# Patient Record
Sex: Male | Born: 1950 | Race: White | Hispanic: No | Marital: Married | Smoking: Current every day smoker
Health system: Southern US, Community
[De-identification: ages and names within clinical notes are randomized; demographics above are authoritative.]

## PROBLEM LIST (undated history)

## (undated) DIAGNOSIS — Z8679 Personal history of other diseases of the circulatory system: Secondary | ICD-10-CM

## (undated) DIAGNOSIS — Z95820 Peripheral vascular angioplasty status with implants and grafts: Secondary | ICD-10-CM

## (undated) DIAGNOSIS — N400 Enlarged prostate without lower urinary tract symptoms: Secondary | ICD-10-CM

## (undated) DIAGNOSIS — T4145XA Adverse effect of unspecified anesthetic, initial encounter: Secondary | ICD-10-CM

## (undated) DIAGNOSIS — C7931 Secondary malignant neoplasm of brain: Secondary | ICD-10-CM

## (undated) DIAGNOSIS — Z923 Personal history of irradiation: Secondary | ICD-10-CM

## (undated) DIAGNOSIS — I639 Cerebral infarction, unspecified: Secondary | ICD-10-CM

## (undated) DIAGNOSIS — D510 Vitamin B12 deficiency anemia due to intrinsic factor deficiency: Secondary | ICD-10-CM

## (undated) DIAGNOSIS — C3490 Malignant neoplasm of unspecified part of unspecified bronchus or lung: Secondary | ICD-10-CM

## (undated) DIAGNOSIS — N529 Male erectile dysfunction, unspecified: Secondary | ICD-10-CM

## (undated) DIAGNOSIS — E785 Hyperlipidemia, unspecified: Secondary | ICD-10-CM

## (undated) DIAGNOSIS — I739 Peripheral vascular disease, unspecified: Secondary | ICD-10-CM

## (undated) DIAGNOSIS — R0602 Shortness of breath: Secondary | ICD-10-CM

## (undated) DIAGNOSIS — J189 Pneumonia, unspecified organism: Secondary | ICD-10-CM

## (undated) DIAGNOSIS — J449 Chronic obstructive pulmonary disease, unspecified: Secondary | ICD-10-CM

## (undated) DIAGNOSIS — I10 Essential (primary) hypertension: Secondary | ICD-10-CM

## (undated) DIAGNOSIS — E349 Endocrine disorder, unspecified: Secondary | ICD-10-CM

## (undated) DIAGNOSIS — T8859XA Other complications of anesthesia, initial encounter: Secondary | ICD-10-CM

## (undated) HISTORY — DX: Personal history of other diseases of the circulatory system: Z86.79

## (undated) HISTORY — DX: Male erectile dysfunction, unspecified: N52.9

## (undated) HISTORY — DX: Chronic obstructive pulmonary disease, unspecified: J44.9

## (undated) HISTORY — DX: Hyperlipidemia, unspecified: E78.5

## (undated) HISTORY — PX: CARDIAC CATHETERIZATION: SHX172

## (undated) HISTORY — DX: Endocrine disorder, unspecified: E34.9

## (undated) HISTORY — PX: PERIPHERAL ARTERIAL STENT GRAFT: SHX2220

## (undated) HISTORY — DX: Benign prostatic hyperplasia without lower urinary tract symptoms: N40.0

## (undated) HISTORY — DX: Essential (primary) hypertension: I10

## (undated) HISTORY — PX: COLONOSCOPY: SHX174

---

## 1978-09-17 HISTORY — PX: CERVICAL FUSION: SHX112

## 1994-01-16 DIAGNOSIS — I639 Cerebral infarction, unspecified: Secondary | ICD-10-CM

## 1994-01-16 HISTORY — DX: Cerebral infarction, unspecified: I63.9

## 2003-06-05 ENCOUNTER — Ambulatory Visit (HOSPITAL_COMMUNITY): Admission: RE | Admit: 2003-06-05 | Discharge: 2003-06-05 | Payer: Self-pay | Admitting: Neurology

## 2004-11-22 ENCOUNTER — Encounter: Admission: RE | Admit: 2004-11-22 | Discharge: 2004-11-22 | Payer: Self-pay | Admitting: Family Medicine

## 2004-12-01 ENCOUNTER — Encounter: Payer: Self-pay | Admitting: Family Medicine

## 2005-01-03 ENCOUNTER — Ambulatory Visit: Admission: RE | Admit: 2005-01-03 | Discharge: 2005-01-03 | Payer: Self-pay | Admitting: Interventional Radiology

## 2005-01-16 HISTORY — PX: ANEURYSM COILING: SHX5349

## 2005-02-16 ENCOUNTER — Ambulatory Visit: Payer: Self-pay | Admitting: Pulmonary Disease

## 2005-02-16 ENCOUNTER — Ambulatory Visit (HOSPITAL_COMMUNITY): Admission: RE | Admit: 2005-02-16 | Discharge: 2005-02-17 | Payer: Self-pay | Admitting: Interventional Radiology

## 2005-03-02 ENCOUNTER — Encounter: Payer: Self-pay | Admitting: Interventional Radiology

## 2005-08-11 ENCOUNTER — Ambulatory Visit (HOSPITAL_COMMUNITY): Admission: RE | Admit: 2005-08-11 | Discharge: 2005-08-11 | Payer: Self-pay | Admitting: Interventional Radiology

## 2006-05-17 ENCOUNTER — Emergency Department (HOSPITAL_COMMUNITY): Admission: EM | Admit: 2006-05-17 | Discharge: 2006-05-17 | Payer: Self-pay | Admitting: Emergency Medicine

## 2006-06-08 ENCOUNTER — Inpatient Hospital Stay (HOSPITAL_COMMUNITY): Admission: EM | Admit: 2006-06-08 | Discharge: 2006-06-10 | Payer: Self-pay | Admitting: Emergency Medicine

## 2006-07-26 ENCOUNTER — Emergency Department (HOSPITAL_COMMUNITY): Admission: EM | Admit: 2006-07-26 | Discharge: 2006-07-26 | Payer: Self-pay | Admitting: Emergency Medicine

## 2006-08-13 ENCOUNTER — Emergency Department (HOSPITAL_COMMUNITY): Admission: EM | Admit: 2006-08-13 | Discharge: 2006-08-13 | Payer: Self-pay | Admitting: Emergency Medicine

## 2006-10-08 ENCOUNTER — Emergency Department (HOSPITAL_COMMUNITY): Admission: EM | Admit: 2006-10-08 | Discharge: 2006-10-08 | Payer: Self-pay | Admitting: Emergency Medicine

## 2006-10-08 ENCOUNTER — Encounter (INDEPENDENT_AMBULATORY_CARE_PROVIDER_SITE_OTHER): Payer: Self-pay | Admitting: Emergency Medicine

## 2007-10-27 ENCOUNTER — Emergency Department (HOSPITAL_COMMUNITY): Admission: EM | Admit: 2007-10-27 | Discharge: 2007-10-27 | Payer: Self-pay | Admitting: Emergency Medicine

## 2009-08-20 ENCOUNTER — Encounter: Payer: Self-pay | Admitting: Internal Medicine

## 2009-09-09 ENCOUNTER — Ambulatory Visit: Payer: Self-pay | Admitting: Oncology

## 2009-09-14 ENCOUNTER — Ambulatory Visit (HOSPITAL_COMMUNITY): Admission: RE | Admit: 2009-09-14 | Discharge: 2009-09-14 | Payer: Self-pay | Admitting: Oncology

## 2009-09-14 LAB — CBC WITH DIFFERENTIAL/PLATELET
BASO%: 2 % (ref 0.0–2.0)
Basophils Absolute: 0.2 10*3/uL — ABNORMAL HIGH (ref 0.0–0.1)
EOS%: 6.5 % (ref 0.0–7.0)
Eosinophils Absolute: 0.5 10*3/uL (ref 0.0–0.5)
HCT: 44.7 % (ref 38.4–49.9)
HGB: 14.8 g/dL (ref 13.0–17.1)
LYMPH%: 34.2 % (ref 14.0–49.0)
MCH: 30.7 pg (ref 27.2–33.4)
MCHC: 33.1 g/dL (ref 32.0–36.0)
MCV: 92.5 fL (ref 79.3–98.0)
MONO#: 0.6 10*3/uL (ref 0.1–0.9)
MONO%: 8.4 % (ref 0.0–14.0)
NEUT#: 3.7 10*3/uL (ref 1.5–6.5)
NEUT%: 48.9 % (ref 39.0–75.0)
Platelets: 492 10*3/uL — ABNORMAL HIGH (ref 140–400)
RBC: 4.83 10*6/uL (ref 4.20–5.82)
RDW: 14.9 % — ABNORMAL HIGH (ref 11.0–14.6)
WBC: 7.5 10*3/uL (ref 4.0–10.3)
lymph#: 2.6 10*3/uL (ref 0.9–3.3)

## 2009-09-14 LAB — COMPREHENSIVE METABOLIC PANEL
ALT: <8 U/L (ref 0–53)
AST: 12 U/L (ref 0–37)
Calcium: 8.3 mg/dL — ABNORMAL LOW (ref 8.4–10.5)
Chloride: 100 mEq/L (ref 96–112)
Creatinine, Ser: 1.44 mg/dL (ref 0.40–1.50)
Potassium: 5 mEq/L (ref 3.5–5.3)

## 2009-09-14 LAB — CHCC SMEAR

## 2009-09-17 LAB — JAK-2 V617F

## 2009-11-12 ENCOUNTER — Ambulatory Visit: Payer: Self-pay | Admitting: Oncology

## 2009-11-16 LAB — CBC WITH DIFFERENTIAL/PLATELET
BASO%: 2.2 % — ABNORMAL HIGH (ref 0.0–2.0)
EOS%: 7.9 % — ABNORMAL HIGH (ref 0.0–7.0)
HCT: 44 % (ref 38.4–49.9)
LYMPH%: 29.7 % (ref 14.0–49.0)
MCH: 30.9 pg (ref 27.2–33.4)
MCHC: 33.8 g/dL (ref 32.0–36.0)
MONO%: 10.7 % (ref 0.0–14.0)
NEUT%: 49.5 % (ref 39.0–75.0)
lymph#: 2.3 10*3/uL (ref 0.9–3.3)

## 2009-12-24 ENCOUNTER — Ambulatory Visit: Payer: Self-pay | Admitting: Internal Medicine

## 2009-12-24 DIAGNOSIS — J449 Chronic obstructive pulmonary disease, unspecified: Secondary | ICD-10-CM

## 2009-12-24 DIAGNOSIS — N400 Enlarged prostate without lower urinary tract symptoms: Secondary | ICD-10-CM

## 2009-12-24 DIAGNOSIS — J4489 Other specified chronic obstructive pulmonary disease: Secondary | ICD-10-CM

## 2009-12-24 DIAGNOSIS — E785 Hyperlipidemia, unspecified: Secondary | ICD-10-CM

## 2009-12-24 DIAGNOSIS — I10 Essential (primary) hypertension: Secondary | ICD-10-CM

## 2009-12-24 DIAGNOSIS — Z8679 Personal history of other diseases of the circulatory system: Secondary | ICD-10-CM

## 2009-12-24 HISTORY — DX: Personal history of other diseases of the circulatory system: Z86.79

## 2009-12-24 HISTORY — DX: Hyperlipidemia, unspecified: E78.5

## 2009-12-24 HISTORY — DX: Chronic obstructive pulmonary disease, unspecified: J44.9

## 2009-12-24 HISTORY — DX: Essential (primary) hypertension: I10

## 2009-12-24 HISTORY — DX: Benign prostatic hyperplasia without lower urinary tract symptoms: N40.0

## 2009-12-24 HISTORY — DX: Other specified chronic obstructive pulmonary disease: J44.89

## 2009-12-28 ENCOUNTER — Encounter: Payer: Self-pay | Admitting: Internal Medicine

## 2010-02-05 ENCOUNTER — Encounter: Payer: Self-pay | Admitting: Interventional Radiology

## 2010-02-06 ENCOUNTER — Encounter: Payer: Self-pay | Admitting: Interventional Radiology

## 2010-02-15 NOTE — Assessment & Plan Note (Signed)
Summary: new to est//ccm   Vital Signs:  Patient profile:   60 year old male Height:      66 inches Weight:      152 pounds BMI:     24.62 Temp:     98.1 degrees F oral BP sitting:   90 / 60  (right arm)  Vitals Entered By: Duard Brady LPN (December 24, 2009 2:37 PM) CC: new to establish - doing ok  Is Patient Diabetic? No   CC:  new to establish - doing ok .  History of Present Illness: 60 year old patient who is seen today as this with our practice.  He is treated hypertension, dyslipidemia, and COPD.  He has a peripheral neuropathy, as well as disability from a stroke in 1996.  He has chronic right-sided weakness and paresthesias.  Here for Medicare AWV:  1.   Risk factors based on Past M, S, F history:  cardiovascular risk factors include hypertension, and dyslipidemia 2.   Physical Activities: patient is disabled, but has reloadable physical limitations.  He has chronic right-sided mild hemiparesis 3.   Depression/mood: no history of depression or mood disorder 4.   Hearing: no deficits 5.   ADL's: independent in all aspects of daily living 6.   Fall Risk: low 7.   Home Safety: no problems identified 8.   Height, weight, &visual acuity:height and weight are stable.  No change in visual acuity 9.   Counseling: heart healthy diet.  Encouraged 10.   Labs ordered based on risk factors: laboratory studies will be checked fasting.  Next office visit 11.           Referral Coordination- follow-up hematology for his reactive  thrombocytosis 12.           Care Plan-more frequent and aggressive exercise regimen encouraged 13.            Cognitive Assessment- alert and oriented normal affect.  No memory disturbance handles all executive functioning without difficulty   Preventive Screening-Counseling & Management  Alcohol-Tobacco     Smoking Status: current  Allergies (verified): No Known Drug Allergies  Past History:  Past Medical History: COPD Hyperlipidemia  (hypertriglyceridemia) Hypertension Benign prostatic hypertrophy Cerebrovascular accident, hx of 1996 with mild residual right-sided symptoms history of cerebral aneurysm, status post coiling 2007 peripheral neuropathy testosterone deficiency/ED   pernicious anemia history of orthostatic hypotension history of peripheral vascular disease essential thrombocythemia  Past Surgical History: cervical fusion stenting and coiling of a cerebral aneurysm colonoscopy 2010   Family History: Reviewed history and no changes required. father died of stomach cancer one brother died of a 10.  Metastatic cancer, unclear primary  Social History: Reviewed history and no changes required. Married Current Smoker 11 th grade education disabled since 50 married two sons, one daughter no alcohol useSmoking Status:  current  Review of Systems  The patient denies anorexia, fever, weight loss, weight gain, vision loss, decreased hearing, hoarseness, chest pain, syncope, dyspnea on exertion, peripheral edema, prolonged cough, headaches, hemoptysis, abdominal pain, melena, hematochezia, severe indigestion/heartburn, hematuria, incontinence, genital sores, muscle weakness, suspicious skin lesions, transient blindness, difficulty walking, depression, unusual weight change, abnormal bleeding, enlarged lymph nodes, angioedema, breast masses, and testicular masses.    Physical Exam  General:  Well-developed,well-nourished,in no acute distress; alert,appropriate and cooperative throughout examination; blood pressure 100/70 Head:  Normocephalic and atraumatic without obvious abnormalities. No apparent alopecia or balding. Eyes:  No corneal or conjunctival inflammation noted. EOMI. Perrla. Funduscopic exam benign, without hemorrhages,  exudates or papilledema. Vision grossly normal. Ears:  External ear exam shows no significant lesions or deformities.  Otoscopic examination reveals clear canals, tympanic  membranes are intact bilaterally without bulging, retraction, inflammation or discharge. Hearing is grossly normal bilaterally. Nose:  External nasal examination shows no deformity or inflammation. Nasal mucosa are pink and moist without lesions or exudates. Mouth:  Oral mucosa and oropharynx without lesions or exudates.  Teeth in good repair. Neck:  No deformities, masses, or tenderness noted. Chest Wall:  No deformities, masses, tenderness or gynecomastia noted. Breasts:  No masses or gynecomastia noted Lungs:  Normal respiratory effort, chest expands symmetrically. Lungs are clear to auscultation, no crackles or wheezes. Heart:  Normal rate and regular rhythm. S1 and S2 normal without gallop, murmur, click, rub or other extra sounds. Abdomen:  Bowel sounds positive,abdomen soft and non-tender without masses, organomegaly or hernias noted. Genitalia:  Testes bilaterally descended without nodularity, tenderness or masses. No scrotal masses or lesions. No penis lesions or urethral discharge. Msk:  No deformity or scoliosis noted of thoracic or lumbar spine.   Pulses:  R and L carotid,radial,femoral,dorsalis pedis and posterior tibial pulses are full and equal bilaterally Extremities:  No clubbing, cyanosis, edema, or deformity noted with normal full range of motion of all joints.   Neurologic:  mild right-sided and weakness, and hypesthesia  Skin:  Intact without suspicious lesions or rashes Cervical Nodes:  No lymphadenopathy noted Axillary Nodes:  No palpable lymphadenopathy Inguinal Nodes:  No significant adenopathy Psych:  Cognition and judgment appear intact. Alert and cooperative with normal attention span and concentration. No apparent delusions, illusions, hallucinations   Impression & Recommendations:  Problem # 1:  Preventive Health Care (ICD-V70.0)  Complete Medication List: 1)  Aspirin 325 Mg Tabs (Aspirin) .... Qd 2)  Naproxen 500 Mg Tabs (Naproxen) .... Bid 3)  Tamsulosin  Hcl 0.4 Mg Caps (Tamsulosin hcl) .... Qd 4)  Nortriptyline Hcl 75 Mg Caps (Nortriptyline hcl) .... Qd 5)  Gabapentin 600 Mg Tabs (Gabapentin) .... Qd 6)  Lisinopril-hydrochlorothiazide 20-12.5 Mg Tabs (Lisinopril-hydrochlorothiazide) .... One daily  Patient Instructions: 1)  Please schedule a follow-up appointment in 6 months. 2)  Limit your Sodium (Salt). 3)  It is important that you exercise regularly at least 20 minutes 5 times a week. If you develop chest pain, have severe difficulty breathing, or feel very tired , stop exercising immediately and seek medical attention. Prescriptions: LISINOPRIL-HYDROCHLOROTHIAZIDE 20-12.5 MG TABS (LISINOPRIL-HYDROCHLOROTHIAZIDE) one daily  #90 x 6   Entered and Authorized by:   Gordy Savers  MD   Signed by:   Gordy Savers  MD on 12/24/2009   Method used:   Print then Give to Patient   RxID:   [id number] GABAPENTIN 600 MG TABS (GABAPENTIN) qd  #90 x 6   Entered and Authorized by:   Gordy Savers  MD   Signed by:   Gordy Savers  MD on 12/24/2009   Method used:   Print then Give to Patient   RxID:   [id number] NORTRIPTYLINE HCL 75 MG CAPS (NORTRIPTYLINE HCL) qd  #90 x 6   Entered and Authorized by:   Gordy Savers  MD   Signed by:   Gordy Savers  MD on 12/24/2009   Method used:   Print then Give to Patient   RxID:   [id number] TAMSULOSIN HCL 0.4 MG CAPS (TAMSULOSIN HCL) qd  #90 x 6   Entered and Authorized by:   Gordy Savers  MD   Signed by:   Gordy Savers  MD on 12/24/2009   Method used:   Print then Give to Patient   RxID:   [id number] NAPROXEN 500 MG TABS (NAPROXEN) bid  #180 x 6   Entered and Authorized by:   Gordy Savers  MD   Signed by:   Gordy Savers  MD on 12/24/2009   Method used:   Print then Give to Patient   RxID:   [id number]    Orders Added: 1)  New Patient 40-64 years [99386] 2)  New Patient Level III [84696]

## 2010-02-17 NOTE — Letter (Signed)
Summary: Records from [facility] 2008 - 2011  Records from [facility] 2008 - 2011   Imported By: Maryln Gottron 12/30/2009 09:52:12  _____________________________________________________________________  External Attachment:    Type:   Image     Comment:   External Document

## 2010-02-17 NOTE — Letter (Signed)
Summary: PATIENT HX FORMS  PATIENT HX FORMS   Imported By: Georgian Co 12/28/2009 14:07:43  _____________________________________________________________________  External Attachment:    Type:   Image     Comment:   External Document

## 2010-04-19 ENCOUNTER — Other Ambulatory Visit: Payer: Self-pay | Admitting: Oncology

## 2010-04-19 ENCOUNTER — Encounter (HOSPITAL_BASED_OUTPATIENT_CLINIC_OR_DEPARTMENT_OTHER): Payer: Medicare Other | Admitting: Oncology

## 2010-04-19 DIAGNOSIS — E785 Hyperlipidemia, unspecified: Secondary | ICD-10-CM

## 2010-04-19 DIAGNOSIS — D473 Essential (hemorrhagic) thrombocythemia: Secondary | ICD-10-CM

## 2010-04-19 DIAGNOSIS — Z801 Family history of malignant neoplasm of trachea, bronchus and lung: Secondary | ICD-10-CM

## 2010-04-19 DIAGNOSIS — I10 Essential (primary) hypertension: Secondary | ICD-10-CM

## 2010-04-19 LAB — CBC WITH DIFFERENTIAL/PLATELET
BASO%: 1.6 % (ref 0.0–2.0)
EOS%: 5.7 % (ref 0.0–7.0)
MCH: 31.2 pg (ref 27.2–33.4)
MCHC: 34.3 g/dL (ref 32.0–36.0)
RBC: 4.78 10*6/uL (ref 4.20–5.82)
RDW: 14.4 % (ref 11.0–14.6)
lymph#: 1.2 10*3/uL (ref 0.9–3.3)

## 2010-04-19 LAB — COMPREHENSIVE METABOLIC PANEL
ALT: 10 U/L (ref 0–53)
AST: 11 U/L (ref 0–37)
Albumin: 4.3 g/dL (ref 3.5–5.2)
Calcium: 9.1 mg/dL (ref 8.4–10.5)
Chloride: 94 mEq/L — ABNORMAL LOW (ref 96–112)
Potassium: 4.2 mEq/L (ref 3.5–5.3)

## 2010-05-11 ENCOUNTER — Ambulatory Visit (INDEPENDENT_AMBULATORY_CARE_PROVIDER_SITE_OTHER): Payer: Medicare Other | Admitting: Internal Medicine

## 2010-05-11 ENCOUNTER — Encounter: Payer: Self-pay | Admitting: Internal Medicine

## 2010-05-11 VITALS — BP 120/70 | Ht 66.0 in | Wt 149.0 lb

## 2010-05-11 DIAGNOSIS — R300 Dysuria: Secondary | ICD-10-CM

## 2010-05-11 DIAGNOSIS — I10 Essential (primary) hypertension: Secondary | ICD-10-CM

## 2010-05-11 DIAGNOSIS — N390 Urinary tract infection, site not specified: Secondary | ICD-10-CM

## 2010-05-11 DIAGNOSIS — J449 Chronic obstructive pulmonary disease, unspecified: Secondary | ICD-10-CM

## 2010-05-11 LAB — POCT URINALYSIS DIPSTICK
Bilirubin, UA: NEGATIVE
Glucose, UA: NEGATIVE
Ketones, UA: NEGATIVE
Spec Grav, UA: 1.015
Urobilinogen, UA: 0.2

## 2010-05-11 MED ORDER — CIPROFLOXACIN HCL 500 MG PO TABS: 500.0000 mg | ORAL_TABLET | Freq: Two times a day (BID) | ORAL | Status: AC

## 2010-05-11 NOTE — Patient Instructions (Signed)
Get plenty of rest, Drink lots of  clear liquids, and use Tylenol or ibuprofen for fever and discomfort.    Antibiotic therapy was prescribed for the child due to specific medical indications.  Call or return to clinic prn if these symptoms worsen or fail to improve as anticipated.  

## 2010-05-11 NOTE — Progress Notes (Signed)
  Subjective:    Patient ID: [name], male    DOB: 1950/08/04, 60 y.o.   MRN: [id number]  HPI  60 year old patient who is seen today with a four-day history of dysuria. He was hospitalized 4 years ago for a UTI and sepsis syndrome. He has treated hypertension which has been stable. He also has a history of mild dyslipidemia and COPD. He has had some URI symptoms with some head and chest congestion. No fever chills flank pain   Review of Systems  Constitutional: Positive for fatigue. Negative for fever, chills and appetite change.  HENT: Positive for congestion and rhinorrhea. Negative for hearing loss, ear pain, sore throat, trouble swallowing, neck stiffness, dental problem, voice change and tinnitus.   Eyes: Negative for pain, discharge and visual disturbance.  Respiratory: Negative for cough, chest tightness, wheezing and stridor.   Cardiovascular: Negative for chest pain, palpitations and leg swelling.  Gastrointestinal: Negative for nausea, vomiting, abdominal pain, diarrhea, constipation, blood in stool and abdominal distention.  Genitourinary: Positive for dysuria and frequency. Negative for urgency, hematuria, flank pain, discharge, difficulty urinating and genital sores.  Musculoskeletal: Negative for myalgias, back pain, joint swelling, arthralgias and gait problem.  Skin: Negative for rash.  Neurological: Negative for dizziness, syncope, speech difficulty, weakness, numbness and headaches.  Hematological: Negative for adenopathy. Does not bruise/bleed easily.  Psychiatric/Behavioral: Negative for behavioral problems and dysphoric mood. The patient is not nervous/anxious.        Objective:   Physical Exam  Constitutional: He is oriented to person, place, and time. He appears well-developed and well-nourished. No distress.  HENT:  Head: Normocephalic.  Right Ear: External ear normal.  Left Ear: External ear normal.  Eyes: Conjunctivae and EOM are normal.  Neck: Normal  range of motion.  Cardiovascular: Normal rate and normal heart sounds.   Pulmonary/Chest: Breath sounds normal.  Abdominal: Bowel sounds are normal.       No suprapubic or CVA tenderness  Musculoskeletal: Normal range of motion. He exhibits no edema and no tenderness.  Neurological: He is alert and oriented to person, place, and time.  Psychiatric: He has a normal mood and affect. His behavior is normal.          Assessment & Plan:   UTI. History of urosepsis. Will place on Cipro 500 mg twice a day for 7 days Hypertension stable COPD URI

## 2010-05-31 NOTE — Discharge Summary (Signed)
[name], [name]               ACCOUNT NO.:  [id number]   MEDICAL RECORD NO.:  [id number]          PATIENT TYPE:  INP   LOCATION:  3743                         FACILITY:  [facility]   PHYSICIAN:  Kela Millin, M.D.DATE OF BIRTH:  Jul 28, 1950   DATE OF ADMISSION:  06/07/2006  DATE OF DISCHARGE:  06/10/2006                         DISCHARGE SUMMARY - REFERRING   DISCHARGE DIAGNOSES:  1. Urinary tract infection with sepsis syndrome.  2. Chronic obstructive pulmonary disease.  3. History of hypertension.  4. History of peripheral neuropathy.  5. Hypokalemia, resolved.   HISTORY OF PRESENT ILLNESS:  The patient is a 60 year old white male  with above listed medical problems, who presented with complaints of  weakness, hematuria, and dysuria. The patient reported that he had been  seen by his primary care physician and started on Ciprofloxacin on the  day of admission for prostatitis but he noted some hematuria after he  went home and also, he was feeling very weak and so he came to the  emergency room. His blood pressure initially in the ER was 86/53 with a  pulse of 92 and his white cell count was elevated at 26.5. Urinalysis  was consistent with a urinary tract infection and he was admitted to the  [facility] for further evaluation and management.   PHYSICAL EXAMINATION:  VITAL SIGNS:  Upon admission as per Dr. Rito Ehrlich  revealed a temperature of 97 with a pulse of 92, blood pressure 86/53  initially and after IV fluids, improved to 102/50. Respiratory rate of  18. O2 saturation of 100% on room air.  HEENT:  Slight dry mucous membranes.  EXTREMITIES:  He was noted to have findings consistent with chronic  neuropathy with decreased sensation and +1 peripheral pulses. No  clubbing, cyanosis, or edema.  The rest of his physical examination was reported to be within normal  limits.   LABORATORY DATA:  White cell count 6.5 with hemoglobin of 12.5 and  hematocrit  38.1. MCV of 90, platelet count 423,000. Urinalysis as above  with large hemoglobin as well. His sodium 131, potassium of 4.8,  chloride 102, bicarb 23, BUN 15, creatinine 1.5, glucose 86.   His chest x-ray showed no acute infiltrates.   HOSPITAL COURSE BY PROBLEM:  1. URINARY TRACT INFECTION WITH SEPSIS SYNDROME:  Upon admission, the      patient was noted to be hypotensive and was bolused with IV fluids      with a good response. He was also placed on empiric antibiotics.      Urine cultures were obtained. He required further IV fluid boluses      and maintenance fluid during his hospital stay. The urine cultures      did not grow any bacteria. The patient remained afebrile during his      hospital stay. His initial white blood cell count was noted to be      26,000 and with above interventions, his leukocytosis resolved. His      last white cell count, prior to discharge today, is 7.7. His      symptoms have resolved. No  further hematuria. He has remained      hemodynamically stable. His last blood pressure was noted to be      119/73. His is tolerating p.o. well. He will be discharged on oral      antibiotics and he is to followup with his primary care physician      in the next week.  2. History of hypertension. The patient was noted to be hypotensive      upon admission and as already discussion above, responded well to      IV fluids and antibiotics as above. The patient had been on      Lisinopril HCT as well as Atenolol prior to admission. He reported      that even prior to this illness, he had had periods of hypotension      on his antihypertensives. His Prinzide as well as Atenolol were      held during his hospital stay secondary to the hypotension. With      improvement of his blood pressures, he has been instructed to      resume his Lisinopril 1/2 tablet until he follows up with his      primary care physician. He has also been instructed to hold off his      Atenolol  until he follows up with his primary care physician, the      hydrochlorothiazide was discontinued.  3. HYPOKALEMIA:  The patient's potassium was replaced while in the      hospital.  4. CHRONIC OBSTRUCTIVE PULMONARY DISEASE:  Stable. He was maintained      on his Combivent during his hospital stay.  5. HISTORY OF PERIPHERAL NEUROPATHY:  The patient was maintained on      his Neurontin and Nortriptyline during his hospital stay.   DISCHARGE MEDICATIONS:  1. Ciprofloxacin 1 p.o. b.i.d. for 10 days.  2. The patient to continue Neurontin, Nortriptyline, aspirin,      Combivent, Naprosyn p.r.n.  3. The patient to HOLD off Atenolol and HCTZ as instructed above. To      followup with his primary care physician and the Lisinopril changed      to 1/2 tablet daily (10 mg as above.)   FOLLOWUP:  Dr. Nicholos Johns in the next week.   CONDITION ON DISCHARGE:  Improved and stable.      Kela Millin, M.D.  Electronically Signed     ACV/MEDQ  D:  06/10/2006  T:  06/10/2006  Job:  [id number]   cc:   Molly Maduro A. Nicholos Johns, M.D.

## 2010-05-31 NOTE — H&P (Signed)
[name], [name] NO.:  [id number]   MEDICAL RECORD NO.:  [id number]          PATIENT TYPE:  INP   LOCATION:  3743                         FACILITY:  [facility]   PHYSICIAN:  Hollice Espy, M.D.DATE OF BIRTH:  03/26/1950   DATE OF ADMISSION:  06/07/2006  DATE OF DISCHARGE:                              HISTORY & PHYSICAL   PRIMARY CARE PHYSICIAN:  Robert A. Nicholos Johns, M.D.   CHIEF COMPLAINT:  Weakness and hematuria, dysuria.   HISTORY OF PRESENT ILLNESS:  The patient is a 60 year old white male  with past medical history of hypertension, COPD and BPH/prostatitis who  presents was started today on outpatient Cipro. Since then he has noted  hematuria as well as feeling very weak.  He came into the emergency room  and initially his blood pressure was found to be 86/53 with a heart rate  of 92.  His white count was concerning with a white count of 26.5 with  shift.  The patient on urinalysis had a large leukocyte esterase with  too-numerous-to-count wbc's.  He was started on IV fluids and felt to  have a significant UTI versus prostatitis.  Currently he is doing well.  He denies any headaches, vision changes, dysphasia, chest pain,  palpitations, shortness of breath, wheezing, coughing.  No abdominal  pain.  He noted some hematuria earlier as well as some mild dysuria.  No  constipation or diarrhea.  He has chronic right-sided upper and lower  extremity numbness from an old history of stroke as well as a left lower  extremity numbness from the knee down secondary to a nerve damage.  His  review of systems is otherwise negative.   PAST MEDICAL HISTORY:  1. History of CVA.  2. 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  3. Hypertension.  4. COPD.  5. Tobacco use.  6. Prostatitis and BPH.   MEDICATIONS:  1. He is on Naprosyn 500 mg p.o. b.i.d. p.r.n.  2. Viagra p.r.n.  3. Atenolol 50 mg p.o. daily.  4. Combivent inhaler p.r.n.  5. Neurontin 600 mg p.o.  b.i.d.  6. Nortriptyline 50 mg p.o. at bedtime.  7. He just started Cipro 500 mg p.o. b.i.d. starting today.  8. Aspirin 325 mg p.o. daily.  9. Lisinopril 20/25 one p.o. daily.   ALLERGIES:  No known drug allergies.   SOCIAL HISTORY:  He smoked about a half to quarter pack of cigarettes a  day.  Denies any alcohol or drug use.   FAMILY HISTORY:  Noncontributory.   PHYSICAL EXAMINATION:  VITAL SIGNS:  On admission, temperature 97, heart  rate 92, blood pressure initially 86/53.  After receiving IV fluids, his  blood pressure has improved to 102/50, respirations 18, oxygen  saturation 100% on room air.  GENERAL:  He is alert and oriented x3, in no apparent distress.  HEENT:  Normocephalic, atraumatic.  Mucous membranes are slightly dry.  He has no carotid bruits.  HEART:  Regular rate and rhythm.  S1 and S2.  LUNGS:  Decreased breath sounds throughout.  ABDOMEN:  Soft, nontender, nondistended.  Positive bowel  sounds.  EXTREMITIES:  No clubbing, cyanosis or edema.  He does have evidence of  chronic neuropathy with decreased sensation, 1+ peripheral pulses.   LABORATORY DATA:  White count 6.5, hemoglobin 12.7, hematocrit 38.1, MCV  90, platelet count 423, 85% neutrophils. Urinalysis shows large  hemoglobin, total protein 30, leukocyte esterase and too-numerous-to-  count white cells, 21-50 red cells, few bacteria. Sodium 131, potassium  4.8, chloride 102, bicarb 23, BUN 15, creatinine 1.5, glucose 86.  LFTs  shows some slight elevation and transaminases.   ASSESSMENT/PLAN:  1. Urinary tract infection versus prostatitis with signs of early      systemic response.  Will favor putting him on IV Cipro 400 mg      q.12h.  2. Hypertension secondary to #1.  Put him on IV fluids.  3. Peripheral neuropathy.  Continue Neurontin and nortriptyline.  4. Hypertension with hypotensive episodes.  Will hold on his blood      pressure medications.  5. Chronic obstructive pulmonary disease, stable.   The patient has      declined a nicotine patch.      Hollice Espy, M.D.  Electronically Signed     SKK/MEDQ  D:  06/08/2006  T:  06/08/2006  Job:  [id number]
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
## 2010-06-24 ENCOUNTER — Ambulatory Visit: Payer: Self-pay | Admitting: Internal Medicine

## 2010-07-29 ENCOUNTER — Encounter: Payer: Self-pay | Admitting: Internal Medicine

## 2010-07-29 ENCOUNTER — Ambulatory Visit (INDEPENDENT_AMBULATORY_CARE_PROVIDER_SITE_OTHER): Payer: Medicare Other | Admitting: Internal Medicine

## 2010-07-29 DIAGNOSIS — J449 Chronic obstructive pulmonary disease, unspecified: Secondary | ICD-10-CM

## 2010-07-29 DIAGNOSIS — I10 Essential (primary) hypertension: Secondary | ICD-10-CM

## 2010-07-29 DIAGNOSIS — Z8679 Personal history of other diseases of the circulatory system: Secondary | ICD-10-CM

## 2010-07-29 MED ORDER — LISINOPRIL 20 MG PO TABS: 20.0000 mg | ORAL_TABLET | Freq: Every day | ORAL | Status: DC

## 2010-07-29 NOTE — Progress Notes (Signed)
  Subjective:    Patient ID: [name], male    DOB: [birth year], 60 y.o.   MRN: [id number]  HPI  60 year old patient who is seen today for followup. He is followed by [facility] for left leg claudication. He was seen by Dr. Gery Pray yesterday. He is asking for [facility] form completion due to remote stroke in 1996. He has some very mild residual right-sided dysesthesia only but he has been disabled since that time. He has treated hypertension mild dyslipidemia and COPD. He has ongoing tobacco use.    Review of Systems  Constitutional: Negative for fever, chills, appetite change and fatigue.  HENT: Negative for hearing loss, ear pain, congestion, sore throat, trouble swallowing, neck stiffness, dental problem, voice change and tinnitus.   Eyes: Negative for pain, discharge and visual disturbance.  Respiratory: Negative for cough, chest tightness, wheezing and stridor.   Cardiovascular: Negative for chest pain, palpitations and leg swelling.  Gastrointestinal: Negative for nausea, vomiting, abdominal pain, diarrhea, constipation, blood in stool and abdominal distention.  Genitourinary: Negative for urgency, hematuria, flank pain, discharge, difficulty urinating and genital sores.  Musculoskeletal: Negative for myalgias, back pain, joint swelling, arthralgias and gait problem.  Skin: Negative for rash.  Neurological: Positive for numbness. Negative for dizziness, syncope, speech difficulty, weakness and headaches.  Hematological: Negative for adenopathy. Does not bruise/bleed easily.  Psychiatric/Behavioral: Negative for behavioral problems and dysphoric mood. The patient is not nervous/anxious.        Objective:   Physical Exam  Constitutional: He is oriented to person, place, and time. He appears well-developed.  HENT:  Head: Normocephalic.  Right Ear: External ear normal.  Left Ear: External ear normal.       His acuity 20/20 in both eyes  Eyes: Conjunctivae and  EOM are normal.  Neck: Normal range of motion.  Cardiovascular: Normal rate and normal heart sounds.        Repeat blood pressure 110/70  Pulmonary/Chest: Breath sounds normal.  Abdominal: Bowel sounds are normal.  Musculoskeletal: Normal range of motion. He exhibits no edema and no tenderness.  Neurological: He is alert and oriented to person, place, and time.       No drift with outstretched arms Finger to nose testing performed normally bilaterally  No change in facial sensation Subjective decrease sensation right arm  Psychiatric: He has a normal mood and affect. His behavior is normal.          Assessment & Plan:   Hypertension. Well controlled he has had similar slight abdomen ultrasound diuretic therapy we'll attempt to control on lisinopril alone and when reevaluated in 3 months we'll check followup electrolytes History of left leg claudication. Evaluation in progress through [facility] cardiology COPD Ongoing tobacco use History of cerebral vascular disease with mild persistent right-sided dysesthesia  DMV forms completed Blood pressure medication changed to lisinopril 20 Recheck 3 months Total cessation of smoking encouraged

## 2010-07-29 NOTE — Patient Instructions (Addendum)
Limit your sodium (Salt) intake  Return in 3 months for follow-up    It is important that you exercise regularly, at least 20 minutes 3 to 4 times per week.  If you develop chest pain or shortness of breath seek  medical attention.  Smoking tobacco is very bad for your health. You should stop smoking immediately.

## 2010-10-06 ENCOUNTER — Other Ambulatory Visit: Payer: Self-pay | Admitting: Cardiovascular Disease

## 2010-10-06 ENCOUNTER — Ambulatory Visit
Admission: RE | Admit: 2010-10-06 | Discharge: 2010-10-06 | Disposition: A | Discharge status: Home / Self Care | Payer: Medicare Other | Source: Ambulatory Visit | Attending: Cardiovascular Disease | Admitting: Cardiovascular Disease

## 2010-10-06 DIAGNOSIS — Z01811 Encounter for preprocedural respiratory examination: Secondary | ICD-10-CM

## 2010-10-13 ENCOUNTER — Ambulatory Visit (HOSPITAL_COMMUNITY)
Admission: RE | Admit: 2010-10-13 | Discharge: 2010-10-14 | Disposition: A | Discharge status: Home / Self Care | Payer: Medicare Other | Source: Ambulatory Visit | Attending: Cardiovascular Disease | Admitting: Cardiovascular Disease

## 2010-10-13 DIAGNOSIS — I70219 Atherosclerosis of native arteries of extremities with intermittent claudication, unspecified extremity: Secondary | ICD-10-CM | POA: Insufficient documentation

## 2010-10-13 DIAGNOSIS — I6990 Unspecified sequelae of unspecified cerebrovascular disease: Secondary | ICD-10-CM | POA: Insufficient documentation

## 2010-10-13 DIAGNOSIS — F17200 Nicotine dependence, unspecified, uncomplicated: Secondary | ICD-10-CM | POA: Insufficient documentation

## 2010-10-13 DIAGNOSIS — J4489 Other specified chronic obstructive pulmonary disease: Secondary | ICD-10-CM | POA: Insufficient documentation

## 2010-10-13 DIAGNOSIS — I10 Essential (primary) hypertension: Secondary | ICD-10-CM | POA: Insufficient documentation

## 2010-10-13 DIAGNOSIS — J449 Chronic obstructive pulmonary disease, unspecified: Secondary | ICD-10-CM | POA: Insufficient documentation

## 2010-10-14 LAB — CBC
Hemoglobin: 14 g/dL (ref 13.0–17.0)
MCH: 30.5 pg (ref 26.0–34.0)
MCV: 90.2 fL (ref 78.0–100.0)
RBC: 4.59 MIL/uL (ref 4.22–5.81)

## 2010-10-14 LAB — BASIC METABOLIC PANEL
BUN: 8 mg/dL (ref 6–23)
CO2: 27 mEq/L (ref 19–32)
Calcium: 8.9 mg/dL (ref 8.4–10.5)
Creatinine, Ser: 0.76 mg/dL (ref 0.50–1.35)
Glucose, Bld: 139 mg/dL — ABNORMAL HIGH (ref 70–99)

## 2010-10-16 NOTE — Procedures (Signed)
NAME:  Frank, [name] NO.:  [id number]  MEDICAL RECORD NO.:  [id number]  LOCATION:  [facility]                         FACILITY:  [facility]  PHYSICIAN:  Nanetta Batty, M.D.   DATE OF BIRTH:  10-Sep-1950  DATE OF PROCEDURE:  10/13/2010 DATE OF DISCHARGE:                   PERIPHERAL VASCULAR INVASIVE PROCEDURE   Abdominal aortogram with bifemoral runoff, TurboHawk atherectomy, PTA of left SFA.  [name] is a 60 year old thin-appearing married Caucasian male, father of three, grandfather of four grandchildren who has been on disability secondary to stroke which he suffered in 34.  He is referred to me by Dr. Geradine Girt, [facility] for PV evaluation. His risk factors include 60 pack-years of tobacco abuse, now smoking one- half pack per day.  He has treated hypertension as well.  He has COPD and has had a cerebral aneurysm clipping by Dr. Julieanne Cotton.  He has normal LV systolic function and apical thinning on Myoview.  Does complain of left greater than right lower extremity claudication. Dopplers in our office revealed progression of disease on the right with SFA disease.  He presents now for angiography, potential percutaneous intervention for lifestyle-limiting claudication.  PROCEDURE DESCRIPTION:  The patient was brought to the second floor [facility] angiographic suite in the post absorptive state.  He was premedicated with p.o. Valium, IV Versed, and fentanyl.  His right groin was prepped and shaved in the usual sterile fashion.  1% Xylocaine was used for local anesthesia.  A 5-French sheath was inserted to the right femoral artery using standard Seldinger technique.  A 5-French pigtail catheter was used for abdominal aortography with bifemoral runoff using bolus chase digital subtraction table technique.  Visipaque dye was used throughout the entirety of the case.  Retrograde aortic pressures were monitored during the case.  ANGIOGRAPHIC  RESULTS: 1. Abdominal aorta.     a.     Renal arteries - normal.     b.     Infrarenal abdominal aorta - normal. 2. Left lower extremity.     a.     80% segmental proximal mid left SFA stenosis.     b.     One-vessel runoff to the peroneal. 3. Right lower extremity.     a.     Tandem 70% to 80% stenosis in the proximal and mid right      SFA.     b.     Two-vessel runoff via peroneal (anterior tibial).  PROCEDURE DESCRIPTION:  The patient received 6000 units of heparin intravenously with an ACT documented at 110.  Total contrast delivered to the patient was 258 mL.  A 6-mm Spider distal protection device was deployed through a 0.35 quick cross, which was delivered over a versa core wire.  Multiple cuts were obtained in the proximal mid left SFA using LSX femoral hot device with retrieval of copious amounts of white fibrous plaque.  PTA was performed with a 5 x 4 Sterling balloon at 2 atmospheres at both locations resulting in reduction of 80% proximal, mid left SFA stenosis less than 20% residual without dissection.  The filter was removed and the completion angiography was performed revealing an excellent angiographic result.  The sheath was then withdrawn  across the bifurcation and secured.  The patient left the lab in stable condition.  IMPRESSION:  Successful TurboHawk excisional atherectomy, PTA of left SFA in the proximal midportion for lifestyle-limiting claudication.  The sheath will be removed once the ACT falls below 170.  The patient will be hydrated overnight, treated with aspirin, Plavix, discharged home in the morning.  He will get followup Dopplers and ABIs and he will see me back in the office.  Dr. Tinnie Gens Petrinitz was notified of these results.     Nanetta Batty, M.D.     Cordelia Pen  D:  10/13/2010  T:  10/13/2010  Job:  [id number]  cc:   Second Floor [facility] Angiographic Suite [facility] Gordy Savers, MD Eugenio Hoes.  Petrinitz, D.P.M.  Electronically Signed by Nanetta Batty M.D. on 10/16/2010 11:52:00 AM

## 2010-10-17 LAB — POCT I-STAT, CHEM 8
Creatinine, Ser: 0.7
HCT: 45
Hemoglobin: 15.3
Potassium: 3.7
Sodium: 139

## 2010-10-17 LAB — URINALYSIS, ROUTINE W REFLEX MICROSCOPIC
Hgb urine dipstick: NEGATIVE
Nitrite: NEGATIVE
Specific Gravity, Urine: 1.033 — ABNORMAL HIGH
Urobilinogen, UA: 1

## 2010-10-17 LAB — DIFFERENTIAL
Basophils Relative: 1
Lymphs Abs: 2
Monocytes Relative: 8
Neutro Abs: 6.8
Neutrophils Relative %: 69

## 2010-10-17 LAB — CBC
HCT: 43
Hemoglobin: 14.5
MCHC: 33.7
RDW: 14.1

## 2010-10-17 LAB — COMPREHENSIVE METABOLIC PANEL
Alkaline Phosphatase: 83
BUN: 13
Calcium: 8.5
GFR calc non Af Amer: >60
Glucose, Bld: 114 — ABNORMAL HIGH
Total Protein: 6.6

## 2010-10-17 LAB — POCT CARDIAC MARKERS
CKMB, poc: <1 — ABNORMAL LOW
Myoglobin, poc: 48.4
Troponin i, poc: <0.05

## 2010-10-19 ENCOUNTER — Other Ambulatory Visit: Payer: Self-pay | Admitting: Oncology

## 2010-10-19 ENCOUNTER — Encounter (HOSPITAL_BASED_OUTPATIENT_CLINIC_OR_DEPARTMENT_OTHER): Payer: Medicare Other | Admitting: Oncology

## 2010-10-19 DIAGNOSIS — Z801 Family history of malignant neoplasm of trachea, bronchus and lung: Secondary | ICD-10-CM

## 2010-10-19 DIAGNOSIS — E785 Hyperlipidemia, unspecified: Secondary | ICD-10-CM

## 2010-10-19 DIAGNOSIS — J449 Chronic obstructive pulmonary disease, unspecified: Secondary | ICD-10-CM

## 2010-10-19 DIAGNOSIS — I10 Essential (primary) hypertension: Secondary | ICD-10-CM

## 2010-10-19 DIAGNOSIS — D473 Essential (hemorrhagic) thrombocythemia: Secondary | ICD-10-CM

## 2010-10-19 DIAGNOSIS — I739 Peripheral vascular disease, unspecified: Secondary | ICD-10-CM

## 2010-10-19 LAB — COMPREHENSIVE METABOLIC PANEL
ALT: 8 U/L (ref 0–53)
AST: 10 U/L (ref 0–37)
BUN: 9 mg/dL (ref 6–23)
Calcium: 9.1 mg/dL (ref 8.4–10.5)
Creatinine, Ser: 0.76 mg/dL (ref 0.50–1.35)
Total Bilirubin: 0.3 mg/dL (ref 0.3–1.2)

## 2010-10-19 LAB — CBC WITH DIFFERENTIAL/PLATELET
BASO%: 1.3 % (ref 0.0–2.0)
Basophils Absolute: 0.1 10*3/uL (ref 0.0–0.1)
EOS%: 6.5 % (ref 0.0–7.0)
HCT: 39.9 % (ref 38.4–49.9)
HGB: 13.7 g/dL (ref 13.0–17.1)
LYMPH%: 29.3 % (ref 14.0–49.0)
MCH: 31.6 pg (ref 27.2–33.4)
MCHC: 34.3 g/dL (ref 32.0–36.0)
MCV: 92.2 fL (ref 79.3–98.0)
NEUT%: 53 % (ref 39.0–75.0)
Platelets: 494 10*3/uL — ABNORMAL HIGH (ref 140–400)

## 2010-10-27 LAB — I-STAT 8, (EC8 V) (CONVERTED LAB)
BUN: 7
Glucose, Bld: 90
Hemoglobin: 17
Potassium: 3.9
Sodium: 138
pH, Ven: 7.345 — ABNORMAL HIGH

## 2010-10-27 LAB — CBC
HCT: 47.1
Hemoglobin: 15.7
MCV: 91.9
Platelets: 484 — ABNORMAL HIGH
RDW: 14.4 — ABNORMAL HIGH
WBC: 10.5

## 2010-10-27 LAB — POCT I-STAT CREATININE: Operator id: 151321

## 2010-10-27 LAB — DIFFERENTIAL
Eosinophils Absolute: 0.3
Eosinophils Relative: 3
Lymphocytes Relative: 24
Lymphs Abs: 2.5
Monocytes Absolute: 0.6

## 2010-10-31 ENCOUNTER — Encounter: Payer: Self-pay | Admitting: Internal Medicine

## 2010-10-31 ENCOUNTER — Ambulatory Visit (INDEPENDENT_AMBULATORY_CARE_PROVIDER_SITE_OTHER): Payer: Medicare Other | Admitting: Internal Medicine

## 2010-10-31 VITALS — BP 120/82 | Temp 97.8°F | Wt 150.0 lb

## 2010-10-31 DIAGNOSIS — F17200 Nicotine dependence, unspecified, uncomplicated: Secondary | ICD-10-CM

## 2010-10-31 DIAGNOSIS — Z23 Encounter for immunization: Secondary | ICD-10-CM

## 2010-10-31 DIAGNOSIS — Z0000 Encounter for general adult medical examination without abnormal findings: Secondary | ICD-10-CM

## 2010-10-31 DIAGNOSIS — I739 Peripheral vascular disease, unspecified: Secondary | ICD-10-CM

## 2010-10-31 DIAGNOSIS — Z720 Tobacco use: Secondary | ICD-10-CM

## 2010-10-31 DIAGNOSIS — I10 Essential (primary) hypertension: Secondary | ICD-10-CM

## 2010-10-31 NOTE — Patient Instructions (Signed)
Smoking tobacco is very bad for your health. You should stop smoking immediately.    It is important that you exercise regularly, at least 20 minutes 3 to 4 times per week.  If you develop chest pain or shortness of breath seek  medical attention.  Limit your sodium (Salt) intake  Return in 6 months for follow-up

## 2010-10-31 NOTE — Progress Notes (Signed)
  Subjective:    Patient ID: [name], male    DOB: 11/21/1950, 60 y.o.   MRN: [id number]  HPI 60 year old patient who is seen today for followup. Since his last visit here he has had a left femoral artery stent placed by [facility] cardiology due to left leg claudication. He has been pleased with his progress. He has COPD and ongoing tobacco use. He states his cigarette consumption is down to 6 per day he has a history of cerebral vascular disease. He has treated hypertension and COPD. His pulmonary status has been stable denies any cardiopulmonary complaints and his left leg claudication has resolved. Hydrochlorothiazide was discontinued at his last visit. His blood pressure remains well-controlled;  he has a history of mild hypertriglyceridemia   Review of Systems  Constitutional: Negative for fever, chills, appetite change and fatigue.  HENT: Negative for hearing loss, ear pain, congestion, sore throat, trouble swallowing, neck stiffness, dental problem, voice change and tinnitus.   Eyes: Negative for pain, discharge and visual disturbance.  Respiratory: Negative for cough, chest tightness, wheezing and stridor.   Cardiovascular: Negative for chest pain, palpitations and leg swelling.  Gastrointestinal: Negative for nausea, vomiting, abdominal pain, diarrhea, constipation, blood in stool and abdominal distention.  Genitourinary: Negative for urgency, hematuria, flank pain, discharge, difficulty urinating and genital sores.  Musculoskeletal: Negative for myalgias, back pain, joint swelling, arthralgias and gait problem.  Skin: Negative for rash.  Neurological: Negative for dizziness, syncope, speech difficulty, weakness, numbness and headaches.  Hematological: Negative for adenopathy. Does not bruise/bleed easily.  Psychiatric/Behavioral: Negative for behavioral problems and dysphoric mood. The patient is not nervous/anxious.        Objective:   Physical Exam  Constitutional: He is  oriented to person, place, and time. He appears well-developed.       Blood pressure 124/76  HENT:  Head: Normocephalic.  Right Ear: External ear normal.  Left Ear: External ear normal.  Eyes: Conjunctivae and EOM are normal.  Neck: Normal range of motion.  Cardiovascular: Normal rate and normal heart sounds.   Pulmonary/Chest: Effort normal and breath sounds normal.       Chest revealed diminished breath sounds but clear. O2 saturation 95%  Abdominal: Bowel sounds are normal.  Musculoskeletal: Normal range of motion. He exhibits no edema and no tenderness.  Neurological: He is alert and oriented to person, place, and time.  Psychiatric: He has a normal mood and affect. His behavior is normal.          Assessment & Plan:   Hypertension well controlled Peripheral vascular disease COPD and ongoing tobacco use  We'll reassess in 6 months. Statin therapy will be discussed at that time he will remain on aspirin and Plavix therapy  total cessation of total cessation of smoking encouraged

## 2010-11-10 ENCOUNTER — Ambulatory Visit: Payer: Self-pay | Admitting: Internal Medicine

## 2011-01-16 ENCOUNTER — Other Ambulatory Visit: Payer: Self-pay

## 2011-01-16 MED ORDER — TAMSULOSIN HCL 0.4 MG PO CAPS: 0.4000 mg | ORAL_CAPSULE | Freq: Every day | ORAL | Status: DC

## 2011-01-16 MED ORDER — NAPROXEN 500 MG PO TABS: 500.0000 mg | ORAL_TABLET | Freq: Two times a day (BID) | ORAL | Status: DC

## 2011-01-16 MED ORDER — GABAPENTIN 600 MG PO TABS: 600.0000 mg | ORAL_TABLET | Freq: Every day | ORAL | Status: DC

## 2011-01-16 MED ORDER — LISINOPRIL 20 MG PO TABS: 20.0000 mg | ORAL_TABLET | Freq: Every day | ORAL | Status: DC

## 2011-01-16 MED ORDER — NORTRIPTYLINE HCL 75 MG PO CAPS: 75.0000 mg | ORAL_CAPSULE | Freq: Every day | ORAL | Status: DC

## 2011-01-23 ENCOUNTER — Telehealth: Payer: Self-pay | Admitting: Internal Medicine

## 2011-01-23 MED ORDER — TAMSULOSIN HCL 0.4 MG PO CAPS: 0.4000 mg | ORAL_CAPSULE | Freq: Every day | ORAL | Status: DC

## 2011-01-23 MED ORDER — GABAPENTIN 600 MG PO TABS: 600.0000 mg | ORAL_TABLET | Freq: Every day | ORAL | Status: DC

## 2011-01-23 MED ORDER — NORTRIPTYLINE HCL 75 MG PO CAPS: 75.0000 mg | ORAL_CAPSULE | Freq: Every day | ORAL | Status: DC

## 2011-01-23 MED ORDER — LISINOPRIL 20 MG PO TABS: 20.0000 mg | ORAL_TABLET | Freq: Every day | ORAL | Status: DC

## 2011-01-23 MED ORDER — NAPROXEN 500 MG PO TABS: 500.0000 mg | ORAL_TABLET | Freq: Two times a day (BID) | ORAL | Status: DC

## 2011-01-23 NOTE — Telephone Encounter (Signed)
meds all re-escribe to [facility] with instructions that we originally escribe 01/16/11

## 2011-01-23 NOTE — Telephone Encounter (Signed)
rx refills were sent in 01/16/11 to [facility] they are saying that the never received the refill request. Pt requesting to have rx re-sent to [facility]. However script has to be in before end of December and he would have received 3 months for free. Pt is requesting when they are re-submitted that it is documented they were sent in in December  [facility] Phone # [phone]

## 2011-05-01 ENCOUNTER — Encounter: Payer: Self-pay | Admitting: Internal Medicine

## 2011-05-01 ENCOUNTER — Ambulatory Visit (INDEPENDENT_AMBULATORY_CARE_PROVIDER_SITE_OTHER): Payer: Medicare Other | Admitting: Internal Medicine

## 2011-05-01 VITALS — BP 126/80 | Temp 97.7°F | Wt 154.0 lb

## 2011-05-01 DIAGNOSIS — J449 Chronic obstructive pulmonary disease, unspecified: Secondary | ICD-10-CM

## 2011-05-01 DIAGNOSIS — J4489 Other specified chronic obstructive pulmonary disease: Secondary | ICD-10-CM

## 2011-05-01 DIAGNOSIS — I10 Essential (primary) hypertension: Secondary | ICD-10-CM

## 2011-05-01 DIAGNOSIS — I739 Peripheral vascular disease, unspecified: Secondary | ICD-10-CM

## 2011-05-01 DIAGNOSIS — F17200 Nicotine dependence, unspecified, uncomplicated: Secondary | ICD-10-CM

## 2011-05-01 DIAGNOSIS — Z720 Tobacco use: Secondary | ICD-10-CM

## 2011-05-01 DIAGNOSIS — E785 Hyperlipidemia, unspecified: Secondary | ICD-10-CM

## 2011-05-01 MED ORDER — ATORVASTATIN CALCIUM 20 MG PO TABS: 20.0000 mg | ORAL_TABLET | Freq: Every day | ORAL | Status: DC

## 2011-05-01 NOTE — Patient Instructions (Signed)
Limit your sodium (Salt) intake  Smoking tobacco is very bad for your health. You should stop smoking immediately.  Return in 6 months for follow-up   

## 2011-05-01 NOTE — Progress Notes (Signed)
  Subjective:    Patient ID: [name], male    DOB: 04-Feb-1950, 61 y.o.   MRN: [id number]  HPI  61 year old patient who is seen today for followup. He has a history of treated hypertension and COPD he does complain of worsening dyspnea on exertion. He continues to smoke about one half pack of cigarettes daily he has peripheral vascular disease and is scheduled for followup with his vascular surgeon soon. Denies any exertional chest pain he has a history of mild dyslipidemia but apparently has never been treated with statin therapy. There is no history of statin intolerance. He has a history of peripheral neuropathy BPH and also history of cerebrovascular disease. Medical regimen presently includes Plavix   Review of Systems  Constitutional: Negative for fever, chills, appetite change and fatigue.  HENT: Negative for hearing loss, ear pain, congestion, sore throat, trouble swallowing, neck stiffness, dental problem, voice change and tinnitus.   Eyes: Negative for pain, discharge and visual disturbance.  Respiratory: Positive for shortness of breath. Negative for cough, chest tightness, wheezing and stridor.   Cardiovascular: Negative for chest pain, palpitations and leg swelling.  Gastrointestinal: Negative for nausea, vomiting, abdominal pain, diarrhea, constipation, blood in stool and abdominal distention.  Genitourinary: Negative for urgency, hematuria, flank pain, discharge, difficulty urinating and genital sores.  Musculoskeletal: Positive for arthralgias. Negative for myalgias, back pain, joint swelling and gait problem.  Skin: Negative for rash.  Neurological: Positive for numbness. Negative for dizziness, syncope, speech difficulty, weakness and headaches.  Hematological: Negative for adenopathy. Does not bruise/bleed easily.  Psychiatric/Behavioral: Negative for behavioral problems and dysphoric mood. The patient is not nervous/anxious.        Objective:   Physical Exam    Constitutional: He is oriented to person, place, and time. He appears well-developed.  HENT:  Head: Normocephalic.  Right Ear: External ear normal.  Left Ear: External ear normal.  Eyes: Conjunctivae and EOM are normal.  Neck: Normal range of motion.  Cardiovascular: Normal rate and normal heart sounds.   Pulmonary/Chest: Effort normal. No respiratory distress. He has no wheezes. He has no rales.       Scattered coarse rhonchi  Abdominal: Bowel sounds are normal.  Musculoskeletal: Normal range of motion. He exhibits no edema and no tenderness.  Neurological: He is alert and oriented to person, place, and time.  Psychiatric: He has a normal mood and affect. His behavior is normal.          Assessment & Plan:  COPD. Normal oxygen saturation 96% worsening dyspnea on exertion. Total cessation of smoking encouraged Hypertension well controlled History dyslipidemia. We'll start atorvastatin 20 mg daily 80 of his history of peripheral vascular disease and cerebrovascular disease Cerebral vascular disease Peripheral vascular disease. Follow vascular surgery  CPX 6 months

## 2011-06-19 ENCOUNTER — Encounter (HOSPITAL_COMMUNITY): Payer: Self-pay | Admitting: Pharmacy Technician

## 2011-06-28 ENCOUNTER — Other Ambulatory Visit: Payer: Self-pay | Admitting: Cardiovascular Disease

## 2011-06-30 ENCOUNTER — Encounter (HOSPITAL_COMMUNITY)
Admission: RE | Disposition: A | Discharge status: Home / Self Care | Payer: Self-pay | Source: Ambulatory Visit | Attending: Cardiovascular Disease

## 2011-06-30 ENCOUNTER — Encounter (HOSPITAL_COMMUNITY): Payer: Self-pay | Admitting: Cardiology

## 2011-06-30 ENCOUNTER — Ambulatory Visit (HOSPITAL_COMMUNITY)
Admission: RE | Admit: 2011-06-30 | Discharge: 2011-07-01 | Disposition: A | Discharge status: Home / Self Care | Payer: Medicare Other | Source: Ambulatory Visit | Attending: Cardiovascular Disease | Admitting: Cardiovascular Disease

## 2011-06-30 DIAGNOSIS — Z720 Tobacco use: Secondary | ICD-10-CM | POA: Diagnosis present

## 2011-06-30 DIAGNOSIS — I70219 Atherosclerosis of native arteries of extremities with intermittent claudication, unspecified extremity: Secondary | ICD-10-CM | POA: Insufficient documentation

## 2011-06-30 DIAGNOSIS — Z95820 Peripheral vascular angioplasty status with implants and grafts: Secondary | ICD-10-CM

## 2011-06-30 DIAGNOSIS — J449 Chronic obstructive pulmonary disease, unspecified: Secondary | ICD-10-CM | POA: Diagnosis present

## 2011-06-30 DIAGNOSIS — E785 Hyperlipidemia, unspecified: Secondary | ICD-10-CM | POA: Diagnosis present

## 2011-06-30 DIAGNOSIS — J4489 Other specified chronic obstructive pulmonary disease: Secondary | ICD-10-CM | POA: Diagnosis present

## 2011-06-30 DIAGNOSIS — I10 Essential (primary) hypertension: Secondary | ICD-10-CM | POA: Diagnosis present

## 2011-06-30 DIAGNOSIS — I739 Peripheral vascular disease, unspecified: Secondary | ICD-10-CM

## 2011-06-30 DIAGNOSIS — F17200 Nicotine dependence, unspecified, uncomplicated: Secondary | ICD-10-CM | POA: Insufficient documentation

## 2011-06-30 HISTORY — PX: LOWER EXTREMITY ANGIOGRAM: SHX5508

## 2011-06-30 HISTORY — DX: Peripheral vascular angioplasty status with implants and grafts: Z95.820

## 2011-06-30 HISTORY — DX: Peripheral vascular disease, unspecified: I73.9

## 2011-06-30 HISTORY — PX: PERCUTANEOUS STENT INTERVENTION: SHX5500

## 2011-06-30 LAB — POCT ACTIVATED CLOTTING TIME
Activated Clotting Time: 234 seconds
Activated Clotting Time: 229 seconds

## 2011-06-30 SURGERY — ANGIOGRAM, LOWER EXTREMITY
Anesthesia: LOCAL

## 2011-06-30 MED ORDER — CLOPIDOGREL BISULFATE 75 MG PO TABS
75.0000 mg | ORAL_TABLET | Freq: Every day | ORAL | Status: DC
  Administered 2011-07-01: 75 mg via ORAL
  Filled 2011-06-30: qty 1

## 2011-06-30 MED ORDER — DIAZEPAM 5 MG PO TABS
5.0000 mg | ORAL_TABLET | ORAL | Status: AC
  Administered 2011-06-30: 5 mg via ORAL
  Filled 2011-06-30: qty 1

## 2011-06-30 MED ORDER — LISINOPRIL 20 MG PO TABS
20.0000 mg | ORAL_TABLET | Freq: Every day | ORAL | Status: DC
  Administered 2011-07-01: 20 mg via ORAL
  Filled 2011-06-30: qty 1

## 2011-06-30 MED ORDER — SODIUM CHLORIDE 0.9 % IV SOLN
INTRAVENOUS | Status: DC
  Administered 2011-06-30: 1000 mL via INTRAVENOUS

## 2011-06-30 MED ORDER — ONDANSETRON HCL 4 MG/2ML IJ SOLN: 4.0000 mg | Freq: Four times a day (QID) | INTRAMUSCULAR | Status: DC | PRN

## 2011-06-30 MED ORDER — MORPHINE SULFATE 2 MG/ML IJ SOLN
1.0000 mg | INTRAMUSCULAR | Status: DC | PRN
  Administered 2011-06-30: 1 mg via INTRAVENOUS
  Filled 2011-06-30: qty 1

## 2011-06-30 MED ORDER — HEPARIN (PORCINE) IN NACL 2-0.9 UNIT/ML-% IJ SOLN
INTRAMUSCULAR | Status: AC
  Filled 2011-06-30: qty 1000

## 2011-06-30 MED ORDER — SODIUM CHLORIDE 0.9 % IJ SOLN: 3.0000 mL | INTRAMUSCULAR | Status: DC | PRN

## 2011-06-30 MED ORDER — GABAPENTIN 600 MG PO TABS
600.0000 mg | ORAL_TABLET | Freq: Every day | ORAL | Status: DC
  Administered 2011-07-01: 600 mg via ORAL
  Filled 2011-06-30: qty 1

## 2011-06-30 MED ORDER — ATORVASTATIN CALCIUM 20 MG PO TABS
20.0000 mg | ORAL_TABLET | Freq: Every day | ORAL | Status: DC
  Administered 2011-06-30: 20 mg via ORAL
  Filled 2011-06-30 (×2): qty 1

## 2011-06-30 MED ORDER — SODIUM CHLORIDE 0.9 % IV SOLN: INTRAVENOUS | Status: AC

## 2011-06-30 MED ORDER — NORTRIPTYLINE HCL 25 MG PO CAPS
75.0000 mg | ORAL_CAPSULE | Freq: Every day | ORAL | Status: DC
  Administered 2011-06-30: 75 mg via ORAL
  Filled 2011-06-30 (×3): qty 3

## 2011-06-30 MED ORDER — FENTANYL CITRATE 0.05 MG/ML IJ SOLN
INTRAMUSCULAR | Status: AC
  Filled 2011-06-30: qty 2

## 2011-06-30 MED ORDER — ASPIRIN 81 MG PO TABS: 81.0000 mg | ORAL_TABLET | Freq: Two times a day (BID) | ORAL | Status: DC

## 2011-06-30 MED ORDER — TAMSULOSIN HCL 0.4 MG PO CAPS
0.4000 mg | ORAL_CAPSULE | Freq: Every day | ORAL | Status: DC
  Administered 2011-07-01: 0.4 mg via ORAL
  Filled 2011-06-30: qty 1

## 2011-06-30 MED ORDER — LIDOCAINE HCL (PF) 1 % IJ SOLN
INTRAMUSCULAR | Status: AC
  Filled 2011-06-30: qty 30

## 2011-06-30 MED ORDER — CLOPIDOGREL BISULFATE 75 MG PO TABS: 75.0000 mg | ORAL_TABLET | Freq: Every day | ORAL | Status: DC

## 2011-06-30 MED ORDER — MIDAZOLAM HCL 2 MG/2ML IJ SOLN
INTRAMUSCULAR | Status: AC
  Filled 2011-06-30: qty 2

## 2011-06-30 MED ORDER — ASPIRIN 81 MG PO CHEW: 81.0000 mg | CHEWABLE_TABLET | Freq: Two times a day (BID) | ORAL | Status: DC

## 2011-06-30 MED ORDER — ASPIRIN EC 325 MG PO TBEC
325.0000 mg | DELAYED_RELEASE_TABLET | Freq: Every day | ORAL | Status: DC
  Administered 2011-07-01: 325 mg via ORAL
  Filled 2011-06-30: qty 1

## 2011-06-30 MED ORDER — HEPARIN SODIUM (PORCINE) 1000 UNIT/ML IJ SOLN
INTRAMUSCULAR | Status: AC
  Filled 2011-06-30: qty 1

## 2011-06-30 MED ORDER — NORTRIPTYLINE HCL 25 MG PO CAPS
75.0000 mg | ORAL_CAPSULE | Freq: Every day | ORAL | Status: DC
Start: 2011-06-30 — End: 2011-06-30

## 2011-06-30 MED ORDER — ACETAMINOPHEN 325 MG PO TABS: 650.0000 mg | ORAL_TABLET | ORAL | Status: DC | PRN

## 2011-06-30 NOTE — Progress Notes (Signed)
Site area: right groin  Site Prior to Removal:  Level 0  Pressure Applied For 20 MINUTES    Minutes Beginning at 1300  Manual:   yes  Patient Status During Pull:  stable  Post Pull Groin Site:  Level 0  Post Pull Instructions Given:  yes  Post Pull Pulses Present:  yes  Dressing Applied:  yes  Comments:   

## 2011-06-30 NOTE — Progress Notes (Signed)
Patient [name]. 84, 61 year old white male is resting.  Chaplain provided pastoral presence and prayer.  I will follow again today.

## 2011-06-30 NOTE — Op Note (Signed)
[name] is a 61 y.o. male    [id number] LOCATION:  FACILITY: [facility]  PHYSICIAN: Nanetta Batty, M.D. 06/21/1950   DATE OF PROCEDURE:  06/30/2011  DATE OF DISCHARGE:  [facility]  PV Intervention       Patient history: Mr. [name] is a 61 year old married Caucasian male father of 2 referred to me by Dr. Wanda Plump from [facility] for claudication. His cardiac risk factors include tobacco abuse with COPD as well as hypertension,  hyperlipidemia. He did have a negative Myoview and echo. I performed turbo hawk directional artherectomy on his left SFA 10/13/2010 excellent angiographic result. No stent was required at that time. He did have symmetric high-grade disease in his right SFA. He enjoyed clinical improvement for a short period time in the last several months has developed recurrent symptoms with worsening Dopplers and ABI suggesting restenosis. He presents now for angiography and re\re intervention for lifestyle limiting claudication.  Procedure description:The patient was brought to the second floor  [facility] cath lab in the postabsorptive state. He was  premedicated with by mouth Valium, IV Versed and fentanyl. His right groin was prepped and shaved in usual sterile fashion. Xylocaine 1% was used  for local anesthesia. A 5 French sheath was inserted into the right common femoral  artery using standard Seldinger technique.a 5 French pigtail catheter was used for abdominal aortography with bifemoral runoff using bolus chase digital subtraction step table technique. Visipaque dye was used for the entirety of the case. Retrograde aortic pressure was monitored during the case.   HEMODYNAMICS:    AO SYSTOLIC/AO DIASTOLIC: 126/56    ANGIOGRAPHIC RESULTS:   1: Abdominal aorta-, aorta and iliac bifurcation appears free of significant atherosclerotic changes  2: Left lower extremity-total left SFA beginning just after the origin  reconstituting in Hunter's canal by profunda femoris collaterals. There was one vessel run off via the profunda artery.  3: Right lower extremity-there were tandem 80 and 90% fairly focal stenoses in the proximal and mid right SFA with one vessel runoff via the peroneal.   IMPRESSION:[name] is a total left SFA her presenting early restenosis just 10 months after his directional atherectomy procedure. We'll proceed with PTA and stenting using Nitinol self-expanding stents.  Procedure description: contralateral access was obtained with a crossover catheter, Glidewire & Rosen wire along with a 7 [place] destination sheath. The patient received 6000 units of heparin intravenously with an ACT at the end of the case of 229. Total contrast administered to the patient was 167 cc. Using a 035 glide wire along with a 35 across endhole catheter the total occlusion was crossed and the Glidewire was exchanged for a Versicore wire. Overlapping balloon inflations were performed with 4 mm x 100 mm long over-the-wire balloons. Stenting was then performed with 6 mm x 100 mm long aspect Nitinol self-expanding stents covering the entirety of the total occlusion. Post dilatation was performed with a 5 x 100 mm long balloon at nominal pressures resulting in reduction of the total occlusion to 0% residual. Completion angiography was performed demonstrating a widely patent SFA with part 1 vessel runoff via the peroneal.   Final impression: Successful PTA and stenting of a total SFA representing early restenosis from a previous triple [facility] atherectomy procedure performed 10 months ago. The patient does continue to smoke. The sheath was pulled back across the bifurcation is a securely in place. The sheath will be removed once the ACT has fallen below 170  and  pressure will be held on the groin to achieve hemostasis. Patient will remain recumbent for 6 hours and will be hydrated overnight. We'll continue aspirin & Plavix and he'll  be discharged home in the morning for followup Dopplers and ultimately a return office visit in 2-3 weeks. He will need his right leg done sometime in the next several weeks.  Runell Gess MD, [facility] 06/30/2011 10:43 AM

## 2011-06-30 NOTE — H&P (Signed)
  H & P will be scanned in.  Pt was reexamined and existing H & P reviewed. No changes found.  Runell Gess, MD [facility] 06/30/2011 9:23 AM

## 2011-07-01 LAB — CBC
MCV: 89.5 fL (ref 78.0–100.0)
Platelets: 396 10*3/uL (ref 150–400)
RBC: 4.47 MIL/uL (ref 4.22–5.81)
RDW: 14.1 % (ref 11.5–15.5)
WBC: 10.8 10*3/uL — ABNORMAL HIGH (ref 4.0–10.5)

## 2011-07-01 LAB — BASIC METABOLIC PANEL
Calcium: 9 mg/dL (ref 8.4–10.5)
Creatinine, Ser: 0.76 mg/dL (ref 0.50–1.35)
GFR calc Af Amer: >90 mL/min (ref >90)
GFR calc non Af Amer: >90 mL/min (ref >90)
Sodium: 136 mEq/L (ref 135–145)

## 2011-07-01 NOTE — Discharge Instructions (Signed)
Call [facility] if any bleeding, swelling or drainage at cath site.  May shower, no tub baths for 48 hours for groin sticks.   Heart Healthy diet.  No lifting over 5 pounds for 3 days, walk only short distances for nest 3 days, gradually increasing walking distance.

## 2011-07-01 NOTE — Progress Notes (Signed)
Subjective: No complaints, anxious about rt. Groin.  But it is stable.  Objective: Vital signs in last 24 hours: Temp:  [97.5 F (36.4 C)-98.7 F (37.1 C)] 98.7 F (37.1 C) (06/15 0747) Pulse Rate:  [75-107] 107  (06/15 0747) Resp:  [16-26] 23  (06/15 0747) BP: (101-147)/(54-114) 109/61 mmHg (06/15 0747) SpO2:  [92 %-98 %] 94 % (06/15 0747) Weight:  [64.8 kg (142 lb 13.7 oz)] 64.8 kg (142 lb 13.7 oz) (06/15 0500) Weight change:    Intake/Output from previous day: -660 06/14 0701 - 06/15 0700 In: 240 [P.O.:240] Out: 900 [Urine:900] Intake/Output this shift:    PE: General:alert and oriented X 3, MAE, follows commands, Denies SOB Heart:S1S2 RRR, no obvious murmur Lungs: clear no wheezes Abd:+ BS, soft, non tender Ext:no edema, rt. Groin cath site without hematoma.    Lab Results:  [facility] 07/01/11 0414  WBC 10.8*  HGB 13.4  HCT 40.0  PLT 396   BMET  Basename 07/01/11 0414  NA 136  K 4.4  CL 101  CO2 25  GLUCOSE 98  BUN 10  CREATININE 0.76  CALCIUM 9.0   No results found for this basename: TROPONINI:2,CK,MB:2 in the last 72 hours  No results found for this basename: CHOL, HDL, LDLCALC, LDLDIRECT, TRIG, CHOLHDL   No results found for this basename: HGBA1C     No results found for this basename: TSH    Hepatic Function Panel No results found for this basename: PROT,ALBUMIN,AST,ALT,ALKPHOS,BILITOT,BILIDIR,IBILI in the last 72 hours No results found for this basename: CHOL in the last 72 hours No results found for this basename: PROTIME in the last 72 hours    EKG: Orders placed during the hospital encounter of 10/13/10  . EKG    Studies/Results: No results found.  Medications: I have reviewed the patient's current medications.    Marland Kitchen aspirin EC  325 mg Oral Daily  . atorvastatin  20 mg Oral q1800  . clopidogrel  75 mg Oral Q breakfast  . fentaNYL      . gabapentin  600 mg Oral Daily  . heparin      . heparin      . lidocaine      .  lisinopril  20 mg Oral Daily  . midazolam      . nortriptyline  75 mg Oral QHS  . Tamsulosin HCl  0.4 mg Oral Daily  . DISCONTD: aspirin  81 mg Oral BID  . DISCONTD: aspirin  81 mg Oral BID  . DISCONTD: clopidogrel  75 mg Oral Q breakfast  . DISCONTD: nortriptyline  75 mg Oral Daily  Assessment/Plan: Patient Active Problem List  Diagnosis  . HYPERLIPIDEMIA  . HYPERTENSION  . COPD  . BENIGN PROSTATIC HYPERTROPHY  . CEREBROVASCULAR ACCIDENT, HX OF  . Tobacco abuse  . Peripheral vascular disease, known Rt SFA stenosis, previous turbo Hawk athrectomy to [employer]. SFA 10/13/10, now with restenosis 06/30/11  . Claudication in peripheral vascular disease, lifestyle limiting, with abnormal arterial dopplers of lower ext.  . S/P angioplasty with stent to Lt. SFA 06/30/11   PLAN: post PTA/Stent to Lt. SFA, plan for outpt dopplers and then f/u with Dr. Allyson Sabal.  Stable discharge home.  Discussed importance of stopping tobacco.  He stated he was cutting back.  LOS: 1 day   [name],[name] 07/01/2011, 8:17 AM   I have seen and examined the patient along with [facility] R, NP.  I have reviewed the chart, notes and new data.  I agree with  NP's note.  Key new complaints: none Key examination changes: right groin access site looks excellent. !+ distal left pulses  PLAN: Outpatient follow up as described. Discussed critical role of smoking cessation at length  Thurmon Fair, MD, [facility] [phone] 07/01/2011, 8:59 AM

## 2011-07-03 LAB — POCT ACTIVATED CLOTTING TIME: Activated Clotting Time: 138 seconds

## 2011-07-03 NOTE — Discharge Summary (Signed)
Physician Discharge Summary  Patient ID: [name] MRN: [id number] DOB/AGE: [birth year] 61 y.o.  Admit date: 06/30/2011 Discharge date: 07/01/2011  Discharge Diagnoses:  Principal Problem:  *Claudication in peripheral vascular disease, lifestyle limiting, with abnormal arterial dopplers of lower ext. Active Problems:  Tobacco abuse  Peripheral vascular disease, known Rt SFA stenosis, previous turbo Hawk athrectomy to [employer]. SFA 10/13/10, now with restenosis 06/30/11  S/P angioplasty with stent to Lt. SFA 06/30/11  HYPERLIPIDEMIA  HYPERTENSION  COPD   Discharged Condition: good  Procedures:Peripheral angiogram and Successful PTA and stenting of a total SFA representing early restenosis from a previous triple Hawk atherectomy procedure performed 10 months ago  Hospital Course: Mr. [name] is a 61 year old married Caucasian male father of 2 referred to Dr. Allyson Sabal by Dr. Wanda Plump from [facility] for claudication. His cardiac risk factors include tobacco abuse with COPD as well as hypertension, hyperlipidemia. He did have a negative Myoview and echo. I performed turbo hawk directional artherectomy on his left SFA 10/13/2010 excellent angiographic result. No stent was required at that time. He did have symmetric high-grade disease in his right SFA. He enjoyed clinical improvement for a short period time in the last several months has developed recurrent symptoms with worsening Dopplers and ABI suggesting restenosis. He presented for angiography and re\re intervention for lifestyle limiting claudication.   Patient underwent PTA and stenting of the total SFA without complications.  By the next morning he was ambulating without complications he had no specific complaints somewhat anxious about his right groin but it was without hematoma.  We did discuss total cessation of tobacco with the patient he realizes that he is at higher risk because of the tobacco.  His plan is to stop  smoking.  Patient was discharged home followup with Dr. Allyson Sabal, After lower extremity arterial Dopplers are done.    Consults: None  Significant Diagnostic Studies: Labs at discharge sodium 136 potassium 4.4 chloride 101 CO2 25 BUN 10 creatinine 0.76 glucose 98  WBC 10.8 hemoglobin 13.4 hematocrit 40 platelets 396.   Discharge Exam: Blood pressure 109/61, pulse 107, temperature 98.7 F (37.1 C), temperature source Oral, resp. rate 23, height 5\' 8"  (1.727 m), weight 64.8 kg (142 lb 13.7 oz), SpO2 94.00%.   General:alert and oriented X 3, MAE, follows commands, Denies SOB  Heart:S1S2 RRR, no obvious murmur  Lungs: clear no wheezes  Abd:+ BS, soft, non tender  Ext:no edema, rt. Groin cath site without hematoma.     Disposition: 01-Home or Self Care  Discharge Orders    Future Appointments: Provider: Department: Dept Phone: Center:   10/31/2011 8:45 AM Gordy Savers, MD [facility] [phone] [facility]     Medication List  As of 07/03/2011  2:52 PM   TAKE these medications         acetaminophen 500 MG tablet   Commonly known as: TYLENOL   Take 1,000 mg by mouth every 6 (six) hours as needed. For pain      aspirin 81 MG tablet   Take 81 mg by mouth 2 (two) times daily.      atorvastatin 20 MG tablet   Commonly known as: LIPITOR   Take 1 tablet (20 mg total) by mouth daily.      clopidogrel 75 MG tablet   Commonly known as: PLAVIX   Take 75 mg by mouth daily.      gabapentin 600 MG tablet   Commonly known as: NEURONTIN   Take 1 tablet (600 mg total)  by mouth daily.      lisinopril 20 MG tablet   Commonly known as: PRINIVIL,ZESTRIL   Take 1 tablet (20 mg total) by mouth daily.      nortriptyline 75 MG capsule   Commonly known as: PAMELOR   Take 1 capsule (75 mg total) by mouth daily.      Tamsulosin HCl 0.4 MG Caps   Commonly known as: FLOMAX   Take 1 capsule (0.4 mg total) by mouth daily.           Follow-up Information    Follow up with  Runell Gess, MD on 07/21/2011. (Dopplers of Lt. leg for follow up at 4:00pm)    Contact information:   [address] [place] [address] [phone]       Follow up with Runell Gess, MD on 07/31/2011. (at 3:45pm for follow up with Dr. Allyson Sabal)    Contact information:   [address] [place] [address] [phone]        Discharge instructions: Call [facility] if any bleeding, swelling or drainage at cath site.  May shower, no tub baths for 48 hours for groin sticks.   Heart Healthy diet.  No lifting over 5 pounds for 3 days, walk only short distances for nest 3 days, gradually increasing walking distance.   SignedLeone Brand 07/03/2011, 2:52 PM

## 2011-09-28 ENCOUNTER — Other Ambulatory Visit: Payer: Self-pay | Admitting: Cardiovascular Disease

## 2011-09-29 ENCOUNTER — Encounter (HOSPITAL_COMMUNITY): Payer: Self-pay | Admitting: Pharmacy Technician

## 2011-10-12 ENCOUNTER — Telehealth: Payer: Self-pay | Admitting: Oncology

## 2011-10-12 NOTE — Telephone Encounter (Signed)
lmonvm for pt re appt for 10/18. Mailed schedule.

## 2011-10-13 ENCOUNTER — Encounter (HOSPITAL_COMMUNITY): Payer: Self-pay | Admitting: General Practice

## 2011-10-13 ENCOUNTER — Ambulatory Visit (HOSPITAL_COMMUNITY)
Admission: RE | Admit: 2011-10-13 | Discharge: 2011-10-14 | Disposition: A | Discharge status: Home / Self Care | Payer: Medicare Other | Source: Ambulatory Visit | Attending: Cardiovascular Disease | Admitting: Cardiovascular Disease

## 2011-10-13 ENCOUNTER — Encounter (HOSPITAL_COMMUNITY)
Admission: RE | Disposition: A | Discharge status: Home / Self Care | Payer: Self-pay | Source: Ambulatory Visit | Attending: Cardiovascular Disease

## 2011-10-13 DIAGNOSIS — Y831 Surgical operation with implant of artificial internal device as the cause of abnormal reaction of the patient, or of later complication, without mention of misadventure at the time of the procedure: Secondary | ICD-10-CM | POA: Insufficient documentation

## 2011-10-13 DIAGNOSIS — J4489 Other specified chronic obstructive pulmonary disease: Secondary | ICD-10-CM | POA: Insufficient documentation

## 2011-10-13 DIAGNOSIS — I10 Essential (primary) hypertension: Secondary | ICD-10-CM | POA: Insufficient documentation

## 2011-10-13 DIAGNOSIS — Z720 Tobacco use: Secondary | ICD-10-CM | POA: Diagnosis present

## 2011-10-13 DIAGNOSIS — Z95820 Peripheral vascular angioplasty status with implants and grafts: Secondary | ICD-10-CM

## 2011-10-13 DIAGNOSIS — I70219 Atherosclerosis of native arteries of extremities with intermittent claudication, unspecified extremity: Secondary | ICD-10-CM | POA: Insufficient documentation

## 2011-10-13 DIAGNOSIS — Z8679 Personal history of other diseases of the circulatory system: Secondary | ICD-10-CM

## 2011-10-13 DIAGNOSIS — F17200 Nicotine dependence, unspecified, uncomplicated: Secondary | ICD-10-CM | POA: Insufficient documentation

## 2011-10-13 DIAGNOSIS — N400 Enlarged prostate without lower urinary tract symptoms: Secondary | ICD-10-CM | POA: Diagnosis present

## 2011-10-13 DIAGNOSIS — I739 Peripheral vascular disease, unspecified: Secondary | ICD-10-CM | POA: Diagnosis present

## 2011-10-13 DIAGNOSIS — E785 Hyperlipidemia, unspecified: Secondary | ICD-10-CM | POA: Diagnosis present

## 2011-10-13 DIAGNOSIS — J449 Chronic obstructive pulmonary disease, unspecified: Secondary | ICD-10-CM | POA: Diagnosis present

## 2011-10-13 DIAGNOSIS — T82898A Other specified complication of vascular prosthetic devices, implants and grafts, initial encounter: Secondary | ICD-10-CM | POA: Insufficient documentation

## 2011-10-13 HISTORY — DX: Shortness of breath: R06.02

## 2011-10-13 HISTORY — DX: Vitamin B12 deficiency anemia due to intrinsic factor deficiency: D51.0

## 2011-10-13 HISTORY — PX: ATHERECTOMY: SHX5502

## 2011-10-13 HISTORY — DX: Cerebral infarction, unspecified: I63.9

## 2011-10-13 SURGERY — ATHERECTOMY
Anesthesia: LOCAL

## 2011-10-13 MED ORDER — SODIUM CHLORIDE 0.9 % IJ SOLN: 3.0000 mL | INTRAMUSCULAR | Status: DC | PRN

## 2011-10-13 MED ORDER — ATORVASTATIN CALCIUM 20 MG PO TABS: 20.0000 mg | ORAL_TABLET | Freq: Every day | ORAL | Status: DC

## 2011-10-13 MED ORDER — ASPIRIN EC 81 MG PO TBEC
81.0000 mg | DELAYED_RELEASE_TABLET | Freq: Two times a day (BID) | ORAL | Status: DC
  Administered 2011-10-13 – 2011-10-14 (×3): 81 mg via ORAL
  Filled 2011-10-13 (×4): qty 1

## 2011-10-13 MED ORDER — NORTRIPTYLINE HCL 25 MG PO CAPS
75.0000 mg | ORAL_CAPSULE | Freq: Every day | ORAL | Status: DC
  Administered 2011-10-13 – 2011-10-14 (×2): 75 mg via ORAL
  Filled 2011-10-13 (×2): qty 3

## 2011-10-13 MED ORDER — MORPHINE SULFATE 2 MG/ML IJ SOLN: 1.0000 mg | INTRAMUSCULAR | Status: DC | PRN

## 2011-10-13 MED ORDER — DIAZEPAM 5 MG PO TABS
5.0000 mg | ORAL_TABLET | ORAL | Status: AC
  Administered 2011-10-13: 5 mg via ORAL
  Filled 2011-10-13: qty 1

## 2011-10-13 MED ORDER — FENTANYL CITRATE 0.05 MG/ML IJ SOLN
INTRAMUSCULAR | Status: AC
  Filled 2011-10-13: qty 2

## 2011-10-13 MED ORDER — HEPARIN SODIUM (PORCINE) 1000 UNIT/ML IJ SOLN
INTRAMUSCULAR | Status: AC
  Filled 2011-10-13: qty 1

## 2011-10-13 MED ORDER — LISINOPRIL 20 MG PO TABS
20.0000 mg | ORAL_TABLET | Freq: Every day | ORAL | Status: DC
  Administered 2011-10-14: 20 mg via ORAL
  Filled 2011-10-13: qty 1

## 2011-10-13 MED ORDER — LIDOCAINE HCL (PF) 1 % IJ SOLN
INTRAMUSCULAR | Status: AC
  Filled 2011-10-13: qty 30

## 2011-10-13 MED ORDER — SODIUM CHLORIDE 0.9 % IV SOLN: INTRAVENOUS | Status: AC

## 2011-10-13 MED ORDER — TAMSULOSIN HCL 0.4 MG PO CAPS
0.4000 mg | ORAL_CAPSULE | Freq: Every day | ORAL | Status: DC
  Administered 2011-10-13 – 2011-10-14 (×2): 0.4 mg via ORAL
  Filled 2011-10-13 (×2): qty 1

## 2011-10-13 MED ORDER — ACETAMINOPHEN 500 MG PO TABS
1000.0000 mg | ORAL_TABLET | Freq: Four times a day (QID) | ORAL | Status: DC | PRN
  Filled 2011-10-13: qty 2

## 2011-10-13 MED ORDER — ASPIRIN 81 MG PO TABS: 81.0000 mg | ORAL_TABLET | Freq: Two times a day (BID) | ORAL | Status: DC

## 2011-10-13 MED ORDER — ASPIRIN 81 MG PO CHEW: 81.0000 mg | CHEWABLE_TABLET | Freq: Every day | ORAL | Status: DC

## 2011-10-13 MED ORDER — ATORVASTATIN CALCIUM 20 MG PO TABS
20.0000 mg | ORAL_TABLET | Freq: Every day | ORAL | Status: DC
  Administered 2011-10-13: 20 mg via ORAL
  Filled 2011-10-13 (×2): qty 1

## 2011-10-13 MED ORDER — CLOPIDOGREL BISULFATE 75 MG PO TABS: 75.0000 mg | ORAL_TABLET | Freq: Every day | ORAL | Status: DC

## 2011-10-13 MED ORDER — SODIUM CHLORIDE 0.9 % IV SOLN
INTRAVENOUS | Status: DC
  Administered 2011-10-13: 08:00:00 via INTRAVENOUS

## 2011-10-13 MED ORDER — CLOPIDOGREL BISULFATE 75 MG PO TABS
75.0000 mg | ORAL_TABLET | Freq: Every day | ORAL | Status: DC
  Administered 2011-10-14: 08:00:00 75 mg via ORAL
  Filled 2011-10-13: qty 1

## 2011-10-13 MED ORDER — ACETAMINOPHEN 325 MG PO TABS: 650.0000 mg | ORAL_TABLET | ORAL | Status: DC | PRN

## 2011-10-13 MED ORDER — MIDAZOLAM HCL 2 MG/2ML IJ SOLN
INTRAMUSCULAR | Status: AC
  Filled 2011-10-13: qty 2

## 2011-10-13 MED ORDER — LISINOPRIL 20 MG PO TABS: 20.0000 mg | ORAL_TABLET | Freq: Every day | ORAL | Status: DC

## 2011-10-13 MED ORDER — GABAPENTIN 600 MG PO TABS
600.0000 mg | ORAL_TABLET | Freq: Every day | ORAL | Status: DC
  Administered 2011-10-13 – 2011-10-14 (×2): 600 mg via ORAL
  Filled 2011-10-13 (×2): qty 1

## 2011-10-13 MED ORDER — ONDANSETRON HCL 4 MG/2ML IJ SOLN: 4.0000 mg | Freq: Four times a day (QID) | INTRAMUSCULAR | Status: DC | PRN

## 2011-10-13 NOTE — Op Note (Signed)
[name] is a 61 y.o. male    [id number] LOCATION:  FACILITY: [facility]  PHYSICIAN: Nanetta Batty, M.D. 1950-06-23   DATE OF PROCEDURE:  10/13/2011  DATE OF DISCHARGE:  [facility]  PV Intervention    History obtained from chart review. [name] is a 61 year old male patient of Dr. Henrene Dodge and Dr. Eleonore Chiquito. He has a history of peripheral vascular occlusive disease status post percutaneous revascularization of a left SFA CTO with stenting. This was factors include hypertension and hyperlipidemia as well as smoking. He had residual disease in his right lower extremity and presents now for elective Turbo [facility] directional atherectomy plus or minus stenting of [name] for lifestyle limiting claudication   PROCEDURE DESCRIPTION:    The patient was brought to the second floor  [facility] cath lab in the postabsorptive state. He was  premedicated with Valium 5 mg by mouth, IV Versed and fentanyl.Marland Kitchen His left groinwas prepped and shaved in usual sterile fashion. Xylocaine 1% was used for local anesthesia. A 7 French sheath  (destination) was inserted into the left common femoral artery using standard Seldinger technique. The patient received 9500 units  of heparin  intravenously.  The ACT was measured at 264. Total contrast measured the patient was 231. A short 5 French pigtail catheter was used to perform abdominal aortography with bifemoral runoff using bolus chase digital subtraction step table technique. Visipaque dye allergies for the entirety of the case. Retrograde aortic pressure was monitored during the case.    HEMODYNAMICS:    AO SYSTOLIC/AO DIASTOLIC: 108/63    ANGIOGRAPHIC RESULTS:   1: Left lower extremity-the left common and external iliac arteries are widely patent. The left SFA stent was widely patent with diffuse 60% diffuse in-stent restenosis" within the distal portion of the stent in the abductor canal. There was  one vessel runoff.  2: Right lower extremity-the right common and external iliac arteries are widely patent. There were 70% fairly focal stenoses in the proximal third as well as in the mid third of the vessel with 2 vessel runoff via the peroneal which was the dominant vessel in the anterior tibial which was patent but diffusely diseased.   IMPRESSION:patent left SFA stent with mild diffuse intimal hyperplasia with 50-60% stenosis the distal portion of the stent with one-vessel runoff. The patient has fairly focal lesions in his proximal mid right SFA unamenable to Turbo [facility] directional arthrectomy. We'll proceed with this using the LXM device.  Procedure description: Contralateral access was obtained with a 5 [place] crossover catheter, 035 angled glide wire, a 35 Rosen wire. The 7 French destination sheath was advanced over the bifurcation. The 035 angled Glidewire was used along with a 5 [place] long interval catheter to traverse the SFA into the distal SFA. The Glidewire was removed and a 6 mm spider distal protection device was advanced into the endhole catheter placed in the above-the-knee popliteal artery. Following this Turbo hock directional atherectomy was performed of the 2 proximal SFA lesions and the mid SFA lesion using 4-6 per lesion. A large amount of whitish atheroma was removed  In 2 separate readings of the device. A low-pressure balloon inflation was performed of the proximal lesion with a 5 mm x 4 cm long balloon at 1 atmosphere for 90 seconds. The final angiographic result result with reduction of tandem 70-80% stenoses proximal as well as mid to less than 10% residual with excellent flow. Completion angiography was performed and  showed no distal embolization with the same below the knee anatomy.  Final impression: Successful turbo Hawke directional atherectomy of proximal and mid right SFA stenoses removing a large amount of atheroma with excellent final angiographic result. Patient  does have mild in-stent restenosis within the left SFA stent placed only 3-4 months ago. He does continue to smoke and respect to modification will be reinforced.  The 7 French destination sheath was then withdrawn across the bifurcation and exchanged over an echo 35 wire for a short 7 [place] sheath. A MYNX hemostasis device was then deployed and the sheath removed. Pressure was held on the groin. The patient left the lab in stable condition. He'll be hydrated overnight, treated with aspirin Plavix, and discharged home in the morning. He'll get followup Dopplers in our office and will see me back after that.  Runell Gess MD, [facility] 10/13/2011 11:21 AM

## 2011-10-13 NOTE — H&P (Signed)
[name] is an 61 y.o. male.   Chief Complaint:  Claudication HPI:   The patient is a 61 yo male with a history of HLD, HTN, COPD, CVA-1996, tobacco abuse, PVD, cerebral anurysm coiling in 2006 and BPH.  He had left SFA PTA in Sept 2012which occluded and he had to undergo reintervention in June 2013 with stent placement.  In July 2012 he had a low risk myoview.   The patient presents for lower extremity PV angiogram with possible intervention due to right leg claudication.  He denies N, V, CP, SOB more than usual, dizziness, abd pain, dysuria, hematuria, hematochezia, melena, LEE.   Past Medical History  Diagnosis Date  . BENIGN PROSTATIC HYPERTROPHY 12/24/2009  . CEREBROVASCULAR ACCIDENT, HX OF 12/24/2009  . COPD 12/24/2009  . HYPERLIPIDEMIA 12/24/2009  . HYPERTENSION 12/24/2009  . Testosterone deficiency   . ED (erectile dysfunction)   . Anemia     pernicious  . Claudication in peripheral vascular disease, lifestyle limiting, with abnormal arterial dopplers of lower ext. 06/30/2011  . S/P angioplasty with stent to Lt. SFA 06/30/11 06/30/2011    Past Surgical History  Procedure Date  . Cervical fusion   . Aneurysm coiling     No family history on file. Social History:  reports that he has been smoking Cigarettes.  He has been smoking about .5 packs per day. He has never used smokeless tobacco. He reports that he does not drink alcohol or use illicit drugs.  Allergies: No Known Allergies  No prescriptions prior to admission    No results found for this or any previous visit (from the past 48 hour(s)). No results found.  Review of Systems  Constitutional: Negative for fever and diaphoresis.  HENT: Negative for congestion and sore throat.   Eyes: Negative for blurred vision.  Respiratory: Positive for shortness of breath and wheezing. Negative for cough.   Cardiovascular: Positive for claudication. Negative for chest pain, palpitations, orthopnea, leg swelling and PND.    Gastrointestinal: Negative for nausea, vomiting, abdominal pain, blood in stool and melena.  Genitourinary: Negative for dysuria.  Neurological: Positive for weakness (Right sided.  Hx of CVA. 1996). Negative for dizziness.    There were no vitals taken for this visit. Physical Exam  Constitutional: He is oriented to person, place, and time. He appears well-developed and well-nourished. No distress.  HENT:  Head: Normocephalic and atraumatic.  Eyes: EOM are normal. Pupils are equal, round, and reactive to light. No scleral icterus.  Neck: Normal range of motion. Neck supple.  Cardiovascular: Normal rate, regular rhythm, S1 normal and S2 normal.   No murmur heard. Pulses:      Radial pulses are 2+ on the right side, and 2+ on the left side.       Dorsalis pedis pulses are 0 on the right side, and 0 on the left side.       Posterior tibial pulses are 0 on the right side, and 0 on the left side.       Lower extremities are warm. No carotid Bruits.  Respiratory: Effort normal. He has wheezes.  GI: Soft. Bowel sounds are normal. There is no tenderness.  Musculoskeletal: He exhibits no edema.       NO LEE   Neurological: He is alert and oriented to person, place, and time. He exhibits normal muscle tone.  Skin: Skin is warm and dry.       There is an approximately 1.2cm oval shaped papule a the  base of the right, posterior side of his neck.  It appears scabbed over.  Edges appear "rolled"     Psychiatric: He has a normal mood and affect.     Assessment/Plan Patient Active Hospital Problem List: HYPERLIPIDEMIA (12/24/2009) HYPERTENSION (12/24/2009) COPD (12/24/2009) BENIGN PROSTATIC HYPERTROPHY (12/24/2009) CEREBROVASCULAR ACCIDENT, HX OF (12/24/2009) Tobacco abuse (10/31/2010) Peripheral vascular disease, known Rt SFA stenosis, previous turbo Hawk athrectomy to [employer]. SFA 10/13/10, now with restenosis 06/30/11 (10/31/2010) S/P angioplasty with stent to Lt. SFA 06/30/11 (06/30/2011)  Plan:    Patient Active Hospital Problem List: HYPERLIPIDEMIA (12/24/2009) HYPERTENSION (12/24/2009) COPD (12/24/2009) BENIGN PROSTATIC HYPERTROPHY (12/24/2009) CEREBROVASCULAR ACCIDENT, HX OF (12/24/2009) Tobacco abuse (10/31/2010) Peripheral vascular disease, known Rt SFA stenosis, previous turbo Hawk athrectomy to [employer]. SFA 10/13/10, now with restenosis 06/30/11 (10/31/2010) S/P angioplasty with stent to Lt. SFA 06/30/11 (06/30/2011)  Plan:  PV angiogram and possible endovascular intervention for lifestyle limiting claudication.  The lesion at the base of his neck apparently appeared about one month ago.  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  I strongly advised him to see a dermatologist.   Leron Croak, Noella Kipnis 10/13/2011, 8:45 AM

## 2011-10-13 NOTE — H&P (Signed)
  H & P will be scanned in.  Pt was reexamined and existing H & P reviewed. No changes found.  Runell Gess, MD [facility] 10/13/2011 11:17 AM

## 2011-10-14 DIAGNOSIS — I739 Peripheral vascular disease, unspecified: Secondary | ICD-10-CM | POA: Diagnosis present

## 2011-10-14 LAB — CBC
Platelets: 308 10*3/uL (ref 150–400)
RBC: 4.43 MIL/uL (ref 4.22–5.81)
RDW: 14.4 % (ref 11.5–15.5)
WBC: 7.8 10*3/uL (ref 4.0–10.5)

## 2011-10-14 LAB — BASIC METABOLIC PANEL
CO2: 24 mEq/L (ref 19–32)
Calcium: 9.2 mg/dL (ref 8.4–10.5)
Creatinine, Ser: 0.78 mg/dL (ref 0.50–1.35)
GFR calc Af Amer: >90 mL/min (ref >90)
GFR calc non Af Amer: >90 mL/min (ref >90)
Sodium: 133 mEq/L — ABNORMAL LOW (ref 135–145)

## 2011-10-14 NOTE — Progress Notes (Signed)
Subjective:  No CP/SOB  Objective:  Temp:  [97.5 F (36.4 C)-97.8 F (36.6 C)] 97.8 F (36.6 C) (09/28 0500) Pulse Rate:  [82-103] 100  (09/27 2300) Resp:  [16-29] 22  (09/28 0500) BP: (86-154)/(55-89) 86/56 mmHg (09/28 0500) SpO2:  [90 %-93 %] 91 % (09/27 2300) Weight:  [68.4 kg (150 lb 12.7 oz)] 68.4 kg (150 lb 12.7 oz) (09/27 1222) Weight change:   Intake/Output from previous day: 09/27 0701 - 09/28 0700 In: 678.8 [P.O.:360; I.V.:318.8] Out: -   Intake/Output from this shift:    Physical Exam: General appearance: alert, cooperative, appears stated age and no distress Neck: no adenopathy, no carotid bruit, no JVD, supple, symmetrical, trachea midline and thyroid not enlarged, symmetric, no tenderness/mass/nodules Lungs: clear to auscultation bilaterally Heart: regular rate and rhythm, S1, S2 normal, no murmur, click, rub or gallop Extremities: extremities normal, atraumatic, no cyanosis or edema and Left groin OK Pulses: 2+ and symmetric  Lab Results: No results found for this or any previous visit (from the past 48 hour(s)).  Imaging: Imaging results have been reviewed  Assessment/Plan:   1. Active Problems: 2.  HYPERLIPIDEMIA 3.  HYPERTENSION 4.  COPD 5.  BENIGN PROSTATIC HYPERTROPHY 6.  CEREBROVASCULAR ACCIDENT, HX OF 7.  Tobacco abuse 8.  Peripheral vascular disease, known Rt SFA stenosis, previous turbo Hawk athrectomy to [employer]. SFA 10/13/10, now with restenosis 06/30/11 9.  S/P angioplasty with stent to Lt. SFA 06/30/11 10.   Time Spent Directly with Patient:  20 minutes  Length of Stay:  LOS: 1 day   Looks great S/P RSFA turbohawk atherectomy. 2+ RPP. Exam benign. Labs pemding. OK for D/C home on ASA and Plavix. LEA then ROV with me 2-3 weeks.  Runell Gess 10/14/2011, 7:27 AM

## 2011-10-14 NOTE — Discharge Summary (Signed)
Patient ID: [name],  MRN: [id number], DOB/AGE: 05-16-1950 61 y.o.  Admit date: 10/13/2011 Discharge date: 10/14/2011  Primary Care Provider: Dr [name] Primary Cardiologist: [name]  Discharge Diagnoses Active Problems:  Claudication in peripheral vascular disease, lifestyle limiting, with abnormal arterial dopplers of lower ext.  PVD, s/p Rt SFA PTA/ stenting 10/13/11  Tobacco abuse  S/P angioplasty with stent to Lt. SFA 06/30/11 (for ISR from 9/12)  HYPERLIPIDEMIA  HYPERTENSION  COPD  BENIGN PROSTATIC HYPERTROPHY  CEREBROVASCULAR ACCIDENT, HX OF    Procedures: Rt SFA PTA stenting 10/13/11   Hospital Course [name] is a 61 year old male patient of Frank. Henrene [name] and Frank. Eleonore [name]. He has a history of peripheral vascular occlusive disease, status post percutaneous revascularization of a left SFA  with stenting June 2013. Risk  factors include hypertension and hyperlipidemia as well as smoking.He did have a Myoview in 2012 that was low risk. He had residual disease in his right lower extremity and presents now for elective Turbo [facility] directional atherectomy plus or minus stenting of [name] for lifestyle limiting claudication. This was done 10/13/11 by [name]. OP dopplers and follow up will be arranged.   Discharge Vitals:  Blood pressure 113/67, pulse 98, temperature 97.4 F (36.3 C), temperature source Oral, resp. rate 22, height 5\' 9"  (1.753 m), weight 68.4 kg (150 lb 12.7 oz), SpO2 91.00%.    Labs: Results for orders placed during the hospital encounter of 10/13/11 (from the past 48 hour(s))  BASIC METABOLIC PANEL     Status: Abnormal   Collection Time   10/14/11  7:25 AM      Component Value Range Comment   Sodium 133 (*) 135 - 145 mEq/L    Potassium 4.1  3.5 - 5.1 mEq/L    Chloride 99  96 - 112 mEq/L    CO2 24  19 - 32 mEq/L    Glucose, Bld 112 (*) 70 - 99 mg/dL    BUN 8  6 - 23 mg/dL    Creatinine, Ser 4.09  0.50 - 1.35 mg/dL    Calcium 9.2  8.4 - 81.1 mg/dL    GFR calc non Af Amer >90  >90 mL/min    GFR calc Af Amer >90  >90 mL/min   CBC     Status: Normal   Collection Time   10/14/11  7:25 AM      Component Value Range Comment   WBC 7.8  4.0 - 10.5 K/uL    RBC 4.43  4.22 - 5.81 MIL/uL    Hemoglobin 13.4  13.0 - 17.0 g/dL    HCT 91.4  78.2 - 95.6 %    MCV 90.3  78.0 - 100.0 fL    MCH 30.2  26.0 - 34.0 pg    MCHC 33.5  30.0 - 36.0 g/dL    RDW 21.3  08.6 - 57.8 %    Platelets 308  150 - 400 K/uL     Disposition:      Follow-up Information    Follow up with Frank Gess, MD. (office will call you)    Contact information:   [address] [place] [address] [phone]          Discharge Medications:    Medication List     As of 10/14/2011 10:27 AM    TAKE these medications         acetaminophen 500 MG tablet   Commonly known as: TYLENOL  Take 1,000 mg by mouth every 6 (six) hours as needed. For pain      aspirin 81 MG tablet   Take 81 mg by mouth 2 (two) times daily.      atorvastatin 20 MG tablet   Commonly known as: LIPITOR   Take 1 tablet (20 mg total) by mouth daily.      clopidogrel 75 MG tablet   Commonly known as: PLAVIX   Take 75 mg by mouth daily.      gabapentin 600 MG tablet   Commonly known as: NEURONTIN   Take 1 tablet (600 mg total) by mouth daily.      lisinopril 20 MG tablet   Commonly known as: PRINIVIL,ZESTRIL   Take 1 tablet (20 mg total) by mouth daily.      nortriptyline 75 MG capsule   Commonly known as: PAMELOR   Take 1 capsule (75 mg total) by mouth daily.      Tamsulosin HCl 0.4 MG Caps   Commonly known as: FLOMAX   Take 1 capsule (0.4 mg total) by mouth daily.          Duration of Discharge Encounter: Greater than 30 minutes including physician time.  Frank Provost PA-C 10/14/2011 10:27 AM

## 2011-10-17 LAB — POCT ACTIVATED CLOTTING TIME: Activated Clotting Time: 229 seconds

## 2011-10-31 ENCOUNTER — Encounter: Payer: Medicare Other | Admitting: Internal Medicine

## 2011-11-01 ENCOUNTER — Ambulatory Visit (INDEPENDENT_AMBULATORY_CARE_PROVIDER_SITE_OTHER): Payer: Medicare Other

## 2011-11-01 DIAGNOSIS — Z23 Encounter for immunization: Secondary | ICD-10-CM

## 2011-11-03 ENCOUNTER — Ambulatory Visit: Payer: Medicare Other | Admitting: Oncology

## 2011-11-03 ENCOUNTER — Other Ambulatory Visit: Payer: Medicare Other | Admitting: Lab

## 2011-11-21 ENCOUNTER — Other Ambulatory Visit: Payer: Self-pay | Admitting: Oncology

## 2011-11-21 ENCOUNTER — Ambulatory Visit (HOSPITAL_BASED_OUTPATIENT_CLINIC_OR_DEPARTMENT_OTHER): Payer: Medicare Other | Admitting: Oncology

## 2011-11-21 ENCOUNTER — Telehealth: Payer: Self-pay | Admitting: Oncology

## 2011-11-21 ENCOUNTER — Other Ambulatory Visit (HOSPITAL_BASED_OUTPATIENT_CLINIC_OR_DEPARTMENT_OTHER): Payer: Medicare Other

## 2011-11-21 VITALS — BP 119/63 | HR 85 | Temp 97.3°F | Resp 20 | Ht 69.0 in | Wt 150.0 lb

## 2011-11-21 DIAGNOSIS — D473 Essential (hemorrhagic) thrombocythemia: Secondary | ICD-10-CM

## 2011-11-21 LAB — CBC WITH DIFFERENTIAL/PLATELET
BASO%: 1.3 % (ref 0.0–2.0)
Basophils Absolute: 0.1 10e3/uL (ref 0.0–0.1)
EOS%: 5.3 % (ref 0.0–7.0)
Eosinophils Absolute: 0.4 10e3/uL (ref 0.0–0.5)
HCT: 40.4 % (ref 38.4–49.9)
HGB: 13.5 g/dL (ref 13.0–17.1)
LYMPH%: 29 % (ref 14.0–49.0)
MCH: 30.5 pg (ref 27.2–33.4)
MCHC: 33.3 g/dL (ref 32.0–36.0)
MCV: 91.4 fL (ref 79.3–98.0)
MONO#: 0.5 10e3/uL (ref 0.1–0.9)
MONO%: 7.4 % (ref 0.0–14.0)
NEUT#: 4.1 10e3/uL (ref 1.5–6.5)
NEUT%: 57 % (ref 39.0–75.0)
Platelets: 458 10e3/uL — ABNORMAL HIGH (ref 140–400)
RBC: 4.42 10e6/uL (ref 4.20–5.82)
RDW: 14 % (ref 11.0–14.6)
WBC: 7.1 10e3/uL (ref 4.0–10.3)
lymph#: 2.1 10e3/uL (ref 0.9–3.3)

## 2011-11-21 LAB — COMPREHENSIVE METABOLIC PANEL (CC13)
ALT: 8 U/L (ref 0–55)
AST: 8 U/L (ref 5–34)
Albumin: 3.8 g/dL (ref 3.5–5.0)
Alkaline Phosphatase: 129 U/L (ref 40–150)
BUN: 9 mg/dL (ref 7.0–26.0)
CO2: 27 meq/L (ref 22–29)
Calcium: 9.6 mg/dL (ref 8.4–10.4)
Chloride: 101 meq/L (ref 98–107)
Creatinine: 0.8 mg/dL (ref 0.7–1.3)
Glucose: 108 mg/dL — ABNORMAL HIGH (ref 70–99)
Potassium: 4.5 meq/L (ref 3.5–5.1)
Sodium: 133 meq/L — ABNORMAL LOW (ref 136–145)
Total Bilirubin: 0.29 mg/dL (ref 0.20–1.20)
Total Protein: 7.7 g/dL (ref 6.4–8.3)

## 2011-11-21 NOTE — Progress Notes (Signed)
Hematology and Oncology Follow Up Visit  GUMECINDO HOPKIN [id number] [birth year] 61 y.o. 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  Current therapy: :  He is on aspirin 325 mg daily.  Interim History: Mr. [name] presents today for a followup visit.  He is a pleasant 61 year old gentleman with a significant history of peripheral vascular disease, COPD, longstanding hypertension, and has had a fluctuating thrombocytosis, platelet counts between 400 and 490.  He had really no clear-cut evidence to suggest myeloproliferative disorder.  His hemoglobin has been normal, white cell count has been normal.  A JAK2 mutation was also normal.  Since the last time I saw him, he his claudication issues has improved.  He did have a chest x-ray leading up to that on October 06, 2010, which I reviewed with him today and showed chronic changes related to COPD, not any evidence to suggest malignancy. Otherwise he had not had any major issues at this time.  He had not had any thrombotic events.  Had not had any chest pain or difficulty breathing.  He has continued to perform activities of daily living without any hindrance or decline.  Medications: I have reviewed the patient's current medications. Current outpatient prescriptions:acetaminophen (TYLENOL) 500 MG tablet, Take 1,000 mg by mouth every 6 (six) hours as needed. For pain, Disp: , Rfl: ;  aspirin 81 MG tablet, Take 81 mg by mouth 2 (two) times daily. , Disp: , Rfl: ;  atorvastatin (LIPITOR) 20 MG tablet, Take 1 tablet (20 mg total) by mouth daily., Disp: 90 tablet, Rfl: 3;  clopidogrel (PLAVIX) 75 MG tablet, Take 75 mg by mouth daily. , Disp: , Rfl:  gabapentin (NEURONTIN) 600 MG tablet, Take 1 tablet (600 mg total) by mouth daily., Disp: 90 tablet, Rfl: 3;  lisinopril (PRINIVIL,ZESTRIL) 20 MG tablet, Take 1 tablet (20 mg total) by  mouth daily., Disp: 90 tablet, Rfl: 3;  nortriptyline (PAMELOR) 75 MG capsule, Take 1 capsule (75 mg total) by mouth daily., Disp: 90 capsule, Rfl: 3;  Tamsulosin HCl (FLOMAX) 0.4 MG CAPS, Take 1 capsule (0.4 mg total) by mouth daily., Disp: 90 capsule, Rfl: 3  Allergies: No Known Allergies  Past Medical History, Surgical history, Social history, and Family History were reviewed and updated.  Review of Systems: Constitutional:  Negative for fever, chills, night sweats, anorexia, weight loss, pain. Cardiovascular: no chest pain or dyspnea on exertion Respiratory: negative Neurological: negative Dermatological: negative ENT: negative Skin: Negative. Gastrointestinal: negative Genito-Urinary: negative Hematological and Lymphatic: negative Breast: negative Musculoskeletal: negative Remaining ROS negative. Physical Exam: Blood pressure 119/63, pulse 85, temperature 97.3 F (36.3 C), temperature source Oral, resp. rate 20, height 5\' 9"  (1.753 m), weight 150 lb (68.04 kg). ECOG:  General appearance: alert Head: Normocephalic, without obvious abnormality, atraumatic Neck: no adenopathy, no carotid bruit, no JVD, supple, symmetrical, trachea midline and thyroid not enlarged, symmetric, no tenderness/mass/nodules Lymph nodes: Cervical, supraclavicular, and axillary nodes normal. Heart:regular rate and rhythm, S1, S2 normal, no murmur, click, rub or gallop Lung:chest clear, no wheezing, rales, normal symmetric air entry Abdomin: soft, non-tender, without masses or organomegaly EXT:no erythema, induration, or nodules   Lab Results: Lab Results  Component Value Date   WBC 7.1 11/21/2011   HGB 13.5 11/21/2011   HCT 40.4 11/21/2011   MCV 91.4 11/21/2011   PLT 458* 11/21/2011     Chemistry      Component Value Date/Time  NA 133* 10/14/2011 0725   K 4.1 10/14/2011 0725   CL 99 10/14/2011 0725   CO2 24 10/14/2011 0725   BUN 8 10/14/2011 0725   CREATININE 0.78 10/14/2011 0725      Component  Value Date/Time   CALCIUM 9.2 10/14/2011 0725   ALKPHOS 79 10/19/2010 0812   ALKPHOS 79 10/19/2010 0812   AST 10 10/19/2010 0812   AST 10 10/19/2010 0812   ALT 8 10/19/2010 0812   ALT 8 10/19/2010 0812   BILITOT 0.3 10/19/2010 0812   BILITOT 0.3 10/19/2010 0812       Impression and Plan:  This is a 61 year old gentleman with the following issues:  1. Thrombocytosis, most likely reactive in nature.  Again I do not see any clear-cut evidence of a myeloproliferative disorder, but certainly essential thrombocythemia is to be considered on the differential at this point.  From a management standpoint, really no further intervention is warranted other than active surveillance.  I will like to recheck his blood count in about 1 year.  Should he need to be seen sooner, I will be happy to see Mr. Osten at any time.   2. Thrombosis prophylaxis.  He continues to be on aspirin.  3. Followup will be in 11/2012, sooner if needed.    Kandyce Dieguez, MD 11/5/20139:36 AM

## 2011-11-21 NOTE — Telephone Encounter (Signed)
lvm for pt regarding Nov 2014 lab and appt....mailed Nov 2014 appt schedule to pt.

## 2011-12-04 ENCOUNTER — Ambulatory Visit (INDEPENDENT_AMBULATORY_CARE_PROVIDER_SITE_OTHER): Payer: Medicare Other | Admitting: Internal Medicine

## 2011-12-04 ENCOUNTER — Encounter: Payer: Self-pay | Admitting: Internal Medicine

## 2011-12-04 VITALS — BP 120/70 | HR 72 | Temp 97.6°F | Resp 16 | Ht 66.25 in | Wt 150.0 lb

## 2011-12-04 DIAGNOSIS — E785 Hyperlipidemia, unspecified: Secondary | ICD-10-CM

## 2011-12-04 DIAGNOSIS — Z8679 Personal history of other diseases of the circulatory system: Secondary | ICD-10-CM

## 2011-12-04 DIAGNOSIS — I10 Essential (primary) hypertension: Secondary | ICD-10-CM

## 2011-12-04 DIAGNOSIS — I739 Peripheral vascular disease, unspecified: Secondary | ICD-10-CM

## 2011-12-04 NOTE — Progress Notes (Signed)
Subjective:    Patient ID: [name], male    DOB: 03/18/1950, 60 y.o.   MRN: [id number]  HPI  61 year old patient who is seen today for followup of hypertension dyslipidemia CAD. He has a history of cerebral vascular disease and is status post left brain stroke in 1996. He is seen today for completion of DMV forms. He has been driving without difficulty or accidents since his stroke in 1996. He has had no issues with driving and again no accidents. He has very mild right arm weakness as a residual He has dyslipidemia hypertension COPD and continues to smoke. No active cardiopulmonary complaints and no focal neurological symptoms  DMV forms were completed  Past Medical History  Diagnosis Date  . BENIGN PROSTATIC HYPERTROPHY 12/24/2009  . CEREBROVASCULAR ACCIDENT, HX OF 12/24/2009  . HYPERLIPIDEMIA 12/24/2009  . HYPERTENSION 12/24/2009  . Testosterone deficiency   . ED (erectile dysfunction)   . Claudication in peripheral vascular disease, lifestyle limiting, with abnormal arterial dopplers of lower ext. 06/30/2011  . S/P angioplasty with stent to Lt. SFA 06/30/11 06/30/2011  . COPD 12/24/2009  . Shortness of breath     "from the COPD" (10/13/2011)  . Stroke 1996    "right side not as strong as left since; numbness R leg/foot" (10/13/2011)  . Pernicious anemia     History   Social History  . Marital Status: Married    Spouse Name: N/A    Number of Children: N/A  . Years of Education: N/A   Occupational History  . Not on file.   Social History Main Topics  . Smoking status: Current Every Day Smoker -- 0.5 packs/day for 45 years    Types: Cigarettes  . Smokeless tobacco: Never Used  . Alcohol Use: No  . Drug Use: No  . Sexually Active: Not Currently   Other Topics Concern  . Not on file   Social History Narrative  . No narrative on file    Past Surgical History  Procedure Date  . Cervical fusion 1980's    "total of 3-4 OR's on my neck; pinched nerve" (10/13/2011)  .  Aneurysm coiling 2007  . Peripheral arterial stent graft 06/30/2010; 10/13/10    left; left    No family history on file.  No Known Allergies  Current Outpatient Prescriptions on File Prior to Visit  Medication Sig Dispense Refill  . acetaminophen (TYLENOL) 500 MG tablet Take 1,000 mg by mouth every 6 (six) hours as needed. For pain      . aspirin 81 MG tablet Take 81 mg by mouth 2 (two) times daily.       Marland Kitchen atorvastatin (LIPITOR) 20 MG tablet Take 1 tablet (20 mg total) by mouth daily.  90 tablet  3  . clopidogrel (PLAVIX) 75 MG tablet Take 75 mg by mouth daily.       Marland Kitchen gabapentin (NEURONTIN) 600 MG tablet Take 1 tablet (600 mg total) by mouth daily.  90 tablet  3  . lisinopril (PRINIVIL,ZESTRIL) 20 MG tablet Take 1 tablet (20 mg total) by mouth daily.  90 tablet  3  . nortriptyline (PAMELOR) 75 MG capsule Take 1 capsule (75 mg total) by mouth daily.  90 capsule  3  . Tamsulosin HCl (FLOMAX) 0.4 MG CAPS Take 1 capsule (0.4 mg total) by mouth daily.  90 capsule  3    BP 120/70  Pulse 72  Temp 97.6 F (36.4 C) (Oral)  Resp 16  Ht 5' 6.25" (1.683 m)  Wt 150 lb (68.04 kg)  BMI 24.03 kg/m2       Review of Systems  Constitutional: Negative for fever, chills, appetite change and fatigue.  HENT: Negative for hearing loss, ear pain, congestion, sore throat, trouble swallowing, neck stiffness, dental problem, voice change and tinnitus.   Eyes: Negative for pain, discharge and visual disturbance.  Respiratory: Negative for cough, chest tightness, wheezing and stridor.   Cardiovascular: Negative for chest pain, palpitations and leg swelling.  Gastrointestinal: Negative for nausea, vomiting, abdominal pain, diarrhea, constipation, blood in stool and abdominal distention.  Genitourinary: Negative for urgency, hematuria, flank pain, discharge, difficulty urinating and genital sores.  Musculoskeletal: Negative for myalgias, back pain, joint swelling, arthralgias and gait problem.  Skin:  Negative for rash.  Neurological: Negative for dizziness, syncope, speech difficulty, weakness, numbness and headaches.  Hematological: Negative for adenopathy. Does not bruise/bleed easily.  Psychiatric/Behavioral: Negative for behavioral problems and dysphoric mood. The patient is not nervous/anxious.        Objective:   Physical Exam  Constitutional: He is oriented to person, place, and time. He appears well-developed.       Blood pressure 120/70  HENT:  Head: Normocephalic.  Right Ear: External ear normal.  Left Ear: External ear normal.  Eyes: Conjunctivae normal and EOM are normal.  Neck: Normal range of motion.  Cardiovascular: Normal rate and normal heart sounds.   Pulmonary/Chest: Breath sounds normal.  Abdominal: Bowel sounds are normal.  Musculoskeletal: Normal range of motion. He exhibits no edema and no tenderness.       Mild right arm weakness (4/5 strength)  Neurological: He is alert and oriented to person, place, and time.  Psychiatric: He has a normal mood and affect. His behavior is normal.          Assessment & Plan:   Hypertension well controlled Ongoing tobacco use. Total smoking cessation encouraged Remote effects of CVA. DMV forms completed  Recheck 4-6 months

## 2012-01-12 ENCOUNTER — Encounter: Payer: Medicare Other | Admitting: Internal Medicine

## 2012-03-20 ENCOUNTER — Telehealth: Payer: Self-pay | Admitting: Internal Medicine

## 2012-03-20 ENCOUNTER — Ambulatory Visit (INDEPENDENT_AMBULATORY_CARE_PROVIDER_SITE_OTHER): Payer: Medicare Other | Admitting: Family Medicine

## 2012-03-20 ENCOUNTER — Encounter: Payer: Self-pay | Admitting: Family Medicine

## 2012-03-20 ENCOUNTER — Ambulatory Visit (INDEPENDENT_AMBULATORY_CARE_PROVIDER_SITE_OTHER)
Admission: RE | Admit: 2012-03-20 | Discharge: 2012-03-20 | Disposition: A | Discharge status: Home / Self Care | Payer: Medicare Other | Source: Ambulatory Visit | Attending: Family Medicine | Admitting: Family Medicine

## 2012-03-20 VITALS — BP 124/62 | Temp 97.6°F | Wt 148.0 lb

## 2012-03-20 DIAGNOSIS — R05 Cough: Secondary | ICD-10-CM

## 2012-03-20 DIAGNOSIS — R042 Hemoptysis: Secondary | ICD-10-CM

## 2012-03-20 NOTE — Telephone Encounter (Signed)
Patient Information:  Caller Name: [name]  Phone: [phone]  Patient: Frank, [name]  Gender: Male  DOB: 1950/11/29  Age: 62 Years  PCP: Eleonore Chiquito [facility])  Office Follow Up:  Does the office need to follow up with this patient?: Yes  Instructions For The Office: Wife is going to call husband at home and try to get him to come to office.  This pt needs to be seen in office now, so if no appt available, then check with MD to see if he can be worked in.  No need to be triaged again.  RN Note:  Wife going to call husband and let him know office advises he needs to be seen now in office.  She will call back to get appt scheduled.  Symptoms  Reason For Call & Symptoms: Wife is calling today 03/20/12 regarding husband is currently taking Plavix and 81 mg Asa daily.  Has COPD and wife said he has been coughing up blood.  This started about 8:30 PM last night.  Wife only witness one episode and was about 1 tsp full if not more and was bright red.  Husband told wife this happened a few more times during the night.  Husband does not know wife is calling.  She said she has not been able to make him come to office for appt.  Reviewed Health History In EMR: Yes  Reviewed Medications In EMR: Yes  Reviewed Allergies In EMR: Yes  Reviewed Surgeries / Procedures: Yes  Date of Onset of Symptoms: 03/19/2012  Guideline(s) Used:  Cough  Disposition Per Guideline:   Go to Office Now  Reason For Disposition Reached:   Coughed up > 1 tablespoon (15 ml) blood (Exception: blood-tinged sputum)  Advice Given:  N/A  Patient Refused Recommendation:  Patient Will Make Own Appointment  Wife will call office back after talking with husband to get an appt.

## 2012-03-20 NOTE — Progress Notes (Addendum)
  Subjective:    Patient ID: [name], male    DOB: October 23, 1950, 62 y.o.   MRN: [id number]  HPI  Last night coughed up some bright blood Today minimal blood tinged sputum. Chronic cough and hx COPD.   ?Some recent increased dyspnea-over baseline.   No pleuritic or chest pain Good appetite and no reported weight loss (though chart shows down 6 pds since last April). On ASA and Plavix-hx CAD, PVD, and CVA.  No fever.  No recent epistaxis.   No other bleeding complications.  Past Medical History  Diagnosis Date  . BENIGN PROSTATIC HYPERTROPHY 12/24/2009  . CEREBROVASCULAR ACCIDENT, HX OF 12/24/2009  . HYPERLIPIDEMIA 12/24/2009  . HYPERTENSION 12/24/2009  . Testosterone deficiency   . ED (erectile dysfunction)   . Claudication in peripheral vascular disease, lifestyle limiting, with abnormal arterial dopplers of lower ext. 06/30/2011  . S/P angioplasty with stent to Lt. SFA 06/30/11 06/30/2011  . COPD 12/24/2009  . Shortness of breath     "from the COPD" (10/13/2011)  . Stroke 1996    "right side not as strong as left since; numbness R leg/foot" (10/13/2011)  . Pernicious anemia    Past Surgical History  Procedure Laterality Date  . Cervical fusion  1980's    "total of 3-4 OR's on my neck; pinched nerve" (10/13/2011)  . Aneurysm coiling  2007  . Peripheral arterial stent graft  06/30/2010; 10/13/10    left; left    reports that he has been smoking Cigarettes.  He has a 22.5 pack-year smoking history. He has never used smokeless tobacco. He reports that he does not drink alcohol or use illicit drugs. family history is not on file. No Known Allergies    Review of Systems  Constitutional: Negative for fever, chills and unexpected weight change.  HENT: Negative for nosebleeds.   Respiratory: Positive for cough and shortness of breath. Negative for wheezing.   Cardiovascular: Negative for chest pain, palpitations and leg swelling.  Gastrointestinal: Negative for abdominal pain.   Neurological: Negative for dizziness and syncope.  Hematological: Negative for adenopathy.       Objective:   Physical Exam  Constitutional: He appears well-developed and well-nourished.  HENT:  Right Ear: External ear normal.  Left Ear: External ear normal.  Mouth/Throat: Oropharynx is clear and moist.  Neck: Neck supple. No thyromegaly present.  Cardiovascular: Normal rate and regular rhythm.   Pulmonary/Chest:  Diminished breath sounds throughout. Slightly diminished breath sounds left base compared to right. A few faint wheezes  Musculoskeletal: He exhibits no edema.  Lymphadenopathy:    He has no cervical adenopathy.          Assessment & Plan:  Hemoptysis. Patient has known COPD, chronic cough, and dyspnea which are unchanged. No hypoxemia. He's had mild weight loss from 154 pounds several months ago to 148 pounds today. Start with chest x-ray. Consider CT chest even if CXR normal (rule out malignancy).  No clinical suspicion of PE. No fever, productive cough, etc to suggest acute infection.  Chest x-ray reveals left hilar fullness worrisome for possible mass. Discussed with his primary physician and they will see him back soon and order CT chest to further evaluate

## 2012-03-20 NOTE — Telephone Encounter (Signed)
Please advise re possible work in for today.

## 2012-03-20 NOTE — Telephone Encounter (Signed)
Pt scheduled to see Dr. Caryl Never today due to Dr. Amador Cunas has no openings.

## 2012-03-21 ENCOUNTER — Ambulatory Visit (INDEPENDENT_AMBULATORY_CARE_PROVIDER_SITE_OTHER): Payer: Medicare Other | Admitting: Internal Medicine

## 2012-03-21 ENCOUNTER — Encounter: Payer: Self-pay | Admitting: Internal Medicine

## 2012-03-21 VITALS — BP 140/80 | HR 95 | Temp 97.6°F | Wt 148.0 lb

## 2012-03-21 DIAGNOSIS — J4489 Other specified chronic obstructive pulmonary disease: Secondary | ICD-10-CM

## 2012-03-21 DIAGNOSIS — F17200 Nicotine dependence, unspecified, uncomplicated: Secondary | ICD-10-CM

## 2012-03-21 DIAGNOSIS — R918 Other nonspecific abnormal finding of lung field: Secondary | ICD-10-CM

## 2012-03-21 DIAGNOSIS — J449 Chronic obstructive pulmonary disease, unspecified: Secondary | ICD-10-CM

## 2012-03-21 DIAGNOSIS — C3490 Malignant neoplasm of unspecified part of unspecified bronchus or lung: Secondary | ICD-10-CM | POA: Insufficient documentation

## 2012-03-21 DIAGNOSIS — Z720 Tobacco use: Secondary | ICD-10-CM

## 2012-03-21 DIAGNOSIS — R222 Localized swelling, mass and lump, trunk: Secondary | ICD-10-CM

## 2012-03-21 NOTE — Patient Instructions (Signed)
Chest CT as discussed  Smoking tobacco is very bad for your health. You should stop smoking immediately.  Continue to hold aspirin and Plavix

## 2012-03-21 NOTE — Progress Notes (Signed)
Subjective:    Patient ID: [name], male    DOB: [birth year], 62 y.o.   MRN: [id number]  HPI  62 year old patient who has a history of COPD and tobacco abuse. He has peripheral vascular disease and treated hypertension. Medical regimen includes aspirin and Plavix. He was seen yesterday after small volume but fresh new onset hemoptysis. Aspirin Plavix was discontinued and symptoms have not reoccurred. A chest x-ray was obtained yesterday which revealed a left hilar mass suspicious for bronchogenic carcinoma. Chest x-ray in September of 2012 unremarkable except for findings of COPD. Patient is in today to discuss the findings and evaluation His weight has been stable and he denies any constitutional complaints  Wt Readings from Last 3 Encounters:  03/21/12 148 lb (67.132 kg)  03/20/12 148 lb (67.132 kg)  12/04/11 150 lb (68.04 kg)    Past Medical History  Diagnosis Date  . BENIGN PROSTATIC HYPERTROPHY 12/24/2009  . CEREBROVASCULAR ACCIDENT, HX OF 12/24/2009  . HYPERLIPIDEMIA 12/24/2009  . HYPERTENSION 12/24/2009  . Testosterone deficiency   . ED (erectile dysfunction)   . Claudication in peripheral vascular disease, lifestyle limiting, with abnormal arterial dopplers of lower ext. 06/30/2011  . S/P angioplasty with stent to Lt. SFA 06/30/11 06/30/2011  . COPD 12/24/2009  . Shortness of breath     "from the COPD" (10/13/2011)  . Stroke 1996    "right side not as strong as left since; numbness R leg/foot" (10/13/2011)  . Pernicious anemia     History   Social History  . Marital Status: Married    Spouse Name: N/A    Number of Children: N/A  . Years of Education: N/A   Occupational History  . Not on file.   Social History Main Topics  . Smoking status: Current Every Day Smoker -- 0.50 packs/day for 45 years    Types: Cigarettes  . Smokeless tobacco: Never Used  . Alcohol Use: No  . Drug Use: No  . Sexually Active: Not Currently   Other Topics Concern  . Not on file    Social History Narrative  . No narrative on file    Past Surgical History  Procedure Laterality Date  . Cervical fusion  1980's    "total of 3-4 OR's on my neck; pinched nerve" (10/13/2011)  . Aneurysm coiling  2007  . Peripheral arterial stent graft  06/30/2010; 10/13/10    left; left    History reviewed. No pertinent family history.  No Known Allergies  Current Outpatient Prescriptions on File Prior to Visit  Medication Sig Dispense Refill  . acetaminophen (TYLENOL) 500 MG tablet Take 1,000 mg by mouth every 6 (six) hours as needed. For pain      . atorvastatin (LIPITOR) 20 MG tablet Take 1 tablet (20 mg total) by mouth daily.  90 tablet  3  . gabapentin (NEURONTIN) 600 MG tablet Take 1 tablet (600 mg total) by mouth daily.  90 tablet  3  . nortriptyline (PAMELOR) 75 MG capsule Take 1 capsule (75 mg total) by mouth daily.  90 capsule  3  . Tamsulosin HCl (FLOMAX) 0.4 MG CAPS Take 1 capsule (0.4 mg total) by mouth daily.  90 capsule  3  . aspirin 81 MG tablet Take 81 mg by mouth 2 (two) times daily.       . clopidogrel (PLAVIX) 75 MG tablet Take 75 mg by mouth daily.       Marland Kitchen lisinopril (PRINIVIL,ZESTRIL) 20 MG tablet Take 1 tablet (20 mg total)  by mouth daily.  90 tablet  3   No current facility-administered medications on file prior to visit.    BP 140/80  Pulse 95  Temp(Src) 97.6 F (36.4 C) (Oral)  Wt 148 lb (67.132 kg)  BMI 23.7 kg/m2  SpO2 95%    Review of Systems  Constitutional: Negative for fever, chills, appetite change and fatigue.  HENT: Negative for hearing loss, ear pain, congestion, sore throat, trouble swallowing, neck stiffness, dental problem, voice change and tinnitus.   Eyes: Negative for pain, discharge and visual disturbance.  Respiratory: Positive for cough. Negative for chest tightness, wheezing and stridor.        Recent onset hemoptysis  Cardiovascular: Negative for chest pain, palpitations and leg swelling.  Gastrointestinal: Negative for  nausea, vomiting, abdominal pain, diarrhea, constipation, blood in stool and abdominal distention.  Genitourinary: Negative for urgency, hematuria, flank pain, discharge, difficulty urinating and genital sores.  Musculoskeletal: Negative for myalgias, back pain, joint swelling, arthralgias and gait problem.  Skin: Negative for rash.  Neurological: Negative for dizziness, syncope, speech difficulty, weakness, numbness and headaches.  Hematological: Negative for adenopathy. Does not bruise/bleed easily.  Psychiatric/Behavioral: Negative for behavioral problems and dysphoric mood. The patient is not nervous/anxious.        Objective:   Physical Exam  Constitutional: He appears well-developed and well-nourished. No distress.  Cardiovascular: Normal rate and regular rhythm.   Pulmonary/Chest: Effort normal.  Mildly diminished breath sounds  Lymphadenopathy:    He has no cervical adenopathy.          Assessment & Plan:    L Hilar mass-  Will proceed  with chest CT with IV contrast;  Likely will need pulm referral and bronch COPD  W/u discussed with patient and wife

## 2012-03-22 ENCOUNTER — Inpatient Hospital Stay: Admission: RE | Admit: 2012-03-22 | Payer: Medicare Other | Source: Ambulatory Visit

## 2012-03-22 ENCOUNTER — Ambulatory Visit: Payer: Medicare Other | Admitting: Internal Medicine

## 2012-03-25 ENCOUNTER — Ambulatory Visit (INDEPENDENT_AMBULATORY_CARE_PROVIDER_SITE_OTHER)
Admission: RE | Admit: 2012-03-25 | Discharge: 2012-03-25 | Disposition: A | Discharge status: Home / Self Care | Payer: Medicare Other | Source: Ambulatory Visit | Attending: Internal Medicine | Admitting: Internal Medicine

## 2012-03-25 DIAGNOSIS — R918 Other nonspecific abnormal finding of lung field: Secondary | ICD-10-CM

## 2012-03-25 DIAGNOSIS — R222 Localized swelling, mass and lump, trunk: Secondary | ICD-10-CM

## 2012-03-25 MED ORDER — IOHEXOL 300 MG/ML  SOLN
80.0000 mL | Freq: Once | INTRAMUSCULAR | Status: AC | PRN
  Administered 2012-03-25: 80 mL via INTRAVENOUS

## 2012-03-26 ENCOUNTER — Other Ambulatory Visit: Payer: Self-pay | Admitting: Internal Medicine

## 2012-03-26 ENCOUNTER — Telehealth: Payer: Self-pay

## 2012-03-26 DIAGNOSIS — R918 Other nonspecific abnormal finding of lung field: Secondary | ICD-10-CM

## 2012-03-26 NOTE — Telephone Encounter (Signed)
Called, spoke with [name].  She is unsure if pt will go to satellite offices and requesting I call him to schedule this appt.  Would like appt to be this week.  Called, spoke with pt. We have no consult openings at [facility] this week.  I offered [place] office for this Thursday with Dr. Sherene Sires; pt can follow up with Dr. Sherene Sires at [place] office for any future visits.  Pt ok with this.  We have scheduled him to see Dr. Sherene Sires on Thursday, March 13 at 2:45 pm.  He will arrive at 2:30.  I have provided him with [place]'s office address and phone number.  He verbalized understanding of this and voiced no further questions or concerns at this time.

## 2012-03-28 ENCOUNTER — Ambulatory Visit (INDEPENDENT_AMBULATORY_CARE_PROVIDER_SITE_OTHER): Payer: Medicare Other | Admitting: Internal Medicine

## 2012-03-28 ENCOUNTER — Encounter: Payer: Self-pay | Admitting: Internal Medicine

## 2012-03-28 VITALS — BP 114/62 | HR 90 | Temp 98.0°F | Ht 67.0 in | Wt 149.8 lb

## 2012-03-28 DIAGNOSIS — Z720 Tobacco use: Secondary | ICD-10-CM

## 2012-03-28 DIAGNOSIS — J449 Chronic obstructive pulmonary disease, unspecified: Secondary | ICD-10-CM

## 2012-03-28 DIAGNOSIS — F17200 Nicotine dependence, unspecified, uncomplicated: Secondary | ICD-10-CM

## 2012-03-28 DIAGNOSIS — R222 Localized swelling, mass and lump, trunk: Secondary | ICD-10-CM

## 2012-03-28 DIAGNOSIS — R918 Other nonspecific abnormal finding of lung field: Secondary | ICD-10-CM

## 2012-03-28 NOTE — Progress Notes (Signed)
  Subjective:    Patient ID: [name], male    DOB: 1950/09/16, 62 y.o.   MRN: [id number]  HPI  79 yowm active smoker disability since 1994 for cerebrovasc dz referred 03/28/12 by Dr Wynelle Cleveland for hemoptysis and lung mass.   03/28/2012  1s pulmonary eval/ Wert cc doe x 1 year = groceries in from car, hauling a bucket of water walking flat ok able to sweep and vacuum assoc with chronic cough  variably productive esp in am of white mucus turned red one week prior to OV  While on asa/plavix then turned white again, typically this cough is worse in winter. Total amt of blood maybe a half a cup, resolved off asa and plavix  No obvious daytime variabilty or assoc  cp or chest tightness, subjective wheeze overt epistaxis or hb symptoms. No unusual exp hx or h/o childhood pna/ asthma or premature birth to his knowledge.   Sleeping ok without nocturnal  or early am exacerbation  of respiratory  c/o's or need for noct saba. Also denies any obvious fluctuation of symptoms with weather or environmental changes or other aggravating or alleviating factors except as outlined above   Review of Systems  Constitutional: Negative for fever, chills, activity change, appetite change and unexpected weight change.  HENT: Positive for congestion. Negative for sore throat, rhinorrhea, sneezing, trouble swallowing, dental problem, voice change and postnasal drip.   Eyes: Negative for visual disturbance.  Respiratory: Positive for cough and shortness of breath. Negative for choking.   Cardiovascular: Negative for chest pain and leg swelling.  Gastrointestinal: Negative for nausea, vomiting and abdominal pain.  Genitourinary: Negative for difficulty urinating.  Musculoskeletal: Negative for arthralgias.  Skin: Negative for rash.  Psychiatric/Behavioral: Negative for behavioral problems and confusion.       Objective:   Physical Exam   Wt Readings from Last 3 Encounters:  03/28/12 149 lb 12.8 oz (67.949  kg)  03/21/12 148 lb (67.132 kg)  03/20/12 148 lb (67.132 kg)    HEENT mild turbinate edema.  Oropharynx no thrush or excess pnd or cobblestoning.  No JVD or cervical adenopathy. Mild accessory muscle hypertrophy. Trachea midline, nl thryroid. Chest was hyperinflated by percussion with diminished breath sounds and moderate increased exp time without wheeze. Hoover sign positive at mid inspiration. Regular rate and rhythm without murmur gallop or rub or increase P2 or edema.  Abd: no hsm, nl excursion. Ext warm without cyanosis or clubbing.    Ct chest  03/25/12>  1. 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 Left upper  lobe nodule may represent the primary lesion.  2. Narrowing of the left main pulmonary artery and left upper lobe  bronchi with mild postobstructive pneumonitis in the left upper  lobe.       Assessment & Plan:

## 2012-03-28 NOTE — Patient Instructions (Addendum)
Hold plavix for now  [facility] resume baby aspirin but stop immediately if bleeding starts and also stop aspirin after Sunday's dose  March 19h outpatient registration [facility] 7 15  am with nothing to eat after midnight Tuesday

## 2012-03-29 NOTE — Assessment & Plan Note (Signed)
Clinical dx only but likely present.  Note some of the cough and sob could be from ACE inhibitors but ok for now to leave on them, low threshold to change to ARB if symptoms worsen

## 2012-03-29 NOTE — Assessment & Plan Note (Signed)
Strongly rec he quit asap

## 2012-03-29 NOTE — Assessment & Plan Note (Signed)
Left hilar  mass noted on chest x-ray 03/20/2012 Ct chest  03/25/12>  1. 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 Left upper  lobe nodule may represent the primary lesion.  2. Narrowing of the left main pulmonary artery and left upper lobe  bronchi with mild postobstructive pneumonitis in the left upper  Lobe.  Assoc with hemoptysis on asa and plavix so likely has endobronchial component - unfortunately probably Stage III b by ct but first step is tissue dx.  Discussed in detail all the  indications, usual  risks and alternatives  relative to the benefits with patient who agrees to proceed with bronchoscopy with biopsy 3/19  In meantime ok to restart asa but stop p 3/16 and stop even this is hemoptysis recurs

## 2012-04-02 ENCOUNTER — Encounter (HOSPITAL_COMMUNITY): Payer: Self-pay

## 2012-04-03 ENCOUNTER — Ambulatory Visit (HOSPITAL_COMMUNITY)
Admission: RE | Admit: 2012-04-03 | Discharge: 2012-04-03 | Disposition: A | Discharge status: Home / Self Care | Payer: Medicare Other | Source: Ambulatory Visit | Attending: Internal Medicine | Admitting: Internal Medicine

## 2012-04-03 ENCOUNTER — Encounter (HOSPITAL_COMMUNITY)
Admission: RE | Disposition: A | Discharge status: Home / Self Care | Payer: Self-pay | Source: Ambulatory Visit | Attending: Internal Medicine

## 2012-04-03 DIAGNOSIS — C3490 Malignant neoplasm of unspecified part of unspecified bronchus or lung: Secondary | ICD-10-CM

## 2012-04-03 DIAGNOSIS — R918 Other nonspecific abnormal finding of lung field: Secondary | ICD-10-CM

## 2012-04-03 DIAGNOSIS — R599 Enlarged lymph nodes, unspecified: Secondary | ICD-10-CM | POA: Insufficient documentation

## 2012-04-03 DIAGNOSIS — I679 Cerebrovascular disease, unspecified: Secondary | ICD-10-CM | POA: Insufficient documentation

## 2012-04-03 DIAGNOSIS — F17200 Nicotine dependence, unspecified, uncomplicated: Secondary | ICD-10-CM | POA: Insufficient documentation

## 2012-04-03 DIAGNOSIS — C3410 Malignant neoplasm of upper lobe, unspecified bronchus or lung: Secondary | ICD-10-CM | POA: Insufficient documentation

## 2012-04-03 DIAGNOSIS — R222 Localized swelling, mass and lump, trunk: Secondary | ICD-10-CM

## 2012-04-03 HISTORY — DX: Malignant neoplasm of unspecified part of unspecified bronchus or lung: C34.90

## 2012-04-03 HISTORY — PX: VIDEO BRONCHOSCOPY: SHX5072

## 2012-04-03 SURGERY — VIDEO BRONCHOSCOPY WITHOUT FLUORO
Anesthesia: Moderate Sedation | Laterality: Bilateral

## 2012-04-03 MED ORDER — SODIUM CHLORIDE 0.9 % IV SOLN
INTRAVENOUS | Status: DC
  Administered 2012-04-03: 08:00:00 via INTRAVENOUS

## 2012-04-03 MED ORDER — MEPERIDINE HCL 25 MG/ML IJ SOLN
INTRAMUSCULAR | Status: DC | PRN
  Administered 2012-04-03: 50 mg via INTRAVENOUS

## 2012-04-03 MED ORDER — LIDOCAINE HCL 2 % EX GEL
CUTANEOUS | Status: DC | PRN
  Administered 2012-04-03: 1

## 2012-04-03 MED ORDER — MIDAZOLAM HCL 10 MG/2ML IJ SOLN: 1.0000 mg | Freq: Once | INTRAMUSCULAR | Status: DC

## 2012-04-03 MED ORDER — MEPERIDINE HCL 100 MG/ML IJ SOLN: 100.0000 mg | Freq: Once | INTRAMUSCULAR | Status: DC

## 2012-04-03 MED ORDER — MIDAZOLAM HCL 10 MG/2ML IJ SOLN
INTRAMUSCULAR | Status: AC
  Filled 2012-04-03: qty 4

## 2012-04-03 MED ORDER — LIDOCAINE HCL 2 % EX GEL
Freq: Once | CUTANEOUS | Status: DC
  Filled 2012-04-03: qty 5

## 2012-04-03 MED ORDER — MEPERIDINE HCL 100 MG/ML IJ SOLN
INTRAMUSCULAR | Status: AC
  Filled 2012-04-03: qty 2

## 2012-04-03 MED ORDER — PHENYLEPHRINE HCL 0.25 % NA SOLN
NASAL | Status: DC | PRN
  Administered 2012-04-03: 2 via NASAL

## 2012-04-03 MED ORDER — LIDOCAINE HCL (PF) 1 % IJ SOLN
INTRAMUSCULAR | Status: DC | PRN
  Administered 2012-04-03: 6 mL

## 2012-04-03 MED ORDER — EPINEPHRINE HCL 0.1 MG/ML IJ SOLN
INTRAMUSCULAR | Status: DC | PRN
  Administered 2012-04-03: 1 mg via INTRAVENOUS

## 2012-04-03 MED ORDER — MIDAZOLAM HCL 10 MG/2ML IJ SOLN
INTRAMUSCULAR | Status: DC | PRN
  Administered 2012-04-03 (×2): 2.5 mg via INTRAVENOUS

## 2012-04-03 NOTE — Progress Notes (Signed)
Video Bronchoscopy Done  Procedure tolerated well  Bronchial washings done Bronchial biopsy done

## 2012-04-03 NOTE — H&P (Signed)
HPI   71 yowm active smoker disability since 1994 for cerebrovasc dz referred 03/28/12 by Dr Wynelle Cleveland for hemoptysis and lung mass.     03/28/2012  1s pulmonary eval/ Wert cc doe x 1 year = groceries in from car, hauling a bucket of water walking flat ok able to sweep and vacuum assoc with chronic cough  variably productive esp in am of white mucus turned red one week prior to OV  While on asa/plavix then turned white again, typically this cough is worse in winter. Total amt of blood maybe a half a cup, resolved off asa and plavix  04/03/2012 FOB preop no change in hx .   No obvious daytime variabilty or assoc  cp or chest tightness, subjective wheeze overt epistaxis or hb symptoms. No unusual exp hx or h/o childhood pna/ asthma or premature birth to his knowledge.    Sleeping ok without nocturnal  or early am exacerbation  of respiratory  c/o's or need for noct saba. Also denies any obvious fluctuation of symptoms with weather or environmental changes or other aggravating or alleviating factors except as outlined above     Review of Systems  Constitutional: Negative for fever, chills, activity change, appetite change and unexpected weight change.  HENT: Positive for congestion. Negative for sore throat, rhinorrhea, sneezing, trouble swallowing, dental problem, voice change and postnasal drip.   Eyes: Negative for visual disturbance.  Respiratory: Positive for cough and shortness of breath. Negative for choking.   Cardiovascular: Negative for chest pain and leg swelling.  Gastrointestinal: Negative for nausea, vomiting and abdominal pain.  Genitourinary: Negative for difficulty urinating.  Musculoskeletal: Negative for arthralgias.  Skin: Negative for rash.  Psychiatric/Behavioral: Negative for behavioral problems and confusion.          Objective:     Physical Exam     04/03/2012 no change on exam Wt Readings from Last 3 Encounters:   03/28/12  149 lb 12.8 oz (67.949 kg)    03/21/12  148 lb (67.132 kg)   03/20/12  148 lb (67.132 kg)       HEENT mild turbinate edema.  Oropharynx no thrush or excess pnd or cobblestoning.  No JVD or cervical adenopathy. Mild accessory muscle hypertrophy. Trachea midline, nl thryroid. Chest was hyperinflated by percussion with diminished breath sounds and moderate increased exp time without wheeze. Hoover sign positive at mid inspiration. Regular rate and rhythm without murmur gallop or rub or increase P2 or edema.  Abd: no hsm, nl excursion. Ext warm without cyanosis or clubbing.     Ct chest  03/25/12>   1. 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 Left upper   lobe nodule may represent the primary lesion.   2. Narrowing of the left main pulmonary artery and left upper lobe   bronchi with mild postobstructive pneumonitis in the left upper   lobe.           Assessment & Plan:                 Revision History       Date/Time User Action    > 03/29/2012  8:20 AM Nyoka Cowden, MD Signed      03/28/2012  2:35 PM Nyoka Cowden, MD Sign at close encounter      03/28/2012  2:28 PM Christen Butter, CMA Sign at close encounter  Hilar mass - Nyoka Cowden, MD at 03/29/2012  8:18 AM    Status: Written Related Problem: Hilar mass           Left hilar  mass noted on chest x-ray 03/20/2012 Ct chest  03/25/12>   1. 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 Left upper   lobe nodule may represent the primary lesion.   2. Narrowing of the left main pulmonary artery and left upper lobe   bronchi with mild postobstructive pneumonitis in the left upper   Lobe.   Assoc with hemoptysis on asa and plavix so likely has endobronchial component - unfortunately probably Stage III b by ct but first step is tissue dx.  Discussed in detail all the  indications, usual  risks and  alternatives  relative to the benefits with patient who agrees to proceed with bronchoscopy with biopsy 3/19   In meantime ok to restart asa but stop p 3/16 and stop even this is hemoptysis recurs           COPD - Nyoka Cowden, MD at 03/29/2012  8:19 AM    Status: Written Related Problem: COPD           Clinical dx only but likely present.  Note some of the cough and sob could be from ACE inhibitors but ok for now to leave on them, low threshold to change to ARB if symptoms worsen         Tobacco abuse - Nyoka Cowden, MD at 03/29/2012  8:20 AM    Status: Written Related Problem: Tobacco abuse           Strongly rec he quit asap

## 2012-04-03 NOTE — Op Note (Signed)
Bronchoscopy Procedure Note  Date of Operation: 04/03/2012   Pre-op Diagnosis: Lung mass  Post-op Diagnosis: Same  Surgeon: Sandrea Hughs  Anesthesia: Monitored Local Anesthesia with Sedation  Operation: Video Flexible fiberoptic bronchoscopy, diagnostic   Findings: Endobronchial tumor at LUL orifice 75% obst at segmental level   Specimen: Washings/endobronchial bx  Estimated Blood Loss: 25 cc  Complications: minimal bleeding,  Controlled with epi x one   Indications and History: See updated H and P same date. The risks, benefits, complications, treatment options and expected outcomes were discussed with the patient.  The possibilities of reaction to medication, pulmonary aspiration, perforation of a viscus, bleeding, failure to diagnose a condition and creating a complication requiring transfusion or operation were discussed with the patient who freely signed the consent.    Description of Procedure: The patient was re-examined in the bronchoscopy suite and the site of surgery properly noted/marked.  The patient was identified  and the procedure verified as Flexible Video Fiberoptic Bronchoscopy.  A Time Out was held and the above information confirmed.   After the induction of topical nasopharyngeal anesthesia, the patient was positioned  and the bronchoscope was passed through the R naris. The vocal cords were visualized and  1% buffered lidocaine 5 ml was topically placed onto the cords. The cords were nl. The scope was then passed into the trachea.  1% buffered lidocaine given topically. Airways inspected bilaterally to the subsegmental level with the following findings:  Airways nl x LUL AP segment 75% obstructed with heaped up friable tissue c/w tumor  Procedure 1) Lavage LUL 2) Endobronchial bx LUL x 2 , adequate tissue     The Patient was taken to the Endoscopy Recovery area in satisfactory condition.  Attestation: I performed the procedure.  Sandrea Hughs,  MD Pulmonary and Critical Care Medicine [facility] Cell [phone] After 5:30 PM or weekends, call [phone]

## 2012-04-04 ENCOUNTER — Encounter: Payer: Self-pay | Admitting: Internal Medicine

## 2012-04-04 ENCOUNTER — Encounter: Payer: Medicare Other | Admitting: Internal Medicine

## 2012-04-04 ENCOUNTER — Telehealth: Payer: Self-pay | Admitting: Internal Medicine

## 2012-04-04 ENCOUNTER — Encounter (HOSPITAL_COMMUNITY): Payer: Self-pay | Admitting: Internal Medicine

## 2012-04-04 DIAGNOSIS — R918 Other nonspecific abnormal finding of lung field: Secondary | ICD-10-CM

## 2012-04-04 NOTE — Telephone Encounter (Signed)
Discussed dx sq cell ca with pt, needs pet next

## 2012-04-05 ENCOUNTER — Other Ambulatory Visit: Payer: Self-pay | Admitting: Internal Medicine

## 2012-04-10 ENCOUNTER — Encounter (HOSPITAL_COMMUNITY)
Admission: RE | Admit: 2012-04-10 | Discharge: 2012-04-10 | Disposition: A | Discharge status: Home / Self Care | Payer: Medicare Other | Source: Ambulatory Visit | Attending: Internal Medicine | Admitting: Internal Medicine

## 2012-04-10 ENCOUNTER — Encounter (HOSPITAL_COMMUNITY): Payer: Self-pay

## 2012-04-10 DIAGNOSIS — C3490 Malignant neoplasm of unspecified part of unspecified bronchus or lung: Secondary | ICD-10-CM | POA: Insufficient documentation

## 2012-04-10 DIAGNOSIS — R599 Enlarged lymph nodes, unspecified: Secondary | ICD-10-CM | POA: Insufficient documentation

## 2012-04-10 DIAGNOSIS — R222 Localized swelling, mass and lump, trunk: Secondary | ICD-10-CM | POA: Insufficient documentation

## 2012-04-10 DIAGNOSIS — C772 Secondary and unspecified malignant neoplasm of intra-abdominal lymph nodes: Secondary | ICD-10-CM | POA: Insufficient documentation

## 2012-04-10 DIAGNOSIS — R911 Solitary pulmonary nodule: Secondary | ICD-10-CM | POA: Insufficient documentation

## 2012-04-10 LAB — GLUCOSE, CAPILLARY: Glucose-Capillary: 87 mg/dL (ref 70–99)

## 2012-04-10 MED ORDER — FLUDEOXYGLUCOSE F - 18 (FDG) INJECTION
19.3000 | Freq: Once | INTRAVENOUS | Status: AC | PRN
  Administered 2012-04-10: 19.3 via INTRAVENOUS

## 2012-04-11 ENCOUNTER — Encounter: Payer: Self-pay | Admitting: Internal Medicine

## 2012-04-12 ENCOUNTER — Other Ambulatory Visit: Payer: Self-pay | Admitting: Internal Medicine

## 2012-04-12 ENCOUNTER — Telehealth: Payer: Self-pay | Admitting: Internal Medicine

## 2012-04-12 NOTE — Telephone Encounter (Signed)
Called and spoke with pts wife and she stated that MW just spoke with pt about some results and she would like to speak with MW directly.  She wanted some clarification.  MW please advise.   pts wife can be reached at the number listed above.  Thanks  No Known Allergies

## 2012-04-12 NOTE — Telephone Encounter (Signed)
Referred to [facility]

## 2012-04-15 ENCOUNTER — Telehealth: Payer: Self-pay | Admitting: Internal Medicine

## 2012-04-15 ENCOUNTER — Telehealth: Payer: Self-pay | Admitting: *Deleted

## 2012-04-15 NOTE — Telephone Encounter (Signed)
Spoke with pt regarding appt time and place.  04/16/12 at 3:00 labs and 3:15 Dr. [name].  He verbalized understanding of appt

## 2012-04-15 NOTE — Telephone Encounter (Signed)
PT SCHEDULED PER DANA.  °

## 2012-04-16 ENCOUNTER — Encounter: Payer: Self-pay | Admitting: Internal Medicine

## 2012-04-16 ENCOUNTER — Ambulatory Visit (HOSPITAL_BASED_OUTPATIENT_CLINIC_OR_DEPARTMENT_OTHER): Payer: Medicare Other | Admitting: Internal Medicine

## 2012-04-16 ENCOUNTER — Ambulatory Visit: Payer: Medicare Other

## 2012-04-16 ENCOUNTER — Other Ambulatory Visit (HOSPITAL_BASED_OUTPATIENT_CLINIC_OR_DEPARTMENT_OTHER): Payer: Medicare Other | Admitting: Lab

## 2012-04-16 ENCOUNTER — Telehealth: Payer: Self-pay | Admitting: Internal Medicine

## 2012-04-16 DIAGNOSIS — I10 Essential (primary) hypertension: Secondary | ICD-10-CM

## 2012-04-16 DIAGNOSIS — F17200 Nicotine dependence, unspecified, uncomplicated: Secondary | ICD-10-CM

## 2012-04-16 DIAGNOSIS — C778 Secondary and unspecified malignant neoplasm of lymph nodes of multiple regions: Secondary | ICD-10-CM

## 2012-04-16 DIAGNOSIS — C3410 Malignant neoplasm of upper lobe, unspecified bronchus or lung: Secondary | ICD-10-CM

## 2012-04-16 LAB — COMPREHENSIVE METABOLIC PANEL (CC13)
ALT: <6 U/L (ref 0–55)
AST: 9 U/L (ref 5–34)
Albumin: 3.2 g/dL — ABNORMAL LOW (ref 3.5–5.0)
Calcium: 9 mg/dL (ref 8.4–10.4)
Chloride: 100 mEq/L (ref 98–107)
Potassium: 4 mEq/L (ref 3.5–5.1)
Sodium: 133 mEq/L — ABNORMAL LOW (ref 136–145)

## 2012-04-16 LAB — CBC WITH DIFFERENTIAL/PLATELET
BASO%: 0.2 % (ref 0.0–2.0)
EOS%: 4.3 % (ref 0.0–7.0)
MCH: 29.1 pg (ref 27.2–33.4)
MCHC: 33 g/dL (ref 32.0–36.0)
RDW: 14.8 % — ABNORMAL HIGH (ref 11.0–14.6)
lymph#: 2.2 10*3/uL (ref 0.9–3.3)

## 2012-04-16 MED ORDER — PROCHLORPERAZINE MALEATE 10 MG PO TABS: 10.0000 mg | ORAL_TABLET | Freq: Four times a day (QID) | ORAL | Status: DC | PRN

## 2012-04-16 NOTE — Progress Notes (Signed)
[facility] [phone]   [phone]) [phone]  CONSULT NOTE  REFERRING PHYSICIAN: Dr. Sandrea Hughs  REASON FOR CONSULTATION:  62 years old white male recently diagnosed with lung cancer.  HPI [name] is a 62 y.o. male with past medical history significant for hypertension, dyslipidemia, COPD, history of stroke in 1996, erectile dysfunction, testosterone deficiency, benign prostatic hypertrophy, peripheral vascular disease, history of brain aneurysm, and history of pernicious anemia. The patient also has long history of smoking. In early March of 2014 the patient presented to his primary care physician complaining of cough, shortness of breath as well as hemoptysis. Chest x-ray on 03/21/2012 showed abnormal appearance of the left hilum highly suspicious for hilar mass or lymphadenopathy. This was followed by CT scan of the chest with contrast on 03/26/2012  and it showed prevascular and AP window adenopathy which extends to the left hilum. AP window nodal mass measures 4.6 x 7.8 cm and narrows the left main pulmonary artery. A lymph node anterior to the right mainstem bronchus measures 6 mm. There is an irregular nodule in the left upper lobe, with ground-glass and solid components, measuring 2.2 x 1.2 cm. The patient was referred to Dr. Sherene Sires and on 04/03/2012 he underwent bronchoscopy with endobronchial biopsies of the left upper lobe as well as lavage of the left upper lobe. The final pathology (Accession #: [id number]) was consistent with squamous cell carcinoma. A PET scan on 04/12/2012 showed hypermetabolic left suprahilar mass consistent with bronchogenic carcinoma. There was also small hypermetabolic left upper lobe pulmonary nodule that could represent a primary bronchogenic carcinoma in addition to hypermetabolic prevascular lymph node on the left as well as hypermetabolic left axillary lymph node consistent with metastasis. There was evidence for metastasis  within the small periportal lymph node.  Dr. Sherene Sires kindly referred the patient to me today for evaluation and recommendation regarding treatment of his condition. When seen today the patient continues to complain of cough productive of yellowish sputum as well as shortness breath with exertion but no significant chest pain or any current hemoptysis. He denied having any significant weight loss or night sweats. He denied having any visual changes or headache.   @SFHPI @  Past Medical History  Diagnosis Date  . BENIGN PROSTATIC HYPERTROPHY 12/24/2009  . CEREBROVASCULAR ACCIDENT, HX OF 12/24/2009  . HYPERLIPIDEMIA 12/24/2009  . HYPERTENSION 12/24/2009  . Testosterone deficiency   . ED (erectile dysfunction)   . Claudication in peripheral vascular disease, lifestyle limiting, with abnormal arterial dopplers of lower ext. 06/30/2011  . S/P angioplasty with stent to Lt. SFA 06/30/11 06/30/2011  . COPD 12/24/2009  . Shortness of breath     "from the COPD" (10/13/2011)  . Stroke 1996    "right side not as strong as left since; numbness R leg/foot" (10/13/2011)  . Pernicious anemia     Past Surgical History  Procedure Laterality Date  . Cervical fusion  1980's    "total of 3-4 OR's on my neck; pinched nerve" (10/13/2011)  . Aneurysm coiling  2007  . Peripheral arterial stent graft  06/30/2010; 10/13/10    left; left  . Video bronchoscopy Bilateral 04/03/2012    Procedure: VIDEO BRONCHOSCOPY WITHOUT FLUORO;  Surgeon: Nyoka Cowden, MD;  Location: [facility];  Service: Cardiopulmonary;  Laterality: Bilateral;    Family History  Problem Relation Age of Onset  . Emphysema Father     smoked  . Emphysema Paternal Uncle     smoked  . Heart  disease Mother   . Lung cancer Father     smoked  . Lung cancer Paternal Grandfather     smoked    Social History History  Substance Use Topics  . Smoking status: Current Every Day Smoker -- 1.00 packs/day for 45 years    Types: Cigarettes  . Smokeless  tobacco: Never Used  . Alcohol Use: No    No Known Allergies  Current Outpatient Prescriptions  Medication Sig Dispense Refill  . aspirin 81 MG tablet Take 81 mg by mouth daily.      Marland Kitchen atorvastatin (LIPITOR) 20 MG tablet Take 1 tablet (20 mg total) by mouth daily.  90 tablet  3  . gabapentin (NEURONTIN) 600 MG tablet TAKE 1 TABLET DAILY  90 tablet  3  . lisinopril (PRINIVIL,ZESTRIL) 20 MG tablet TAKE 1 TABLET DAILY  90 tablet  3  . nortriptyline (PAMELOR) 75 MG capsule TAKE 1 CAPSULE DAILY  90 capsule  3  . tamsulosin (FLOMAX) 0.4 MG CAPS TAKE 1 CAPSULE DAILY  90 capsule  3  . acetaminophen (TYLENOL) 500 MG tablet Take 1,000 mg by mouth every 6 (six) hours as needed. For pain      . clopidogrel (PLAVIX) 75 MG tablet Take 75 mg by mouth daily.      . prochlorperazine (COMPAZINE) 10 MG tablet Take 1 tablet (10 mg total) by mouth every 6 (six) hours as needed.  60 tablet  0   No current facility-administered medications for this visit.    Review of Systems  A comprehensive review of systems was negative except for: Constitutional: positive for fatigue Respiratory: positive for cough, dyspnea on exertion, sputum and wheezing  Physical Exam  ZOX:WRUEA, healthy, no distress, well nourished and well developed SKIN: skin color, texture, turgor are normal HEAD: Normocephalic, No masses, lesions, tenderness or abnormalities EYES: normal, PERRLA EARS: External ears normal OROPHARYNX:no exudate and no erythema  NECK: supple, no adenopathy LYMPH:  no palpable lymphadenopathy, no hepatosplenomegaly LUNGS: expiratory wheezes bilaterally HEART: regular rate & rhythm, no murmurs and no gallops ABDOMEN:abdomen soft, non-tender, normal bowel sounds and no masses or organomegaly BACK: Back symmetric, no curvature. EXTREMITIES:no joint deformities, effusion, or inflammation, no edema  NEURO: alert & oriented x 3 with fluent speech, no focal motor/sensory deficits  PERFORMANCE STATUS: ECOG  1  LABORATORY DATA: Lab Results  Component Value Date   WBC 11.3* 04/16/2012   HGB 12.4* 04/16/2012   HCT 37.6* 04/16/2012   MCV 88.2 04/16/2012   PLT 435* 04/16/2012      Chemistry      Component Value Date/Time   NA 133* 04/16/2012 1501   NA 133* 10/14/2011 0725   K 4.0 04/16/2012 1501   K 4.1 10/14/2011 0725   CL 100 04/16/2012 1501   CL 99 10/14/2011 0725   CO2 23 04/16/2012 1501   CO2 24 10/14/2011 0725   BUN 5.7* 04/16/2012 1501   BUN 8 10/14/2011 0725   CREATININE 0.8 04/16/2012 1501   CREATININE 0.78 10/14/2011 0725      Component Value Date/Time   CALCIUM 9.0 04/16/2012 1501   CALCIUM 9.2 10/14/2011 0725   ALKPHOS 141 04/16/2012 1501   ALKPHOS 79 10/19/2010 0812   AST 9 04/16/2012 1501   AST 10 10/19/2010 0812   ALT <6 Repeated and Verified 04/16/2012 1501   ALT 8 10/19/2010 0812   BILITOT 0.41 04/16/2012 1501   BILITOT 0.3 10/19/2010 0812       RADIOGRAPHIC STUDIES: Dg Chest 2 View  03/20/2012  *RADIOLOGY REPORT*  Clinical Data: 62 year old male with hemoptysis.  Shortness of breath.  Smoker.  CHEST - 2 VIEW  Comparison: 10/06/2010 and earlier.  Findings: The left hilum is now abnormally hyperdense and shows increased size on the lateral view.  The normal aorticopulmonary contour is lost, and the lateral aspect of the hilum now appears convex.  No definite nearby discrete lung masses identified.  Other cardiac mediastinal contours appear stable. Visualized tracheal air column is within normal limits.  No pneumothorax, pulmonary edema or pleural effusion. No acute osseous abnormality identified.  IMPRESSION: Abnormal appearance of the left hilum highly suspicious for hilar mass or lymphadenopathy in this setting (such as due to bronchogenic carcinoma).  Recommend follow-up chest CT (IV contrast) to evaluate further.  These results will be called to the ordering clinician or representative by the Radiologist Assistant, and communication documented in the PACS Dashboard.   Original Report Authenticated By:  Erskine Speed, M.D.    Ct Chest W Contrast  03/25/2012  *RADIOLOGY REPORT*  Clinical Data: Left hilar mass and chest radiograph.  Smoker.  CT CHEST WITH CONTRAST  Technique:  Multidetector CT imaging of the chest was performed following the standard protocol during bolus administration of intravenous contrast.  Contrast: 80mL OMNIPAQUE IOHEXOL 300 MG/ML  SOLN  Comparison: Chest radiograph 03/20/2012.  Findings: There is prevascular and AP window adenopathy which extends to the left hilum.  AP window nodal mass measures 4.6 x 7.8 cm and narrows the left main pulmonary artery.  A lymph node anterior to the right mainstem bronchus measures 6 mm.  No axillary adenopathy.  Atherosclerotic calcification of the arterial vasculature, including coronary arteries.  Heart size normal.  No pericardial effusion.  There is an irregular nodule in the left upper lobe, with ground- glass and solid components, measuring 2.2 x 1.2 cm.  Underlying centrilobular emphysema with mild ground-glass and septal thickening in the left upper lobe.  Additional scattered tiny nodular densities in the lungs are nonspecific.  No pleural fluid. There is narrowing of the left upper lobe bronchi.  Airway is otherwise unremarkable.  Incidental imaging of the upper abdomen shows no acute findings. Volume averaging with adjacent vessels simulates the appearance of a stone in the gallbladder (image 63).  No worrisome lytic or sclerotic lesions.  IMPRESSION:  1.  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 Left upper lobe nodule may represent the primary lesion. 2.  Narrowing of the left main pulmonary artery and left upper lobe bronchi with mild postobstructive pneumonitis in the left upper lobe.   Original Report Authenticated By: Leanna Battles, M.D.    Nm Pet Image Initial (pi) Skull Base To Thigh  04/10/2012  *RADIOLOGY REPORT*  Clinical Data: Initial treatment strategy for lung  cancer.  NUCLEAR MEDICINE PET SKULL BASE TO THIGH  Fasting Blood Glucose:  87  Technique:  19.3 mCi F-18 FDG was injected intravenously.   CT data was obtained and used for attenuation correction and anatomic localization only.  (This was not acquired as a diagnostic CT examination.) Additional exam technical data entered  on technologist worksheet.  Comparison: CT 03/25/2012  Findings:  Neck: No hypermetabolic nodes in the neck.  Chest: Mass-like soft tissue thickening in the left suprahilar region which is intensely hypermetabolic with SUV max =  25.7. This lesion is poorly defined on this noncontrast exam but measures 7.7 x 4.5 cm on comparison contrast CT scan.  There is a small  hypermetabolic left upper lobe pulmonary nodule measuring 8 mm (image 61) with SUV max = 2.5.  Hypermetabolic adenopathy extends into the prevascular space on the left with intense hypermetabolic node on image 57 measuring 7 mm on image 59.  There is hypermetabolic left axillary lymph node measuring 9 mm (image 61) with SUV max = 4.3.  Abdomen / Pelvis:There is hypermetabolic periportal lymph node measuring 9 mm (image 139) with SUV max = 5.0.  No additional hypermetabolic abdominal pelvic lymph nodes.  Skeleton:No focal hypermetabolic activity to suggest skeletal metastasis.  IMPRESSION:  1.  Hypermetabolic left suprahilar mass consistent with bronchogenic carcinoma. Consider small cell carcinoma. 2. Small hypermetabolic left upper lobe pulmonary nodule could represent primary. 3.  Hypermetabolic prevascular lymph node on the left as well as hypermetabolic left axillary lymph node consistent with metastasis. 4.  Evidence for  metastasis within the small periportal lymph node.   Original Report Authenticated By: Genevive Bi, M.D.     ASSESSMENT: this is a very pleasant 62 years old white male recently diagnosed with metastatic non-small cell lung cancer, squamous cell carcinoma with large left suprahilar mass in addition to  mediastinal as well as axillary and abdominal lymphadenopathy.   PLAN: I had a lengthy discussion with the patient and his wife today about his disease stage, prognosis and treatment options. I recommended for the patient treatment with systemic chemotherapy with carboplatin for AUC of 5 on day 1 and Abraxane 100 mg/M2 on days 1, 8 and 15 every 3 weeks. I also gave the patient the option of palliative care and hospice referral but he is interested in proceeding with the chemotherapy. I discussed with the patient adverse effect of the chemotherapy including but not limited to alopecia, suppression, nausea vomiting, peripheral neuropathy, liver or in dysfunction. I will complete the staging workup by ordering CT scan of the head with contrast to rule out any brain metastasis. The patient may not be able to get MRI of the brain because of his previous brain aneurysm intervention. He would have a chemotherapy education class before starting the first cycle of his chemotherapy next week.  I would call his pharmacy was prescription for Compazine 10 mg by mouth every 6 hours as needed for nausea. The patient would come back for followup visit in 2 weeks for evaluation and management any adverse effect of his chemotherapy. He was advised to call immediately if he has any concerning symptoms in the interval.   All questions were answered. The patient knows to call the clinic with any problems, questions or concerns. We can certainly see the patient much sooner if necessary.  Thank you so much for allowing me to participate in the care of [name]. I will continue to follow up the patient with you and assist in his care.  I spent 30 minutes counseling the patient face to face. The total time spent in the appointment was 60 minutes.  Ladarrell Cornwall K. 04/16/2012, 4:19 PM

## 2012-04-16 NOTE — Patient Instructions (Signed)
You are recently diagnosed with metastatic non-small cell lung cancer, squamous cell carcinoma. We discussed treatment options including palliative care and hospice referral versus systemic chemotherapy with carboplatin and Abraxane. Your interested in systemic chemotherapy and first cycle expected next week.

## 2012-04-17 ENCOUNTER — Encounter (HOSPITAL_COMMUNITY): Payer: Self-pay

## 2012-04-17 ENCOUNTER — Other Ambulatory Visit: Payer: Self-pay | Admitting: Internal Medicine

## 2012-04-17 ENCOUNTER — Ambulatory Visit (HOSPITAL_COMMUNITY)
Admission: RE | Admit: 2012-04-17 | Discharge: 2012-04-17 | Disposition: A | Discharge status: Home / Self Care | Payer: Medicare Other | Source: Ambulatory Visit | Attending: Internal Medicine | Admitting: Internal Medicine

## 2012-04-17 ENCOUNTER — Telehealth: Payer: Self-pay | Admitting: *Deleted

## 2012-04-17 DIAGNOSIS — G319 Degenerative disease of nervous system, unspecified: Secondary | ICD-10-CM | POA: Insufficient documentation

## 2012-04-17 DIAGNOSIS — I672 Cerebral atherosclerosis: Secondary | ICD-10-CM | POA: Insufficient documentation

## 2012-04-17 DIAGNOSIS — C3490 Malignant neoplasm of unspecified part of unspecified bronchus or lung: Secondary | ICD-10-CM | POA: Insufficient documentation

## 2012-04-17 DIAGNOSIS — Z8673 Personal history of transient ischemic attack (TIA), and cerebral infarction without residual deficits: Secondary | ICD-10-CM | POA: Insufficient documentation

## 2012-04-17 DIAGNOSIS — F17200 Nicotine dependence, unspecified, uncomplicated: Secondary | ICD-10-CM | POA: Insufficient documentation

## 2012-04-17 MED ORDER — IOHEXOL 300 MG/ML  SOLN
100.0000 mL | Freq: Once | INTRAMUSCULAR | Status: AC | PRN
  Administered 2012-04-17: 100 mL via INTRAVENOUS

## 2012-04-17 NOTE — Telephone Encounter (Signed)
i will forward as an [place].Carron Curie, CMA

## 2012-04-17 NOTE — Telephone Encounter (Signed)
Per staff message and POF I have scheduled appts.  JMW  

## 2012-04-18 ENCOUNTER — Encounter: Payer: Self-pay | Admitting: *Deleted

## 2012-04-18 ENCOUNTER — Other Ambulatory Visit: Payer: Medicare Other

## 2012-04-25 ENCOUNTER — Ambulatory Visit (HOSPITAL_BASED_OUTPATIENT_CLINIC_OR_DEPARTMENT_OTHER): Payer: Medicare Other

## 2012-04-25 ENCOUNTER — Other Ambulatory Visit (HOSPITAL_BASED_OUTPATIENT_CLINIC_OR_DEPARTMENT_OTHER): Payer: Medicare Other

## 2012-04-25 DIAGNOSIS — C778 Secondary and unspecified malignant neoplasm of lymph nodes of multiple regions: Secondary | ICD-10-CM

## 2012-04-25 DIAGNOSIS — C3410 Malignant neoplasm of upper lobe, unspecified bronchus or lung: Secondary | ICD-10-CM

## 2012-04-25 DIAGNOSIS — Z5111 Encounter for antineoplastic chemotherapy: Secondary | ICD-10-CM

## 2012-04-25 LAB — COMPREHENSIVE METABOLIC PANEL (CC13)
ALT: 8 U/L (ref 0–55)
AST: 10 U/L (ref 5–34)
Calcium: 9 mg/dL (ref 8.4–10.4)
Chloride: 99 mEq/L (ref 98–107)
Creatinine: 0.8 mg/dL (ref 0.7–1.3)

## 2012-04-25 LAB — CBC WITH DIFFERENTIAL/PLATELET
BASO%: 1.7 % (ref 0.0–2.0)
EOS%: 5.7 % (ref 0.0–7.0)
HCT: 37.1 % — ABNORMAL LOW (ref 38.4–49.9)
MCH: 29.2 pg (ref 27.2–33.4)
MCHC: 33.4 g/dL (ref 32.0–36.0)
NEUT%: 52.2 % (ref 39.0–75.0)
RDW: 14.5 % (ref 11.0–14.6)
lymph#: 1.9 10*3/uL (ref 0.9–3.3)

## 2012-04-25 MED ORDER — SODIUM CHLORIDE 0.9 % IV SOLN
Freq: Once | INTRAVENOUS | Status: AC
  Administered 2012-04-25: 13:00:00 via INTRAVENOUS

## 2012-04-25 MED ORDER — PACLITAXEL PROTEIN-BOUND CHEMO INJECTION 100 MG
100.0000 mg/m2 | Freq: Once | INTRAVENOUS | Status: AC
  Administered 2012-04-25: 175 mg via INTRAVENOUS
  Filled 2012-04-25: qty 35

## 2012-04-25 MED ORDER — SODIUM CHLORIDE 0.9 % IJ SOLN
10.0000 mL | INTRAMUSCULAR | Status: DC | PRN
  Filled 2012-04-25: qty 10

## 2012-04-25 MED ORDER — SODIUM CHLORIDE 0.9 % IV SOLN
580.0000 mg | Freq: Once | INTRAVENOUS | Status: AC
  Administered 2012-04-25: 580 mg via INTRAVENOUS
  Filled 2012-04-25: qty 58

## 2012-04-25 MED ORDER — DEXAMETHASONE SODIUM PHOSPHATE 4 MG/ML IJ SOLN
20.0000 mg | Freq: Once | INTRAMUSCULAR | Status: AC
  Administered 2012-04-25: 20 mg via INTRAVENOUS

## 2012-04-25 MED ORDER — ONDANSETRON 16 MG/50ML IVPB (CHCC)
16.0000 mg | Freq: Once | INTRAVENOUS | Status: AC
  Administered 2012-04-25: 16 mg via INTRAVENOUS

## 2012-04-25 NOTE — Patient Instructions (Signed)
[facility] Discharge Instructions for Patients Receiving Chemotherapy  Today you received the following chemotherapy agents: Abraxane and Carboplatin.  To help prevent nausea and vomiting after your treatment, we encourage you to take your nausea medication, Compazine (Prochlorperazine). Take one every six hours as needed for nausea. If you develop nausea and vomiting that is not controlled by your nausea medication, call the clinic. If it is after clinic hours your family physician or the after hours number for the clinic or go to the Emergency Department.   BELOW ARE SYMPTOMS THAT SHOULD BE REPORTED IMMEDIATELY:  *FEVER GREATER THAN 100.5 F  *CHILLS WITH OR WITHOUT FEVER  NAUSEA AND VOMITING THAT IS NOT CONTROLLED WITH YOUR NAUSEA MEDICATION  *UNUSUAL SHORTNESS OF BREATH  *UNUSUAL BRUISING OR BLEEDING  TENDERNESS IN MOUTH AND THROAT WITH OR WITHOUT PRESENCE OF ULCERS  *URINARY PROBLEMS  *BOWEL PROBLEMS  UNUSUAL RASH Items with * indicate a potential emergency and should be followed up as soon as possible.  One of the nurses will contact you 24 hours after your treatment. Please let the nurse know about any problems that you may have experienced. Feel free to call the clinic you have any questions or concerns. The clinic phone number is [phone].   I have been informed and understand all the instructions given to me. I know to contact the clinic, my physician, or go to the Emergency Department if any problems should occur. I do not have any questions at this time, but understand that I may call the clinic during office hours   should I have any questions or need assistance in obtaining follow up care.

## 2012-04-26 ENCOUNTER — Telehealth: Payer: Self-pay | Admitting: *Deleted

## 2012-04-26 NOTE — Telephone Encounter (Signed)
Follow up chemo call, patient currently denies any nausea/vomiting/diarrhea. States he had a wave of nausea last night, took a pill, and hasn't had any trouble since. Instructed patient to call with any future problems or questions.

## 2012-04-26 NOTE — Telephone Encounter (Signed)
Message copied by Kathlynn Grate on Fri Apr 26, 2012  3:28 PM ------      Message from: Keturah Barre      Created: Thu Apr 25, 2012  4:49 PM      Regarding: Chemotherapy follow up      Contact: [phone]       Pt received first time Abraxane and Carboplatin on 04/25/12. MKM ------

## 2012-05-02 ENCOUNTER — Telehealth: Payer: Self-pay | Admitting: Internal Medicine

## 2012-05-02 ENCOUNTER — Encounter: Payer: Self-pay | Admitting: Physician Assistant

## 2012-05-02 ENCOUNTER — Ambulatory Visit (HOSPITAL_BASED_OUTPATIENT_CLINIC_OR_DEPARTMENT_OTHER): Payer: Medicare Other

## 2012-05-02 ENCOUNTER — Telehealth: Payer: Self-pay | Admitting: *Deleted

## 2012-05-02 ENCOUNTER — Other Ambulatory Visit (HOSPITAL_BASED_OUTPATIENT_CLINIC_OR_DEPARTMENT_OTHER): Payer: Medicare Other | Admitting: Lab

## 2012-05-02 ENCOUNTER — Ambulatory Visit (HOSPITAL_BASED_OUTPATIENT_CLINIC_OR_DEPARTMENT_OTHER): Payer: Medicare Other | Admitting: Physician Assistant

## 2012-05-02 DIAGNOSIS — Z5111 Encounter for antineoplastic chemotherapy: Secondary | ICD-10-CM

## 2012-05-02 DIAGNOSIS — C3410 Malignant neoplasm of upper lobe, unspecified bronchus or lung: Secondary | ICD-10-CM

## 2012-05-02 DIAGNOSIS — C778 Secondary and unspecified malignant neoplasm of lymph nodes of multiple regions: Secondary | ICD-10-CM

## 2012-05-02 LAB — CBC WITH DIFFERENTIAL/PLATELET
EOS%: 9.6 % — ABNORMAL HIGH (ref 0.0–7.0)
Eosinophils Absolute: 0.3 10*3/uL (ref 0.0–0.5)
LYMPH%: 42.7 % (ref 14.0–49.0)
MCH: 28.7 pg (ref 27.2–33.4)
MCV: 86.6 fL (ref 79.3–98.0)
MONO%: 3.7 % (ref 0.0–14.0)
NEUT#: 1.4 10*3/uL — ABNORMAL LOW (ref 1.5–6.5)
Platelets: 258 10*3/uL (ref 140–400)
RBC: 4.18 10*6/uL — ABNORMAL LOW (ref 4.20–5.82)
nRBC: 0 % (ref 0–0)

## 2012-05-02 LAB — COMPREHENSIVE METABOLIC PANEL (CC13)
ALT: 12 U/L (ref 0–55)
CO2: 22 mEq/L (ref 22–29)
Creatinine: 0.8 mg/dL (ref 0.7–1.3)
Glucose: 94 mg/dl (ref 70–99)
Total Bilirubin: 0.38 mg/dL (ref 0.20–1.20)

## 2012-05-02 MED ORDER — DEXAMETHASONE SODIUM PHOSPHATE 10 MG/ML IJ SOLN
10.0000 mg | Freq: Once | INTRAMUSCULAR | Status: AC
  Administered 2012-05-02: 10 mg via INTRAVENOUS

## 2012-05-02 MED ORDER — ONDANSETRON 8 MG/50ML IVPB (CHCC)
8.0000 mg | Freq: Once | INTRAVENOUS | Status: AC
  Administered 2012-05-02: 8 mg via INTRAVENOUS

## 2012-05-02 MED ORDER — PACLITAXEL PROTEIN-BOUND CHEMO INJECTION 100 MG
100.0000 mg/m2 | Freq: Once | INTRAVENOUS | Status: AC
  Administered 2012-05-02: 175 mg via INTRAVENOUS
  Filled 2012-05-02: qty 35

## 2012-05-02 MED ORDER — SODIUM CHLORIDE 0.9 % IV SOLN
Freq: Once | INTRAVENOUS | Status: AC
  Administered 2012-05-02: 15:00:00 via INTRAVENOUS

## 2012-05-02 NOTE — Patient Instructions (Addendum)
Continue weekly labs and chemotherapy as scheduled Followup with Dr. [name] in 2 weeks prior to the start of the next scheduled cycle of chemotherapy

## 2012-05-02 NOTE — Telephone Encounter (Signed)
Per staff message and POF I have scheduled appts.  JMW  

## 2012-05-02 NOTE — Progress Notes (Signed)
Ok to treat with ANC 1.4 per Volanda Napoleon

## 2012-05-02 NOTE — Patient Instructions (Signed)
[facility] Discharge Instructions for Patients Receiving Chemotherapy  Today you received the following chemotherapy agents Abraxane To help prevent nausea and vomiting after your treatment, we encourage you to take your nausea medication as prescribed. If you develop nausea and vomiting that is not controlled by your nausea medication, call the clinic. If it is after clinic hours your family physician or the after hours number for the clinic or go to the Emergency Department.   BELOW ARE SYMPTOMS THAT SHOULD BE REPORTED IMMEDIATELY:  *FEVER GREATER THAN 100.5 F  *CHILLS WITH OR WITHOUT FEVER  NAUSEA AND VOMITING THAT IS NOT CONTROLLED WITH YOUR NAUSEA MEDICATION  *UNUSUAL SHORTNESS OF BREATH  *UNUSUAL BRUISING OR BLEEDING  TENDERNESS IN MOUTH AND THROAT WITH OR WITHOUT PRESENCE OF ULCERS  *URINARY PROBLEMS  *BOWEL PROBLEMS  UNUSUAL RASH Items with * indicate a potential emergency and should be followed up as soon as possible.  One of the nurses will contact you 24 hours after your treatment. Please let the nurse know about any problems that you may have experienced. Feel free to call the clinic you have any questions or concerns. The clinic phone number is [phone].   I have been informed and understand all the instructions given to me. I know to contact the clinic, my physician, or go to the Emergency Department if any problems should occur. I do not have any questions at this time, but understand that I may call the clinic during office hours   should I have any questions or need assistance in obtaining follow up care.    __________________________________________  _____________  __________ Signature of Patient or Authorized Representative            Date                   Time    __________________________________________ Nurse's Signature    

## 2012-05-06 ENCOUNTER — Other Ambulatory Visit: Payer: Self-pay | Admitting: Oncology

## 2012-05-06 ENCOUNTER — Telehealth: Payer: Self-pay | Admitting: *Deleted

## 2012-05-06 NOTE — Telephone Encounter (Signed)
Patient called reporting he coughed at 4:00 am and again a little later.  Estimates about 1/4 c volume of light red sputum with blood.  No further blood noted this morning.  Reports he's decided to stop his aspirin and plavix for a few days.  Reports it's been a long time since this has happened.   Also reports diarrhea x 2 days that is resolving.  Asked if imodium is okay to use.  Informed him he may use imodium.  He is eating and drinking well.  Will notify providers.

## 2012-05-07 NOTE — Progress Notes (Signed)
No images are attached to the encounter. No scans are attached to the encounter. No scans are attached to the encounter. [facility] OFFICE PROGRESS NOTE  [name], MD [address] [place] [address]  DIAGNOSIS: Metastatic non-small cell lung cancer, squamous cell carcinoma  PRIOR THERAPY: None  CURRENT THERAPY: Systemic chemotherapy with carboplatin for an AUC of 5 given on day 1 and Abraxane at 100 mg per meter squared given on days 1, 8 and 15 every 3 weeks. Status post day 1 of cycle 1  INTERVAL HISTORY: JERREN FLINCHBAUGH 62 y.o. male returns for a scheduled regular symptom management visit for followup of his metastatic non-small cell lung cancer, squamous cell carcinoma. Patient has had day 1 of cycle 1 of his systemic chemotherapy with carboplatin and Abraxane. He presents today for a symptom management visit and states that overall he tolerated his chemotherapy relatively well with the exception of some nausea, vomiting and diarrhea that started on Saturday after being treated on Thursday. These symptoms have resolved. He voiced no other specific complaints today. Denied fever, chills, shortness breath or chest pain. Denied cough or hemoptysis.   MEDICAL HISTORY: Past Medical History  Diagnosis Date  . BENIGN PROSTATIC HYPERTROPHY 12/24/2009  . CEREBROVASCULAR ACCIDENT, HX OF 12/24/2009  . HYPERLIPIDEMIA 12/24/2009  . HYPERTENSION 12/24/2009  . Testosterone deficiency   . ED (erectile dysfunction)   . Claudication in peripheral vascular disease, lifestyle limiting, with abnormal arterial dopplers of lower ext. 06/30/2011  . S/P angioplasty with stent to Lt. SFA 06/30/11 06/30/2011  . COPD 12/24/2009  . Shortness of breath     "from the COPD" (10/13/2011)  . Stroke 1996    "right side not as strong as left since; numbness R leg/foot" (10/13/2011)  . Pernicious anemia     ALLERGIES:  has No Known Allergies.  MEDICATIONS:  Current Outpatient  Prescriptions  Medication Sig Dispense Refill  . acetaminophen (TYLENOL) 500 MG tablet Take 1,000 mg by mouth every 6 (six) hours as needed. For pain      . aspirin 81 MG tablet Take 81 mg by mouth daily.      Marland Kitchen atorvastatin (LIPITOR) 20 MG tablet Take 1 tablet (20 mg total) by mouth daily.  90 tablet  3  . clopidogrel (PLAVIX) 75 MG tablet Take 75 mg by mouth daily.      Marland Kitchen gabapentin (NEURONTIN) 600 MG tablet TAKE 1 TABLET DAILY  90 tablet  3  . lisinopril (PRINIVIL,ZESTRIL) 20 MG tablet TAKE 1 TABLET DAILY  90 tablet  3  . nortriptyline (PAMELOR) 75 MG capsule TAKE 1 CAPSULE DAILY  90 capsule  3  . prochlorperazine (COMPAZINE) 10 MG tablet Take 1 tablet (10 mg total) by mouth every 6 (six) hours as needed.  60 tablet  0  . tamsulosin (FLOMAX) 0.4 MG CAPS TAKE 1 CAPSULE DAILY  90 capsule  3   No current facility-administered medications for this visit.    SURGICAL HISTORY:  Past Surgical History  Procedure Laterality Date  . Cervical fusion  1980's    "total of 3-4 OR's on my neck; pinched nerve" (10/13/2011)  . Aneurysm coiling  2007  . Peripheral arterial stent graft  06/30/2010; 10/13/10    left; left  . Video bronchoscopy Bilateral 04/03/2012    Procedure: VIDEO BRONCHOSCOPY WITHOUT FLUORO;  Surgeon: Nyoka Cowden, MD;  Location: [facility];  Service: Cardiopulmonary;  Laterality: Bilateral;    REVIEW OF SYSTEMS:  A comprehensive review of  systems was negative except for: Gastrointestinal: positive for diarrhea, nausea and vomiting   PHYSICAL EXAMINATION: General appearance: alert, cooperative, appears stated age and no distress Head: Normocephalic, without obvious abnormality, atraumatic Neck: no adenopathy, no carotid bruit, no JVD, supple, symmetrical, trachea midline and thyroid not enlarged, symmetric, no tenderness/mass/nodules Lymph nodes: Cervical, supraclavicular, and axillary nodes normal. Resp: clear to auscultation bilaterally Cardio: regular rate and rhythm, S1, S2  normal, no murmur, click, rub or gallop GI: soft, non-tender; bowel sounds normal; no masses,  no organomegaly Extremities: extremities normal, atraumatic, no cyanosis or edema Neurologic: Alert and oriented X 3, normal strength and tone. Normal symmetric reflexes. Normal coordination and gait  ECOG PERFORMANCE STATUS: 1 - Symptomatic but completely ambulatory  There were no vitals taken for this visit.  LABORATORY DATA: Lab Results  Component Value Date   WBC 3.2* 05/02/2012   HGB 12.0* 05/02/2012   HCT 36.2* 05/02/2012   MCV 86.6 05/02/2012   PLT 258 05/02/2012      Chemistry      Component Value Date/Time   NA 133* 05/02/2012 1317   NA 133* 10/14/2011 0725   K 4.4 05/02/2012 1317   K 4.1 10/14/2011 0725   CL 102 05/02/2012 1317   CL 99 10/14/2011 0725   CO2 22 05/02/2012 1317   CO2 24 10/14/2011 0725   BUN 9.3 05/02/2012 1317   BUN 8 10/14/2011 0725   CREATININE 0.8 05/02/2012 1317   CREATININE 0.78 10/14/2011 0725      Component Value Date/Time   CALCIUM 9.6 05/02/2012 1317   CALCIUM 9.2 10/14/2011 0725   ALKPHOS 95 05/02/2012 1317   ALKPHOS 79 10/19/2010 0812   AST 14 05/02/2012 1317   AST 10 10/19/2010 0812   ALT 12 05/02/2012 1317   ALT 8 10/19/2010 0812   BILITOT 0.38 05/02/2012 1317   BILITOT 0.3 10/19/2010 0812       RADIOGRAPHIC STUDIES:  Ct Head W Wo Contrast  2012/05/16  *RADIOLOGY REPORT*  Clinical Data: New diagnosis lung cancer.  Staging.  CT HEAD WITHOUT AND WITH CONTRAST  Technique:  Contiguous axial images were obtained from the base of the skull through the vertex without and with intravenous contrast.  Contrast: OMNIPAQUE IOHEXOL 300 MG/ML  SOLN  Comparison: 02/16/2005  Findings: The brain shows mild generalized atrophy.  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  No complication discernible relative to that.  There is no evidence of focal brain infarction, mass lesion, hemorrhage,  hydrocephalus or extra-axial collection. No abnormal contrast enhancement occurs.  There is atherosclerotic calcification of the major vessels at the base of the brain.  No skull or skull base metastasis identified.  IMPRESSION: No evidence of metastatic disease.  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   Original Report Authenticated By: Paulina Fusi, M.D.    Nm Pet Image Initial (pi) Skull Base To Thigh  04/10/2012  *RADIOLOGY REPORT*  Clinical Data: Initial treatment strategy for lung cancer.  NUCLEAR MEDICINE PET SKULL BASE TO THIGH  Fasting Blood Glucose:  87  Technique:  19.3 mCi F-18 FDG was injected intravenously.   CT data was obtained and used for attenuation correction and anatomic localization only.  (This was not acquired as a diagnostic CT examination.) Additional exam technical data entered  on technologist worksheet.  Comparison: CT 03/25/2012  Findings:  Neck: No hypermetabolic nodes in the neck.  Chest: Mass-like soft tissue thickening in the left  suprahilar region which is intensely hypermetabolic with SUV max =  25.7. This lesion is poorly defined on this noncontrast exam but measures 7.7 x 4.5 cm on comparison contrast CT scan.  There is a small hypermetabolic left upper lobe pulmonary nodule measuring 8 mm (image 61) with SUV max = 2.5.  Hypermetabolic adenopathy extends into the prevascular space on the left with intense hypermetabolic node on image 57 measuring 7 mm on image 59.  There is hypermetabolic left axillary lymph node measuring 9 mm (image 61) with SUV max = 4.3.  Abdomen / Pelvis:There is hypermetabolic periportal lymph node measuring 9 mm (image 139) with SUV max = 5.0.  No additional hypermetabolic abdominal pelvic lymph nodes.  Skeleton:No focal hypermetabolic activity to suggest skeletal metastasis.  IMPRESSION:  1.  Hypermetabolic left suprahilar mass consistent with bronchogenic carcinoma. Consider small cell carcinoma. 2. Small hypermetabolic left  upper lobe pulmonary nodule could represent primary. 3.  Hypermetabolic prevascular lymph node on the left as well as hypermetabolic left axillary lymph node consistent with metastasis. 4.  Evidence for  metastasis within the small periportal lymph node.   Original Report Authenticated By: Genevive Bi, M.D.      ASSESSMENT/PLAN: Patient is a very pleasant 62 year-old white male recently diagnosed with metastatic non-small cell lung cancer, squamous cell carcinoma with large left suprahilar mass in addition to mediastinal as well as axillary and abdominal lymphadenopathy. He is currently being treated with systemic chemotherapy in the form of carboplatin for an AUC of 5 given on day 1 and Abraxane at 100 mg per meter squared given on days 1, 8 and 15 every 3 weeks. He is status post day 1 of cycle 1. Patient was discussed with Dr. [name]. He'll proceed with day 8 of cycle one today as scheduled. His ANC 1.4 was reviewed with Dr. [name] and the decision was made for him to proceed with day 8 of cycle 1 as scheduled. He will followup with Dr. [name] in 2 weeks prior to the start of cycle #2 with a repeat CBC differential and C. met.     Laural Benes, Janashia Parco E, PA-C     All questions were answered. The patient knows to call the clinic with any problems, questions or concerns. We can certainly see the patient much sooner if necessary.  I spent 20 minutes counseling the patient face to face. The total time spent in the appointment was 30 minutes.

## 2012-05-09 ENCOUNTER — Telehealth: Payer: Self-pay | Admitting: Internal Medicine

## 2012-05-09 ENCOUNTER — Ambulatory Visit (HOSPITAL_BASED_OUTPATIENT_CLINIC_OR_DEPARTMENT_OTHER): Payer: Medicare Other

## 2012-05-09 ENCOUNTER — Other Ambulatory Visit: Payer: Self-pay | Admitting: Physician Assistant

## 2012-05-09 ENCOUNTER — Other Ambulatory Visit (HOSPITAL_BASED_OUTPATIENT_CLINIC_OR_DEPARTMENT_OTHER): Payer: Medicare Other | Admitting: Lab

## 2012-05-09 DIAGNOSIS — C3410 Malignant neoplasm of upper lobe, unspecified bronchus or lung: Secondary | ICD-10-CM

## 2012-05-09 DIAGNOSIS — D701 Agranulocytosis secondary to cancer chemotherapy: Secondary | ICD-10-CM

## 2012-05-09 DIAGNOSIS — C778 Secondary and unspecified malignant neoplasm of lymph nodes of multiple regions: Secondary | ICD-10-CM

## 2012-05-09 LAB — COMPREHENSIVE METABOLIC PANEL (CC13)
ALT: 25 U/L (ref 0–55)
BUN: 5.9 mg/dL — ABNORMAL LOW (ref 7.0–26.0)
CO2: 24 mEq/L (ref 22–29)
Calcium: 8.6 mg/dL (ref 8.4–10.4)
Chloride: 99 mEq/L (ref 98–107)
Creatinine: 0.7 mg/dL (ref 0.7–1.3)

## 2012-05-09 LAB — CBC WITH DIFFERENTIAL/PLATELET
Basophils Absolute: 0.1 10*3/uL (ref 0.0–0.1)
HCT: 32.4 % — ABNORMAL LOW (ref 38.4–49.9)
HGB: 11 g/dL — ABNORMAL LOW (ref 13.0–17.1)
MONO#: 0.4 10*3/uL (ref 0.1–0.9)
NEUT%: 14.8 % — ABNORMAL LOW (ref 39.0–75.0)
WBC: 2.3 10*3/uL — ABNORMAL LOW (ref 4.0–10.3)
lymph#: 1.4 10*3/uL (ref 0.9–3.3)

## 2012-05-09 MED ORDER — FILGRASTIM 300 MCG/0.5ML IJ SOLN
300.0000 ug | Freq: Once | INTRAMUSCULAR | Status: AC
  Administered 2012-05-09: 300 ug via SUBCUTANEOUS
  Filled 2012-05-09: qty 0.5

## 2012-05-09 NOTE — Progress Notes (Signed)
Hold treatment today, per Tiana Loft, PA; Neut = 0.3; patient to receive Neupogen 300 mcg today (05/09/12), 05/10/12 and 05/11/12, per Dr. [name]; patient d/ced to scheduling to receive appointment times for remaining injections; verbalized understanding.

## 2012-05-09 NOTE — Telephone Encounter (Signed)
gv and printed appt sched and avs for pt  °

## 2012-05-10 ENCOUNTER — Ambulatory Visit (HOSPITAL_BASED_OUTPATIENT_CLINIC_OR_DEPARTMENT_OTHER): Payer: Medicare Other

## 2012-05-10 DIAGNOSIS — C3410 Malignant neoplasm of upper lobe, unspecified bronchus or lung: Secondary | ICD-10-CM

## 2012-05-10 MED ORDER — FILGRASTIM 300 MCG/0.5ML IJ SOLN
300.0000 ug | Freq: Once | INTRAMUSCULAR | Status: AC
  Administered 2012-05-10: 300 ug via SUBCUTANEOUS
  Filled 2012-05-10: qty 0.5

## 2012-05-11 ENCOUNTER — Ambulatory Visit (HOSPITAL_BASED_OUTPATIENT_CLINIC_OR_DEPARTMENT_OTHER): Payer: Medicare Other

## 2012-05-11 DIAGNOSIS — Z5189 Encounter for other specified aftercare: Secondary | ICD-10-CM

## 2012-05-11 DIAGNOSIS — C3410 Malignant neoplasm of upper lobe, unspecified bronchus or lung: Secondary | ICD-10-CM

## 2012-05-11 MED ORDER — FILGRASTIM 300 MCG/0.5ML IJ SOLN
300.0000 ug | Freq: Once | INTRAMUSCULAR | Status: AC
  Administered 2012-05-11: 300 ug via SUBCUTANEOUS

## 2012-05-16 ENCOUNTER — Ambulatory Visit (HOSPITAL_BASED_OUTPATIENT_CLINIC_OR_DEPARTMENT_OTHER): Payer: Medicare Other

## 2012-05-16 ENCOUNTER — Other Ambulatory Visit: Payer: Medicare Other | Admitting: Lab

## 2012-05-16 ENCOUNTER — Encounter: Payer: Self-pay | Admitting: Internal Medicine

## 2012-05-16 ENCOUNTER — Other Ambulatory Visit (HOSPITAL_BASED_OUTPATIENT_CLINIC_OR_DEPARTMENT_OTHER): Payer: Medicare Other | Admitting: Lab

## 2012-05-16 ENCOUNTER — Ambulatory Visit (HOSPITAL_BASED_OUTPATIENT_CLINIC_OR_DEPARTMENT_OTHER): Payer: Medicare Other | Admitting: Internal Medicine

## 2012-05-16 ENCOUNTER — Telehealth: Payer: Self-pay | Admitting: Internal Medicine

## 2012-05-16 DIAGNOSIS — C778 Secondary and unspecified malignant neoplasm of lymph nodes of multiple regions: Secondary | ICD-10-CM

## 2012-05-16 DIAGNOSIS — C3410 Malignant neoplasm of upper lobe, unspecified bronchus or lung: Secondary | ICD-10-CM

## 2012-05-16 DIAGNOSIS — C3490 Malignant neoplasm of unspecified part of unspecified bronchus or lung: Secondary | ICD-10-CM

## 2012-05-16 DIAGNOSIS — Z5111 Encounter for antineoplastic chemotherapy: Secondary | ICD-10-CM

## 2012-05-16 LAB — CBC WITH DIFFERENTIAL/PLATELET
BASO%: 0.7 % (ref 0.0–2.0)
HCT: 32.3 % — ABNORMAL LOW (ref 38.4–49.9)
HGB: 10.7 g/dL — ABNORMAL LOW (ref 13.0–17.1)
MCHC: 33.1 g/dL (ref 32.0–36.0)
MONO#: 1.2 10*3/uL — ABNORMAL HIGH (ref 0.1–0.9)
NEUT%: 56.1 % (ref 39.0–75.0)
RDW: 14.4 % (ref 11.0–14.6)
WBC: 6.8 10*3/uL (ref 4.0–10.3)
lymph#: 1.7 10*3/uL (ref 0.9–3.3)

## 2012-05-16 LAB — COMPREHENSIVE METABOLIC PANEL (CC13)
AST: 14 U/L (ref 5–34)
Albumin: 2.5 g/dL — ABNORMAL LOW (ref 3.5–5.0)
Alkaline Phosphatase: 123 U/L (ref 40–150)
BUN: 5.6 mg/dL — ABNORMAL LOW (ref 7.0–26.0)
Calcium: 8.9 mg/dL (ref 8.4–10.4)
Creatinine: 0.7 mg/dL (ref 0.7–1.3)
Glucose: 122 mg/dl — ABNORMAL HIGH (ref 70–99)
Potassium: 3.9 mEq/L (ref 3.5–5.1)

## 2012-05-16 MED ORDER — SODIUM CHLORIDE 0.9 % IV SOLN
642.5000 mg | Freq: Once | INTRAVENOUS | Status: AC
  Administered 2012-05-16: 640 mg via INTRAVENOUS
  Filled 2012-05-16: qty 64

## 2012-05-16 MED ORDER — DEXAMETHASONE SODIUM PHOSPHATE 20 MG/5ML IJ SOLN
20.0000 mg | Freq: Once | INTRAMUSCULAR | Status: AC
  Administered 2012-05-16: 20 mg via INTRAVENOUS
  Filled 2012-05-16: qty 5

## 2012-05-16 MED ORDER — ONDANSETRON 16 MG/50ML IVPB (CHCC)
16.0000 mg | Freq: Once | INTRAVENOUS | Status: AC
  Administered 2012-05-16: 16 mg via INTRAVENOUS

## 2012-05-16 MED ORDER — SODIUM CHLORIDE 0.9 % IV SOLN
Freq: Once | INTRAVENOUS | Status: AC
  Administered 2012-05-16: 10:00:00 via INTRAVENOUS

## 2012-05-16 MED ORDER — PACLITAXEL PROTEIN-BOUND CHEMO INJECTION 100 MG
100.0000 mg/m2 | Freq: Once | INTRAVENOUS | Status: AC
  Administered 2012-05-16: 175 mg via INTRAVENOUS
  Filled 2012-05-16: qty 35

## 2012-05-16 NOTE — Patient Instructions (Addendum)
[facility] Discharge Instructions for Patients Receiving Chemotherapy  Today you received the following chemotherapy agents Abraxane, Carbo  To help prevent nausea and vomiting after your treatment, we encourage you to take your nausea medication as prescribed. Begin taking it as directed and take it as often as prescribed for the next 48-72 hours.   If you develop nausea and vomiting that is not controlled by your nausea medication, call the clinic. If it is after clinic hours your family physician or the after hours number for the clinic or go to the Emergency Department.   BELOW ARE SYMPTOMS THAT SHOULD BE REPORTED IMMEDIATELY:  *FEVER GREATER THAN 100.5 F  *CHILLS WITH OR WITHOUT FEVER  NAUSEA AND VOMITING THAT IS NOT CONTROLLED WITH YOUR NAUSEA MEDICATION  *UNUSUAL SHORTNESS OF BREATH  *UNUSUAL BRUISING OR BLEEDING  TENDERNESS IN MOUTH AND THROAT WITH OR WITHOUT PRESENCE OF ULCERS  *URINARY PROBLEMS  *BOWEL PROBLEMS  UNUSUAL RASH Items with * indicate a potential emergency and should be followed up as soon as possible.  One of the nurses will contact you 24 hours after your treatment. Please let the nurse know about any problems that you may have experienced. Feel free to call the clinic you have any questions or concerns. The clinic phone number is [phone].   I have been informed and understand all the instructions given to me. I know to contact the clinic, my physician, or go to the Emergency Department if any problems should occur. I do not have any questions at this time, but understand that I may call the clinic during office hours   should I have any questions or need assistance in obtaining follow up care.    __________________________________________  _____________  __________ Signature of Patient or Authorized Representative            Date                   Time    __________________________________________ Nurse's Signature

## 2012-05-16 NOTE — Progress Notes (Signed)
[facility] [phone]   [phone]  OFFICE PROGRESS NOTE  Frank Boga, MD [address] [place] [address]  DIAGNOSIS: Metastatic non-small cell lung cancer, squamous cell carcinoma   PRIOR THERAPY: None   CURRENT THERAPY: Systemic chemotherapy with carboplatin for an AUC of 5 given on day 1 and Abraxane at 100 mg per meter squared given on days 1, 8 and 15 every 3 weeks. Status post day 1 of cycle 1  INTERVAL HISTORY: [name] 62 y.o. male returns to the clinic today for followup visit accompanied by his wife and the patient tolerated the last cycle of his systemic chemotherapy fairly well except for mild occasional nausea. He denied having any significant chest pain, shortness breath, cough or hemoptysis. He missed day 15 secondary to neutropenia. The patient denied having any significant weight loss or night sweats. He is here today to start cycle #2 of his chemotherapy.  MEDICAL HISTORY: Past Medical History  Diagnosis Date  . BENIGN PROSTATIC HYPERTROPHY 12/24/2009  . CEREBROVASCULAR ACCIDENT, HX OF 12/24/2009  . HYPERLIPIDEMIA 12/24/2009  . HYPERTENSION 12/24/2009  . Testosterone deficiency   . ED (erectile dysfunction)   . Claudication in peripheral vascular disease, lifestyle limiting, with abnormal arterial dopplers of lower ext. 06/30/2011  . S/P angioplasty with stent to Lt. SFA 06/30/11 06/30/2011  . COPD 12/24/2009  . Shortness of breath     "from the COPD" (10/13/2011)  . Stroke 1996    "right side not as strong as left since; numbness R leg/foot" (10/13/2011)  . Pernicious anemia     ALLERGIES:  has No Known Allergies.  MEDICATIONS:  Current Outpatient Prescriptions  Medication Sig Dispense Refill  . aspirin 81 MG tablet Take 81 mg by mouth daily.      Marland Kitchen atorvastatin (LIPITOR) 20 MG tablet Take 1 tablet (20 mg total) by mouth daily.  90 tablet  3  . clopidogrel (PLAVIX) 75 MG tablet Take 75 mg by  mouth daily.      Marland Kitchen gabapentin (NEURONTIN) 600 MG tablet TAKE 1 TABLET DAILY  90 tablet  3  . lisinopril (PRINIVIL,ZESTRIL) 20 MG tablet TAKE 1 TABLET DAILY  90 tablet  3  . nortriptyline (PAMELOR) 75 MG capsule TAKE 1 CAPSULE DAILY  90 capsule  3  . tamsulosin (FLOMAX) 0.4 MG CAPS TAKE 1 CAPSULE DAILY  90 capsule  3  . acetaminophen (TYLENOL) 500 MG tablet Take 1,000 mg by mouth every 6 (six) hours as needed. For pain      . prochlorperazine (COMPAZINE) 10 MG tablet Take 1 tablet (10 mg total) by mouth every 6 (six) hours as needed.  60 tablet  0   No current facility-administered medications for this visit.    SURGICAL HISTORY:  Past Surgical History  Procedure Laterality Date  . Cervical fusion  1980's    "total of 3-4 OR's on my neck; pinched nerve" (10/13/2011)  . Aneurysm coiling  2007  . Peripheral arterial stent graft  06/30/2010; 10/13/10    left; left  . Video bronchoscopy Bilateral 04/03/2012    Procedure: VIDEO BRONCHOSCOPY WITHOUT FLUORO;  Surgeon: Nyoka Cowden, MD;  Location: [facility];  Service: Cardiopulmonary;  Laterality: Bilateral;    REVIEW OF SYSTEMS:  A comprehensive review of systems was negative.   PHYSICAL EXAMINATION: General appearance: alert, cooperative and no distress Head: Normocephalic, without obvious abnormality, atraumatic Neck: no adenopathy Lymph nodes: Cervical, supraclavicular, and axillary nodes normal. Resp: clear  to auscultation bilaterally Cardio: regular rate and rhythm, S1, S2 normal, no murmur, click, rub or gallop GI: soft, non-tender; bowel sounds normal; no masses,  no organomegaly Extremities: extremities normal, atraumatic, no cyanosis or edema Neurologic: Alert and oriented X 3, normal strength and tone. Normal symmetric reflexes. Normal coordination and gait  ECOG PERFORMANCE STATUS: 1 - Symptomatic but completely ambulatory  Blood pressure 115/67, pulse 103, temperature 97.4 F (36.3 C), temperature source Oral, resp. rate  20, height 5\' 7"  (1.702 m), weight 145 lb 8 oz (65.998 kg).  LABORATORY DATA: Lab Results  Component Value Date   WBC 6.8 05/16/2012   HGB 10.7* 05/16/2012   HCT 32.3* 05/16/2012   MCV 86.1 05/16/2012   PLT 225 05/16/2012      Chemistry      Component Value Date/Time   NA 129* 05/09/2012 1348   NA 133* 10/14/2011 0725   K 3.8 05/09/2012 1348   K 4.1 10/14/2011 0725   CL 99 05/09/2012 1348   CL 99 10/14/2011 0725   CO2 24 05/09/2012 1348   CO2 24 10/14/2011 0725   BUN 5.9* 05/09/2012 1348   BUN 8 10/14/2011 0725   CREATININE 0.7 05/09/2012 1348   CREATININE 0.78 10/14/2011 0725      Component Value Date/Time   CALCIUM 8.6 05/09/2012 1348   CALCIUM 9.2 10/14/2011 0725   ALKPHOS 127 05/09/2012 1348   ALKPHOS 79 10/19/2010 0812   AST 16 05/09/2012 1348   AST 10 10/19/2010 0812   ALT 25 05/09/2012 1348   ALT 8 10/19/2010 0812   BILITOT 0.33 05/09/2012 1348   BILITOT 0.3 10/19/2010 0812       RADIOGRAPHIC STUDIES: Ct Head W Wo Contrast  04/17/2012  *RADIOLOGY REPORT*  Clinical Data: New diagnosis lung cancer.  Staging.  CT HEAD WITHOUT AND WITH CONTRAST  Technique:  Contiguous axial images were obtained from the base of the skull through the vertex without and with intravenous contrast.  Contrast: OMNIPAQUE IOHEXOL 300 MG/ML  SOLN  Comparison: 02/16/2005  Findings: The brain shows mild generalized atrophy.  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  No complication discernible relative to that.  There is no evidence of focal brain infarction, mass lesion, hemorrhage, hydrocephalus or extra-axial collection. No abnormal contrast enhancement occurs.  There is atherosclerotic calcification of the major vessels at the base of the brain.  No skull or skull base metastasis identified.  IMPRESSION: No evidence of metastatic disease.  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   Original Report Authenticated By:  Paulina Fusi, M.D.     ASSESSMENT: This is a very pleasant 62 years old white male with metastatic non-small cell lung cancer, squamous cell carcinoma diagnosed in April 2014, status post 1 cycle of systemic chemotherapy with carboplatin and Abraxane   PLAN: The patient is doing fine today and he is tolerating his treatment fairly well. I discussed the lab result with the patient today. I recommended for him to continue with the second cycle of his chemotherapy today as scheduled. He will come back for followup visit in 3 weeks with the next cycle of his chemotherapy. The patient will check with his cardiologist regarding the need to continue on aspirin and Plavix.  All questions were answered. The patient knows to call the clinic with any problems, questions or concerns. We can certainly see the patient much sooner if necessary.  I spent 15 minutes counseling the  patient face to face. The total time spent in the appointment was 25 minutes.

## 2012-05-16 NOTE — Patient Instructions (Signed)
Continue chemotherapy today as scheduled.  Followup in 3 weeks

## 2012-05-23 ENCOUNTER — Ambulatory Visit (HOSPITAL_BASED_OUTPATIENT_CLINIC_OR_DEPARTMENT_OTHER): Payer: Medicare Other

## 2012-05-23 ENCOUNTER — Other Ambulatory Visit (HOSPITAL_BASED_OUTPATIENT_CLINIC_OR_DEPARTMENT_OTHER): Payer: Medicare Other | Admitting: Lab

## 2012-05-23 DIAGNOSIS — Z5111 Encounter for antineoplastic chemotherapy: Secondary | ICD-10-CM

## 2012-05-23 DIAGNOSIS — C3410 Malignant neoplasm of upper lobe, unspecified bronchus or lung: Secondary | ICD-10-CM

## 2012-05-23 DIAGNOSIS — C778 Secondary and unspecified malignant neoplasm of lymph nodes of multiple regions: Secondary | ICD-10-CM

## 2012-05-23 LAB — CBC WITH DIFFERENTIAL/PLATELET
Basophils Absolute: 0.1 10*3/uL (ref 0.0–0.1)
Eosinophils Absolute: 0.1 10*3/uL (ref 0.0–0.5)
HCT: 33.9 % — ABNORMAL LOW (ref 38.4–49.9)
HGB: 11.3 g/dL — ABNORMAL LOW (ref 13.0–17.1)
MCV: 86.9 fL (ref 79.3–98.0)
NEUT#: 2.9 10*3/uL (ref 1.5–6.5)
NEUT%: 58.3 % (ref 39.0–75.0)
RDW: 14.4 % (ref 11.0–14.6)
lymph#: 1.6 10*3/uL (ref 0.9–3.3)

## 2012-05-23 LAB — COMPREHENSIVE METABOLIC PANEL (CC13)
AST: 15 U/L (ref 5–34)
Albumin: 3.1 g/dL — ABNORMAL LOW (ref 3.5–5.0)
Alkaline Phosphatase: 112 U/L (ref 40–150)
BUN: 11.4 mg/dL (ref 7.0–26.0)
Creatinine: 0.8 mg/dL (ref 0.7–1.3)
Glucose: 122 mg/dl — ABNORMAL HIGH (ref 70–99)
Total Bilirubin: 0.28 mg/dL (ref 0.20–1.20)

## 2012-05-23 MED ORDER — ONDANSETRON 8 MG/50ML IVPB (CHCC)
8.0000 mg | Freq: Once | INTRAVENOUS | Status: AC
  Administered 2012-05-23: 8 mg via INTRAVENOUS

## 2012-05-23 MED ORDER — DEXAMETHASONE SODIUM PHOSPHATE 10 MG/ML IJ SOLN
10.0000 mg | Freq: Once | INTRAMUSCULAR | Status: AC
  Administered 2012-05-23: 10 mg via INTRAVENOUS

## 2012-05-23 MED ORDER — SODIUM CHLORIDE 0.9 % IV SOLN
Freq: Once | INTRAVENOUS | Status: AC
  Administered 2012-05-23: 10:00:00 via INTRAVENOUS

## 2012-05-23 MED ORDER — PACLITAXEL PROTEIN-BOUND CHEMO INJECTION 100 MG
100.0000 mg/m2 | Freq: Once | INTRAVENOUS | Status: AC
  Administered 2012-05-23: 175 mg via INTRAVENOUS
  Filled 2012-05-23: qty 35

## 2012-05-23 NOTE — Patient Instructions (Addendum)
[facility] Discharge Instructions for Patients Receiving Chemotherapy  Today you received the following chemotherapy agents Abraxane  To help prevent nausea and vomiting after your treatment, we encourage you to take your nausea medication as prescribed. Begin taking it as directed and take it as often as prescribed for the next 48-72 hours.   If you develop nausea and vomiting that is not controlled by your nausea medication, call the clinic. If it is after clinic hours your family physician or the after hours number for the clinic or go to the Emergency Department.   BELOW ARE SYMPTOMS THAT SHOULD BE REPORTED IMMEDIATELY:  *FEVER GREATER THAN 100.5 F  *CHILLS WITH OR WITHOUT FEVER  NAUSEA AND VOMITING THAT IS NOT CONTROLLED WITH YOUR NAUSEA MEDICATION  *UNUSUAL SHORTNESS OF BREATH  *UNUSUAL BRUISING OR BLEEDING  TENDERNESS IN MOUTH AND THROAT WITH OR WITHOUT PRESENCE OF ULCERS  *URINARY PROBLEMS  *BOWEL PROBLEMS  UNUSUAL RASH Items with * indicate a potential emergency and should be followed up as soon as possible.  One of the nurses will contact you 24 hours after your treatment. Please let the nurse know about any problems that you may have experienced. Feel free to call the clinic you have any questions or concerns. The clinic phone number is [phone].   I have been informed and understand all the instructions given to me. I know to contact the clinic, my physician, or go to the Emergency Department if any problems should occur. I do not have any questions at this time, but understand that I may call the clinic during office hours   should I have any questions or need assistance in obtaining follow up care.    __________________________________________  _____________  __________ Signature of Patient or Authorized Representative            Date                   Time    __________________________________________ Nurse's Signature

## 2012-05-24 ENCOUNTER — Other Ambulatory Visit: Payer: Self-pay | Admitting: Internal Medicine

## 2012-05-29 ENCOUNTER — Telehealth: Payer: Self-pay | Admitting: Oncology

## 2012-05-30 ENCOUNTER — Other Ambulatory Visit: Payer: Self-pay | Admitting: Physician Assistant

## 2012-05-30 ENCOUNTER — Other Ambulatory Visit (HOSPITAL_BASED_OUTPATIENT_CLINIC_OR_DEPARTMENT_OTHER): Payer: Medicare Other | Admitting: Lab

## 2012-05-30 ENCOUNTER — Telehealth: Payer: Self-pay | Admitting: Internal Medicine

## 2012-05-30 ENCOUNTER — Ambulatory Visit (HOSPITAL_BASED_OUTPATIENT_CLINIC_OR_DEPARTMENT_OTHER): Payer: Medicare Other

## 2012-05-30 DIAGNOSIS — C3410 Malignant neoplasm of upper lobe, unspecified bronchus or lung: Secondary | ICD-10-CM

## 2012-05-30 DIAGNOSIS — Z5189 Encounter for other specified aftercare: Secondary | ICD-10-CM

## 2012-05-30 LAB — CBC WITH DIFFERENTIAL/PLATELET
Basophils Absolute: 0.1 10*3/uL (ref 0.0–0.1)
Eosinophils Absolute: 0.1 10*3/uL (ref 0.0–0.5)
HCT: 28.2 % — ABNORMAL LOW (ref 38.4–49.9)
HGB: 9.4 g/dL — ABNORMAL LOW (ref 13.0–17.1)
MCV: 86 fL (ref 79.3–98.0)
MONO%: 12.4 % (ref 0.0–14.0)
NEUT#: 0.2 10*3/uL — CL (ref 1.5–6.5)
NEUT%: 10.9 % — ABNORMAL LOW (ref 39.0–75.0)
Platelets: 162 10*3/uL (ref 140–400)
RDW: 14.4 % (ref 11.0–14.6)

## 2012-05-30 MED ORDER — FILGRASTIM 300 MCG/0.5ML IJ SOLN
300.0000 ug | Freq: Once | INTRAMUSCULAR | Status: AC
Start: 2012-05-30 — End: 2012-05-30
  Administered 2012-05-30: 300 ug via SUBCUTANEOUS
  Filled 2012-05-30: qty 0.5

## 2012-05-30 NOTE — Telephone Encounter (Signed)
Added inj appts for 5/16 and 5/17. Pt given schedule for May/June. No new orders.

## 2012-05-30 NOTE — Patient Instructions (Signed)
Neutropenia Neutropenia is a condition that occurs when the level of a certain type of white blood cell (neutrophil) in your body becomes lower than normal. Neutrophils are made in the bone marrow and fight infections. These cells protect against bacteria and viruses. The fewer neutrophils you have, and the longer your body remains without them, the greater your risk of getting a severe infection becomes. CAUSES  The cause of neutropenia may be hard to determine. However, it is usually due to 3 main problems:   Decreased production of neutrophils. This may be due to:  Certain medicines such as chemotherapy.  Genetic problems.  Cancer.  Radiation treatments.  Vitamin deficiency.  Some pesticides.  Increased destruction of neutrophils. This may be due to:  Overwhelming infections.  Hemolytic anemia. This is when the body destroys its own blood cells.  Chemotherapy.  Neutrophils moving to areas of the body where they cannot fight infections. This may be due to:  Dialysis procedures.  Conditions where the spleen becomes enlarged. Neutrophils are held in the spleen and are not available to the rest of the body.  Overwhelming infections. The neutrophils are held in the area of the infection and are not available to the rest of the body. SYMPTOMS  There are no specific symptoms of neutropenia. The lack of neutrophils can result in an infection, and an infection can cause various problems. DIAGNOSIS  Diagnosis is made by a blood test. A complete blood count is performed. The normal level of neutrophils in human blood differs with age and race. Infants have lower counts than older children and adults. African Americans have lower counts than Caucasians or Asians. The average adult level is 1500 cells/mm3 of blood. Neutrophil counts are interpreted as follows:  Greater than 1000 cells/mm3 gives normal protection against infection.  500 to 1000 cells/mm3 gives an increased risk for  infection.  200 to 500 cells/mm3 is a greater risk for severe infection.  Lower than 200 cells/mm3 is a marked risk of infection. This may require hospitalization and treatment with antibiotic medicines. TREATMENT  Treatment depends on the underlying cause, severity, and presence of infections or symptoms. It also depends on your health. Your caregiver will discuss the treatment plan with you. Mild cases are often easily treated and have a good outcome. Preventative measures may also be started to limit your risk of infections. Treatment can include:  Taking antibiotics.  Stopping medicines that are known to cause neutropenia.  Correcting nutritional deficiencies by eating green vegetables to supply folic acid and taking vitamin B supplements.  Stopping exposure to pesticides if your neutropenia is related to pesticide exposure.  Taking a blood growth factor called sargramostim, pegfilgrastim, or filgrastim if you are undergoing chemotherapy for cancer. This stimulates white blood cell production.  Removal of the spleen if you have Felty's syndrome and have repeated infections. HOME CARE INSTRUCTIONS   Follow your caregiver's instructions about when you need to have blood work done.  Wash your hands often. Make sure others who come in contact with you also wash their hands.  Wash raw fruits and vegetables before eating them. They can carry bacteria and fungi.  Avoid people with colds or spreadable (contagious) diseases (chickenpox, herpes zoster, influenza).  Avoid large crowds.  Avoid construction areas. The dust can release fungus into the air.  Be cautious around children in daycare or school environments.  Take care of your respiratory system by coughing and deep breathing.  Bathe daily.  Protect your skin from cuts and   areas. The dust can release fungus into the air.   Be cautious around children in daycare or school environments.   Take care of your respiratory system by coughing and deep breathing.   Bathe daily.   Protect your skin from cuts and burns.   Do not work in the garden or with flowers and plants.   Care for the mouth before and after meals by brushing with a soft toothbrush. If you have  mucositis, do not use mouthwash. Mouthwash contains alcohol and can dry out the mouth even more.   Clean the area between the genitals and the anus (perineal area) after urination and bowel movements. Women need to wipe from front to back.   Use a water soluble lubricant during sexual intercourse and practice good hygiene after. Do not have intercourse if you are severely neutropenic. Check with your caregiver for guidelines.   Exercise daily as tolerated.   Avoid people who were vaccinated with a live vaccine in the past 30 days. You should not receive live vaccines (polio, typhoid).   Do not provide direct care for pets. Avoid animal droppings. Do not clean litter boxes and bird cages.   Do not share food utensils.   Do not use tampons, enemas, or rectal suppositories unless directed by your caregiver.   Use an electric razor to remove hair.   Wash your hands after handling magazines, letters, and newspapers.  SEEK IMMEDIATE MEDICAL CARE IF:    You have a fever.   You have chills or start to shake.   You feel nauseous or vomit.   You develop mouth sores.   You develop aches and pains.   You have redness and swelling around open wounds.   Your skin is warm to the touch.   You have pus coming from your wounds.   You develop swollen lymph nodes.   You feel weak or fatigued.   You develop red streaks on the skin.  MAKE SURE YOU:   Understand these instructions.   Will watch your condition.   Will get help right away if you are not doing well or get worse.  Document Released: 06/24/2001 Document Revised: 03/27/2011 Document Reviewed: 07/22/2010  [employer] Patient Information 2013 [employer].

## 2012-05-31 ENCOUNTER — Ambulatory Visit (HOSPITAL_BASED_OUTPATIENT_CLINIC_OR_DEPARTMENT_OTHER): Payer: Medicare Other

## 2012-05-31 DIAGNOSIS — C3410 Malignant neoplasm of upper lobe, unspecified bronchus or lung: Secondary | ICD-10-CM

## 2012-05-31 DIAGNOSIS — Z5189 Encounter for other specified aftercare: Secondary | ICD-10-CM

## 2012-05-31 MED ORDER — FILGRASTIM 300 MCG/0.5ML IJ SOLN
300.0000 ug | Freq: Once | INTRAMUSCULAR | Status: AC
  Administered 2012-05-31: 300 ug via SUBCUTANEOUS
  Filled 2012-05-31: qty 0.5

## 2012-06-01 ENCOUNTER — Ambulatory Visit (HOSPITAL_BASED_OUTPATIENT_CLINIC_OR_DEPARTMENT_OTHER): Payer: Medicare Other

## 2012-06-01 DIAGNOSIS — Z5189 Encounter for other specified aftercare: Secondary | ICD-10-CM

## 2012-06-01 DIAGNOSIS — C3410 Malignant neoplasm of upper lobe, unspecified bronchus or lung: Secondary | ICD-10-CM

## 2012-06-01 MED ORDER — FILGRASTIM 300 MCG/0.5ML IJ SOLN
300.0000 ug | Freq: Once | INTRAMUSCULAR | Status: AC
  Administered 2012-06-01: 300 ug via SUBCUTANEOUS

## 2012-06-05 ENCOUNTER — Encounter: Payer: Self-pay | Admitting: Internal Medicine

## 2012-06-05 ENCOUNTER — Ambulatory Visit (INDEPENDENT_AMBULATORY_CARE_PROVIDER_SITE_OTHER): Payer: Medicare Other | Admitting: Internal Medicine

## 2012-06-05 VITALS — BP 130/72 | HR 110 | Temp 98.3°F | Resp 20 | Ht 66.25 in | Wt 144.0 lb

## 2012-06-05 DIAGNOSIS — Z8679 Personal history of other diseases of the circulatory system: Secondary | ICD-10-CM

## 2012-06-05 DIAGNOSIS — C3490 Malignant neoplasm of unspecified part of unspecified bronchus or lung: Secondary | ICD-10-CM

## 2012-06-05 DIAGNOSIS — N400 Enlarged prostate without lower urinary tract symptoms: Secondary | ICD-10-CM

## 2012-06-05 DIAGNOSIS — J449 Chronic obstructive pulmonary disease, unspecified: Secondary | ICD-10-CM

## 2012-06-05 DIAGNOSIS — Z0000 Encounter for general adult medical examination without abnormal findings: Secondary | ICD-10-CM

## 2012-06-05 DIAGNOSIS — I10 Essential (primary) hypertension: Secondary | ICD-10-CM

## 2012-06-05 DIAGNOSIS — I739 Peripheral vascular disease, unspecified: Secondary | ICD-10-CM

## 2012-06-05 MED ORDER — BUDESONIDE-FORMOTEROL FUMARATE 160-4.5 MCG/ACT IN AERO: 2.0000 | INHALATION_SPRAY | Freq: Two times a day (BID) | RESPIRATORY_TRACT | Status: DC

## 2012-06-05 MED ORDER — ALBUTEROL SULFATE (2.5 MG/3ML) 0.083% IN NEBU: 2.5000 mg | INHALATION_SOLUTION | Freq: Four times a day (QID) | RESPIRATORY_TRACT | Status: DC | PRN

## 2012-06-05 MED ORDER — ALBUTEROL SULFATE HFA 108 (90 BASE) MCG/ACT IN AERS: 2.0000 | INHALATION_SPRAY | Freq: Four times a day (QID) | RESPIRATORY_TRACT | Status: DC | PRN

## 2012-06-05 NOTE — Progress Notes (Signed)
Subjective:    Patient ID: [name], male    DOB: [birth year], 62 y.o.   MRN: [id number]  HPI  62 year old patient who is seen today for a preventive health examination. He has a history of fairly recently diagnosed squamous cell carcinoma of the left lung, metastatic,  And is followed closely by oncology and is receiving systemic chemotherapy. He has a history of COPD. He does complain of some increasing shortness of breath especially with exertion he is asking about inhalational medications. He continues to smoke but is trying to taper down. Stable medical problems include hypertension dyslipidemia and peripheral vascular disease.  Past History:  Past Medical History:   COPD  Hyperlipidemia (hypertriglyceridemia)  Hypertension  Benign prostatic hypertrophy  Cerebrovascular accident, hx of 1996 with mild residual right-sided symptoms  history of cerebral aneurysm, status post coiling 2007  peripheral neuropathy  testosterone deficiency/ED  pernicious anemia  history of orthostatic hypotension  history of peripheral vascular disease  essential thrombocythemia   Past Surgical History:  cervical fusion  stenting and coiling of a cerebral aneurysm  colonoscopy 2010   Family History:  Reviewed history and no changes required.  father died of stomach cancer  one brother died of a 9. Metastatic cancer, unclear primary   Social History:  Reviewed history and no changes required.  Married  Current Smoker  11 th grade education  disabled since 19  married two sons, one daughter  no alcohol useSmoking Status: current   Wt Readings from Last 3 Encounters:  06/05/12 144 lb (65.318 kg)  05/16/12 145 lb 8 oz (65.998 kg)  04/16/12 147 lb 8 oz (66.906 kg)    Past Medical History  Diagnosis Date  . BENIGN PROSTATIC HYPERTROPHY 12/24/2009  . CEREBROVASCULAR ACCIDENT, HX OF 12/24/2009  . HYPERLIPIDEMIA 12/24/2009  . HYPERTENSION 12/24/2009  . Testosterone deficiency   . ED  (erectile dysfunction)   . Claudication in peripheral vascular disease, lifestyle limiting, with abnormal arterial dopplers of lower ext. 06/30/2011  . S/P angioplasty with stent to Lt. SFA 06/30/11 06/30/2011  . COPD 12/24/2009  . Shortness of breath     "from the COPD" (10/13/2011)  . Stroke 1996    "right side not as strong as left since; numbness R leg/foot" (10/13/2011)  . Pernicious anemia     History   Social History  . Marital Status: Married    Spouse Name: N/A    Number of Children: 3  . Years of Education: N/A   Occupational History  . Retired     Airline pilot   Social History Main Topics  . Smoking status: Current Every Day Smoker -- 1.00 packs/day for 45 years    Types: Cigarettes  . Smokeless tobacco: Never Used  . Alcohol Use: No  . Drug Use: No  . Sexually Active: Not Currently   Other Topics Concern  . Not on file   Social History Narrative  . No narrative on file    Past Surgical History  Procedure Laterality Date  . Cervical fusion  1980's    "total of 3-4 OR's on my neck; pinched nerve" (10/13/2011)  . Aneurysm coiling  2007  . Peripheral arterial stent graft  06/30/2010; 10/13/10    left; left  . Video bronchoscopy Bilateral 04/03/2012    Procedure: VIDEO BRONCHOSCOPY WITHOUT FLUORO;  Surgeon: Nyoka Cowden, MD;  Location: [facility];  Service: Cardiopulmonary;  Laterality: Bilateral;    Family History  Problem Relation Age of Onset  . Emphysema Father  smoked  . Emphysema Paternal Uncle     smoked  . Heart disease Mother   . Lung cancer Father     smoked  . Lung cancer Paternal Grandfather     smoked    No Known Allergies  Current Outpatient Prescriptions on File Prior to Visit  Medication Sig Dispense Refill  . acetaminophen (TYLENOL) 500 MG tablet Take 1,000 mg by mouth every 6 (six) hours as needed. For pain      . aspirin 81 MG tablet Take 81 mg by mouth daily.      Marland Kitchen atorvastatin (LIPITOR) 20 MG tablet TAKE 1 TABLET DAILY  90 tablet   1  . clopidogrel (PLAVIX) 75 MG tablet Take 75 mg by mouth daily.      Marland Kitchen gabapentin (NEURONTIN) 600 MG tablet TAKE 1 TABLET DAILY  90 tablet  3  . lisinopril (PRINIVIL,ZESTRIL) 20 MG tablet TAKE 1 TABLET DAILY  90 tablet  3  . nortriptyline (PAMELOR) 75 MG capsule TAKE 1 CAPSULE DAILY  90 capsule  3  . prochlorperazine (COMPAZINE) 10 MG tablet Take 1 tablet (10 mg total) by mouth every 6 (six) hours as needed.  60 tablet  0  . tamsulosin (FLOMAX) 0.4 MG CAPS TAKE 1 CAPSULE DAILY  90 capsule  3   No current facility-administered medications on file prior to visit.    BP 130/72  Pulse 110  Temp(Src) 98.3 F (36.8 C) (Oral)  Resp 20  Ht 5' 6.25" (1.683 m)  Wt 144 lb (65.318 kg)  BMI 23.06 kg/m2  SpO2 96%  Here for Medicare AWV:  1. Risk factors based on Past M, S, F history: cardiovascular risk factors include hypertension, and dyslipidemia  2. Physical Activities: patient is disabled, but has no significant physical limitations. He has chronic right-sided mild hemiparesis  3. Depression/mood: no history of depression or mood disorder  4. Hearing: no deficits  5. ADL's: independent in all aspects of daily living  6. Fall Risk: low  7. Home Safety: no problems identified  8. Height, weight, &visual acuity:height and weight are stable. No change in visual acuity  9. Counseling: heart healthy diet. Encouraged  10. Labs ordered based on risk factors: laboratory studies will be checked fasting. Next office visit  11. 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 Care Plan-more frequent and aggressive exercise regimen encouraged  13. Cognitive Assessment- alert and oriented normal affect. No memory disturbance handles all executive functioning without difficulty     Review of Systems  Constitutional: Positive for activity change, appetite change and fatigue. Negative for fever and chills.  HENT: Negative for hearing loss, ear pain, congestion, sore  throat, trouble swallowing, neck stiffness, dental problem, voice change and tinnitus.   Eyes: Negative for pain, discharge and visual disturbance.  Respiratory: Positive for shortness of breath. Negative for cough, chest tightness, wheezing and stridor.   Cardiovascular: Negative for chest pain, palpitations and leg swelling.  Gastrointestinal: Positive for nausea and diarrhea. Negative for vomiting, abdominal pain, constipation, blood in stool and abdominal distention.  Genitourinary: Negative for urgency, hematuria, flank pain, discharge, difficulty urinating and genital sores.  Musculoskeletal: Negative for myalgias, back pain, joint swelling, arthralgias and gait problem.  Skin: Negative for rash.  Neurological: Positive for weakness. Negative for dizziness, syncope, speech difficulty, numbness and headaches.  Hematological: Negative for adenopathy. Does not bruise/bleed easily.  Psychiatric/Behavioral: Negative for behavioral problems and dysphoric mood. The patient is not nervous/anxious.  Objective:   Physical Exam  Constitutional: He appears well-developed and well-nourished.  HENT:  Head: Normocephalic and atraumatic.  Right Ear: External ear normal.  Left Ear: External ear normal.  Nose: Nose normal.  Mouth/Throat: Oropharynx is clear and moist.  Upper dentures Poor dental hygiene lower plate  Eyes: Conjunctivae and EOM are normal. Pupils are equal, round, and reactive to light. No scleral icterus.  Neck: Normal range of motion. Neck supple. No JVD present. No thyromegaly present.  Cardiovascular: Regular rhythm and normal heart sounds.  Exam reveals no gallop and no friction rub.   No murmur heard. Pedal pulses not easily palpable but no ischemic changes  Pulmonary/Chest: Effort normal. He exhibits no tenderness.  Generally diminished breath sounds Few scattered rhonchi and faint wheezing  Abdominal: Soft. Bowel sounds are normal. He exhibits no distension and no  mass. There is no tenderness.  Genitourinary: Prostate normal and penis normal.  Musculoskeletal: Normal range of motion. He exhibits no edema and no tenderness.  Lymphadenopathy:    He has no cervical adenopathy.  Neurological: He is alert. He has normal reflexes. No cranial nerve deficit. Coordination normal.  Skin: Skin is warm and dry. No rash noted.  Gen. dry skin  Psychiatric: He has a normal mood and affect. His behavior is normal.          Assessment & Plan:   Preventive health examination COPD the patient does have some shortness of breath especially with exertion he wishes to try inhalational medications. Samples of Symbicort dispensed as well as a prescription for when necessary albuterol Hypertension stable Dyslipidemia. Continue atorvastatin PAD Metastatic lung cancer. Followup oncology  Recheck 6 months Flu vaccine at that time Followup oncology

## 2012-06-05 NOTE — Patient Instructions (Signed)
Followup oncology  Limit your sodium (Salt) intake    It is important that you exercise regularly, at least 20 minutes 3 to 4 times per week.  If you develop chest pain or shortness of breath seek  medical attention.  You should stop smoking immediately.  Return in 6 months for follow-up

## 2012-06-06 ENCOUNTER — Ambulatory Visit (HOSPITAL_BASED_OUTPATIENT_CLINIC_OR_DEPARTMENT_OTHER): Payer: Medicare Other | Admitting: Internal Medicine

## 2012-06-06 ENCOUNTER — Ambulatory Visit (HOSPITAL_BASED_OUTPATIENT_CLINIC_OR_DEPARTMENT_OTHER): Payer: Medicare Other

## 2012-06-06 ENCOUNTER — Other Ambulatory Visit: Payer: Medicare Other

## 2012-06-06 ENCOUNTER — Telehealth: Payer: Self-pay | Admitting: Internal Medicine

## 2012-06-06 ENCOUNTER — Other Ambulatory Visit (HOSPITAL_BASED_OUTPATIENT_CLINIC_OR_DEPARTMENT_OTHER): Payer: Medicare Other | Admitting: Lab

## 2012-06-06 DIAGNOSIS — C778 Secondary and unspecified malignant neoplasm of lymph nodes of multiple regions: Secondary | ICD-10-CM

## 2012-06-06 DIAGNOSIS — C3410 Malignant neoplasm of upper lobe, unspecified bronchus or lung: Secondary | ICD-10-CM

## 2012-06-06 DIAGNOSIS — Z5111 Encounter for antineoplastic chemotherapy: Secondary | ICD-10-CM

## 2012-06-06 LAB — CBC WITH DIFFERENTIAL/PLATELET
Eosinophils Absolute: 0 10*3/uL (ref 0.0–0.5)
HCT: 30.3 % — ABNORMAL LOW (ref 38.4–49.9)
LYMPH%: 27.6 % (ref 14.0–49.0)
MONO#: 1 10*3/uL — ABNORMAL HIGH (ref 0.1–0.9)
NEUT#: 3.4 10*3/uL (ref 1.5–6.5)
Platelets: 122 10*3/uL — ABNORMAL LOW (ref 140–400)
RBC: 3.48 10*6/uL — ABNORMAL LOW (ref 4.20–5.82)
WBC: 6.2 10*3/uL (ref 4.0–10.3)
nRBC: 0 % (ref 0–0)

## 2012-06-06 LAB — COMPREHENSIVE METABOLIC PANEL (CC13)
ALT: 16 U/L (ref 0–55)
Albumin: 3 g/dL — ABNORMAL LOW (ref 3.5–5.0)
CO2: 21 mEq/L — ABNORMAL LOW (ref 22–29)
Calcium: 9 mg/dL (ref 8.4–10.4)
Chloride: 104 mEq/L (ref 98–107)
Creatinine: 0.7 mg/dL (ref 0.7–1.3)
Total Protein: 7.1 g/dL (ref 6.4–8.3)

## 2012-06-06 MED ORDER — ONDANSETRON 16 MG/50ML IVPB (CHCC)
16.0000 mg | Freq: Once | INTRAVENOUS | Status: AC
  Administered 2012-06-06: 16 mg via INTRAVENOUS

## 2012-06-06 MED ORDER — DEXAMETHASONE SODIUM PHOSPHATE 20 MG/5ML IJ SOLN
20.0000 mg | Freq: Once | INTRAMUSCULAR | Status: AC
  Administered 2012-06-06: 20 mg via INTRAVENOUS

## 2012-06-06 MED ORDER — SODIUM CHLORIDE 0.9 % IV SOLN
Freq: Once | INTRAVENOUS | Status: AC
  Administered 2012-06-06: 10:00:00 via INTRAVENOUS

## 2012-06-06 MED ORDER — SODIUM CHLORIDE 0.9 % IV SOLN
578.0000 mg | Freq: Once | INTRAVENOUS | Status: AC
  Administered 2012-06-06: 580 mg via INTRAVENOUS
  Filled 2012-06-06: qty 58

## 2012-06-06 MED ORDER — PACLITAXEL PROTEIN-BOUND CHEMO INJECTION 100 MG
100.0000 mg/m2 | Freq: Once | INTRAVENOUS | Status: AC
  Administered 2012-06-06: 175 mg via INTRAVENOUS
  Filled 2012-06-06: qty 35

## 2012-06-06 NOTE — Progress Notes (Signed)
[facility] Telephone:([phone]) [phone]   [phone]  OFFICE PROGRESS NOTE  [name], MD [address] [place] [address]  DIAGNOSIS: Metastatic non-small cell lung cancer, squamous cell carcinoma   PRIOR THERAPY: None   CURRENT THERAPY: Systemic chemotherapy with carboplatin for an AUC of 5 given on day 1 and Abraxane at 100 mg per meter squared given on days 1, 8 and 15 every 3 weeks. Status post 2 cycles.  INTERVAL HISTORY: [name] 62 y.o. male returns to the clinic today for follow up visit accompanied by his wife. The patient tolerated the last cycle of her systemic chemotherapy with carboplatin and Abraxane fairly well with no significant adverse effects. He denied having any significant nausea or vomiting. He has no chest pain, shortness breath, cough or hemoptysis. The patient denied having any fever or chills. No significant weight loss or night sweats.he missed day 15 of the second cycle secondary to neutropenia.  MEDICAL HISTORY: Past Medical History  Diagnosis Date  . BENIGN PROSTATIC HYPERTROPHY 12/24/2009  . CEREBROVASCULAR ACCIDENT, HX OF 12/24/2009  . HYPERLIPIDEMIA 12/24/2009  . HYPERTENSION 12/24/2009  . Testosterone deficiency   . ED (erectile dysfunction)   . Claudication in peripheral vascular disease, lifestyle limiting, with abnormal arterial dopplers of lower ext. 06/30/2011  . S/P angioplasty with stent to Lt. SFA 06/30/11 06/30/2011  . COPD 12/24/2009  . Shortness of breath     "from the COPD" (10/13/2011)  . Stroke 1996    "right side not as strong as left since; numbness R leg/foot" (10/13/2011)  . Pernicious anemia     ALLERGIES:  has No Known Allergies.  MEDICATIONS:  Current Outpatient Prescriptions  Medication Sig Dispense Refill  . acetaminophen (TYLENOL) 500 MG tablet Take 1,000 mg by mouth every 6 (six) hours as needed. For pain      . albuterol (PROVENTIL HFA;VENTOLIN HFA) 108 (90 BASE)  MCG/ACT inhaler Inhale 2 puffs into the lungs every 6 (six) hours as needed for wheezing.  1 Inhaler  0  . aspirin 81 MG tablet Take 81 mg by mouth daily.      Marland Kitchen atorvastatin (LIPITOR) 20 MG tablet TAKE 1 TABLET DAILY  90 tablet  1  . budesonide-formoterol (SYMBICORT) 160-4.5 MCG/ACT inhaler Inhale 2 puffs into the lungs 2 (two) times daily.  1 Inhaler  12  . clopidogrel (PLAVIX) 75 MG tablet Take 75 mg by mouth daily.      Marland Kitchen gabapentin (NEURONTIN) 600 MG tablet TAKE 1 TABLET DAILY  90 tablet  3  . lisinopril (PRINIVIL,ZESTRIL) 20 MG tablet TAKE 1 TABLET DAILY  90 tablet  3  . nortriptyline (PAMELOR) 75 MG capsule TAKE 1 CAPSULE DAILY  90 capsule  3  . prochlorperazine (COMPAZINE) 10 MG tablet Take 1 tablet (10 mg total) by mouth every 6 (six) hours as needed.  60 tablet  0  . tamsulosin (FLOMAX) 0.4 MG CAPS TAKE 1 CAPSULE DAILY  90 capsule  3   No current facility-administered medications for this visit.    SURGICAL HISTORY:  Past Surgical History  Procedure Laterality Date  . Cervical fusion  1980's    "total of 3-4 OR's on my neck; pinched nerve" (10/13/2011)  . Aneurysm coiling  2007  . Peripheral arterial stent graft  06/30/2010; 10/13/10    left; left  . Video bronchoscopy Bilateral 04/03/2012    Procedure: VIDEO BRONCHOSCOPY WITHOUT FLUORO;  Surgeon: Nyoka Cowden, MD;  Location: [facility];  Service: Cardiopulmonary;  Laterality: Bilateral;    REVIEW OF SYSTEMS:  A comprehensive review of systems was negative except for: Constitutional: positive for fatigue   PHYSICAL EXAMINATION: General appearance: alert, cooperative, fatigued and no distress Head: Normocephalic, without obvious abnormality, atraumatic Neck: no adenopathy Lymph nodes: Cervical, supraclavicular, and axillary nodes normal. Resp: clear to auscultation bilaterally Cardio: regular rate and rhythm, S1, S2 normal, no murmur, click, rub or gallop GI: soft, non-tender; bowel sounds normal; no masses,  no  organomegaly Extremities: extremities normal, atraumatic, no cyanosis or edema  ECOG PERFORMANCE STATUS: 1 - Symptomatic but completely ambulatory  There were no vitals taken for this visit.  LABORATORY DATA: Lab Results  Component Value Date   WBC 6.2 06/06/2012   HGB 10.0* 06/06/2012   HCT 30.3* 06/06/2012   MCV 87.1 06/06/2012   PLT 122* 06/06/2012      Chemistry      Component Value Date/Time   NA 132* 05/23/2012 0912   NA 133* 10/14/2011 0725   K 4.3 05/23/2012 0912   K 4.1 10/14/2011 0725   CL 100 05/23/2012 0912   CL 99 10/14/2011 0725   CO2 24 05/23/2012 0912   CO2 24 10/14/2011 0725   BUN 11.4 05/23/2012 0912   BUN 8 10/14/2011 0725   CREATININE 0.8 05/23/2012 0912   CREATININE 0.78 10/14/2011 0725      Component Value Date/Time   CALCIUM 9.2 05/23/2012 0912   CALCIUM 9.2 10/14/2011 0725   ALKPHOS 112 05/23/2012 0912   ALKPHOS 79 10/19/2010 0812   AST 15 05/23/2012 0912   AST 10 10/19/2010 0812   ALT 19 05/23/2012 0912   ALT 8 10/19/2010 0812   BILITOT 0.28 05/23/2012 0912   BILITOT 0.3 10/19/2010 0812       RADIOGRAPHIC STUDIES: No results found.  ASSESSMENT: this is a very pleasant 62 years old white male with metastatic non-small cell lung cancer currently undergoing systemic chemotherapy with carboplatin and Abraxane status post 2 cycles.   PLAN: the patient is doing fine today. We'll proceed with cycle #3 today as scheduled. He would come back for follow up visit in 3 weeks with repeat CT scan of the chest, abdomen and pelvis for restaging of his disease. He was advised to call immediately if he has any concerning symptoms in the interval.  All questions were answered. The patient knows to call the clinic with any problems, questions or concerns. We can certainly see the patient much sooner if necessary.

## 2012-06-06 NOTE — Patient Instructions (Addendum)
[facility] Discharge Instructions for Patients Receiving Chemotherapy  Today you received the following chemotherapy agents: Chemotherapy, Abraxane. To help prevent nausea and vomiting after your treatment, we encourage you to take your nausea medication.   If you develop nausea and vomiting that is not controlled by your nausea medication, call the clinic.    BELOW ARE SYMPTOMS THAT SHOULD BE REPORTED IMMEDIATELY:  *FEVER GREATER THAN 100.5 F  *CHILLS WITH OR WITHOUT FEVER  NAUSEA AND VOMITING THAT IS NOT CONTROLLED WITH YOUR NAUSEA MEDICATION  *UNUSUAL SHORTNESS OF BREATH  *UNUSUAL BRUISING OR BLEEDING  TENDERNESS IN MOUTH AND THROAT WITH OR WITHOUT PRESENCE OF ULCERS  *URINARY PROBLEMS  *BOWEL PROBLEMS  UNUSUAL RASH Items with * indicate a potential emergency and should be followed up as soon as possible.   Feel free to call the clinic you have any questions or concerns. The clinic phone number is [phone].

## 2012-06-07 ENCOUNTER — Encounter: Payer: Self-pay | Admitting: Internal Medicine

## 2012-06-07 ENCOUNTER — Other Ambulatory Visit: Payer: Self-pay | Admitting: *Deleted

## 2012-06-07 NOTE — Patient Instructions (Signed)
Continue chemotherapy as scheduled. Followup visit in 3 weeks with repeat CT scan of the chest, abdomen and pelvis

## 2012-06-13 ENCOUNTER — Ambulatory Visit (HOSPITAL_BASED_OUTPATIENT_CLINIC_OR_DEPARTMENT_OTHER): Payer: Medicare Other

## 2012-06-13 ENCOUNTER — Other Ambulatory Visit (HOSPITAL_BASED_OUTPATIENT_CLINIC_OR_DEPARTMENT_OTHER): Payer: Medicare Other | Admitting: Lab

## 2012-06-13 DIAGNOSIS — C778 Secondary and unspecified malignant neoplasm of lymph nodes of multiple regions: Secondary | ICD-10-CM

## 2012-06-13 DIAGNOSIS — C3410 Malignant neoplasm of upper lobe, unspecified bronchus or lung: Secondary | ICD-10-CM

## 2012-06-13 DIAGNOSIS — Z5111 Encounter for antineoplastic chemotherapy: Secondary | ICD-10-CM

## 2012-06-13 LAB — COMPREHENSIVE METABOLIC PANEL (CC13)
ALT: 12 U/L (ref 0–55)
Albumin: 3.1 g/dL — ABNORMAL LOW (ref 3.5–5.0)
CO2: 25 mEq/L (ref 22–29)
Calcium: 9 mg/dL (ref 8.4–10.4)
Chloride: 97 mEq/L — ABNORMAL LOW (ref 98–107)
Glucose: 123 mg/dl — ABNORMAL HIGH (ref 70–99)
Sodium: 132 mEq/L — ABNORMAL LOW (ref 136–145)
Total Protein: 7.3 g/dL (ref 6.4–8.3)

## 2012-06-13 LAB — CBC WITH DIFFERENTIAL/PLATELET
BASO%: 1.6 % (ref 0.0–2.0)
HCT: 27.8 % — ABNORMAL LOW (ref 38.4–49.9)
MCHC: 33.5 g/dL (ref 32.0–36.0)
MONO#: 0.1 10*3/uL (ref 0.1–0.9)
RBC: 3.24 10*6/uL — ABNORMAL LOW (ref 4.20–5.82)
RDW: 16.2 % — ABNORMAL HIGH (ref 11.0–14.6)
WBC: 2.5 10*3/uL — ABNORMAL LOW (ref 4.0–10.3)
lymph#: 0.8 10*3/uL — ABNORMAL LOW (ref 0.9–3.3)
nRBC: 0 % (ref 0–0)

## 2012-06-13 MED ORDER — PACLITAXEL PROTEIN-BOUND CHEMO INJECTION 100 MG
100.0000 mg/m2 | Freq: Once | INTRAVENOUS | Status: AC
  Administered 2012-06-13: 175 mg via INTRAVENOUS
  Filled 2012-06-13: qty 35

## 2012-06-13 MED ORDER — DEXAMETHASONE SODIUM PHOSPHATE 10 MG/ML IJ SOLN
10.0000 mg | Freq: Once | INTRAMUSCULAR | Status: AC
  Administered 2012-06-13: 10 mg via INTRAVENOUS

## 2012-06-13 MED ORDER — SODIUM CHLORIDE 0.9 % IV SOLN
Freq: Once | INTRAVENOUS | Status: AC
  Administered 2012-06-13: 14:00:00 via INTRAVENOUS

## 2012-06-13 MED ORDER — ONDANSETRON 8 MG/50ML IVPB (CHCC)
8.0000 mg | Freq: Once | INTRAVENOUS | Status: AC
  Administered 2012-06-13: 8 mg via INTRAVENOUS

## 2012-06-13 NOTE — Progress Notes (Signed)
Reviewed WBC/ANC with Dr. [name]. OK to treat. Does not need neupogen at this time. Will see him next week- Reviewed with patient and wife Neutropenic Precautions-were able to verbalize when to call MD

## 2012-06-13 NOTE — Patient Instructions (Addendum)
[facility] Discharge Instructions for Patients Receiving Chemotherapy  Today you received the following chemotherapy agent: Abraxane  To help prevent nausea and vomiting after your treatment, we encourage you to take your nausea medication : Compazine 10 mg every 6 hours as needed Begin taking it at 1st sign of nausea and take it as often as prescribed.   If you develop nausea and vomiting that is not controlled by your nausea medication, call the clinic. If it is after clinic hours your family physician or the after hours number for the clinic or go to the Emergency Department.   BELOW ARE SYMPTOMS THAT SHOULD BE REPORTED IMMEDIATELY:  *FEVER GREATER THAN 100.5 F  *CHILLS WITH OR WITHOUT FEVER  NAUSEA AND VOMITING THAT IS NOT CONTROLLED WITH YOUR NAUSEA MEDICATION  *UNUSUAL SHORTNESS OF BREATH  *UNUSUAL BRUISING OR BLEEDING  TENDERNESS IN MOUTH AND THROAT WITH OR WITHOUT PRESENCE OF ULCERS  *URINARY PROBLEMS  *BOWEL PROBLEMS  UNUSUAL RASH Items with * indicate a potential emergency and should be followed up as soon as possible.  Feel free to call the clinic you have any questions or concerns. The clinic phone number is [phone].   I have been informed and understand all the instructions given to me. I know to contact the clinic, my physician, or go to the Emergency Department if any problems should occur. I do not have any questions at this time, but understand that I may call the clinic during office hours   should I have any questions or need assistance in obtaining follow up care.    __________________________________________  _____________  __________ Signature of Patient or Authorized Representative            Date                   Time    __________________________________________ Nurse's Signature   Anemia, Frequently Asked Questions WHAT ARE THE SYMPTOMS OF ANEMIA?  Headache.  Difficulty thinking.  Fatigue.  Shortness of  breath.  Weakness.  Rapid heartbeat. AT WHAT POINT ARE PEOPLE CONSIDERED ANEMIC?  This varies with gender and age.   Both hemoglobin (Hgb) and hematocrit values are used to define anemia. These lab values are obtained from a complete blood count (CBC) test. This is performed at a caregiver's office.  The normal range of hemoglobin values for adult men is 14.0 g/dL to 21.3 g/dL. For nonpregnant women, values are 12.3 g/dL to 08.6 g/dL.  The World Health Organization defines anemia as less than 12 g/dL for nonpregnant women and less than 13 g/dL for men.  For adult males, the average normal hematocrit is 46%, and the range is 40% to 52%.  For adult females, the average normal hematocrit is 41%, and the range is 35% to 47%.  Values that fall below the lower limits can be a sign of anemia and should have further checking (evaluation). GROUPS OF PEOPLE WHO ARE AT RISK FOR DEVELOPING ANEMIA INCLUDE:   Infants who are breastfed or taking a formula that is not fortified with iron.  Children going through a rapid growth spurt. The iron available can not keep up with the needs for a red cell mass which must grow with the child.  Women in childbearing years. They need iron because of blood loss during menstruation.  Pregnant women. The growing fetus creates a high demand for iron.  People with ongoing gastrointestinal blood loss are at risk of developing iron deficiency.  Individuals with leukemia  or cancer who must receive chemotherapy or radiation to treat their disease. The drugs or radiation used to treat these diseases often decreases the bone marrow's ability to make cells of all classes. This includes red blood cells, white blood cells, and platelets.  Individuals with chronic inflammatory conditions such as rheumatoid arthritis or chronic infections.  The elderly. ARE SOME TYPES OF ANEMIA INHERITED?   Yes, some types of anemia are due to inherited or genetic defects.  Sickle cell  anemia. This occurs most often in people of African, African American, and Mediterranean descent.  Thalassemia (or Cooley's anemia). This type is found in people of Mediterranean and Southeast Asian descent. These types of anemia are common.  Fanconi. This is rare. CAN CERTAIN MEDICATIONS CAUSE A PERSON TO BECOME ANEMIC?  Yes. For example, drugs to fight cancer (chemotherapeutic agents) often cause anemia. These drugs can slow the bone marrow's ability to make red blood cells. If there are not enough red blood cells, the body does not get enough oxygen. WHAT HEMATOCRIT LEVEL IS REQUIRED TO DONATE BLOOD?  The lower limit of an acceptable hematocrit for blood donors is 38%. If you have a low hematocrit value, you should schedule an appointment with your caregiver. ARE BLOOD TRANSFUSIONS COMMONLY USED TO CORRECT ANEMIA, AND ARE THEY DANGEROUS?  They are used to treat anemia as a last resort. Your caregiver will find the cause of the anemia and correct it if possible. Most blood transfusions are given because of excessive bleeding at the time of surgery, with trauma, or because of bone marrow suppression in patients with cancer or leukemia on chemotherapy. Blood transfusions are safer than ever before. We also know that blood transfusions affect the immune system and may increase certain risks. There is also a concern for human error. In 1/16,000 transfusions, a patient receives a transfusion of blood that is not matched with his or her blood type.  WHAT IS IRON DEFICIENCY ANEMIA AND CAN I CORRECT IT BY CHANGING MY DIET?  Iron is an essential part of hemoglobin. Without enough hemoglobin, anemia develops and the body does not get the right amount of oxygen. Iron deficiency anemia develops after the body has had a low level of iron for a long time. This is either caused by blood loss, not taking in or absorbing enough iron, or increased demands for iron (like pregnancy or rapid growth).  Foods from animal  origin such as beef, chicken, and pork, are good sources of iron. Be sure to have one of these foods at each meal. Vitamin C helps your body absorb iron. Foods rich in Vitamin C include citrus, bell pepper, strawberries, spinach and cantaloupe. In some cases, iron supplements may be needed in order to correct the iron deficiency. In the case of poor absorption, extra iron may have to be given directly into the vein through a needle (intravenously). I HAVE BEEN DIAGNOSED WITH IRON DEFICIENCY ANEMIA AND MY CAREGIVER PRESCRIBED IRON SUPPLEMENTS. HOW LONG WILL IT TAKE FOR MY BLOOD TO BECOME NORMAL?  It depends on the degree of anemia at the beginning of treatment. Most people with mild to moderate iron deficiency, anemia will correct the anemia over a period of 2 to 3 months. But after the anemia is corrected, the iron stored by the body is still low. Caregivers often suggest an additional 6 months of oral iron therapy once the anemia has been reversed. This will help prevent the iron deficiency anemia from quickly happening again. Non-anemic adult males should  take iron supplements only under the direction of a doctor, too much iron can cause liver damage.  MY HEMOGLOBIN IS 9 G/DL AND I AM SCHEDULED FOR SURGERY. SHOULD I POSTPONE THE SURGERY?  If you have Hgb of 9, you should discuss this with your caregiver right away. Many patients with similar hemoglobin levels have had surgery without problems. If minimal blood loss is expected for a minor procedure, no treatment may be necessary.  If a greater blood loss is expected for more extensive procedures, you should ask your caregiver about being treated with erythropoietin and iron. This is to accelerate the recovery of your hemoglobin to a normal level before surgery. An anemic patient who undergoes high-blood-loss surgery has a greater risk of surgical complications and need for a blood transfusion, which also carries some risk.  I HAVE BEEN TOLD THAT HEAVY  MENSTRUAL PERIODS CAUSE ANEMIA. IS THERE ANYTHING I CAN DO TO PREVENT THE ANEMIA?  Anemia that results from heavy periods is usually due to iron deficiency. You can try to meet the increased demands for iron caused by the heavy monthly blood loss by increasing the intake of iron-rich foods. Iron supplements may be required. Discuss your concerns with your caregiver. WHAT CAUSES ANEMIA DURING PREGNANCY?  Pregnancy places major demands on the body. The mother must meet the needs of both her body and her growing baby. The body needs enough iron and folate to make the right amount of red blood cells. To prevent anemia while pregnant, the mother should stay in close contact with her caregiver.  Be sure to eat a diet that has foods rich in iron and folate like liver and dark green leafy vegetables. Folate plays an important role in the normal development of a baby's spinal cord. Folate can help prevent serious disorders like spina bifida. If your diet does not provide adequate nutrients, you may want to talk with your caregiver about nutritional supplements.  WHAT IS THE RELATIONSHIP BETWEEN FIBROID TUMORS AND ANEMIA IN WOMEN?  The relationship is usually caused by the increased menstrual blood loss caused by fibroids. Good iron intake may be required to prevent iron deficiency anemia from developing.  Document Released: 08/11/2003 Document Revised: 03/27/2011 Document Reviewed: 01/25/2010 [facility] Patient Information 2014 [place], [place].

## 2012-06-14 ENCOUNTER — Other Ambulatory Visit: Payer: Self-pay

## 2012-06-14 ENCOUNTER — Inpatient Hospital Stay (HOSPITAL_COMMUNITY)
Admission: EM | Admit: 2012-06-14 | Discharge: 2012-06-20 | DRG: 871 | Disposition: A | Discharge status: Home / Self Care | Payer: Medicare Other | Attending: Internal Medicine | Admitting: Internal Medicine

## 2012-06-14 ENCOUNTER — Emergency Department (HOSPITAL_COMMUNITY): Payer: Medicare Other

## 2012-06-14 ENCOUNTER — Encounter (HOSPITAL_COMMUNITY): Payer: Self-pay

## 2012-06-14 ENCOUNTER — Other Ambulatory Visit: Payer: Self-pay | Admitting: Oncology

## 2012-06-14 DIAGNOSIS — Z79899 Other long term (current) drug therapy: Secondary | ICD-10-CM

## 2012-06-14 DIAGNOSIS — D703 Neutropenia due to infection: Secondary | ICD-10-CM | POA: Diagnosis present

## 2012-06-14 DIAGNOSIS — A419 Sepsis, unspecified organism: Principal | ICD-10-CM | POA: Diagnosis present

## 2012-06-14 DIAGNOSIS — E785 Hyperlipidemia, unspecified: Secondary | ICD-10-CM | POA: Diagnosis present

## 2012-06-14 DIAGNOSIS — R159 Full incontinence of feces: Secondary | ICD-10-CM | POA: Diagnosis present

## 2012-06-14 DIAGNOSIS — R000 Tachycardia, unspecified: Secondary | ICD-10-CM

## 2012-06-14 DIAGNOSIS — J4489 Other specified chronic obstructive pulmonary disease: Secondary | ICD-10-CM | POA: Diagnosis present

## 2012-06-14 DIAGNOSIS — I472 Ventricular tachycardia, unspecified: Secondary | ICD-10-CM | POA: Diagnosis not present

## 2012-06-14 DIAGNOSIS — I4729 Other ventricular tachycardia: Secondary | ICD-10-CM | POA: Diagnosis not present

## 2012-06-14 DIAGNOSIS — J449 Chronic obstructive pulmonary disease, unspecified: Secondary | ICD-10-CM

## 2012-06-14 DIAGNOSIS — D709 Neutropenia, unspecified: Secondary | ICD-10-CM

## 2012-06-14 DIAGNOSIS — R5081 Fever presenting with conditions classified elsewhere: Secondary | ICD-10-CM

## 2012-06-14 DIAGNOSIS — Z8673 Personal history of transient ischemic attack (TIA), and cerebral infarction without residual deficits: Secondary | ICD-10-CM

## 2012-06-14 DIAGNOSIS — Z7902 Long term (current) use of antithrombotics/antiplatelets: Secondary | ICD-10-CM

## 2012-06-14 DIAGNOSIS — R197 Diarrhea, unspecified: Secondary | ICD-10-CM | POA: Diagnosis present

## 2012-06-14 DIAGNOSIS — I951 Orthostatic hypotension: Secondary | ICD-10-CM

## 2012-06-14 DIAGNOSIS — T451X5A Adverse effect of antineoplastic and immunosuppressive drugs, initial encounter: Secondary | ICD-10-CM | POA: Diagnosis present

## 2012-06-14 DIAGNOSIS — I10 Essential (primary) hypertension: Secondary | ICD-10-CM | POA: Diagnosis present

## 2012-06-14 DIAGNOSIS — C772 Secondary and unspecified malignant neoplasm of intra-abdominal lymph nodes: Secondary | ICD-10-CM | POA: Diagnosis present

## 2012-06-14 DIAGNOSIS — C3490 Malignant neoplasm of unspecified part of unspecified bronchus or lung: Secondary | ICD-10-CM | POA: Diagnosis present

## 2012-06-14 DIAGNOSIS — J189 Pneumonia, unspecified organism: Secondary | ICD-10-CM | POA: Diagnosis present

## 2012-06-14 DIAGNOSIS — N400 Enlarged prostate without lower urinary tract symptoms: Secondary | ICD-10-CM | POA: Diagnosis present

## 2012-06-14 DIAGNOSIS — E876 Hypokalemia: Secondary | ICD-10-CM

## 2012-06-14 LAB — CBC WITH DIFFERENTIAL/PLATELET
Basophils Relative: 0 % (ref 0–1)
Eosinophils Absolute: 0 10*3/uL (ref 0.0–0.7)
HCT: 27.4 % — ABNORMAL LOW (ref 39.0–52.0)
Hemoglobin: 9.3 g/dL — ABNORMAL LOW (ref 13.0–17.0)
Lymphocytes Relative: 72 % — ABNORMAL HIGH (ref 12–46)
MCHC: 33.9 g/dL (ref 30.0–36.0)
MCV: 84 fL (ref 78.0–100.0)
Neutro Abs: 0.1 10*3/uL — ABNORMAL LOW (ref 1.7–7.7)

## 2012-06-14 LAB — COMPREHENSIVE METABOLIC PANEL
Alkaline Phosphatase: 115 U/L (ref 39–117)
BUN: 19 mg/dL (ref 6–23)
Chloride: 91 mEq/L — ABNORMAL LOW (ref 96–112)
GFR calc Af Amer: 77 mL/min — ABNORMAL LOW (ref >90)
Glucose, Bld: 95 mg/dL (ref 70–99)
Potassium: 4 mEq/L (ref 3.5–5.1)
Total Bilirubin: 0.5 mg/dL (ref 0.3–1.2)
Total Protein: 7.4 g/dL (ref 6.0–8.3)

## 2012-06-14 LAB — CG4 I-STAT (LACTIC ACID): Lactic Acid, Venous: 1.97 mmol/L (ref 0.5–2.2)

## 2012-06-14 MED ORDER — ASPIRIN 81 MG PO CHEW
81.0000 mg | CHEWABLE_TABLET | Freq: Once | ORAL | Status: DC
  Filled 2012-06-14: qty 1

## 2012-06-14 MED ORDER — LEVALBUTEROL HCL 0.63 MG/3ML IN NEBU: 0.6300 mg | INHALATION_SOLUTION | Freq: Four times a day (QID) | RESPIRATORY_TRACT | Status: DC | PRN

## 2012-06-14 MED ORDER — SODIUM CHLORIDE 0.9 % IV SOLN: Freq: Once | INTRAVENOUS | Status: DC

## 2012-06-14 MED ORDER — SODIUM CHLORIDE 0.9 % IV BOLUS (SEPSIS)
1000.0000 mL | Freq: Once | INTRAVENOUS | Status: AC
  Administered 2012-06-14: 1000 mL via INTRAVENOUS

## 2012-06-14 MED ORDER — SODIUM CHLORIDE 0.9 % IV SOLN: INTRAVENOUS | Status: AC

## 2012-06-14 MED ORDER — ACETAMINOPHEN 325 MG PO TABS
650.0000 mg | ORAL_TABLET | Freq: Four times a day (QID) | ORAL | Status: DC | PRN
  Filled 2012-06-14: qty 2

## 2012-06-14 MED ORDER — PIPERACILLIN-TAZOBACTAM 3.375 G IVPB
3.3750 g | Freq: Once | INTRAVENOUS | Status: AC
  Administered 2012-06-14: 3.375 g via INTRAVENOUS
  Filled 2012-06-14: qty 50

## 2012-06-14 MED ORDER — ACETAMINOPHEN 650 MG RE SUPP: 650.0000 mg | Freq: Four times a day (QID) | RECTAL | Status: DC | PRN

## 2012-06-14 MED ORDER — SODIUM CHLORIDE 0.9 % IJ SOLN
3.0000 mL | Freq: Two times a day (BID) | INTRAMUSCULAR | Status: DC
  Administered 2012-06-17 – 2012-06-18 (×2): 3 mL via INTRAVENOUS

## 2012-06-14 MED ORDER — ENOXAPARIN SODIUM 40 MG/0.4ML ~~LOC~~ SOLN
40.0000 mg | Freq: Every day | SUBCUTANEOUS | Status: DC
  Administered 2012-06-15 – 2012-06-19 (×6): 40 mg via SUBCUTANEOUS
  Filled 2012-06-14 (×8): qty 0.4

## 2012-06-14 MED ORDER — ACETAMINOPHEN 325 MG PO TABS
650.0000 mg | ORAL_TABLET | Freq: Four times a day (QID) | ORAL | Status: DC | PRN
  Administered 2012-06-14 – 2012-06-15 (×2): 650 mg via ORAL
  Filled 2012-06-14: qty 2

## 2012-06-14 MED ORDER — VANCOMYCIN HCL IN DEXTROSE 1-5 GM/200ML-% IV SOLN
1000.0000 mg | Freq: Once | INTRAVENOUS | Status: AC
  Administered 2012-06-15: 1000 mg via INTRAVENOUS
  Filled 2012-06-14: qty 200

## 2012-06-14 NOTE — ED Notes (Signed)
MD at bedside. 

## 2012-06-14 NOTE — ED Notes (Signed)
Reported white count to [facility] RN  0.4

## 2012-06-14 NOTE — Progress Notes (Signed)
Phone note:  62 yo man with lung cancer; s/p chemo Carboplatin/Abraxane d1,d8,d15.  Day 8 of chemo was given yesterday 06/13/12.    He had slightly decreased WBC yesterday.  He has fever to 102F today.  I advised him to present to [facility] ED for evaluation for possible neutropenic fever.

## 2012-06-14 NOTE — ED Notes (Signed)
[id number]<AV> Expected date:<BR> Expected time:<BR> Means of arrival:<BR> Comments:<BR> [name]

## 2012-06-14 NOTE — ED Notes (Signed)
Patient transported to X-ray 

## 2012-06-14 NOTE — ED Notes (Signed)
Pt was made aware of need to wear mask. Pt does not want to wear mask at this time.

## 2012-06-14 NOTE — ED Notes (Signed)
Pt presents with wife with c/o dizziness and fever. Pt is a cancer patient and his last treatment was given yesterday. Pt was told yesterday as he was receiving chemo that his white and red blood counts were low. Pt started to have a fever today. Pt's fever was 102.5 at around 7:30 pm tonight. No tylenol or motrin given for fever. Pt also c/o dizziness that started this morning.

## 2012-06-14 NOTE — ED Provider Notes (Signed)
History     CSN: [id number]  Arrival date & time 06/14/12  2013   First MD Initiated Contact with Patient 06/14/12 2036      Chief Complaint  Patient presents with  . Dizziness  . Fever    (Consider location/radiation/quality/duration/timing/severity/associated sxs/prior treatment) Patient is a 62 y.o. male presenting with fever. The history is provided by the patient.  Fever Max temp prior to arrival:  102.5 Temp source:  Oral Severity:  Moderate Onset quality:  Sudden Timing:  Intermittent Progression:  Worsening Chronicity:  New Relieved by:  Nothing Worsened by:  Nothing tried Associated symptoms: nausea   Associated symptoms: no chills, no confusion, no cough, no diarrhea, no headaches, no myalgias, no rash, no rhinorrhea, no somnolence, no sore throat and no vomiting   Risk factors: hx of cancer and immunosuppression     Past Medical History  Diagnosis Date  . BENIGN PROSTATIC HYPERTROPHY 12/24/2009  . CEREBROVASCULAR ACCIDENT, HX OF 12/24/2009  . HYPERLIPIDEMIA 12/24/2009  . HYPERTENSION 12/24/2009  . Testosterone deficiency   . ED (erectile dysfunction)   . Claudication in peripheral vascular disease, lifestyle limiting, with abnormal arterial dopplers of lower ext. 06/30/2011  . S/P angioplasty with stent to Lt. SFA 06/30/11 06/30/2011  . COPD 12/24/2009  . Shortness of breath     "from the COPD" (10/13/2011)  . Stroke 1996    "right side not as strong as left since; numbness R leg/foot" (10/13/2011)  . Pernicious anemia     Past Surgical History  Procedure Laterality Date  . Cervical fusion  1980's    "total of 3-4 OR's on my neck; pinched nerve" (10/13/2011)  . Aneurysm coiling  2007  . Peripheral arterial stent graft  06/30/2010; 10/13/10    left; left  . Video bronchoscopy Bilateral 04/03/2012    Procedure: VIDEO BRONCHOSCOPY WITHOUT FLUORO;  Surgeon: Nyoka Cowden, MD;  Location: [facility] ENDOSCOPY;  Service: Cardiopulmonary;  Laterality: Bilateral;    Family  History  Problem Relation Age of Onset  . Emphysema Father     smoked  . Emphysema Paternal Uncle     smoked  . Heart disease Mother   . Lung cancer Father     smoked  . Lung cancer Paternal Grandfather     smoked    History  Substance Use Topics  . Smoking status: Current Every Day Smoker -- 1.00 packs/day for 45 years    Types: Cigarettes  . Smokeless tobacco: Never Used  . Alcohol Use: No      Review of Systems  Constitutional: Positive for fever and appetite change. Negative for chills.  HENT: Negative for sore throat and rhinorrhea.   Respiratory: Negative for cough, shortness of breath and wheezing.   Gastrointestinal: Positive for nausea. Negative for vomiting, abdominal pain and diarrhea.  Genitourinary: Negative for frequency and flank pain.  Musculoskeletal: Negative for myalgias.  Skin: Negative for rash.  Neurological: Positive for dizziness and weakness. Negative for headaches.  Psychiatric/Behavioral: Negative for confusion.    Allergies  Review of patient's allergies indicates no known allergies.  Home Medications   Current Outpatient Rx  Name  Route  Sig  Dispense  Refill  . acetaminophen (TYLENOL) 500 MG tablet   Oral   Take 1,000 mg by mouth every 6 (six) hours as needed. For pain         . aspirin 81 MG tablet   Oral   Take 81 mg by mouth daily.         Marland Kitchen  atorvastatin (LIPITOR) 20 MG tablet   Oral   Take 20 mg by mouth daily.         . budesonide-formoterol (SYMBICORT) 160-4.5 MCG/ACT inhaler   Inhalation   Inhale 2 puffs into the lungs 2 (two) times daily.   1 Inhaler   12   . clopidogrel (PLAVIX) 75 MG tablet   Oral   Take 75 mg by mouth daily.         Marland Kitchen gabapentin (NEURONTIN) 600 MG tablet   Oral   Take 600 mg by mouth every morning.          Marland Kitchen lisinopril (PRINIVIL,ZESTRIL) 20 MG tablet   Oral   Take 20 mg by mouth daily.         . nortriptyline (PAMELOR) 75 MG capsule   Oral   Take 75 mg by mouth at  bedtime.         . prochlorperazine (COMPAZINE) 10 MG tablet   Oral   Take 1 tablet (10 mg total) by mouth every 6 (six) hours as needed.   60 tablet   0   . tamsulosin (FLOMAX) 0.4 MG CAPS   Oral   Take by mouth.           BP 98/73  Pulse 133  Temp(Src) 100.7 F (38.2 C) (Oral)  Resp 22  SpO2 97%  Physical Exam  Nursing note and vitals reviewed. Constitutional: He is oriented to person, place, and time. He appears well-developed and well-nourished. No distress.  HENT:  Head: Normocephalic and atraumatic.  Mouth/Throat: Oropharynx is clear and moist.  Eyes: Pupils are equal, round, and reactive to light.  Neck: Normal range of motion.  Cardiovascular: Regular rhythm.  Tachycardia present.   Pulmonary/Chest: Effort normal and breath sounds normal. No respiratory distress.  Abdominal: He exhibits no distension. There is no tenderness.  Musculoskeletal: Normal range of motion. He exhibits no edema and no tenderness.  Neurological: He is alert and oriented to person, place, and time.  Skin: Skin is warm and dry. There is pallor.    ED Course  Procedures (including critical care time)  Labs Reviewed  CBC WITH DIFFERENTIAL - Abnormal; Notable for the following:    WBC 0.4 (*)    RBC 3.26 (*)    Hemoglobin 9.3 (*)    HCT 27.4 (*)    RDW 15.9 (*)    Neutrophils Relative % 25 (*)    Lymphocytes Relative 72 (*)    Neutro Abs 0.1 (*)    Lymphs Abs 0.3 (*)    Monocytes Absolute 0.0 (*)    All other components within normal limits  COMPREHENSIVE METABOLIC PANEL - Abnormal; Notable for the following:    Sodium 127 (*)    Chloride 91 (*)    Albumin 3.1 (*)    GFR calc non Af Amer 66 (*)    GFR calc Af Amer 77 (*)    All other components within normal limits  URINE CULTURE  CULTURE, BLOOD (ROUTINE X 2)  CULTURE, BLOOD (ROUTINE X 2)  URINALYSIS, ROUTINE W REFLEX MICROSCOPIC  PATHOLOGIST SMEAR REVIEW  CG4 I-STAT (LACTIC ACID)  POCT LACTIC ACID (LACTATE)   Dg  Chest 2 View  06/14/2012   *RADIOLOGY REPORT*  Clinical Data: Fever, productive cough.  CHEST - 2 VIEW  Comparison: 03/20/2012  Findings: There is hyperinflation of the lungs compatible with COPD.   Increased density anteriorly on the lateral view which is likely within the lingula concerning for pneumonia.  No focal opacity on the right.  No effusions or acute bony abnormality.  IMPRESSION: COPD.  Lingular pneumonia.   Original Report Authenticated By: Charlett Nose, M.D.     1. Neutropenia   2. HAP (hospital-acquired pneumonia)   3. Orthostatic hypotension    ED ECG REPORT   Date: 06/14/2012  EKG Time: 11:05 PM  Rate:134  Rhythm: sinus tachycardia,  normal sinus rhythm at 93  Axis: borderline  Intervals:none  ST&T Change: borderline T wave abn anterior leads  Narrative Interpretation: borderline             MDM  Will admit for IV therapy, antibiotics          Arman Filter, NP 06/14/12 2305

## 2012-06-14 NOTE — H&P (Signed)
[facility] History and Physical  [name] NWG:[id number] DOB: Feb 15, 1950 DOA: 06/14/2012  Referring physician: ER  PCP: [name], MD   Chief Complaint: Fever  HPI:  62 year old male with history of non-small cell squamous cell carcinoma currently undergoing chemotherapy under the care of Dr. [name] who presents to the ER because of fever of 102F and is found to have a lingular pneumonia. Patient received his chemotherapy yesterday. He denies any chills rigors nausea vomiting diarrhea. Patient also complained of dizziness and was found to be hypotensive in the ED with systolic blood pressure in the 70s      Review of Systems: negative for the following  Constitutional: Denies fever, chills, diaphoresis, appetite change and fatigue.  HEENT: Denies photophobia, eye pain, redness, hearing loss, ear pain, congestion, sore throat, rhinorrhea, sneezing, mouth sores, trouble swallowing, neck pain, neck stiffness and tinnitus.  Respiratory: Denies SOB, DOE, cough, chest tightness, and wheezing.  Cardiovascular: Denies chest pain, palpitations and leg swelling.  Gastrointestinal: Denies nausea, vomiting, abdominal pain, diarrhea, constipation, blood in stool and abdominal distention.  Genitourinary: Denies dysuria, urgency, frequency, hematuria, flank pain and difficulty urinating.  Musculoskeletal: Denies myalgias, back pain, joint swelling, arthralgias and gait problem.  Skin: Denies pallor, rash and wound.  Neurological: Denies dizziness, seizures, syncope, weakness, light-headedness, numbness and headaches.  Hematological: Denies adenopathy. Easy bruising, personal or family bleeding history  Psychiatric/Behavioral: Denies suicidal ideation, mood changes, confusion, nervousness, sleep disturbance and agitation       Past Medical History  Diagnosis Date  . BENIGN PROSTATIC HYPERTROPHY 12/24/2009  . CEREBROVASCULAR ACCIDENT, HX OF 12/24/2009  . HYPERLIPIDEMIA  12/24/2009  . HYPERTENSION 12/24/2009  . Testosterone deficiency   . ED (erectile dysfunction)   . Claudication in peripheral vascular disease, lifestyle limiting, with abnormal arterial dopplers of lower ext. 06/30/2011  . S/P angioplasty with stent to Lt. SFA 06/30/11 06/30/2011  . COPD 12/24/2009  . Shortness of breath     "from the COPD" (10/13/2011)  . Stroke 1996    "right side not as strong as left since; numbness R leg/foot" (10/13/2011)  . Pernicious anemia      Past Surgical History  Procedure Laterality Date  . Cervical fusion  1980's    "total of 3-4 OR's on my neck; pinched nerve" (10/13/2011)  . Aneurysm coiling  2007  . Peripheral arterial stent graft  06/30/2010; 10/13/10    left; left  . Video bronchoscopy Bilateral 04/03/2012    Procedure: VIDEO BRONCHOSCOPY WITHOUT FLUORO;  Surgeon: Nyoka Cowden, MD;  Location: [facility];  Service: Cardiopulmonary;  Laterality: Bilateral;      Social History:  reports that he has been smoking Cigarettes.  He has a 45 pack-year smoking history. He has never used smokeless tobacco. He reports that he does not drink alcohol or use illicit drugs.    No Known Allergies  Family History  Problem Relation Age of Onset  . Emphysema Father     smoked  . Emphysema Paternal Uncle     smoked  . Heart disease Mother   . Lung cancer Father     smoked  . Lung cancer Paternal Grandfather     smoked     Prior to Admission medications   Medication Sig Start Date End Date Taking? Authorizing Provider  acetaminophen (TYLENOL) 500 MG tablet Take 1,000 mg by mouth every 6 (six) hours as needed. For pain   Yes Historical Provider, MD  aspirin 81 MG tablet Take 81 mg by mouth  daily.   Yes Historical Provider, MD  atorvastatin (LIPITOR) 20 MG tablet Take 20 mg by mouth daily.   Yes Historical Provider, MD  budesonide-formoterol (SYMBICORT) 160-4.5 MCG/ACT inhaler Inhale 2 puffs into the lungs 2 (two) times daily. 06/05/12  Yes Gordy Savers, MD  clopidogrel (PLAVIX) 75 MG tablet Take 75 mg by mouth daily. 01/12/12  Yes Historical Provider, MD  gabapentin (NEURONTIN) 600 MG tablet Take 600 mg by mouth every morning.    Yes Historical Provider, MD  lisinopril (PRINIVIL,ZESTRIL) 20 MG tablet Take 20 mg by mouth daily.   Yes Historical Provider, MD  nortriptyline (PAMELOR) 75 MG capsule Take 75 mg by mouth at bedtime.   Yes Historical Provider, MD  prochlorperazine (COMPAZINE) 10 MG tablet Take 1 tablet (10 mg total) by mouth every 6 (six) hours as needed. 04/16/12  Yes Si Gaul, MD  tamsulosin (FLOMAX) 0.4 MG CAPS Take by mouth.   Yes Historical Provider, MD     Physical Exam: Filed Vitals:   06/14/12 2026 06/14/12 2039 06/14/12 2040 06/14/12 2148  BP: 100/60 100/60 82/46 98/73   Pulse: 136 135 143 133  Temp: 100.7 F (38.2 C)     TempSrc: Oral     Resp: 24   22  SpO2: 95%   97%     Constitutional: Vital signs reviewed.General appearance: alert, cooperative, fatigued and no distress Head: Normocephalic and atraumatic  Ear: TM normal bilaterally  Mouth: no erythema or exudates, MMM  Eyes: PERRL, EOMI, conjunctivae normal, No scleral icterus.  Neck: Supple, Trachea midline normal ROM, No JVD, mass, thyromegaly, or carotid bruit present.  Cardiovascular: RRR, S1 normal, S2 normal, no MRG, pulses symmetric and intact bilaterally  Pulmonary/Chest: CTAB, no wheezes, rales, or rhonchi  Abdominal: Soft. Non-tender, non-distended, bowel sounds are normal, no masses, organomegaly, or guarding present.  GU: no CVA tenderness Musculoskeletal: No joint deformities, erythema, or stiffness, ROM full and no nontender Ext: no edema and no cyanosis, pulses palpable bilaterally (DP and PT)  Hematology: no cervical, inginal, or axillary adenopathy.  Neurological: A&O x3, Strenght is normal and symmetric bilaterally, cranial nerve II-XII are grossly intact, no focal motor deficit, sensory intact to light touch bilaterally.   Skin: Warm, dry and intact. No rash, cyanosis, or clubbing.  Psychiatric: Normal mood and affect. speech and behavior is normal. Judgment and thought content normal. Cognition and memory are normal.       Labs on Admission:    Basic Metabolic Panel:  Recent Labs Lab 06/13/12 1300 06/14/12 2115  NA 132* 127*  K 3.3* 4.0  CL 97* 91*  CO2 25 26  GLUCOSE 123* 95  BUN 13.1 19  CREATININE 1.2 1.15  CALCIUM 9.0 8.7   Liver Function Tests:  Recent Labs Lab 06/13/12 1300 06/14/12 2115  AST 13 18  ALT 12 16  ALKPHOS 123 115  BILITOT 0.37 0.5  PROT 7.3 7.4  ALBUMIN 3.1* 3.1*   No results found for this basename: LIPASE, AMYLASE,  in the last 168 hours No results found for this basename: AMMONIA,  in the last 168 hours CBC:  Recent Labs Lab 06/13/12 1259 06/14/12 2115  WBC 2.5* 0.4*  NEUTROABS 1.5 0.1*  HGB 9.3* 9.3*  HCT 27.8* 27.4*  MCV 85.8 84.0  PLT 169 171   Cardiac Enzymes: No results found for this basename: CKTOTAL, CKMB, CKMBINDEX, TROPONINI,  in the last 168 hours  BNP (last 3 results) No results found for this basename: PROBNP,  in the last  8760 hours    CBG: No results found for this basename: GLUCAP,  in the last 168 hours  Radiological Exams on Admission: Dg Chest 2 View  06/14/2012   *RADIOLOGY REPORT*  Clinical Data: Fever, productive cough.  CHEST - 2 VIEW  Comparison: 03/20/2012  Findings: There is hyperinflation of the lungs compatible with COPD.   Increased density anteriorly on the lateral view which is likely within the lingula concerning for pneumonia.  No focal opacity on the right.  No effusions or acute bony abnormality.  IMPRESSION: COPD.  Lingular pneumonia.   Original Report Authenticated By: Charlett Nose, M.D.    EKG: Independently reviewed.  Assessment/Plan Principal Problem:   Neutropenic fever Active Problems:   HYPERLIPIDEMIA   HYPERTENSION   COPD   BENIGN PROSTATIC HYPERTROPHY   Neutropenia   Neutropenic  fever Secondary to pneumonia Continue vancomycin, Zosyn Given low blood pressure Will admit to step down Please call oncology in the morning  Hypertension Hold antihypertensive medications Hydrated aggressively with IV fluids   COPD Continue home nebulizer treatments  Code Status:   full Family Communication: bedside Disposition Plan: admit   Time spent: 70 mins   [facility] Pager [phone]  If 7PM-7AM, please contact night-coverage [email or web address] Password [facility] 06/14/2012, 11:23 PM

## 2012-06-15 ENCOUNTER — Other Ambulatory Visit (HOSPITAL_COMMUNITY): Payer: Self-pay | Admitting: Oncology

## 2012-06-15 DIAGNOSIS — I498 Other specified cardiac arrhythmias: Secondary | ICD-10-CM

## 2012-06-15 DIAGNOSIS — I10 Essential (primary) hypertension: Secondary | ICD-10-CM

## 2012-06-15 DIAGNOSIS — R5081 Fever presenting with conditions classified elsewhere: Secondary | ICD-10-CM

## 2012-06-15 DIAGNOSIS — A419 Sepsis, unspecified organism: Principal | ICD-10-CM

## 2012-06-15 DIAGNOSIS — D702 Other drug-induced agranulocytosis: Secondary | ICD-10-CM

## 2012-06-15 DIAGNOSIS — J449 Chronic obstructive pulmonary disease, unspecified: Secondary | ICD-10-CM

## 2012-06-15 DIAGNOSIS — R000 Tachycardia, unspecified: Secondary | ICD-10-CM

## 2012-06-15 DIAGNOSIS — C3490 Malignant neoplasm of unspecified part of unspecified bronchus or lung: Secondary | ICD-10-CM

## 2012-06-15 DIAGNOSIS — R918 Other nonspecific abnormal finding of lung field: Secondary | ICD-10-CM

## 2012-06-15 LAB — URINALYSIS, ROUTINE W REFLEX MICROSCOPIC
Ketones, ur: NEGATIVE mg/dL
Leukocytes, UA: NEGATIVE
Nitrite: NEGATIVE
Protein, ur: NEGATIVE mg/dL
Urobilinogen, UA: 0.2 mg/dL (ref 0.0–1.0)

## 2012-06-15 LAB — DIFFERENTIAL
Basophils Absolute: 0 10*3/uL (ref 0.0–0.1)
Basophils Relative: 0 % (ref 0–1)
Eosinophils Absolute: 0 10*3/uL (ref 0.0–0.7)
Lymphs Abs: 0.2 10*3/uL — ABNORMAL LOW (ref 0.7–4.0)
Neutro Abs: 0 10*3/uL — ABNORMAL LOW (ref 1.7–7.7)

## 2012-06-15 LAB — BASIC METABOLIC PANEL
BUN: 15 mg/dL (ref 6–23)
BUN: 14 mg/dL (ref 6–23)
CO2: 24 mEq/L (ref 19–32)
CO2: 24 mEq/L (ref 19–32)
CO2: 24 mEq/L (ref 19–32)
Calcium: 7.6 mg/dL — ABNORMAL LOW (ref 8.4–10.5)
Chloride: 96 mEq/L (ref 96–112)
Chloride: 95 mEq/L — ABNORMAL LOW (ref 96–112)
Chloride: 96 mEq/L (ref 96–112)
Creatinine, Ser: 0.93 mg/dL (ref 0.50–1.35)
Creatinine, Ser: 0.91 mg/dL (ref 0.50–1.35)
Glucose, Bld: 90 mg/dL (ref 70–99)
Glucose, Bld: 114 mg/dL — ABNORMAL HIGH (ref 70–99)
Potassium: 3.1 mEq/L — ABNORMAL LOW (ref 3.5–5.1)

## 2012-06-15 LAB — LEGIONELLA ANTIGEN, URINE: Legionella Antigen, Urine: NEGATIVE

## 2012-06-15 LAB — CBC
HCT: 23.8 % — ABNORMAL LOW (ref 39.0–52.0)
MCH: 27.7 pg (ref 26.0–34.0)
MCHC: 33.2 g/dL (ref 30.0–36.0)
MCV: 83.5 fL (ref 78.0–100.0)
RDW: 15.9 % — ABNORMAL HIGH (ref 11.5–15.5)

## 2012-06-15 LAB — EXPECTORATED SPUTUM ASSESSMENT W GRAM STAIN, RFLX TO RESP C

## 2012-06-15 MED ORDER — ONDANSETRON HCL 4 MG/2ML IJ SOLN
4.0000 mg | Freq: Four times a day (QID) | INTRAMUSCULAR | Status: DC | PRN
  Administered 2012-06-15 – 2012-06-19 (×4): 4 mg via INTRAVENOUS
  Filled 2012-06-15 (×4): qty 2

## 2012-06-15 MED ORDER — FILGRASTIM 480 MCG/1.6ML IJ SOLN
480.0000 ug | INTRAVENOUS | Status: AC
  Administered 2012-06-15: 480 ug via INTRAVENOUS
  Filled 2012-06-15: qty 1.6

## 2012-06-15 MED ORDER — BUDESONIDE-FORMOTEROL FUMARATE 160-4.5 MCG/ACT IN AERO
2.0000 | INHALATION_SPRAY | Freq: Two times a day (BID) | RESPIRATORY_TRACT | Status: DC
  Administered 2012-06-15 – 2012-06-20 (×9): 2 via RESPIRATORY_TRACT
  Filled 2012-06-15: qty 6

## 2012-06-15 MED ORDER — PIPERACILLIN-TAZOBACTAM 3.375 G IVPB
3.3750 g | Freq: Three times a day (TID) | INTRAVENOUS | Status: DC
  Administered 2012-06-15 – 2012-06-17 (×7): 3.375 g via INTRAVENOUS
  Filled 2012-06-15 (×8): qty 50

## 2012-06-15 MED ORDER — CLOPIDOGREL BISULFATE 75 MG PO TABS
75.0000 mg | ORAL_TABLET | Freq: Every day | ORAL | Status: DC
  Administered 2012-06-15 – 2012-06-20 (×6): 75 mg via ORAL
  Filled 2012-06-15 (×6): qty 1

## 2012-06-15 MED ORDER — POTASSIUM CHLORIDE 10 MEQ/100ML IV SOLN
10.0000 meq | INTRAVENOUS | Status: AC
  Administered 2012-06-15 (×2): 10 meq via INTRAVENOUS
  Filled 2012-06-15 (×2): qty 100

## 2012-06-15 MED ORDER — ATORVASTATIN CALCIUM 20 MG PO TABS
20.0000 mg | ORAL_TABLET | Freq: Every day | ORAL | Status: DC
  Administered 2012-06-15 – 2012-06-20 (×6): 20 mg via ORAL
  Filled 2012-06-15 (×6): qty 1

## 2012-06-15 MED ORDER — VANCOMYCIN HCL IN DEXTROSE 750-5 MG/150ML-% IV SOLN
750.0000 mg | Freq: Once | INTRAVENOUS | Status: AC
  Administered 2012-06-15: 750 mg via INTRAVENOUS
  Filled 2012-06-15: qty 150

## 2012-06-15 MED ORDER — VANCOMYCIN HCL IN DEXTROSE 750-5 MG/150ML-% IV SOLN
750.0000 mg | Freq: Two times a day (BID) | INTRAVENOUS | Status: DC
  Administered 2012-06-16 (×2): 750 mg via INTRAVENOUS
  Filled 2012-06-15 (×4): qty 150

## 2012-06-15 MED ORDER — POTASSIUM CHLORIDE CRYS ER 20 MEQ PO TBCR
40.0000 meq | EXTENDED_RELEASE_TABLET | Freq: Once | ORAL | Status: AC
  Administered 2012-06-15: 40 meq via ORAL
  Filled 2012-06-15: qty 2

## 2012-06-15 NOTE — Progress Notes (Signed)
ANTIBIOTIC CONSULT NOTE - INITIAL  Pharmacy Consult for Vancomycin and Zosyn  Indication: pneumonia  No Known Allergies  Patient Measurements: Height: 5\' 6"  (167.6 cm) Weight: 142 lb (64.411 kg) IBW/kg (Calculated) : 63.8 Adjusted Body Weight:   Vital Signs: Temp: 98.5 F (36.9 C) (05/31 0225) Temp src: Oral (05/31 0225) BP: 103/50 mmHg (05/31 0500) Pulse Rate: 131 (05/31 0500) Intake/Output from previous day: 05/30 0701 - 05/31 0700 In: 866.7 [I.V.:666.7; IV Piggyback:200] Out: 275 [Urine:275] Intake/Output from this shift: Total I/O In: 866.7 [I.V.:666.7; IV Piggyback:200] Out: 275 [Urine:275]  Labs:  Recent Labs  06/13/12 1259 06/13/12 1300 06/14/12 2115  WBC 2.5*  --  0.4*  HGB 9.3*  --  9.3*  PLT 169  --  171  CREATININE  --  1.2 1.15   Estimated Creatinine Clearance: 60.1 ml/min (by C-G formula based on Cr of 1.15). No results found for this basename: VANCOTROUGH, Leodis Binet, VANCORANDOM, GENTTROUGH, GENTPEAK, GENTRANDOM, TOBRATROUGH, TOBRAPEAK, TOBRARND, AMIKACINPEAK, AMIKACINTROU, AMIKACIN,  in the last 72 hours   Microbiology: Recent Results (from the past 720 hour(s))  MRSA PCR SCREENING     Status: None   Collection Time    06/15/12  1:46 AM      Result Value Range Status   MRSA by PCR NEGATIVE  NEGATIVE Final   Comment:            The GeneXpert MRSA Assay (FDA     approved for NASAL specimens     only), is one component of a     comprehensive MRSA colonization     surveillance program. It is not     intended to diagnose MRSA     infection nor to guide or     monitor treatment for     MRSA infections.    Medical History: Past Medical History  Diagnosis Date  . BENIGN PROSTATIC HYPERTROPHY 12/24/2009  . CEREBROVASCULAR ACCIDENT, HX OF 12/24/2009  . HYPERLIPIDEMIA 12/24/2009  . HYPERTENSION 12/24/2009  . Testosterone deficiency   . ED (erectile dysfunction)   . Claudication in peripheral vascular disease, lifestyle limiting, with abnormal  arterial dopplers of lower ext. 06/30/2011  . S/P angioplasty with stent to Lt. SFA 06/30/11 06/30/2011  . COPD 12/24/2009  . Shortness of breath     "from the COPD" (10/13/2011)  . Stroke 1996    "right side not as strong as left since; numbness R leg/foot" (10/13/2011)  . Pernicious anemia     Medications:  Anti-infectives   Start     Dose/Rate Route Frequency Ordered Stop   06/15/12 1000  vancomycin (VANCOCIN) IVPB 750 mg/150 ml premix     750 mg 150 mL/hr over 60 Minutes Intravenous  Once 06/15/12 0517     06/15/12 0600  piperacillin-tazobactam (ZOSYN) IVPB 3.375 g     3.375 g 12.5 mL/hr over 240 Minutes Intravenous 3 times per day 06/15/12 0158     06/14/12 2230  vancomycin (VANCOCIN) IVPB 1000 mg/200 mL premix     1,000 mg 200 mL/hr over 60 Minutes Intravenous  Once 06/14/12 2200 06/15/12 0115   06/14/12 2130  piperacillin-tazobactam (ZOSYN) IVPB 3.375 g     3.375 g 100 mL/hr over 30 Minutes Intravenous  Once 06/14/12 2200 06/15/12 0015     Assessment: Patient with PNA.  First dose of antibiotics already given in ED.  Goal of Therapy:  Vancomycin trough level 15-20 mcg/ml Zosyn based on renal function   Plan:  Measure antibiotic drug levels at steady state Follow  up culture results Vancomycin 750mg  iv q12hr Zosyn 3.375g IV Q8H infused over 4hrs.   Darlina Guys, Jacquenette Shone Crowford 06/15/2012,5:21 AM

## 2012-06-15 NOTE — Progress Notes (Addendum)
[facility] PROGRESS NOTE  [name] OZH:[id number] DOB: 1950-02-18 DOA: 06/14/2012 PCP: Frank Boga, MD  Assessment/Plan: 1. Sepsis - Lung source. In context of patient receiving chemotherapy for squamous cell carcinoma - Addendum: Vanc and Zosyn on board. Zosyn having activity against pseudomonas therefore will continue current regimen. - Continue aggressive fluid rehydration at this point  2. PNA - Cause of # 1 most likely - As indicated above add cefepime - supportive therapy  - supplemental oxygen. - will order sputum culture, urine legionalla/strep antigen  3. Neutropenia - consulted Oncology who will provide further evaluation and recommendations - 2ary to recent chemotherapy session.  4. Sinus tachycardia - Most likely due to current infection - agree with xopenex instead of albuterol  5. COPD - compensated. Avoid steroids given above problems - continue xopenex, symbicort  Code Status: Full Family Communication: no family at bedside. Disposition Plan: Pending continued improvement in condition.   Consultants:  Oncology  Procedures:  none  Antibiotics:  Vancomycin/zosyn: on admission  Cefepime 5/31  HPI/Subjective: Patient states that he feels better. No new complaints.   Objective: Filed Vitals:   06/15/12 0430 06/15/12 0500 06/15/12 0600 06/15/12 0700  BP: 108/48 103/50 170/56 153/59  Pulse: 132 131 139 135  Temp:      TempSrc:      Resp: 27 25 25 27   Height:      Weight:      SpO2: 89% 89% 93% 95%    Intake/Output Summary (Last 24 hours) at 06/15/12 0803 Last data filed at 06/15/12 0737  Gross per 24 hour  Intake 1466.67 ml  Output    475 ml  Net 991.67 ml   Filed Weights   06/15/12 0415  Weight: 64.411 kg (142 lb)    Exam:   General:  Pt in NAD, Alert and awake  Cardiovascular: tachycardic, no murmurs  Respiratory: Rhales at L lung field, no wheezes,   Abdomen: soft, NT, ND  Musculoskeletal: no  cyanosis or clubbing  Data Reviewed: Basic Metabolic Panel:  Recent Labs Lab 06/13/12 1300 06/14/12 2115  NA 132* 127*  K 3.3* 4.0  CL 97* 91*  CO2 25 26  GLUCOSE 123* 95  BUN 13.1 19  CREATININE 1.2 1.15  CALCIUM 9.0 8.7   Liver Function Tests:  Recent Labs Lab 06/13/12 1300 06/14/12 2115  AST 13 18  ALT 12 16  ALKPHOS 123 115  BILITOT 0.37 0.5  PROT 7.3 7.4  ALBUMIN 3.1* 3.1*   No results found for this basename: LIPASE, AMYLASE,  in the last 168 hours No results found for this basename: AMMONIA,  in the last 168 hours CBC:  Recent Labs Lab 06/13/12 1259 06/14/12 2115 06/15/12 0508  WBC 2.5* 0.4* 0.2*  NEUTROABS 1.5 0.1* 0.0*  HGB 9.3* 9.3* 7.9*  HCT 27.8* 27.4* 23.8*  MCV 85.8 84.0 83.5  PLT 169 171 139*   Cardiac Enzymes:  Recent Labs Lab 06/14/12 2345 06/15/12 0508  TROPONINI <0.30 <0.30   BNP (last 3 results) No results found for this basename: PROBNP,  in the last 8760 hours CBG: No results found for this basename: GLUCAP,  in the last 168 hours  Recent Results (from the past 240 hour(s))  MRSA PCR SCREENING     Status: None   Collection Time    06/15/12  1:46 AM      Result Value Range Status   MRSA by PCR NEGATIVE  NEGATIVE Final   Comment:  The GeneXpert MRSA Assay (FDA     approved for NASAL specimens     only), is one component of a     comprehensive MRSA colonization     surveillance program. It is not     intended to diagnose MRSA     infection nor to guide or     monitor treatment for     MRSA infections.     Studies: Dg Chest 2 View  06/14/2012   *RADIOLOGY REPORT*  Clinical Data: Fever, productive cough.  CHEST - 2 VIEW  Comparison: 03/20/2012  Findings: There is hyperinflation of the lungs compatible with COPD.   Increased density anteriorly on the lateral view which is likely within the lingula concerning for pneumonia.  No focal opacity on the right.  No effusions or acute bony abnormality.  IMPRESSION:  COPD.  Lingular pneumonia.   Original Report Authenticated By: [name], M.D.    Scheduled Meds: . atorvastatin  20 mg Oral Daily  . budesonide-formoterol  2 puff Inhalation BID  . clopidogrel  75 mg Oral Daily  . enoxaparin (LOVENOX) injection  40 mg Subcutaneous QHS  . piperacillin-tazobactam (ZOSYN)  IV  3.375 g Intravenous Q8H  . sodium chloride  3 mL Intravenous Q12H  . vancomycin  750 mg Intravenous Once   Continuous Infusions: . sodium chloride 200 mL/hr at 06/15/12 0140    Principal Problem:   Sepsis Active Problems:   HYPERLIPIDEMIA   HYPERTENSION   COPD   BENIGN PROSTATIC HYPERTROPHY   Neutropenia   Neutropenic fever    Time spent: > 35 minutes    [name]  [facility] Pager [phone]. If 7PM-7AM, please contact night-coverage at [email or web address], password [facility] 06/15/2012, 8:03 AM  LOS: 1 day

## 2012-06-15 NOTE — Consult Note (Signed)
ID: [name] OB: [birth year]  Frank#: [id number]  NWG#:[id number]  PCP: [name], MD SU:  OTHER MD: Eli Hose, Si Gaul, Sandrea Hughs   HISTORY OF PRESENT ILLNESS: From Dr. Asa Lente intake note 04/16/2012  "The patient also has long history of smoking. In early March of 2014 the patient presented to his primary care physician complaining of cough, shortness of breath as well as hemoptysis.  Chest x-ray on 03/21/2012 showed abnormal appearance of the left hilum highly suspicious for hilar mass or lymphadenopathy. This was followed by CT scan of the chest with contrast on 03/26/2012 and it showed prevascular and AP window adenopathy which extends to the left hilum. AP window nodal mass measures 4.6 x 7.8 cm and narrows the left main pulmonary artery. A lymph node anterior to the right mainstem bronchus measures 6 mm. There is an irregular nodule in the left upper lobe, with ground-glass and solid components, measuring 2.2 x 1.2 cm. The patient was referred to Dr. Sherene Sires and on 04/03/2012 he underwent bronchoscopy with endobronchial biopsies of the left upper lobe as well as lavage of the left upper lobe. The final pathology (Accession #: [id number]) was consistent with squamous cell carcinoma. A PET scan on 04/12/2012 showed hypermetabolic left suprahilar mass consistent with bronchogenic carcinoma. There was also small hypermetabolic left upper lobe pulmonary nodule that could represent a primary bronchogenic carcinoma in addition to hypermetabolic prevascular lymph node on the left as well as hypermetabolic left axillary lymph node consistent with metastasis. There was evidence for metastasis within the small periportal lymph node."   The patient's subsequent history is as detailed below.  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 Dr. [name] intended for Abraxane to be given days 1, 8 and 15 of each cycle (in addition to  carboplatin day 1), but in the first 2 cycles the day 15 Abraxane dose had to be omitted because or neutropenia and the patient received filgastrim x3d. Cycle 3 started 06/06/2012 and the patient received the day 8 Abraxane 06/13/2012 with an ANC of 1.5. The next day the patient tells me he started feeling woozy. He developed a temperature of 102 and was advised by the covering MD to present to the ED. There he was found to be febrile and neutropenic, with a possible lingular pneumonia, and he was admitted through the hospitalist service, who consulted [place] today regarding his neutropenia.  REVIEW OF SYSTEMS: Today the patient tells me he is feeling a bit better. He denies headache, nausea or vomiting, or visual changes. He denies pleurisy or hemoptysis. He keeps a "wet" cough, currently productive of yellow phlegm. Denies chest pain or pressure, focal weakness, or recent change in bowel or bladder habits. Detailed ROS was otherwise non-contributory  PAST MEDICAL HISTORY: Past Medical History  Diagnosis Date  . BENIGN PROSTATIC HYPERTROPHY 12/24/2009  . CEREBROVASCULAR ACCIDENT, HX OF 12/24/2009  . HYPERLIPIDEMIA 12/24/2009  . HYPERTENSION 12/24/2009  . Testosterone deficiency   . ED (erectile dysfunction)   . Claudication in peripheral vascular disease, lifestyle limiting, with abnormal arterial dopplers of lower ext. 06/30/2011  . S/P angioplasty with stent to Lt. SFA 06/30/11 06/30/2011  . COPD 12/24/2009  . Shortness of breath     "from the COPD" (10/13/2011)  . Stroke 1996    "right side not as strong as left since; numbness R leg/foot" (10/13/2011)  . Pernicious anemia     PAST SURGICAL HISTORY: Past Surgical History  Procedure Laterality Date  .  Cervical fusion  1980's    "total of 3-4 OR's on my neck; pinched nerve" (10/13/2011)  . Aneurysm coiling  2007  . Peripheral arterial stent graft  06/30/2010; 10/13/10    left; left  . Video bronchoscopy Bilateral 04/03/2012    Procedure: VIDEO  BRONCHOSCOPY WITHOUT FLUORO;  Surgeon: Nyoka Cowden, MD;  Location: [facility];  Service: Cardiopulmonary;  Laterality: Bilateral;    FAMILY HISTORY Family History  Problem Relation Age of Onset  . Emphysema Father     smoked  . Emphysema Paternal Uncle     smoked  . Heart disease Mother   . Lung cancer Father     smoked  . Lung cancer Paternal Grandfather     smoked  The patient's father died from lung cancer and the patient tells me his father has "a dozen" siblings and they "all" had some sort of cancer, but the patient is not sure what types. The patient's mother died from CHF. [name] had 2 brothers, no sisters. One brother had cancer "in his bones and his brain."  SOCIAL HISTORY:  The patient used to work in [employer], but has been disabled since 1996 due to a stroke. At home are his wife [name], his daughter Linton Rump (not currently employed) and Amy's two children, and son Caryn Bee who is disabled secondary to phobias according to the patient. The patient has a second son, Koleen Nimrod, who lives in [place] and works 2 jobs.    HEALTH MAINTENANCE: History  Substance Use Topics  . Smoking status: Current Every Day Smoker -- 1.00 packs/day for 45 years    Types: Cigarettes  . Smokeless tobacco: Never Used  . Alcohol Use: No     Colonoscopy:  PAP:  Bone density:  Lipid panel:  No Known Allergies  Current Facility-Administered Medications  Medication Dose Route Frequency Provider Last Rate Last Dose  . 0.9 %  sodium chloride infusion   Intravenous Continuous Richarda Overlie, MD 200 mL/hr at 06/15/12 0140    . acetaminophen (TYLENOL) tablet 650 mg  650 mg Oral Q6H PRN Richarda Overlie, MD       Or  . acetaminophen (TYLENOL) suppository 650 mg  650 mg Rectal Q6H PRN Richarda Overlie, MD      . acetaminophen (TYLENOL) tablet 650 mg  650 mg Oral Q6H PRN Arman Filter, NP   650 mg at 06/14/12 2102  . atorvastatin (LIPITOR) tablet 20 mg  20 mg Oral Daily Richarda Overlie, MD      .  budesonide-formoterol (SYMBICORT) 160-4.5 MCG/ACT inhaler 2 puff  2 puff Inhalation BID Richarda Overlie, MD      . clopidogrel (PLAVIX) tablet 75 mg  75 mg Oral Daily Richarda Overlie, MD      . enoxaparin (LOVENOX) injection 40 mg  40 mg Subcutaneous QHS Richarda Overlie, MD   40 mg at 06/15/12 0139  . levalbuterol (XOPENEX) nebulizer solution 0.63 mg  0.63 mg Nebulization Q6H PRN Richarda Overlie, MD      . ondansetron (ZOFRAN) injection 4 mg  4 mg Intravenous Q6H PRN Rolan Lipa, NP   4 mg at 06/15/12 0140  . piperacillin-tazobactam (ZOSYN) IVPB 3.375 g  3.375 g Intravenous Q8H Richarda Overlie, MD   3.375 g at 06/15/12 0530  . sodium chloride 0.9 % injection 3 mL  3 mL Intravenous Q12H Richarda Overlie, MD      . vancomycin (VANCOCIN) IVPB 750 mg/150 ml premix  750 mg Intravenous Once Alison Murray, MD  OBJECTIVE: middle aged White male who appears older than stated age, examined in bed Filed Vitals:   06/15/12 0806  BP:   Pulse:   Temp: 99.5 F (37.5 C)  Resp:      Body mass index is 22.93 kg/(m^2).    ECOG FS: 2  Sclerae unicteric Oropharynx clear No cervical or supraclavicular adenopathy Lungs no rales or rhonchi, auscultated anterolaterally Heart regular rate and rhythm Abd benign Neuro: non-focal, well-oriented, friendly affect  LAB RESULTS:  CMP     Component Value Date/Time   NA 127* 06/14/2012 2115   NA 132* 06/13/2012 1300   K 4.0 06/14/2012 2115   K 3.3* 06/13/2012 1300   CL 91* 06/14/2012 2115   CL 97* 06/13/2012 1300   CO2 26 06/14/2012 2115   CO2 25 06/13/2012 1300   GLUCOSE 95 06/14/2012 2115   GLUCOSE 123* 06/13/2012 1300   BUN 19 06/14/2012 2115   BUN 13.1 06/13/2012 1300   CREATININE 1.15 06/14/2012 2115   CREATININE 1.2 06/13/2012 1300   CALCIUM 8.7 06/14/2012 2115   CALCIUM 9.0 06/13/2012 1300   PROT 7.4 06/14/2012 2115   PROT 7.3 06/13/2012 1300   ALBUMIN 3.1* 06/14/2012 2115   ALBUMIN 3.1* 06/13/2012 1300   AST 18 06/14/2012 2115   AST 13 06/13/2012 1300   ALT 16  06/14/2012 2115   ALT 12 06/13/2012 1300   ALKPHOS 115 06/14/2012 2115   ALKPHOS 123 06/13/2012 1300   BILITOT 0.5 06/14/2012 2115   BILITOT 0.37 06/13/2012 1300   GFRNONAA 66* 06/14/2012 2115   GFRAA 77* 06/14/2012 2115    I No results found for this basename: SPEP, UPEP,  kappa and lambda light chains    Lab Results  Component Value Date   WBC 0.2* 06/15/2012   NEUTROABS 0.0* 06/15/2012   HGB 7.9* 06/15/2012   HCT 23.8* 06/15/2012   MCV 83.5 06/15/2012   PLT 139* 06/15/2012    @LASTCHEMISTRY @  No results found for this basename: LABCA2    No components found with this basename: LABCA125    No results found for this basename: INR,  in the last 168 hours  Urinalysis    Component Value Date/Time   COLORURINE YELLOW 06/15/2012 0231   APPEARANCEUR CLEAR 06/15/2012 0231   LABSPEC 1.011 06/15/2012 0231   PHURINE 6.0 06/15/2012 0231   GLUCOSEU NEGATIVE 06/15/2012 0231   HGBUR NEGATIVE 06/15/2012 0231   BILIRUBINUR NEGATIVE 06/15/2012 0231   BILIRUBINUR neg 05/11/2010 1020   KETONESUR NEGATIVE 06/15/2012 0231   PROTEINUR NEGATIVE 06/15/2012 0231   UROBILINOGEN 0.2 06/15/2012 0231   UROBILINOGEN 0.2 05/11/2010 1020   NITRITE NEGATIVE 06/15/2012 0231   NITRITE neg 05/11/2010 1020   LEUKOCYTESUR NEGATIVE 06/15/2012 0231    STUDIES: Dg Chest 2 View  06/14/2012   *RADIOLOGY REPORT*  Clinical Data: Fever, productive cough.  CHEST - 2 VIEW  Comparison: 03/20/2012  Findings: There is hyperinflation of the lungs compatible with COPD.   Increased density anteriorly on the lateral view which is likely within the lingula concerning for pneumonia.  No focal opacity on the right.  No effusions or acute bony abnormality.  IMPRESSION: COPD.  Lingular pneumonia.   Original Report Authenticated By: Charlett Nose, M.D.    ASSESSMENT: 62 y.o. [name] admitted with fever, nautropenia, and a lingular infiltrate, with a history of squamous cell carcinoma of the lung, currently day 10 cycle 3 carboplatin/  Abraxane   PLAN: As discussed above, the patient has not been able to receive  his day 15 chemotherapy with his first 2 cycles because of neutropenia. He received filgastrim x 3 after cycles 1 and 2, with rapid recovery of counts. In general, adding growth factors in this setting has not been shown to improve survival, but it can shorten recovery, and I think it would be sensible for the patient to receive filgastrim x3 as he did with his first two cycles--will write orders.  Otherwise agree with excellent care patient is receiving and s[ecifically the antibiotic coverage. I will let Dr [name] (who is currently in [facility]) know of patient's admission. Will follow with you to discharge.  Lowella Dell, MD   06/15/2012 8:31 AM

## 2012-06-16 DIAGNOSIS — R197 Diarrhea, unspecified: Secondary | ICD-10-CM

## 2012-06-16 DIAGNOSIS — J189 Pneumonia, unspecified organism: Secondary | ICD-10-CM

## 2012-06-16 DIAGNOSIS — E876 Hypokalemia: Secondary | ICD-10-CM

## 2012-06-16 LAB — DIFFERENTIAL
Eosinophils Relative: 0 % (ref 0–5)
Lymphs Abs: 0.2 10*3/uL — ABNORMAL LOW (ref 0.7–4.0)
Monocytes Absolute: 0 10*3/uL — ABNORMAL LOW (ref 0.1–1.0)
Monocytes Relative: 0 % — ABNORMAL LOW (ref 3–12)
Neutro Abs: 0 10*3/uL — ABNORMAL LOW (ref 1.7–7.7)

## 2012-06-16 LAB — CBC
HCT: 24.8 % — ABNORMAL LOW (ref 39.0–52.0)
Hemoglobin: 8.7 g/dL — ABNORMAL LOW (ref 13.0–17.0)
MCH: 29.1 pg (ref 26.0–34.0)
MCHC: 35.1 g/dL (ref 30.0–36.0)
MCV: 82.9 fL (ref 78.0–100.0)
RBC: 2.99 MIL/uL — ABNORMAL LOW (ref 4.22–5.81)

## 2012-06-16 LAB — BASIC METABOLIC PANEL
BUN: 12 mg/dL (ref 6–23)
BUN: 12 mg/dL (ref 6–23)
BUN: 10 mg/dL (ref 6–23)
CO2: 24 mEq/L (ref 19–32)
CO2: 24 mEq/L (ref 19–32)
Calcium: 7.7 mg/dL — ABNORMAL LOW (ref 8.4–10.5)
Calcium: 7.8 mg/dL — ABNORMAL LOW (ref 8.4–10.5)
Calcium: 8 mg/dL — ABNORMAL LOW (ref 8.4–10.5)
Chloride: 94 mEq/L — ABNORMAL LOW (ref 96–112)
Chloride: 93 mEq/L — ABNORMAL LOW (ref 96–112)
Creatinine, Ser: 0.77 mg/dL (ref 0.50–1.35)
Creatinine, Ser: 0.75 mg/dL (ref 0.50–1.35)
Creatinine, Ser: 0.73 mg/dL (ref 0.50–1.35)
GFR calc Af Amer: >90 mL/min (ref >90)
GFR calc Af Amer: >90 mL/min (ref >90)
GFR calc Af Amer: >90 mL/min (ref >90)
GFR calc Af Amer: >90 mL/min (ref >90)
GFR calc non Af Amer: >90 mL/min (ref >90)
GFR calc non Af Amer: >90 mL/min (ref >90)
GFR calc non Af Amer: >90 mL/min (ref >90)
GFR calc non Af Amer: >90 mL/min (ref >90)
Glucose, Bld: 126 mg/dL — ABNORMAL HIGH (ref 70–99)
Glucose, Bld: 114 mg/dL — ABNORMAL HIGH (ref 70–99)
Glucose, Bld: 155 mg/dL — ABNORMAL HIGH (ref 70–99)
Potassium: 2.6 mEq/L — CL (ref 3.5–5.1)
Potassium: 2.5 mEq/L — CL (ref 3.5–5.1)
Potassium: 2.6 mEq/L — CL (ref 3.5–5.1)
Sodium: 129 mEq/L — ABNORMAL LOW (ref 135–145)
Sodium: 131 mEq/L — ABNORMAL LOW (ref 135–145)

## 2012-06-16 LAB — VANCOMYCIN, TROUGH: Vancomycin Tr: 7.6 ug/mL — ABNORMAL LOW (ref 10.0–20.0)

## 2012-06-16 LAB — MAGNESIUM
Magnesium: 1.1 mg/dL — ABNORMAL LOW (ref 1.5–2.5)
Magnesium: 1.7 mg/dL (ref 1.5–2.5)

## 2012-06-16 LAB — CLOSTRIDIUM DIFFICILE BY PCR: Toxigenic C. Difficile by PCR: NEGATIVE

## 2012-06-16 LAB — EXPECTORATED SPUTUM ASSESSMENT W GRAM STAIN, RFLX TO RESP C

## 2012-06-16 MED ORDER — MAGNESIUM SULFATE 40 MG/ML IJ SOLN
2.0000 g | Freq: Once | INTRAMUSCULAR | Status: AC
  Administered 2012-06-16: 2 g via INTRAVENOUS
  Filled 2012-06-16: qty 50

## 2012-06-16 MED ORDER — SODIUM CHLORIDE 0.9 % IV SOLN
INTRAVENOUS | Status: DC
  Administered 2012-06-16 (×2): via INTRAVENOUS

## 2012-06-16 MED ORDER — FILGRASTIM 480 MCG/1.6ML IJ SOLN
480.0000 ug | Freq: Every day | INTRAMUSCULAR | Status: AC
  Administered 2012-06-16 – 2012-06-17 (×2): 480 ug via SUBCUTANEOUS
  Filled 2012-06-16 (×2): qty 1.6

## 2012-06-16 MED ORDER — VANCOMYCIN HCL 10 G IV SOLR
1250.0000 mg | Freq: Two times a day (BID) | INTRAVENOUS | Status: DC
  Administered 2012-06-17 – 2012-06-19 (×6): 1250 mg via INTRAVENOUS
  Filled 2012-06-16 (×6): qty 1250

## 2012-06-16 MED ORDER — MAGNESIUM SULFATE IN D5W 10-5 MG/ML-% IV SOLN
1.0000 g | Freq: Once | INTRAVENOUS | Status: AC
  Administered 2012-06-16: 1 g via INTRAVENOUS
  Filled 2012-06-16: qty 100

## 2012-06-16 MED ORDER — POTASSIUM CHLORIDE 10 MEQ/100ML IV SOLN
10.0000 meq | INTRAVENOUS | Status: AC
  Administered 2012-06-16 (×4): 10 meq via INTRAVENOUS
  Filled 2012-06-16 (×4): qty 100

## 2012-06-16 MED ORDER — POTASSIUM CHLORIDE CRYS ER 20 MEQ PO TBCR
40.0000 meq | EXTENDED_RELEASE_TABLET | Freq: Once | ORAL | Status: AC
  Administered 2012-06-16: 40 meq via ORAL
  Filled 2012-06-16: qty 2

## 2012-06-16 MED ORDER — PSYLLIUM 95 % PO PACK
1.0000 | PACK | Freq: Every day | ORAL | Status: DC
  Administered 2012-06-16 – 2012-06-18 (×3): 1 via ORAL
  Filled 2012-06-16 (×5): qty 1

## 2012-06-16 MED ORDER — POTASSIUM CHLORIDE 10 MEQ/100ML IV SOLN
10.0000 meq | INTRAVENOUS | Status: AC
  Administered 2012-06-16 (×3): 10 meq via INTRAVENOUS
  Filled 2012-06-16 (×4): qty 100

## 2012-06-16 MED ORDER — METRONIDAZOLE 500 MG PO TABS
500.0000 mg | ORAL_TABLET | Freq: Three times a day (TID) | ORAL | Status: DC
  Administered 2012-06-16 – 2012-06-19 (×9): 500 mg via ORAL
  Filled 2012-06-16 (×12): qty 1

## 2012-06-16 NOTE — Progress Notes (Signed)
Pt's baseline is ST, HR is in the 120's.  Pt's HR jumped up into the low 160's when pt was up OOB but did not sustain.  HR drops back down into the 120's.  Pt in NAD.  Will continue to monitor pt throughout the night.

## 2012-06-16 NOTE — Progress Notes (Signed)
[name]   DOB:12-05-1950   RU#:[id number]   BJY#:[id number]  Subjective: "I feel much better." Developed severe diarrhea overnight, with incontinence x1. No appetite, but no N or V. A detailed ROS was otherwise stable. Wife in room   Objective: middle aged White male examined in bed Filed Vitals:   06/16/12 0609  BP: 124/60  Pulse: 126  Temp: 99.3 F (37.4 C)  Resp: 20    Body mass index is 22.93 kg/(m^2).  Intake/Output Summary (Last 24 hours) at 06/16/12 1036 Last data filed at 06/16/12 0552  Gross per 24 hour  Intake    650 ml  Output    852 ml  Net   -202 ml     Sclerae unicteric  Oropharynx clear  No cervical or supraclavicular adenopathy  Lungs clear -- no rales or rhonchi  Heart regular rate and rhythm  Abdomen soft, NT, +BS  Neuro nonfocal   CBG (last 3)  No results found for this basename: GLUCAP,  in the last 72 hours   Labs:  Lab Results  Component Value Date   WBC 0.2* 06/16/2012   HGB 8.7* 06/16/2012   HCT 24.8* 06/16/2012   MCV 82.9 06/16/2012   PLT 162 06/16/2012   NEUTROABS 0.0* 06/16/2012    @LASTCHEMISTRY @  Urine Studies No results found for this basename: UACOL, UAPR, USPG, UPH, UTP, UGL, UKET, UBIL, UHGB, UNIT, UROB, ULEU, UEPI, UWBC, URBC, UBAC, CAST, CRYS, UCOM, BILUA,  in the last 72 hours  Basic Metabolic Panel:  Recent Labs Lab 06/15/12 1151 06/15/12 1655 06/16/12 0103 06/16/12 0505 06/16/12 0824  NA 131* 132* 130* 132* 129*  K 2.8* 2.6* 2.6* 2.5* 2.5*  CL 95* 96 94* 94* 93*  CO2 24 24 24 24 24   GLUCOSE 95 114* 126* 114* 130*  BUN 14 14 12 12 12   CREATININE 0.93 0.91 0.82 0.77 0.75  CALCIUM 7.6* 7.6* 7.7* 7.9* 7.8*  MG  --   --   --  1.1*  --    GFR Estimated Creatinine Clearance: 86.4 ml/min (by C-G formula based on Cr of 0.75). Liver Function Tests:  Recent Labs Lab 06/13/12 1300 06/14/12 2115  AST 13 18  ALT 12 16  ALKPHOS 123 115  BILITOT 0.37 0.5  PROT 7.3 7.4  ALBUMIN 3.1* 3.1*   No results found for this  basename: LIPASE, AMYLASE,  in the last 168 hours No results found for this basename: AMMONIA,  in the last 168 hours Coagulation profile No results found for this basename: INR, PROTIME,  in the last 168 hours  CBC:  Recent Labs Lab 06/13/12 1259 06/14/12 2115 06/15/12 0508 06/16/12 0505  WBC 2.5* 0.4* 0.2* 0.2*  NEUTROABS 1.5 0.1* 0.0* 0.0*  HGB 9.3* 9.3* 7.9* 8.7*  HCT 27.8* 27.4* 23.8* 24.8*  MCV 85.8 84.0 83.5 82.9  PLT 169 171 139* 162   Cardiac Enzymes:  Recent Labs Lab 06/14/12 2345 06/15/12 0508 06/15/12 1151  TROPONINI <0.30 <0.30 <0.30   BNP: No components found with this basename: POCBNP,  CBG: No results found for this basename: GLUCAP,  in the last 168 hours D-Dimer No results found for this basename: DDIMER,  in the last 72 hours Hgb A1c No results found for this basename: HGBA1C,  in the last 72 hours Lipid Profile No results found for this basename: CHOL, HDL, LDLCALC, TRIG, CHOLHDL, LDLDIRECT,  in the last 72 hours Thyroid function studies  Recent Labs  06/14/12 2345  TSH 0.235*   Anemia  work up No results found for this basename: VITAMINB12, FOLATE, FERRITIN, TIBC, IRON, RETICCTPCT,  in the last 72 hours Microbiology Recent Results (from the past 240 hour(s))  MRSA PCR SCREENING     Status: None   Collection Time    06/15/12  1:46 AM      Result Value Range Status   MRSA by PCR NEGATIVE  NEGATIVE Final   Comment:            The GeneXpert MRSA Assay (FDA     approved for NASAL specimens     only), is one component of a     comprehensive MRSA colonization     surveillance program. It is not     intended to diagnose MRSA     infection nor to guide or     monitor treatment for     MRSA infections.  CULTURE, EXPECTORATED SPUTUM-ASSESSMENT     Status: None   Collection Time    06/15/12  9:52 AM      Result Value Range Status   Specimen Description SPUTUM   Final   Special Requests Normal   Final   Sputum evaluation     Final    Value: MICROSCOPIC FINDINGS SUGGEST THAT THIS SPECIMEN IS NOT REPRESENTATIVE OF LOWER RESPIRATORY SECRETIONS. PLEASE RECOLLECT.     [name] [id number] @ 1137 BY J SCOTTON   Report Status 06/15/2012 FINAL   Final  CULTURE, EXPECTORATED SPUTUM-ASSESSMENT     Status: None   Collection Time    06/15/12  6:20 PM      Result Value Range Status   Specimen Description SPUTUM   Final   Special Requests NONE   Final   Sputum evaluation     Final   Value: MICROSCOPIC FINDINGS SUGGEST THAT THIS SPECIMEN IS NOT REPRESENTATIVE OF LOWER RESPIRATORY SECRETIONS. PLEASE RECOLLECT.     [name] [id number] @ 1905 BY J SCOTTON   Report Status 06/15/2012 FINAL   Final  CULTURE, EXPECTORATED SPUTUM-ASSESSMENT     Status: None   Collection Time    06/16/12  3:40 AM      Result Value Range Status   Specimen Description SPUTUM   Final   Special Requests NONE   Final   Sputum evaluation     Final   Value: MICROSCOPIC FINDINGS SUGGEST THAT THIS SPECIMEN IS NOT REPRESENTATIVE OF LOWER RESPIRATORY SECRETIONS. PLEASE RECOLLECT.   Report Status 06/16/2012 FINAL   Final      Studies:  Dg Chest 2 View  06/14/2012   *RADIOLOGY REPORT*  Clinical Data: Fever, productive cough.  CHEST - 2 VIEW  Comparison: 03/20/2012  Findings: There is hyperinflation of the lungs compatible with COPD.   Increased density anteriorly on the lateral view which is likely within the lingula concerning for pneumonia.  No focal opacity on the right.  No effusions or acute bony abnormality.  IMPRESSION: COPD.  Lingular pneumonia.   Original Report Authenticated By: Charlett Nose, M.D.    Assessment: 62 y.o. [name] man admitted with fever, nautropenia, and a lingular infiltrate, with a history of squamous cell carcinoma of the lung,  Plan: currently day 11 cycle 3 carboplatin/ Abraxane, day 2 filgastrim. Clinically he is doing much better, though he remains profoundly neutropenic. Has developed diarrhea, with c diff  ordered--will start empiric flagyl empirically pending reults.  Dr. [name] will be back 06/03. I will follow with you until his return.   Lowella Dell, MD 06/16/2012  10:36 AM

## 2012-06-16 NOTE — Progress Notes (Signed)
ANTIBIOTIC CONSULT NOTE - FOLLOW UP  Pharmacy Consult for Vancomycin and Zosyn Indication: rule out pneumonia  No Known Allergies  Patient Measurements: Height: 5\' 6"  (167.6 cm) Weight: 142 lb (64.411 kg) IBW/kg (Calculated) : 63.8  Vital Signs: Temp: 99.3 F (37.4 C) (06/01 0609) Temp src: Oral (06/01 0609) BP: 124/60 mmHg (06/01 0609) Pulse Rate: 126 (06/01 0609)  Labs:  Recent Labs  06/14/12 2115 06/15/12 0508  06/16/12 0103 06/16/12 0505 06/16/12 0824  WBC 0.4* 0.2*  --   --  0.2*  --   HGB 9.3* 7.9*  --   --  8.7*  --   PLT 171 139*  --   --  162  --   CREATININE 1.15  --   < > 0.82 0.77 0.75  < > = values in this interval not displayed. Estimated Creatinine Clearance: 86.4 ml/min (by C-G formula based on Cr of 0.75). No results found for this basename: VANCOTROUGH, Leodis Binet, VANCORANDOM, GENTTROUGH, GENTPEAK, GENTRANDOM, TOBRATROUGH, TOBRAPEAK, TOBRARND, AMIKACINPEAK, AMIKACINTROU, AMIKACIN,  in the last 72 hours    Anti-infectives   Start     Dose/Rate Route Frequency Ordered Stop   06/16/12 1400  metroNIDAZOLE (FLAGYL) tablet 500 mg     500 mg Oral 3 times per day 06/16/12 1053     06/15/12 2200  vancomycin (VANCOCIN) IVPB 750 mg/150 ml premix     750 mg 150 mL/hr over 60 Minutes Intravenous Every 12 hours 06/15/12 1344     06/15/12 1000  vancomycin (VANCOCIN) IVPB 750 mg/150 ml premix     750 mg 150 mL/hr over 60 Minutes Intravenous  Once 06/15/12 0517 06/15/12 1120   06/15/12 0600  piperacillin-tazobactam (ZOSYN) IVPB 3.375 g     3.375 g 12.5 mL/hr over 240 Minutes Intravenous 3 times per day 06/15/12 0158     06/14/12 2230  vancomycin (VANCOCIN) IVPB 1000 mg/200 mL premix     1,000 mg 200 mL/hr over 60 Minutes Intravenous  Once 06/14/12 2200 06/15/12 0115   06/14/12 2130  piperacillin-tazobactam (ZOSYN) IVPB 3.375 g     3.375 g 100 mL/hr over 30 Minutes Intravenous  Once 06/14/12 2200 06/15/12 0015     Assessment: 62 yo M with h/o NSC squamous  cell lung cancer currently undergoing chemotherapy. Presented to ED with fever and pna. Most recent chemo was on 5/29 (D#8 of cycle 3).  Pharmacy asked to dose Vancomycin and Zosyn.  Day #3 Vancomycin and Zosyn.  Flagyl also added today for r/o Cdiff.  SCr stable, CRl ~ 86 ml/min  Blood and urine cultures in process.  Pt remains neutropenic with WBC 0.2 and ANC 0.  Filgrastim ordered daily x3 doses starting on 5/31.  Goal of Therapy:  Vancomycin trough level 15-20 mcg/ml  Plan:   Continue Zosyn 3.375g IV Q8H infused over 4hrs.  Continue Vancomycin 750mg  IV q12h.  Measure Vanc trough at steady state.  Follow up renal fxn and culture results.   Lynann Beaver PharmD, BCPS Pager [phone] 06/16/2012 11:45 AM

## 2012-06-16 NOTE — Progress Notes (Signed)
CRITICAL VALUE ALERT  Critical value received:  Potassium 2.6  Date of notification:  06/16/12  Time of notification:  1715  Critical value read back:yes  Nurse who received alert:  Murrell Redden, RN  MD notified (1st page):  Dr. Cena Benton  Time of first page:  1715  MD notified (2nd page):  Time of second page:  Responding MD:  Dr. Cena Benton  Time MD responded:  [phone]

## 2012-06-16 NOTE — Progress Notes (Signed)
CRITICAL VALUE ALERT  Critical value received:  K+ 2.6  Date of notification:  06-16-2012  Time of notification:  0200am   Critical value read back:yes  Nurse who received alert:  C. Gerilyn Pilgrim, RN  MD notified (1st page):  Benedetto Coons, NP  Time of first page:  0205am  MD notified (2nd page):  Time of second page:  Responding MD:  Benedetto Coons, NP  Time MD responded:  [phone]

## 2012-06-16 NOTE — Progress Notes (Signed)
CRITICAL VALUE ALERT  Critical value received:  Potassium 2.5  Date of notification:  06/16/12  Time of notification:  0824  Critical value read back:yes  Nurse who received alert:  Murrell Redden, RN  MD notified (1st page):  Dr. Cena Benton  Time of first page:  0  MD notified (2nd page):  Time of second page:  Responding MD:  Dr. Cena Benton  Time MD responded:  0

## 2012-06-16 NOTE — Progress Notes (Addendum)
[facility] PROGRESS NOTE  [name] ZOX:[id number] DOB: 1950/12/03 DOA: 06/14/2012 PCP: [name], MD  Assessment/Plan: 1. Sepsis - Lung source. In context of patient receiving chemotherapy for squamous cell carcinoma -  Vanc and Zosyn on board. Zosyn having activity against pseudomonas therefore will continue current regimen. - Improving on broad spectrum antibiotics and IVF's  2. PNA - Cause of # 1 most likely - As indicated above add cefepime - supportive therapy  - supplemental oxygen. - will order sputum culture, urine legionalla/strep antigen  3. Neutropenia - consulted Oncology who will provide further evaluation and recommendations - 2ary to recent chemotherapy session.  4. Sinus tachycardia - Most likely due to current infection - agree with xopenex instead of albuterol  5. COPD - compensated. Avoid steroids given above problems - continue xopenex, symbicort  6. Sinus tachycardia - multifactorial given active infection and most likely suspect intravascular depletion - Magnesium is low and will plan on replacing  - Replace potassium - monitor on telemetry  7. Hypokalemia - Replace orally and IV - Magnesium level low  8. Hypomagnesemia - Replace IV Addendum: Hypomagnesemia and hypokalemia likely due to history of recent diarrhea.  Will administer metamucil (fiber supplement).  Also will replace IV.  In the evening recheck potassium and magnesium levels. If still low will replace again.  Code Status: Full Family Communication: no family at bedside. Disposition Plan: Pending continued improvement in condition.   Consultants:  Oncology  Procedures:  none  Antibiotics:  Vancomycin/zosyn: on admission  Cefepime 5/31  HPI/Subjective: No new complaints. No acute issues overnight.  He denies any chest pain or shortness of breath  Objective: Filed Vitals:   06/15/12 2032 06/15/12 2100 06/15/12 2200 06/16/12 0609  BP:   119/57 98/71 124/60  Pulse: 118 118  126  Temp:   99 F (37.2 C) 99.3 F (37.4 C)  TempSrc:   Oral Oral  Resp: 26 24 18 20   Height:   5\' 6"  (1.676 m)   Weight:      SpO2: 94% 93% 95% 90%    Intake/Output Summary (Last 24 hours) at 06/16/12 1412 Last data filed at 06/16/12 0552  Gross per 24 hour  Intake    650 ml  Output    852 ml  Net   -202 ml   Filed Weights   06/15/12 0415  Weight: 64.411 kg (142 lb)    Exam:   General:  Pt in NAD, Alert and awake  Cardiovascular: tachycardic, no murmurs  Respiratory: Rhales at L lung field, no wheezes,   Abdomen: soft, NT, ND  Musculoskeletal: no cyanosis or clubbing  Data Reviewed: Basic Metabolic Panel:  Recent Labs Lab 06/15/12 1151 06/15/12 1655 06/16/12 0103 06/16/12 0505 06/16/12 0824  NA 131* 132* 130* 132* 129*  K 2.8* 2.6* 2.6* 2.5* 2.5*  CL 95* 96 94* 94* 93*  CO2 24 24 24 24 24   GLUCOSE 95 114* 126* 114* 130*  BUN 14 14 12 12 12   CREATININE 0.93 0.91 0.82 0.77 0.75  CALCIUM 7.6* 7.6* 7.7* 7.9* 7.8*  MG  --   --   --  1.1*  --    Liver Function Tests:  Recent Labs Lab 06/13/12 1300 06/14/12 2115  AST 13 18  ALT 12 16  ALKPHOS 123 115  BILITOT 0.37 0.5  PROT 7.3 7.4  ALBUMIN 3.1* 3.1*   No results found for this basename: LIPASE, AMYLASE,  in the last 168 hours No results found for this basename:  AMMONIA,  in the last 168 hours CBC:  Recent Labs Lab 06/13/12 1259 06/14/12 2115 06/15/12 0508 06/16/12 0505  WBC 2.5* 0.4* 0.2* 0.2*  NEUTROABS 1.5 0.1* 0.0* 0.0*  HGB 9.3* 9.3* 7.9* 8.7*  HCT 27.8* 27.4* 23.8* 24.8*  MCV 85.8 84.0 83.5 82.9  PLT 169 171 139* 162   Cardiac Enzymes:  Recent Labs Lab 06/14/12 2345 06/15/12 0508 06/15/12 1151  TROPONINI <0.30 <0.30 <0.30   BNP (last 3 results) No results found for this basename: PROBNP,  in the last 8760 hours CBG: No results found for this basename: GLUCAP,  in the last 168 hours  Recent Results (from the past 240 hour(s))   MRSA PCR SCREENING     Status: None   Collection Time    06/15/12  1:46 AM      Result Value Range Status   MRSA by PCR NEGATIVE  NEGATIVE Final   Comment:            The GeneXpert MRSA Assay (FDA     approved for NASAL specimens     only), is one component of a     comprehensive MRSA colonization     surveillance program. It is not     intended to diagnose MRSA     infection nor to guide or     monitor treatment for     MRSA infections.  URINE CULTURE     Status: None   Collection Time    06/15/12  2:31 AM      Result Value Range Status   Specimen Description URINE, RANDOM   Final   Special Requests NONE   Final   Culture  Setup Time 06/15/2012 11:17   Final   Colony Count NO GROWTH   Final   Culture NO GROWTH   Final   Report Status 06/16/2012 FINAL   Final  CULTURE, EXPECTORATED SPUTUM-ASSESSMENT     Status: None   Collection Time    06/15/12  9:52 AM      Result Value Range Status   Specimen Description SPUTUM   Final   Special Requests Normal   Final   Sputum evaluation     Final   Value: MICROSCOPIC FINDINGS SUGGEST THAT THIS SPECIMEN IS NOT REPRESENTATIVE OF LOWER RESPIRATORY SECRETIONS. PLEASE RECOLLECT.     Marin Roberts [id number] @ 1137 BY J SCOTTON   Report Status 06/15/2012 FINAL   Final  CULTURE, EXPECTORATED SPUTUM-ASSESSMENT     Status: None   Collection Time    06/15/12  6:20 PM      Result Value Range Status   Specimen Description SPUTUM   Final   Special Requests NONE   Final   Sputum evaluation     Final   Value: MICROSCOPIC FINDINGS SUGGEST THAT THIS SPECIMEN IS NOT REPRESENTATIVE OF LOWER RESPIRATORY SECRETIONS. PLEASE RECOLLECT.     Philis Kendall [id number] @ 1905 BY J SCOTTON   Report Status 06/15/2012 FINAL   Final  CULTURE, EXPECTORATED SPUTUM-ASSESSMENT     Status: None   Collection Time    06/16/12  3:40 AM      Result Value Range Status   Specimen Description SPUTUM   Final   Special Requests NONE   Final   Sputum evaluation      Final   Value: MICROSCOPIC FINDINGS SUGGEST THAT THIS SPECIMEN IS NOT REPRESENTATIVE OF LOWER RESPIRATORY SECRETIONS. PLEASE RECOLLECT.   Report Status 06/16/2012 FINAL   Final     Studies:  Dg Chest 2 View  06/14/2012   *RADIOLOGY REPORT*  Clinical Data: Fever, productive cough.  CHEST - 2 VIEW  Comparison: 03/20/2012  Findings: There is hyperinflation of the lungs compatible with COPD.   Increased density anteriorly on the lateral view which is likely within the lingula concerning for pneumonia.  No focal opacity on the right.  No effusions or acute bony abnormality.  IMPRESSION: COPD.  Lingular pneumonia.   Original Report Authenticated By: Charlett Nose, M.D.    Scheduled Meds: . atorvastatin  20 mg Oral Daily  . budesonide-formoterol  2 puff Inhalation BID  . clopidogrel  75 mg Oral Daily  . enoxaparin (LOVENOX) injection  40 mg Subcutaneous QHS  . filgrastim (NEUPOGEN)  SQ  480 mcg Subcutaneous q1800  . metroNIDAZOLE  500 mg Oral Q8H  . piperacillin-tazobactam (ZOSYN)  IV  3.375 g Intravenous Q8H  . potassium chloride  10 mEq Intravenous Q1 Hr x 4  . sodium chloride  3 mL Intravenous Q12H  . vancomycin  750 mg Intravenous Q12H   Continuous Infusions:    Principal Problem:   Sepsis Active Problems:   HYPERLIPIDEMIA   HYPERTENSION   COPD   BENIGN PROSTATIC HYPERTROPHY   Neutropenia   Neutropenic fever   Sinus tachycardia   Hypomagnesemia   Hypokalemia    Time spent: > 35 minutes    [name]  [facility] Pager [phone]. If 7PM-7AM, please contact night-coverage at [email or web address], password [facility] 06/16/2012, 2:12 PM  LOS: 2 days

## 2012-06-16 NOTE — Progress Notes (Signed)
ANTIBIOTIC CONSULT NOTE - FOLLOW UP  Pharmacy Consult for vancomycin Indication: pneumonia  No Known Allergies  Patient Measurements: Height: 5\' 6"  (167.6 cm) Weight: 142 lb (64.411 kg) IBW/kg (Calculated) : 63.8 Adjusted Body Weight:   Vital Signs: Temp: 98.5 F (36.9 C) (06/01 2134) Temp src: Oral (06/01 2134) BP: 104/51 mmHg (06/01 2134) Pulse Rate: 106 (06/01 2134) Intake/Output from previous day: 05/31 0701 - 06/01 0700 In: 1000 [P.O.:120; I.V.:350; IV Piggyback:350] Out: 852 [Urine:850; Stool:2] Intake/Output from this shift:    Labs:  Recent Labs  06/14/12 2115 06/15/12 0508  06/16/12 0505 06/16/12 0824 06/16/12 1306 06/16/12 1622  WBC 0.4* 0.2*  --  0.2*  --   --   --   HGB 9.3* 7.9*  --  8.7*  --   --   --   PLT 171 139*  --  162  --   --   --   CREATININE 1.15  --   < > 0.77 0.75 0.74 0.73  < > = values in this interval not displayed. Estimated Creatinine Clearance: 86.4 ml/min (by C-G formula based on Cr of 0.73).  Recent Labs  06/16/12 2030  VANCOTROUGH 7.6*     Microbiology: Recent Results (from the past 720 hour(s))  CULTURE, BLOOD (ROUTINE X 2)     Status: None   Collection Time    06/14/12  9:15 PM      Result Value Range Status   Specimen Description BLOOD RIGHT ANTECUBITAL   Final   Special Requests BOTTLES DRAWN AEROBIC AND ANAEROBIC 5CC   Final   Culture  Setup Time 06/15/2012 05:21   Final   Culture     Final   Value:        BLOOD CULTURE RECEIVED NO GROWTH TO DATE CULTURE WILL BE HELD FOR 5 DAYS BEFORE ISSUING A FINAL NEGATIVE REPORT   Report Status PENDING   Incomplete  CULTURE, BLOOD (ROUTINE X 2)     Status: None   Collection Time    06/14/12  9:25 PM      Result Value Range Status   Specimen Description BLOOD LEFT ANTECUBITAL   Final   Special Requests BOTTLES DRAWN AEROBIC AND ANAEROBIC 5CC   Final   Culture  Setup Time 06/15/2012 05:21   Final   Culture     Final   Value:        BLOOD CULTURE RECEIVED NO GROWTH TO DATE  CULTURE WILL BE HELD FOR 5 DAYS BEFORE ISSUING A FINAL NEGATIVE REPORT   Report Status PENDING   Incomplete  MRSA PCR SCREENING     Status: None   Collection Time    06/15/12  1:46 AM      Result Value Range Status   MRSA by PCR NEGATIVE  NEGATIVE Final   Comment:            The GeneXpert MRSA Assay (FDA     approved for NASAL specimens     only), is one component of a     comprehensive MRSA colonization     surveillance program. It is not     intended to diagnose MRSA     infection nor to guide or     monitor treatment for     MRSA infections.  URINE CULTURE     Status: None   Collection Time    06/15/12  2:31 AM      Result Value Range Status   Specimen Description URINE, RANDOM   Final  Special Requests NONE   Final   Culture  Setup Time 06/15/2012 11:17   Final   Colony Count NO GROWTH   Final   Culture NO GROWTH   Final   Report Status 06/16/2012 FINAL   Final  CULTURE, EXPECTORATED SPUTUM-ASSESSMENT     Status: None   Collection Time    06/15/12  9:52 AM      Result Value Range Status   Specimen Description SPUTUM   Final   Special Requests Normal   Final   Sputum evaluation     Final   Value: MICROSCOPIC FINDINGS SUGGEST THAT THIS SPECIMEN IS NOT REPRESENTATIVE OF LOWER RESPIRATORY SECRETIONS. PLEASE RECOLLECT.     Marin Roberts [id number] @ 1137 BY J SCOTTON   Report Status 06/15/2012 FINAL   Final  CULTURE, EXPECTORATED SPUTUM-ASSESSMENT     Status: None   Collection Time    06/15/12  6:20 PM      Result Value Range Status   Specimen Description SPUTUM   Final   Special Requests NONE   Final   Sputum evaluation     Final   Value: MICROSCOPIC FINDINGS SUGGEST THAT THIS SPECIMEN IS NOT REPRESENTATIVE OF LOWER RESPIRATORY SECRETIONS. PLEASE RECOLLECT.     Philis Kendall [id number] @ 1905 BY J SCOTTON   Report Status 06/15/2012 FINAL   Final  CULTURE, EXPECTORATED SPUTUM-ASSESSMENT     Status: None   Collection Time    06/16/12  3:40 AM      Result Value  Range Status   Specimen Description SPUTUM   Final   Special Requests NONE   Final   Sputum evaluation     Final   Value: MICROSCOPIC FINDINGS SUGGEST THAT THIS SPECIMEN IS NOT REPRESENTATIVE OF LOWER RESPIRATORY SECRETIONS. PLEASE RECOLLECT.   Report Status 06/16/2012 FINAL   Final  CLOSTRIDIUM DIFFICILE BY PCR     Status: None   Collection Time    06/16/12  1:17 PM      Result Value Range Status   C difficile by pcr NEGATIVE  NEGATIVE Final    Anti-infectives   Start     Dose/Rate Route Frequency Ordered Stop   06/16/12 2200  vancomycin (VANCOCIN) 1,250 mg in sodium chloride 0.9 % 250 mL IVPB     1,250 mg 166.7 mL/hr over 90 Minutes Intravenous 2 times daily 06/16/12 2141     06/16/12 1400  metroNIDAZOLE (FLAGYL) tablet 500 mg     500 mg Oral 3 times per day 06/16/12 1053     06/15/12 2200  vancomycin (VANCOCIN) IVPB 750 mg/150 ml premix  Status:  Discontinued     750 mg 150 mL/hr over 60 Minutes Intravenous Every 12 hours 06/15/12 1344 06/16/12 2140   06/15/12 1000  vancomycin (VANCOCIN) IVPB 750 mg/150 ml premix     750 mg 150 mL/hr over 60 Minutes Intravenous  Once 06/15/12 0517 06/15/12 1120   06/15/12 0600  piperacillin-tazobactam (ZOSYN) IVPB 3.375 g     3.375 g 12.5 mL/hr over 240 Minutes Intravenous 3 times per day 06/15/12 0158     06/14/12 2230  vancomycin (VANCOCIN) IVPB 1000 mg/200 mL premix     1,000 mg 200 mL/hr over 60 Minutes Intravenous  Once 06/14/12 2200 06/15/12 0115   06/14/12 2130  piperacillin-tazobactam (ZOSYN) IVPB 3.375 g     3.375 g 100 mL/hr over 30 Minutes Intravenous  Once 06/14/12 2200 06/15/12 0015      Assessment: Vancomycin level low.    Goal  of Therapy:  Vancomycin trough level 15-20 mcg/ml  Plan:  Measure antibiotic drug levels at steady state Follow up culture results Increase vancomycin to 1250mg  iv q12hr  Darlina Guys, Jacquenette Shone Crowford 06/16/2012,9:50 PM

## 2012-06-16 NOTE — Progress Notes (Signed)
CRITICAL VALUE ALERT  Critical value received:  Potassium 2.5  Date of notification:  06/16/12   Time of notification:  1317  Critical value read back:yes  Nurse who received alert:  Murrell Redden, RN  MD notified (1st page): Dr. Cena Benton  Time of first page:  1435  MD notified (2nd page):  Time of second page:  Responding MD:  Dr Cena Benton  Time MD responded:

## 2012-06-17 LAB — MAGNESIUM: Magnesium: 1.5 mg/dL (ref 1.5–2.5)

## 2012-06-17 LAB — BASIC METABOLIC PANEL
BUN: 9 mg/dL (ref 6–23)
BUN: 9 mg/dL (ref 6–23)
CO2: 25 mEq/L (ref 19–32)
CO2: 27 mEq/L (ref 19–32)
Calcium: 7.8 mg/dL — ABNORMAL LOW (ref 8.4–10.5)
Calcium: 7.9 mg/dL — ABNORMAL LOW (ref 8.4–10.5)
Chloride: 91 mEq/L — ABNORMAL LOW (ref 96–112)
Chloride: 93 mEq/L — ABNORMAL LOW (ref 96–112)
Chloride: 93 mEq/L — ABNORMAL LOW (ref 96–112)
Creatinine, Ser: 0.56 mg/dL (ref 0.50–1.35)
Creatinine, Ser: 0.56 mg/dL (ref 0.50–1.35)
GFR calc Af Amer: >90 mL/min (ref >90)
GFR calc Af Amer: >90 mL/min (ref >90)
GFR calc Af Amer: >90 mL/min (ref >90)
GFR calc Af Amer: >90 mL/min (ref >90)
GFR calc non Af Amer: >90 mL/min (ref >90)
GFR calc non Af Amer: >90 mL/min (ref >90)
GFR calc non Af Amer: >90 mL/min (ref >90)
Glucose, Bld: 126 mg/dL — ABNORMAL HIGH (ref 70–99)
Glucose, Bld: 126 mg/dL — ABNORMAL HIGH (ref 70–99)
Potassium: 2.3 mEq/L — CL (ref 3.5–5.1)
Potassium: 2.3 mEq/L — CL (ref 3.5–5.1)
Potassium: 2.4 mEq/L — CL (ref 3.5–5.1)
Sodium: 129 mEq/L — ABNORMAL LOW (ref 135–145)
Sodium: 129 mEq/L — ABNORMAL LOW (ref 135–145)
Sodium: 129 mEq/L — ABNORMAL LOW (ref 135–145)

## 2012-06-17 LAB — CBC WITH DIFFERENTIAL/PLATELET
Basophils Absolute: 0 10*3/uL (ref 0.0–0.1)
Lymphocytes Relative: 86 % — ABNORMAL HIGH (ref 12–46)
Monocytes Relative: 7 % (ref 3–12)
Neutrophils Relative %: 7 % — ABNORMAL LOW (ref 43–77)
Platelets: 141 10*3/uL — ABNORMAL LOW (ref 150–400)
RDW: 15.6 % — ABNORMAL HIGH (ref 11.5–15.5)
WBC: 0.3 10*3/uL — CL (ref 4.0–10.5)

## 2012-06-17 MED ORDER — LEVOFLOXACIN IN D5W 750 MG/150ML IV SOLN
750.0000 mg | INTRAVENOUS | Status: DC
  Administered 2012-06-17 – 2012-06-18 (×2): 750 mg via INTRAVENOUS
  Filled 2012-06-17 (×3): qty 150

## 2012-06-17 MED ORDER — POTASSIUM CHLORIDE CRYS ER 20 MEQ PO TBCR
40.0000 meq | EXTENDED_RELEASE_TABLET | Freq: Three times a day (TID) | ORAL | Status: DC
  Administered 2012-06-17 (×2): 40 meq via ORAL
  Filled 2012-06-17 (×5): qty 2

## 2012-06-17 MED ORDER — MAGNESIUM OXIDE 400 (241.3 MG) MG PO TABS: 400.0000 mg | ORAL_TABLET | Freq: Two times a day (BID) | ORAL | Status: DC

## 2012-06-17 MED ORDER — MAGNESIUM OXIDE 400 (241.3 MG) MG PO TABS
400.0000 mg | ORAL_TABLET | Freq: Two times a day (BID) | ORAL | Status: DC
  Administered 2012-06-17 – 2012-06-20 (×6): 400 mg via ORAL
  Filled 2012-06-17 (×8): qty 1

## 2012-06-17 MED ORDER — POTASSIUM CHLORIDE CRYS ER 20 MEQ PO TBCR
40.0000 meq | EXTENDED_RELEASE_TABLET | Freq: Two times a day (BID) | ORAL | Status: DC
Start: 2012-06-17 — End: 2012-06-17
  Administered 2012-06-17: 40 meq via ORAL
  Filled 2012-06-17 (×2): qty 2

## 2012-06-17 MED ORDER — POTASSIUM CHLORIDE 10 MEQ/100ML IV SOLN
10.0000 meq | INTRAVENOUS | Status: DC
  Administered 2012-06-17 (×2): 10 meq via INTRAVENOUS
  Filled 2012-06-17 (×2): qty 100

## 2012-06-17 MED ORDER — ZOLPIDEM TARTRATE 5 MG PO TABS
5.0000 mg | ORAL_TABLET | Freq: Every day | ORAL | Status: DC
  Administered 2012-06-17 – 2012-06-19 (×3): 5 mg via ORAL
  Filled 2012-06-17 (×3): qty 1

## 2012-06-17 MED ORDER — POTASSIUM CHLORIDE 10 MEQ/100ML IV SOLN
10.0000 meq | INTRAVENOUS | Status: AC
  Administered 2012-06-17 (×4): 10 meq via INTRAVENOUS
  Filled 2012-06-17 (×4): qty 100

## 2012-06-17 MED ORDER — POTASSIUM CHLORIDE 10 MEQ/100ML IV SOLN
10.0000 meq | INTRAVENOUS | Status: AC
  Administered 2012-06-17 – 2012-06-18 (×3): 10 meq via INTRAVENOUS
  Filled 2012-06-17 (×3): qty 100

## 2012-06-17 MED ORDER — POTASSIUM CHLORIDE 20 MEQ/15ML (10%) PO LIQD
40.0000 meq | Freq: Once | ORAL | Status: AC
  Administered 2012-06-18: 40 meq via ORAL
  Filled 2012-06-17 (×2): qty 30

## 2012-06-17 MED ORDER — MAGNESIUM SULFATE 40 MG/ML IJ SOLN
2.0000 g | Freq: Once | INTRAMUSCULAR | Status: AC
  Administered 2012-06-17: 2 g via INTRAVENOUS
  Filled 2012-06-17: qty 50

## 2012-06-17 MED ORDER — POTASSIUM CHLORIDE 20 MEQ/15ML (10%) PO LIQD
40.0000 meq | Freq: Once | ORAL | Status: AC
  Administered 2012-06-17: 40 meq via ORAL
  Filled 2012-06-17: qty 30

## 2012-06-17 NOTE — Progress Notes (Signed)
CRITICAL VALUE ALERT  Critical value received:  K 2.3   Date of notification:  06/17/12  Time of notification:  1455  Critical value read back: yes  Nurse who received alert:  MB, RN  MD notified (1st page):  Dr. Cena Benton  Time of first page:  1458  MD notified (2nd page):  Time of second page:  Responding MD:  Dr. Cena Benton   Time MD responded:

## 2012-06-17 NOTE — Progress Notes (Signed)
INITIAL NUTRITION ASSESSMENT  DOCUMENTATION CODES Per approved criteria  -Not Applicable   INTERVENTION: Recommend liberalizing diet to regular Provide Mighty Shakes BID Encourage PO intake  NUTRITION DIAGNOSIS: Inadequate oral intake related to poor appetite as evidenced by 5% wt loss in less than 3 monts.   Goal: Pt to meet >/= 90% of their estimated nutrition needs  Monitor:  PO intake Weight Labs  Reason for Assessment: Malnutrition Screening Tool, Score of 2  62 y.o. male  Admitting Dx: Sepsis  ASSESSMENT: 63 year old male with history of non-small cell squamous cell carcinoma currently undergoing chemotherapy under the care of Dr. [name] who presents to the ER because of fever of 102F and is found to have a lingular pneumonia. Patient received his chemotherapy 5/29. Pt reports that he usually weighs 150 lbs but, chemotherapy causes him to have a very poor appetite causing pt to lose weight in the past few months. Pt states that he has no appetite today and has not eaten anything. Encouraged pt to order meals and eat as much as he can tolerate; pt made aware that snacks are available on unit.  Height: Ht Readings from Last 1 Encounters:  06/15/12 5\' 6"  (1.676 m)    Weight: Wt Readings from Last 1 Encounters:  06/15/12 142 lb (64.411 kg)    Ideal Body Weight: 142 lbs  % Ideal Body Weight: 100%  Wt Readings from Last 10 Encounters:  06/15/12 142 lb (64.411 kg)  06/06/12 142 lb 14.4 oz (64.819 kg)  06/05/12 144 lb (65.318 kg)  05/16/12 145 lb 8 oz (65.998 kg)  04/16/12 147 lb 8 oz (66.906 kg)  03/28/12 149 lb 12.8 oz (67.949 kg)  03/21/12 148 lb (67.132 kg)  03/20/12 148 lb (67.132 kg)  12/04/11 150 lb (68.04 kg)  11/21/11 150 lb (68.04 kg)    Usual Body Weight: 150 lbs  % Usual Body Weight: 95%  BMI:  Body mass index is 22.93 kg/(m^2).  Estimated Nutritional Needs: Kcal: 1610-1930 Protein: 77-90 grams Fluid: 1.9 L  Skin: intact; dry  Diet  Order: Cardiac  EDUCATION NEEDS: -No education needs identified at this time   Intake/Output Summary (Last 24 hours) at 06/17/12 1041 Last data filed at 06/17/12 0002  Gross per 24 hour  Intake 2382.5 ml  Output      0 ml  Net 2382.5 ml    Last BM: 6/2  Labs:   Recent Labs Lab 06/16/12 0505  06/16/12 1622 06/16/12 1728 06/17/12 0017 06/17/12 0843  NA 132*  < > 130*  --  130* 129*  K 2.5*  < > 2.6* 2.5* 2.3* 2.1*  CL 94*  < > 93*  --  93* 94*  CO2 24  < > 25  --  25 25  BUN 12  < > 10  --  9 9  CREATININE 0.77  < > 0.73  --  0.69 0.59  CALCIUM 7.9*  < > 7.9*  --  7.9* 7.8*  MG 1.1*  --   --  1.7  --  1.5  GLUCOSE 114*  < > 119*  --  115* 126*  < > = values in this interval not displayed.  CBG (last 3)  No results found for this basename: GLUCAP,  in the last 72 hours  Scheduled Meds: . atorvastatin  20 mg Oral Daily  . budesonide-formoterol  2 puff Inhalation BID  . clopidogrel  75 mg Oral Daily  . enoxaparin (LOVENOX) injection  40 mg Subcutaneous QHS  .  filgrastim (NEUPOGEN)  SQ  480 mcg Subcutaneous q1800  . levofloxacin (LEVAQUIN) IV  750 mg Intravenous Q24H  . magnesium sulfate 1 - 4 g bolus IVPB  2 g Intravenous Once  . metroNIDAZOLE  500 mg Oral Q8H  . potassium chloride  10 mEq Intravenous Q1 Hr x 4  . potassium chloride  40 mEq Oral TID  . psyllium  1 packet Oral Daily  . sodium chloride  3 mL Intravenous Q12H  . vancomycin  1,250 mg Intravenous BID    Continuous Infusions:   Past Medical History  Diagnosis Date  . BENIGN PROSTATIC HYPERTROPHY 12/24/2009  . CEREBROVASCULAR ACCIDENT, HX OF 12/24/2009  . HYPERLIPIDEMIA 12/24/2009  . HYPERTENSION 12/24/2009  . Testosterone deficiency   . ED (erectile dysfunction)   . Claudication in peripheral vascular disease, lifestyle limiting, with abnormal arterial dopplers of lower ext. 06/30/2011  . S/P angioplasty with stent to Lt. SFA 06/30/11 06/30/2011  . COPD 12/24/2009  . Shortness of breath     "from  the COPD" (10/13/2011)  . Stroke 1996    "right side not as strong as left since; numbness R leg/foot" (10/13/2011)  . Pernicious anemia     Past Surgical History  Procedure Laterality Date  . Cervical fusion  1980's    "total of 3-4 OR's on my neck; pinched nerve" (10/13/2011)  . Aneurysm coiling  2007  . Peripheral arterial stent graft  06/30/2010; 10/13/10    left; left  . Video bronchoscopy Bilateral 04/03/2012    Procedure: VIDEO BRONCHOSCOPY WITHOUT FLUORO;  Surgeon: Nyoka Cowden, MD;  Location: [facility];  Service: Cardiopulmonary;  Laterality: Bilateral;    Ian Malkin RD, LDN Inpatient Clinical Dietitian Pager: [phone] After Hours Pager: [phone]

## 2012-06-17 NOTE — Progress Notes (Signed)
[facility] PROGRESS NOTE  [name] ZOX:[id number] DOB: 02-21-1950 DOA: 06/14/2012 PCP: [name], MD  Assessment/Plan: 1. Sepsis - Lung source. In context of patient receiving chemotherapy for squamous cell carcinoma -  Vanc and Zosyn on board. Zosyn having activity against pseudomonas therefore will continue current regimen. - Improving on broad spectrum antibiotics and IVF's  2. PNA - Cause of # 1 most likely - As indicated above add cefepime - supportive therapy  - supplemental oxygen. - will sputum culture pending, urine legionalla/strep antigen negative  3. Neutropenia - consulted Oncology who will provide further evaluation and recommendations - 2ary to recent chemotherapy session.  4. Sinus tachycardia - Most likely due to current infection - agree with xopenex instead of albuterol  5. COPD - compensated. Avoid steroids given above problems - continue xopenex, symbicort  6. Sinus tachycardia - multifactorial given active infection and most likely suspect intravascular depletion - Magnesium is low and will plan on replacing  - Replace potassium - monitor on telemetry  7. Hypokalemia - Replace orally and IV - Magnesium level low - Recheck level  8. Hypomagnesemia - Replace IV and orally - Hypomagnesemia and hypokalemia likely due to history of recent diarrhea.  Will administer metamucil (fiber supplement).  Also will replace IV.  In the evening recheck potassium and magnesium levels. If still low will replace again.  Code Status: Full Family Communication: no family at bedside. Disposition Plan: Pending continued improvement in condition.   Consultants:  Oncology  Procedures:  none  Antibiotics:  Vancomycin/zosyn: on admission  Cefepime 5/31  HPI/Subjective: Patient is concerned about potassium levels.  No acute issues reported overnight. Patient is requesting medication to help him sleep mentions he has not been able to  sleep while in house.  Objective: Filed Vitals:   06/16/12 2101 06/16/12 2134 06/17/12 0537 06/17/12 1349  BP:  104/51 135/74 114/63  Pulse:  106 109 73  Temp:  98.5 F (36.9 C) 98.9 F (37.2 C) 98.5 F (36.9 C)  TempSrc:  Oral Oral Oral  Resp:  16 18 19   Height:      Weight:      SpO2: 99% 94% 97% 98%    Intake/Output Summary (Last 24 hours) at 06/17/12 1723 Last data filed at 06/17/12 1350  Gross per 24 hour  Intake   1520 ml  Output      8 ml  Net   1512 ml   Filed Weights   06/15/12 0415  Weight: 64.411 kg (142 lb)    Exam:   General:  Pt in NAD, Alert and awake  Cardiovascular: tachycardic, no murmurs  Respiratory: Rhales at L lung field, no wheezes,   Abdomen: soft, NT, ND  Musculoskeletal: no cyanosis or clubbing  Data Reviewed: Basic Metabolic Panel:  Recent Labs Lab 06/16/12 0103 06/16/12 0505  06/16/12 1306 06/16/12 1622 06/16/12 1728 06/17/12 0017 06/17/12 0843 06/17/12 1340  NA 130* 132*  < > 131* 130*  --  130* 129* 129*  K 2.6* 2.5*  < > 2.5* 2.6* 2.5* 2.3* 2.1* 2.3*  CL 94* 94*  < > 93* 93*  --  93* 94* 94*  CO2 24 24  < > 24 25  --  25 25 25   GLUCOSE 126* 114*  < > 155* 119*  --  115* 126* 143*  BUN 12 12  < > 11 10  --  9 9 9   CREATININE 0.82 0.77  < > 0.74 0.73  --  0.69 0.59  0.58  CALCIUM 7.7* 7.9*  < > 8.0* 7.9*  --  7.9* 7.8* 7.6*  MG  --  1.1*  --   --   --  1.7  --  1.5  --   < > = values in this interval not displayed. Liver Function Tests:  Recent Labs Lab 06/13/12 1300 06/14/12 2115  AST 13 18  ALT 12 16  ALKPHOS 123 115  BILITOT 0.37 0.5  PROT 7.3 7.4  ALBUMIN 3.1* 3.1*   No results found for this basename: LIPASE, AMYLASE,  in the last 168 hours No results found for this basename: AMMONIA,  in the last 168 hours CBC:  Recent Labs Lab 06/13/12 1259 06/14/12 2115 06/15/12 0508 06/16/12 0505 06/17/12 0843  WBC 2.5* 0.4* 0.2* 0.2* 0.3*  NEUTROABS 1.5 0.1* 0.0* 0.0* 0.0*  HGB 9.3* 9.3* 7.9* 8.7* 8.1*   HCT 27.8* 27.4* 23.8* 24.8* 22.9*  MCV 85.8 84.0 83.5 82.9 81.8  PLT 169 171 139* 162 141*   Cardiac Enzymes:  Recent Labs Lab 06/14/12 2345 06/15/12 0508 06/15/12 1151  TROPONINI <0.30 <0.30 <0.30   BNP (last 3 results) No results found for this basename: PROBNP,  in the last 8760 hours CBG: No results found for this basename: GLUCAP,  in the last 168 hours  Recent Results (from the past 240 hour(s))  CULTURE, BLOOD (ROUTINE X 2)     Status: None   Collection Time    06/14/12  9:15 PM      Result Value Range Status   Specimen Description BLOOD RIGHT ANTECUBITAL   Final   Special Requests BOTTLES DRAWN AEROBIC AND ANAEROBIC 5CC   Final   Culture  Setup Time 06/15/2012 05:21   Final   Culture     Final   Value:        BLOOD CULTURE RECEIVED NO GROWTH TO DATE CULTURE WILL BE HELD FOR 5 DAYS BEFORE ISSUING A FINAL NEGATIVE REPORT   Report Status PENDING   Incomplete  CULTURE, BLOOD (ROUTINE X 2)     Status: None   Collection Time    06/14/12  9:25 PM      Result Value Range Status   Specimen Description BLOOD LEFT ANTECUBITAL   Final   Special Requests BOTTLES DRAWN AEROBIC AND ANAEROBIC 5CC   Final   Culture  Setup Time 06/15/2012 05:21   Final   Culture     Final   Value:        BLOOD CULTURE RECEIVED NO GROWTH TO DATE CULTURE WILL BE HELD FOR 5 DAYS BEFORE ISSUING A FINAL NEGATIVE REPORT   Report Status PENDING   Incomplete  MRSA PCR SCREENING     Status: None   Collection Time    06/15/12  1:46 AM      Result Value Range Status   MRSA by PCR NEGATIVE  NEGATIVE Final   Comment:            The GeneXpert MRSA Assay (FDA     approved for NASAL specimens     only), is one component of a     comprehensive MRSA colonization     surveillance program. It is not     intended to diagnose MRSA     infection nor to guide or     monitor treatment for     MRSA infections.  URINE CULTURE     Status: None   Collection Time    06/15/12  2:31 AM  Result Value Range Status    Specimen Description URINE, RANDOM   Final   Special Requests NONE   Final   Culture  Setup Time 06/15/2012 11:17   Final   Colony Count NO GROWTH   Final   Culture NO GROWTH   Final   Report Status 06/16/2012 FINAL   Final  CULTURE, EXPECTORATED SPUTUM-ASSESSMENT     Status: None   Collection Time    06/15/12  9:52 AM      Result Value Range Status   Specimen Description SPUTUM   Final   Special Requests Normal   Final   Sputum evaluation     Final   Value: MICROSCOPIC FINDINGS SUGGEST THAT THIS SPECIMEN IS NOT REPRESENTATIVE OF LOWER RESPIRATORY SECRETIONS. PLEASE RECOLLECT.     Marin Roberts [id number] @ 1137 BY J SCOTTON   Report Status 06/15/2012 FINAL   Final  CULTURE, EXPECTORATED SPUTUM-ASSESSMENT     Status: None   Collection Time    06/15/12  6:20 PM      Result Value Range Status   Specimen Description SPUTUM   Final   Special Requests NONE   Final   Sputum evaluation     Final   Value: MICROSCOPIC FINDINGS SUGGEST THAT THIS SPECIMEN IS NOT REPRESENTATIVE OF LOWER RESPIRATORY SECRETIONS. PLEASE RECOLLECT.     Philis Kendall [id number] @ 1905 BY J SCOTTON   Report Status 06/15/2012 FINAL   Final  CULTURE, EXPECTORATED SPUTUM-ASSESSMENT     Status: None   Collection Time    06/16/12  3:40 AM      Result Value Range Status   Specimen Description SPUTUM   Final   Special Requests NONE   Final   Sputum evaluation     Final   Value: MICROSCOPIC FINDINGS SUGGEST THAT THIS SPECIMEN IS NOT REPRESENTATIVE OF LOWER RESPIRATORY SECRETIONS. PLEASE RECOLLECT.   Report Status 06/16/2012 FINAL   Final  CULTURE, RESPIRATORY (NON-EXPECTORATED)     Status: None   Collection Time    06/16/12  3:40 AM      Result Value Range Status   Specimen Description SPUTUM   Final   Special Requests NONE   Final   Gram Stain     Final   Value: NO WBC SEEN     NO SQUAMOUS EPITHELIAL CELLS SEEN     NO ORGANISMS SEEN   Culture Culture reincubated for better growth   Final   Report  Status PENDING   Incomplete  CLOSTRIDIUM DIFFICILE BY PCR     Status: None   Collection Time    06/16/12  1:17 PM      Result Value Range Status   C difficile by pcr NEGATIVE  NEGATIVE Final     Studies: No results found.  Scheduled Meds: . atorvastatin  20 mg Oral Daily  . budesonide-formoterol  2 puff Inhalation BID  . clopidogrel  75 mg Oral Daily  . enoxaparin (LOVENOX) injection  40 mg Subcutaneous QHS  . filgrastim (NEUPOGEN)  SQ  480 mcg Subcutaneous q1800  . levofloxacin (LEVAQUIN) IV  750 mg Intravenous Q24H  . metroNIDAZOLE  500 mg Oral Q8H  . potassium chloride  40 mEq Oral TID  . psyllium  1 packet Oral Daily  . sodium chloride  3 mL Intravenous Q12H  . vancomycin  1,250 mg Intravenous BID  . zolpidem  5 mg Oral QHS   Continuous Infusions:    Principal Problem:   Sepsis Active Problems:  HYPERLIPIDEMIA   HYPERTENSION   COPD   BENIGN PROSTATIC HYPERTROPHY   Neutropenia   Neutropenic fever   Sinus tachycardia   Hypomagnesemia   Hypokalemia    Time spent: > 35 minutes    [name]  [facility] Pager [phone]. If 7PM-7AM, please contact night-coverage at [email or web address], password [facility] 06/17/2012, 5:23 PM  LOS: 3 days

## 2012-06-17 NOTE — Progress Notes (Addendum)
Pt had trigeminy with PJC's around 2330.  Pt also having 3 beat runs of V. Tach throughout the night.  Pt lying in bed resting, in NAD. Notified T. Claiborne Billings, NP on call.  No new orders received at this time.  Will continue to monitor pt.

## 2012-06-17 NOTE — Progress Notes (Signed)
ANTIBIOTIC CONSULT NOTE - INITIAL  Pharmacy Consult for Levaquin Indication: Pneumonia, neutropenic fever  No Known Allergies  Patient Measurements: Height: 5\' 6"  (167.6 cm) Weight: 142 lb (64.411 kg) IBW/kg (Calculated) : 63.8  Vital Signs: Temp: 98.9 F (37.2 C) (06/02 0537) Temp src: Oral (06/02 0537) BP: 135/74 mmHg (06/02 0537) Pulse Rate: 109 (06/02 0537) Intake/Output from previous day: 06/01 0701 - 06/02 0700 In: 3232.5 [P.O.:800; I.V.:632.5; IV Piggyback:1800] Out: -  Intake/Output from this shift:    Labs:  Recent Labs  06/15/12 0508  06/16/12 0505  06/16/12 1622 06/17/12 0017 06/17/12 0843  WBC 0.2*  --  0.2*  --   --   --  0.3*  HGB 7.9*  --  8.7*  --   --   --  8.1*  PLT 139*  --  162  --   --   --  141*  CREATININE  --   < > 0.77  < > 0.73 0.69 0.59  < > = values in this interval not displayed. Estimated Creatinine Clearance: 86.4 ml/min (by C-G formula based on Cr of 0.59).  Recent Labs  06/16/12 2030  VANCOTROUGH 7.6*     Medical History: Past Medical History  Diagnosis Date  . BENIGN PROSTATIC HYPERTROPHY 12/24/2009  . CEREBROVASCULAR ACCIDENT, HX OF 12/24/2009  . HYPERLIPIDEMIA 12/24/2009  . HYPERTENSION 12/24/2009  . Testosterone deficiency   . ED (erectile dysfunction)   . Claudication in peripheral vascular disease, lifestyle limiting, with abnormal arterial dopplers of lower ext. 06/30/2011  . S/P angioplasty with stent to Lt. SFA 06/30/11 06/30/2011  . COPD 12/24/2009  . Shortness of breath     "from the COPD" (10/13/2011)  . Stroke 1996    "right side not as strong as left since; numbness R leg/foot" (10/13/2011)  . Pernicious anemia     Microbiology: 5/31 MRSA: negative 5/30 blood x2: NGTD 5/31 urine: no growth, final 5/31 & 6/1 sputum: specimens collected not representative of lower respiratory secretions 6/1 Cdiff: negative 5/31 legionella antigen, urine: negative 5/31 strep pneumo antigen, urine: negative  Assessment: 64  YOM with h/o squamous cell carcinoma of lung admitted with neutropenic fever, pulmonary source.  Patient was initiated on Vancomycin and Zosyn 5/30 for empiric treatment.  PO Flagyl was added yesterday for r/o C.diff.  Today, MD discontinuing Zosyn and starting Levaquin.  Regimen now covers atypical organisms as well.  Pt remains neutropenic, WBC 0.3, ANC 0 (filgrastim 5/31-6/2), pt not at nadir yet following chemo 5/29  Renal stable. SCr 0.59, CrCl ~ 86 CG  Tmax 99.3  Cultures have not grown anything to date.  Goal of Therapy:  doses adjusted per renal clearance  Plan:  1.  Levaquin 750 mg IV q24h. 2.  Would recommend discontinuing Flagyl since C.diff PCR (6/1) negative. 3.  Would recommend discontinuing Vancomycin (day #3) since cultures have not grown any gram+ organisms.  Clance Boll 06/17/2012,12:06 PM

## 2012-06-17 NOTE — Progress Notes (Signed)
CRITICAL VALUE ALERT  Critical value received:  K-2.1  Date of notification:  06/17/2012  Time of notification:  10:00am  Critical value read back:yes  Nurse who received alert:  Ernst Breach RN  MD notified (1st page):  Dr. Cena Benton  Time of first page:  10:02am  MD notified (2nd page):  Time of second page:  Responding MD:  Dr. Cena Benton  Time MD responded:  10:02am

## 2012-06-17 NOTE — Progress Notes (Signed)
CRITICAL VALUE ALERT  Critical value received:  K+ 2.3  Date of notification:  06-17-2012  Time of notification:  0100am  Critical value read back:yes  Nurse who received alert:  C. Gerilyn Pilgrim, RN   MD notified (1st page):  Benedetto Coons, NP  Time of first page:  0110am  MD notified (2nd page):  Time of second page:  Responding MD:  Benedetto Coons, NP  Time MD responded:  [phone]

## 2012-06-18 DIAGNOSIS — C778 Secondary and unspecified malignant neoplasm of lymph nodes of multiple regions: Secondary | ICD-10-CM

## 2012-06-18 DIAGNOSIS — C3410 Malignant neoplasm of upper lobe, unspecified bronchus or lung: Secondary | ICD-10-CM

## 2012-06-18 LAB — BASIC METABOLIC PANEL
BUN: 8 mg/dL (ref 6–23)
CO2: 25 mEq/L (ref 19–32)
CO2: 27 mEq/L (ref 19–32)
CO2: 26 mEq/L (ref 19–32)
CO2: 27 mEq/L (ref 19–32)
Calcium: 7.7 mg/dL — ABNORMAL LOW (ref 8.4–10.5)
Calcium: 7.8 mg/dL — ABNORMAL LOW (ref 8.4–10.5)
Calcium: 7.9 mg/dL — ABNORMAL LOW (ref 8.4–10.5)
Calcium: 8.3 mg/dL — ABNORMAL LOW (ref 8.4–10.5)
Chloride: 93 mEq/L — ABNORMAL LOW (ref 96–112)
Chloride: 93 mEq/L — ABNORMAL LOW (ref 96–112)
Chloride: 93 mEq/L — ABNORMAL LOW (ref 96–112)
Chloride: 93 mEq/L — ABNORMAL LOW (ref 96–112)
Chloride: 94 mEq/L — ABNORMAL LOW (ref 96–112)
Creatinine, Ser: 0.55 mg/dL (ref 0.50–1.35)
Creatinine, Ser: 0.55 mg/dL (ref 0.50–1.35)
GFR calc Af Amer: >90 mL/min (ref >90)
GFR calc Af Amer: >90 mL/min (ref >90)
GFR calc Af Amer: >90 mL/min (ref >90)
GFR calc Af Amer: >90 mL/min (ref >90)
GFR calc Af Amer: >90 mL/min (ref >90)
GFR calc non Af Amer: >90 mL/min (ref >90)
GFR calc non Af Amer: >90 mL/min (ref >90)
GFR calc non Af Amer: >90 mL/min (ref >90)
Glucose, Bld: 111 mg/dL — ABNORMAL HIGH (ref 70–99)
Potassium: 2.7 mEq/L — CL (ref 3.5–5.1)
Potassium: 3.4 mEq/L — ABNORMAL LOW (ref 3.5–5.1)
Potassium: 3.4 mEq/L — ABNORMAL LOW (ref 3.5–5.1)
Sodium: 127 mEq/L — ABNORMAL LOW (ref 135–145)
Sodium: 130 mEq/L — ABNORMAL LOW (ref 135–145)
Sodium: 128 mEq/L — ABNORMAL LOW (ref 135–145)
Sodium: 128 mEq/L — ABNORMAL LOW (ref 135–145)

## 2012-06-18 LAB — CBC WITH DIFFERENTIAL/PLATELET
Basophils Absolute: 0 10*3/uL (ref 0.0–0.1)
Eosinophils Relative: 0 % (ref 0–5)
Lymphocytes Relative: 75 % — ABNORMAL HIGH (ref 12–46)
Monocytes Relative: 15 % — ABNORMAL HIGH (ref 3–12)
Neutrophils Relative %: 10 % — ABNORMAL LOW (ref 43–77)
Platelets: 137 10*3/uL — ABNORMAL LOW (ref 150–400)
RBC: 2.83 MIL/uL — ABNORMAL LOW (ref 4.22–5.81)
WBC: 0.4 10*3/uL — CL (ref 4.0–10.5)

## 2012-06-18 LAB — CULTURE, RESPIRATORY W GRAM STAIN

## 2012-06-18 LAB — MAGNESIUM: Magnesium: 1.2 mg/dL — ABNORMAL LOW (ref 1.5–2.5)

## 2012-06-18 MED ORDER — POTASSIUM CHLORIDE 20 MEQ/15ML (10%) PO LIQD
40.0000 meq | Freq: Three times a day (TID) | ORAL | Status: DC
  Administered 2012-06-18 (×3): 40 meq via ORAL
  Filled 2012-06-18 (×6): qty 30

## 2012-06-18 MED ORDER — FILGRASTIM 480 MCG/1.6ML IJ SOLN
480.0000 ug | Freq: Every day | INTRAMUSCULAR | Status: DC
  Administered 2012-06-18 – 2012-06-19 (×2): 480 ug via SUBCUTANEOUS
  Filled 2012-06-18 (×4): qty 1.6

## 2012-06-18 MED ORDER — DIPHENOXYLATE-ATROPINE 2.5-0.025 MG PO TABS
2.0000 | ORAL_TABLET | Freq: Once | ORAL | Status: AC
  Administered 2012-06-18: 2 via ORAL
  Filled 2012-06-18: qty 2

## 2012-06-18 MED ORDER — MAGNESIUM SULFATE 40 MG/ML IJ SOLN
2.0000 g | Freq: Once | INTRAMUSCULAR | Status: AC
  Administered 2012-06-18: 2 g via INTRAVENOUS
  Filled 2012-06-18: qty 50

## 2012-06-18 MED ORDER — POTASSIUM CHLORIDE 20 MEQ/15ML (10%) PO LIQD: 40.0000 meq | Freq: Once | ORAL | Status: DC

## 2012-06-18 MED ORDER — POTASSIUM CHLORIDE 10 MEQ/100ML IV SOLN
10.0000 meq | INTRAVENOUS | Status: AC
  Administered 2012-06-18 (×3): 10 meq via INTRAVENOUS
  Filled 2012-06-18 (×3): qty 100

## 2012-06-18 NOTE — Progress Notes (Signed)
Subjective: The patient is seen and examined today. He is feeling a little bit better but his platelet counts stay low. He denied having any fever or chills. No significant chest pain or shortness of breath.  Objective: Vital signs in last 24 hours: Temp:  [97.7 F (36.5 C)-99 F (37.2 C)] 99 F (37.2 C) (06/03 0544) Pulse Rate:  [73-110] 110 (06/03 0544) Resp:  [18-19] 18 (06/03 0544) BP: (114-143)/(63-80) 139/73 mmHg (06/03 0544) SpO2:  [94 %-100 %] 100 % (06/03 0544)  Intake/Output from previous day: 06/02 0701 - 06/03 0700 In: 550 [IV Piggyback:550] Out: 209 [Urine:204; Emesis/NG output:1; Stool:4] Intake/Output this shift:    General appearance: alert, cooperative and no distress Resp: clear to auscultation bilaterally Cardio: regular rate and rhythm, S1, S2 normal, no murmur, click, rub or gallop GI: soft, non-tender; bowel sounds normal; no masses,  no organomegaly Extremities: extremities normal, atraumatic, no cyanosis or edema  Lab Results:   Recent Labs  06/17/12 0843 06/18/12 0355  WBC 0.3* 0.4*  HGB 8.1* 8.2*  HCT 22.9* 23.0*  PLT 141* 137*   BMET  Recent Labs  06/18/12 06/18/12 0355  NA 127* 127*  K 2.5* 3.3*  CL 93* 94*  CO2 26 25  GLUCOSE 146* 134*  BUN 8 7  CREATININE 0.55 0.56  CALCIUM 7.7* 7.8*    Studies/Results: No results found.  Medications: I have reviewed the patient's current medications.  Assessment/Plan: This is a very pleasant 62 years old white male with metastatic non-small cell lung cancer, squamous cell carcinoma currently on systemic chemotherapy with carboplatin and Abraxane status post 2 cycles and currently undergoing cycle #3. The patient has significant neutropenia secondary to his chemotherapy. Continue current antibiotic therapy. I will continue the patient on Neupogen 480 mcg subcutaneously until his absolute neutrophil count is over 1000. I don't think the patient is ready for discharge with his low neutrophil count  at this point. I will cancel his chemotherapy for this week.  Thank you for taking good care of Mr. Stthomas. I will continue to follow the patient with you and assist in his management.  LOS: 4 days    Toneshia Coello K. 06/18/2012

## 2012-06-18 NOTE — Progress Notes (Signed)
[facility] PROGRESS NOTE  KALEP FULL ZOX:[id number] DOB: 07-22-1950 DOA: 06/14/2012 PCP: [name], MD Brief HPI: 62 y/o with history of squamous cell carcinoma currently undergoing chemotherapy under Dr. [name] who presented to the ER with lingular pneumonia, neutropenic fever, hypokalemia, and hypomagnesemia.  Oncology on board for neutropenia.   Assessment/Plan: 1. Sepsis - Lung source. In context of patient receiving chemotherapy for squamous cell carcinoma -  Vanc and Zosyn were on board initially. Zosyn associated with hypokalemia and subsequently discontinued.  Levaquin started once zosyn discontinued. - Improving on broad spectrum antibiotics and IVF's  2. PNA - Cause of # 1 most likely - As indicated above add cefepime - supportive therapy  - supplemental oxygen. - will sputum culture pending, urine legionalla/strep antigen negative  3. Neutropenia - consulted Oncology who will provide further evaluation and recommendations - 2ary to recent chemotherapy session.  4. Sinus tachycardia - Most likely due to current infection. - agree with xopenex instead of albuterol  5. COPD - compensated. Avoid steroids given above problems - continue xopenex, symbicort  6. Sinus tachycardia - multifactorial given active infection and most likely suspect intravascular depletion - Magnesium is low and will plan on replacing  - Replace potassium - monitor on telemetry  7. Hypokalemia - Replace orally and IV - Magnesium level low - Recheck level  8. Hypomagnesemia - Replace IV and orally - Hypomagnesemia and hypokalemia likely due to history of recent diarrhea.  Will administer metamucil (fiber supplement).  Also will replace IV.  In the evening recheck potassium and magnesium levels. If still low will replace again.  Code Status: Full Family Communication: no family at bedside. Disposition Plan: Pending continued improvement in  condition.   Consultants:  Oncology  Procedures:  none  Antibiotics:  Vancomycin/zosyn: on admission  Cefepime 5/31  HPI/Subjective: Patient is inquiring about discharge.  I have discussed with him that his potassium is low and that he has neutropenia  No new complaints otherwise.  Objective: Filed Vitals:   06/18/12 1150 06/18/12 1443 06/18/12 2033 06/18/12 2046  BP:  116/72 121/69   Pulse:  122 99   Temp:  98.2 F (36.8 C) 98.6 F (37 C)   TempSrc:  Oral Oral   Resp:  18 16 18   Height:   5\' 6"  (1.676 m)   Weight:   62.143 kg (137 lb)   SpO2: 95% 98% 99%     Intake/Output Summary (Last 24 hours) at 06/18/12 2140 Last data filed at 06/18/12 1444  Gross per 24 hour  Intake    780 ml  Output    200 ml  Net    580 ml   Filed Weights   06/15/12 0415 06/18/12 2033  Weight: 64.411 kg (142 lb) 62.143 kg (137 lb)    Exam:   General:  Pt in NAD, Alert and awake  Cardiovascular: tachycardic, no murmurs  Respiratory: Rhales at L lung field, no wheezes,   Abdomen: soft, NT, ND  Musculoskeletal: no cyanosis or clubbing  Data Reviewed: Basic Metabolic Panel:  Recent Labs Lab 06/16/12 0103 06/16/12 0505  06/16/12 1728  06/17/12 0843  06/18/12 0355 06/18/12 0820 06/18/12 1200 06/18/12 1635 06/18/12 2005  NA 130* 132*  < >  --   < > 129*  < > 127* 130* 128* 130* 128*  K 2.6* 2.5*  < > 2.5*  < > 2.1*  < > 3.3* 3.1* 2.7* 3.4* 3.4*  CL 94* 94*  < >  --   < >  94*  < > 94* 93* 93* 94* 93*  CO2 24 24  < >  --   < > 25  < > 25 27 25 26 27   GLUCOSE 126* 114*  < >  --   < > 126*  < > 134* 113* 117* 106* 111*  BUN 12 12  < >  --   < > 9  < > 7 8 8 7 7   CREATININE 0.82 0.77  < >  --   < > 0.59  < > 0.56 0.55 0.55 0.55 0.56  CALCIUM 7.7* 7.9*  < >  --   < > 7.8*  < > 7.8* 8.3* 8.1* 8.2* 7.9*  MG  --  1.1*  --  1.7  --  1.5  --   --   --  1.2*  --   --   < > = values in this interval not displayed. Liver Function Tests:  Recent Labs Lab 06/13/12 1300  06/14/12 2115  AST 13 18  ALT 12 16  ALKPHOS 123 115  BILITOT 0.37 0.5  PROT 7.3 7.4  ALBUMIN 3.1* 3.1*   No results found for this basename: LIPASE, AMYLASE,  in the last 168 hours No results found for this basename: AMMONIA,  in the last 168 hours CBC:  Recent Labs Lab 06/14/12 2115 06/15/12 0508 06/16/12 0505 06/17/12 0843 06/18/12 0355  WBC 0.4* 0.2* 0.2* 0.3* 0.4*  NEUTROABS 0.1* 0.0* 0.0* 0.0* 0.0*  HGB 9.3* 7.9* 8.7* 8.1* 8.2*  HCT 27.4* 23.8* 24.8* 22.9* 23.0*  MCV 84.0 83.5 82.9 81.8 81.3  PLT 171 139* 162 141* 137*   Cardiac Enzymes:  Recent Labs Lab 06/14/12 2345 06/15/12 0508 06/15/12 1151  TROPONINI <0.30 <0.30 <0.30   BNP (last 3 results) No results found for this basename: PROBNP,  in the last 8760 hours CBG: No results found for this basename: GLUCAP,  in the last 168 hours  Recent Results (from the past 240 hour(s))  CULTURE, BLOOD (ROUTINE X 2)     Status: None   Collection Time    06/14/12  9:15 PM      Result Value Range Status   Specimen Description BLOOD RIGHT ANTECUBITAL   Final   Special Requests BOTTLES DRAWN AEROBIC AND ANAEROBIC 5CC   Final   Culture  Setup Time 06/15/2012 05:21   Final   Culture     Final   Value:        BLOOD CULTURE RECEIVED NO GROWTH TO DATE CULTURE WILL BE HELD FOR 5 DAYS BEFORE ISSUING A FINAL NEGATIVE REPORT   Report Status PENDING   Incomplete  CULTURE, BLOOD (ROUTINE X 2)     Status: None   Collection Time    06/14/12  9:25 PM      Result Value Range Status   Specimen Description BLOOD LEFT ANTECUBITAL   Final   Special Requests BOTTLES DRAWN AEROBIC AND ANAEROBIC 5CC   Final   Culture  Setup Time 06/15/2012 05:21   Final   Culture     Final   Value:        BLOOD CULTURE RECEIVED NO GROWTH TO DATE CULTURE WILL BE HELD FOR 5 DAYS BEFORE ISSUING A FINAL NEGATIVE REPORT   Report Status PENDING   Incomplete  MRSA PCR SCREENING     Status: None   Collection Time    06/15/12  1:46 AM      Result Value  Range Status   MRSA by  PCR NEGATIVE  NEGATIVE Final   Comment:            The GeneXpert MRSA Assay (FDA     approved for NASAL specimens     only), is one component of a     comprehensive MRSA colonization     surveillance program. It is not     intended to diagnose MRSA     infection nor to guide or     monitor treatment for     MRSA infections.  URINE CULTURE     Status: None   Collection Time    06/15/12  2:31 AM      Result Value Range Status   Specimen Description URINE, RANDOM   Final   Special Requests NONE   Final   Culture  Setup Time 06/15/2012 11:17   Final   Colony Count NO GROWTH   Final   Culture NO GROWTH   Final   Report Status 06/16/2012 FINAL   Final  CULTURE, EXPECTORATED SPUTUM-ASSESSMENT     Status: None   Collection Time    06/15/12  9:52 AM      Result Value Range Status   Specimen Description SPUTUM   Final   Special Requests Normal   Final   Sputum evaluation     Final   Value: MICROSCOPIC FINDINGS SUGGEST THAT THIS SPECIMEN IS NOT REPRESENTATIVE OF LOWER RESPIRATORY SECRETIONS. PLEASE RECOLLECT.     Marin Roberts [id number] @ 1137 BY J SCOTTON   Report Status 06/15/2012 FINAL   Final  CULTURE, EXPECTORATED SPUTUM-ASSESSMENT     Status: None   Collection Time    06/15/12  6:20 PM      Result Value Range Status   Specimen Description SPUTUM   Final   Special Requests NONE   Final   Sputum evaluation     Final   Value: MICROSCOPIC FINDINGS SUGGEST THAT THIS SPECIMEN IS NOT REPRESENTATIVE OF LOWER RESPIRATORY SECRETIONS. PLEASE RECOLLECT.     Philis Kendall [id number] @ 1905 BY J SCOTTON   Report Status 06/15/2012 FINAL   Final  CULTURE, EXPECTORATED SPUTUM-ASSESSMENT     Status: None   Collection Time    06/16/12  3:40 AM      Result Value Range Status   Specimen Description SPUTUM   Final   Special Requests NONE   Final   Sputum evaluation     Final   Value: MICROSCOPIC FINDINGS SUGGEST THAT THIS SPECIMEN IS NOT REPRESENTATIVE OF LOWER  RESPIRATORY SECRETIONS. PLEASE RECOLLECT.   Report Status 06/16/2012 FINAL   Final  CULTURE, RESPIRATORY (NON-EXPECTORATED)     Status: None   Collection Time    06/16/12  3:40 AM      Result Value Range Status   Specimen Description SPUTUM   Final   Special Requests NONE   Final   Gram Stain     Final   Value: NO WBC SEEN     NO SQUAMOUS EPITHELIAL CELLS SEEN     NO ORGANISMS SEEN   Culture FEW CANDIDA ALBICANS   Final   Report Status 06/18/2012 FINAL   Final  CLOSTRIDIUM DIFFICILE BY PCR     Status: None   Collection Time    06/16/12  1:17 PM      Result Value Range Status   C difficile by pcr NEGATIVE  NEGATIVE Final     Studies: No results found.  Scheduled Meds: . atorvastatin  20 mg Oral Daily  .  budesonide-formoterol  2 puff Inhalation BID  . clopidogrel  75 mg Oral Daily  . enoxaparin (LOVENOX) injection  40 mg Subcutaneous QHS  . filgrastim (NEUPOGEN)  SQ  480 mcg Subcutaneous q1800  . levofloxacin (LEVAQUIN) IV  750 mg Intravenous Q24H  . magnesium oxide  400 mg Oral BID  . metroNIDAZOLE  500 mg Oral Q8H  . potassium chloride  40 mEq Oral TID  . psyllium  1 packet Oral Daily  . sodium chloride  3 mL Intravenous Q12H  . vancomycin  1,250 mg Intravenous BID  . zolpidem  5 mg Oral QHS   Continuous Infusions:    Principal Problem:   Sepsis Active Problems:   HYPERLIPIDEMIA   HYPERTENSION   COPD   BENIGN PROSTATIC HYPERTROPHY   Neutropenia   Neutropenic fever   Sinus tachycardia   Hypomagnesemia   Hypokalemia    Time spent: > 35 minutes    [name]  [facility] Pager [phone]. If 7PM-7AM, please contact night-coverage at [email or web address], password [facility] 06/18/2012, 9:40 PM  LOS: 4 days

## 2012-06-18 NOTE — Progress Notes (Signed)
CRITICAL VALUE ALERT  Critical value received:  K+  Date of notification:  06/17/12  Time of notification:  2230  Critical value read back:yes  Nurse who received alert:  Donzetta Kohut, RN  MD notified (1st page):  Merdis Delay, NP  Time of first page:  2235  MD notified (2nd page):  Time of second page:  Responding MD:  Merdis Delay, NP   Time MD responded:  [phone]

## 2012-06-19 LAB — BASIC METABOLIC PANEL
BUN: 7 mg/dL (ref 6–23)
BUN: 8 mg/dL (ref 6–23)
Chloride: 94 mEq/L — ABNORMAL LOW (ref 96–112)
Chloride: 94 mEq/L — ABNORMAL LOW (ref 96–112)
GFR calc Af Amer: >90 mL/min (ref >90)
GFR calc non Af Amer: >90 mL/min (ref >90)
GFR calc non Af Amer: >90 mL/min (ref >90)
Glucose, Bld: 107 mg/dL — ABNORMAL HIGH (ref 70–99)
Glucose, Bld: 105 mg/dL — ABNORMAL HIGH (ref 70–99)
Potassium: 3 mEq/L — ABNORMAL LOW (ref 3.5–5.1)
Potassium: 3.2 mEq/L — ABNORMAL LOW (ref 3.5–5.1)
Sodium: 129 mEq/L — ABNORMAL LOW (ref 135–145)
Sodium: 131 mEq/L — ABNORMAL LOW (ref 135–145)

## 2012-06-19 LAB — CBC WITH DIFFERENTIAL/PLATELET
Basophils Relative: 0 % (ref 0–1)
Eosinophils Relative: 0 % (ref 0–5)
HCT: 23 % — ABNORMAL LOW (ref 39.0–52.0)
Hemoglobin: 8.2 g/dL — ABNORMAL LOW (ref 13.0–17.0)
Lymphocytes Relative: 59 % — ABNORMAL HIGH (ref 12–46)
Lymphs Abs: 0.4 10*3/uL — ABNORMAL LOW (ref 0.7–4.0)
MCV: 81.9 fL (ref 78.0–100.0)
Monocytes Relative: 20 % — ABNORMAL HIGH (ref 3–12)
Neutro Abs: 0.2 10*3/uL — ABNORMAL LOW (ref 1.7–7.7)
RBC: 2.81 MIL/uL — ABNORMAL LOW (ref 4.22–5.81)
WBC: 0.8 10*3/uL — CL (ref 4.0–10.5)

## 2012-06-19 MED ORDER — CHLORPROMAZINE HCL 25 MG PO TABS
25.0000 mg | ORAL_TABLET | Freq: Three times a day (TID) | ORAL | Status: DC | PRN
  Administered 2012-06-19: 25 mg via ORAL
  Filled 2012-06-19 (×2): qty 1

## 2012-06-19 MED ORDER — POTASSIUM CHLORIDE 20 MEQ/15ML (10%) PO LIQD
40.0000 meq | Freq: Once | ORAL | Status: AC
  Administered 2012-06-19: 40 meq via ORAL
  Filled 2012-06-19: qty 30

## 2012-06-19 MED ORDER — LOPERAMIDE HCL 2 MG PO CAPS: 4.0000 mg | ORAL_CAPSULE | Freq: Three times a day (TID) | ORAL | Status: DC | PRN

## 2012-06-19 MED ORDER — POTASSIUM CHLORIDE CRYS ER 20 MEQ PO TBCR
40.0000 meq | EXTENDED_RELEASE_TABLET | Freq: Three times a day (TID) | ORAL | Status: DC
  Administered 2012-06-19 – 2012-06-20 (×4): 40 meq via ORAL
  Filled 2012-06-19 (×6): qty 2

## 2012-06-19 MED ORDER — POTASSIUM CHLORIDE 10 MEQ/100ML IV SOLN
10.0000 meq | INTRAVENOUS | Status: AC
  Administered 2012-06-19 (×3): 10 meq via INTRAVENOUS
  Filled 2012-06-19 (×3): qty 100

## 2012-06-19 MED ORDER — LEVOFLOXACIN 750 MG PO TABS
750.0000 mg | ORAL_TABLET | Freq: Every day | ORAL | Status: DC
  Administered 2012-06-19 – 2012-06-20 (×2): 750 mg via ORAL
  Filled 2012-06-19 (×2): qty 1

## 2012-06-19 NOTE — Progress Notes (Signed)
[facility] PROGRESS NOTE  [name]:[id number] DOB: March 02, 1950 DOA: 06/14/2012  PCP: [name], MD  Brief HPI: 62 y/o with history of squamous cell carcinoma currently undergoing chemotherapy under Dr. [name] who presented to the ER with lingular pneumonia, neutropenic fever, hypokalemia, and hypomagnesemia. Oncology on board for neutropenia.  Past medical history:   Consultants:   Procedures:   Antibiotics:  Vancomycin/zosyn: on admission Cefepime 5/31 Levaquin   Subjective: Patient feels well. Denies any complaints. Diarrhea is better after he received Lomotil overnight. Tolerating diet.  Objective: Vital Signs  Filed Vitals:   06/18/12 2033 06/18/12 2046 06/19/12 0537 06/19/12 0940  BP: 121/69  144/77   Pulse: 99  107   Temp: 98.6 F (37 C)  98.8 F (37.1 C)   TempSrc: Oral  Oral   Resp: 16 18 16    Height: 5\' 6"  (1.676 m)     Weight: 62.143 kg (137 lb)     SpO2: 99%  96% 96%    Intake/Output Summary (Last 24 hours) at 06/19/12 1031 Last data filed at 06/19/12 0538  Gross per 24 hour  Intake    850 ml  Output      0 ml  Net    850 ml   Filed Weights   06/15/12 0415 06/18/12 2033  Weight: 64.411 kg (142 lb) 62.143 kg (137 lb)   General appearance: alert, cooperative, appears stated age and no distress Back: symmetric, no curvature. ROM normal. No CVA tenderness. Resp: clear to auscultation bilaterally Cardio: regular rate and rhythm, S1, S2 normal, no murmur, click, rub or gallop GI: soft, non-tender; bowel sounds normal; no masses,  no organomegaly Extremities: extremities normal, atraumatic, no cyanosis or edema Pulses: 2+ and symmetric Skin: Skin color, texture, turgor normal. No rashes or lesions Lymph nodes: Cervical, supraclavicular, and axillary nodes normal. Neurologic: A&Ox3. No focal deficits.  Lab Results:  Basic Metabolic Panel:  Recent Labs Lab 06/16/12 1728  06/17/12 0843  06/18/12 0820 06/18/12 1200  06/18/12 1635 06/18/12 2005 06/19/12 0350  NA  --   < > 129*  < > 130* 128* 130* 128* 129*  K 2.5*  < > 2.1*  < > 3.1* 2.7* 3.4* 3.4* 3.0*  CL  --   < > 94*  < > 93* 93* 94* 93* 94*  CO2  --   < > 25  < > 27 25 26 27 26   GLUCOSE  --   < > 126*  < > 113* 117* 106* 111* 107*  BUN  --   < > 9  < > 8 8 7 7 7   CREATININE  --   < > 0.59  < > 0.55 0.55 0.55 0.56 0.53  CALCIUM  --   < > 7.8*  < > 8.3* 8.1* 8.2* 7.9* 8.1*  MG 1.7  --  1.5  --   --  1.2*  --  1.1* 1.6  < > = values in this interval not displayed. Liver Function Tests:  Recent Labs Lab 06/13/12 1300 06/14/12 2115  AST 13 18  ALT 12 16  ALKPHOS 123 115  BILITOT 0.37 0.5  PROT 7.3 7.4  ALBUMIN 3.1* 3.1*   CBC:  Recent Labs Lab 06/15/12 0508 06/16/12 0505 06/17/12 0843 06/18/12 0355 06/19/12 0350  WBC 0.2* 0.2* 0.3* 0.4* 0.8*  NEUTROABS 0.0* 0.0* 0.0* 0.0* 0.2*  HGB 7.9* 8.7* 8.1* 8.2* 8.2*  HCT 23.8* 24.8* 22.9* 23.0* 23.0*  MCV 83.5 82.9 81.8 81.3 81.9  PLT  139* 162 141* 137* 122*   Cardiac Enzymes:  Recent Labs Lab 06/14/12 2345 06/15/12 0508 06/15/12 1151  TROPONINI <0.30 <0.30 <0.30    Recent Results (from the past 240 hour(s))  CULTURE, BLOOD (ROUTINE X 2)     Status: None   Collection Time    06/14/12  9:15 PM      Result Value Range Status   Specimen Description BLOOD RIGHT ANTECUBITAL   Final   Special Requests BOTTLES DRAWN AEROBIC AND ANAEROBIC 5CC   Final   Culture  Setup Time 06/15/2012 05:21   Final   Culture     Final   Value:        BLOOD CULTURE RECEIVED NO GROWTH TO DATE CULTURE WILL BE HELD FOR 5 DAYS BEFORE ISSUING A FINAL NEGATIVE REPORT   Report Status PENDING   Incomplete  CULTURE, BLOOD (ROUTINE X 2)     Status: None   Collection Time    06/14/12  9:25 PM      Result Value Range Status   Specimen Description BLOOD LEFT ANTECUBITAL   Final   Special Requests BOTTLES DRAWN AEROBIC AND ANAEROBIC 5CC   Final   Culture  Setup Time 06/15/2012 05:21   Final   Culture     Final    Value:        BLOOD CULTURE RECEIVED NO GROWTH TO DATE CULTURE WILL BE HELD FOR 5 DAYS BEFORE ISSUING A FINAL NEGATIVE REPORT   Report Status PENDING   Incomplete  MRSA PCR SCREENING     Status: None   Collection Time    06/15/12  1:46 AM      Result Value Range Status   MRSA by PCR NEGATIVE  NEGATIVE Final   Comment:            The GeneXpert MRSA Assay (FDA     approved for NASAL specimens     only), is one component of a     comprehensive MRSA colonization     surveillance program. It is not     intended to diagnose MRSA     infection nor to guide or     monitor treatment for     MRSA infections.  URINE CULTURE     Status: None   Collection Time    06/15/12  2:31 AM      Result Value Range Status   Specimen Description URINE, RANDOM   Final   Special Requests NONE   Final   Culture  Setup Time 06/15/2012 11:17   Final   Colony Count NO GROWTH   Final   Culture NO GROWTH   Final   Report Status 06/16/2012 FINAL   Final  CULTURE, EXPECTORATED SPUTUM-ASSESSMENT     Status: None   Collection Time    06/15/12  9:52 AM      Result Value Range Status   Specimen Description SPUTUM   Final   Special Requests Normal   Final   Sputum evaluation     Final   Value: MICROSCOPIC FINDINGS SUGGEST THAT THIS SPECIMEN IS NOT REPRESENTATIVE OF LOWER RESPIRATORY SECRETIONS. PLEASE RECOLLECT.     Marin Roberts [id number] @ 1137 BY J SCOTTON   Report Status 06/15/2012 FINAL   Final  CULTURE, EXPECTORATED SPUTUM-ASSESSMENT     Status: None   Collection Time    06/15/12  6:20 PM      Result Value Range Status   Specimen Description SPUTUM   Final   Special  Requests NONE   Final   Sputum evaluation     Final   Value: MICROSCOPIC FINDINGS SUGGEST THAT THIS SPECIMEN IS NOT REPRESENTATIVE OF LOWER RESPIRATORY SECRETIONS. PLEASE RECOLLECT.     Philis Kendall [id number] @ 1905 BY J SCOTTON   Report Status 06/15/2012 FINAL   Final  CULTURE, EXPECTORATED SPUTUM-ASSESSMENT     Status: None    Collection Time    06/16/12  3:40 AM      Result Value Range Status   Specimen Description SPUTUM   Final   Special Requests NONE   Final   Sputum evaluation     Final   Value: MICROSCOPIC FINDINGS SUGGEST THAT THIS SPECIMEN IS NOT REPRESENTATIVE OF LOWER RESPIRATORY SECRETIONS. PLEASE RECOLLECT.   Report Status 06/16/2012 FINAL   Final  CULTURE, RESPIRATORY (NON-EXPECTORATED)     Status: None   Collection Time    06/16/12  3:40 AM      Result Value Range Status   Specimen Description SPUTUM   Final   Special Requests NONE   Final   Gram Stain     Final   Value: NO WBC SEEN     NO SQUAMOUS EPITHELIAL CELLS SEEN     NO ORGANISMS SEEN   Culture FEW CANDIDA ALBICANS   Final   Report Status 06/18/2012 FINAL   Final  CLOSTRIDIUM DIFFICILE BY PCR     Status: None   Collection Time    06/16/12  1:17 PM      Result Value Range Status   C difficile by pcr NEGATIVE  NEGATIVE Final      Studies/Results: No results found.  Medications:  Scheduled: . atorvastatin  20 mg Oral Daily  . budesonide-formoterol  2 puff Inhalation BID  . clopidogrel  75 mg Oral Daily  . enoxaparin (LOVENOX) injection  40 mg Subcutaneous QHS  . filgrastim (NEUPOGEN)  SQ  480 mcg Subcutaneous q1800  . levofloxacin  750 mg Oral Daily  . magnesium oxide  400 mg Oral BID  . potassium chloride  10 mEq Intravenous Q1 Hr x 3  . potassium chloride  40 mEq Oral TID  . psyllium  1 packet Oral Daily  . sodium chloride  3 mL Intravenous Q12H  . zolpidem  5 mg Oral QHS   Continuous:  EAV:[id number], acetaminophen, acetaminophen, levalbuterol, loperamide, ondansetron (ZOFRAN) IV  Assessment/Plan:  Principal Problem:   Sepsis Active Problems:   HYPERLIPIDEMIA   HYPERTENSION   COPD   BENIGN PROSTATIC HYPERTROPHY   Neutropenia   Neutropenic fever   Sinus tachycardia   Hypomagnesemia   Hypokalemia   Sepsis Resolved. Was secondary to Pneumonia. Blood cultures negative. In context of patient receiving  chemotherapy for squamous cell carcinoma. Vanc and Zosyn were on board initially. Zosyn associated with hypokalemia and subsequently discontinued. Levaquin started once zosyn discontinued.   PNA  Improving. Saturating well on RA. Change to oral Levaquin. Stop Vanc. Urine legionalla/strep antigen negative   Neutropenia secondary to Chemortherapy Receiving Filgrastim. WBC improved today. Neut 200.   NSCLC Stable. Off chemo currently. Onc following.  Sinus tachycardia  Most likely due to current infection. Stable.   COPD  Well compensated. Continue xopenex, symbicort   Hypokalemia and Hypomagnesemia K continues to be low. Being repleted aggressively. Has had a few episodes of NSVT. Mg is better. With improvement in diarrhea the counts should improve.  Diarrhea Likely due to chemotherapy. C diff was negative. Imodium as needed.  DVT Prophylaxis: Enoxaparin Code Status: Full  Family Communication: No family at bedside. Discussed with patient. Disposition Plan: Await improvement in WBC.    LOS: 5 days   [facility]  [facility] Pager [phone] 06/19/2012, 10:31 AM  If 8PM-8AM, please contact night-coverage at [email or web address], password [facility]

## 2012-06-19 NOTE — ED Provider Notes (Signed)
Medical screening examination/treatment/procedure(s) were conducted as a shared visit with non-physician practitioner(s) and myself.  I personally evaluated the patient during the encounter.  Neutropenic fever w/o obvious source at this time. Empiric abx. Admission.   Raeford Razor, MD 06/19/12 2240

## 2012-06-20 ENCOUNTER — Other Ambulatory Visit: Payer: Medicare Other | Admitting: Lab

## 2012-06-20 ENCOUNTER — Ambulatory Visit: Payer: Medicare Other

## 2012-06-20 ENCOUNTER — Ambulatory Visit: Payer: Medicare Other | Admitting: Internal Medicine

## 2012-06-20 LAB — CBC WITH DIFFERENTIAL/PLATELET
Basophils Relative: 0 % (ref 0–1)
Eosinophils Relative: 0 % (ref 0–5)
HCT: 24.8 % — ABNORMAL LOW (ref 39.0–52.0)
Hemoglobin: 8.4 g/dL — ABNORMAL LOW (ref 13.0–17.0)
Lymphocytes Relative: 44 % (ref 12–46)
MCH: 28 pg (ref 26.0–34.0)
MCHC: 33.9 g/dL (ref 30.0–36.0)
Neutro Abs: 0.8 10*3/uL — ABNORMAL LOW (ref 1.7–7.7)
Neutrophils Relative %: 32 % — ABNORMAL LOW (ref 43–77)
RBC: 3 MIL/uL — ABNORMAL LOW (ref 4.22–5.81)

## 2012-06-20 LAB — BASIC METABOLIC PANEL
CO2: 27 mEq/L (ref 19–32)
Chloride: 97 mEq/L (ref 96–112)
Glucose, Bld: 102 mg/dL — ABNORMAL HIGH (ref 70–99)
Potassium: 3.5 mEq/L (ref 3.5–5.1)
Sodium: 133 mEq/L — ABNORMAL LOW (ref 135–145)

## 2012-06-20 LAB — MAGNESIUM: Magnesium: 1.2 mg/dL — ABNORMAL LOW (ref 1.5–2.5)

## 2012-06-20 MED ORDER — FILGRASTIM 480 MCG/1.6ML IJ SOLN: 480.0000 ug | Freq: Every day | INTRAMUSCULAR | Status: DC

## 2012-06-20 MED ORDER — POTASSIUM CHLORIDE CRYS ER 20 MEQ PO TBCR: 40.0000 meq | EXTENDED_RELEASE_TABLET | Freq: Two times a day (BID) | ORAL | Status: DC

## 2012-06-20 MED ORDER — MAGNESIUM OXIDE 400 (241.3 MG) MG PO TABS: 400.0000 mg | ORAL_TABLET | Freq: Two times a day (BID) | ORAL | Status: DC

## 2012-06-20 MED ORDER — LEVOFLOXACIN 750 MG PO TABS: 750.0000 mg | ORAL_TABLET | Freq: Every day | ORAL | Status: DC

## 2012-06-20 MED ORDER — LOPERAMIDE HCL 2 MG PO CAPS: 4.0000 mg | ORAL_CAPSULE | Freq: Three times a day (TID) | ORAL | Status: DC | PRN

## 2012-06-20 MED ORDER — MAGNESIUM SULFATE IN D5W 10-5 MG/ML-% IV SOLN
1.0000 g | Freq: Once | INTRAVENOUS | Status: AC
  Administered 2012-06-20: 1 g via INTRAVENOUS
  Filled 2012-06-20: qty 100

## 2012-06-20 MED ORDER — FILGRASTIM 480 MCG/1.6ML IJ SOLN
480.0000 ug | Freq: Once | INTRAMUSCULAR | Status: AC
  Administered 2012-06-20: 480 ug via SUBCUTANEOUS
  Filled 2012-06-20: qty 1.6

## 2012-06-20 NOTE — Discharge Summary (Signed)
[facility]  Physician Discharge Summary   Patient ID: [name] MRN: [id number] DOB/AGE: [birth year] 63 y.o.  Admit date: 06/14/2012 Discharge date: 06/20/2012  PCP: [name], MD  DISCHARGE DIAGNOSES:  Principal Problem:   Sepsis Active Problems:   HYPERLIPIDEMIA   HYPERTENSION   COPD   BENIGN PROSTATIC HYPERTROPHY   Neutropenia   Neutropenic fever   Sinus tachycardia   Hypomagnesemia   Hypokalemia   RECOMMENDATIONS FOR OUTPATIENT FOLLOW UP: 1. Needs blood work next week to check Potassium and Magnesium levels  DISCHARGE CONDITION: fair  Diet recommendation: Low Sodium  Filed Weights   06/15/12 0415 06/18/12 2033  Weight: 64.411 kg (142 lb) 62.143 kg (137 lb)    INITIAL HISTORY: 62 y/o with history of squamous cell carcinoma currently undergoing chemotherapy under Dr. [name] who presented to the ER with lingular pneumonia, neutropenic fever, hypokalemia, and hypomagnesemia.   Consultations:  Oncology  Procedures:  None  HOSPITAL COURSE:   Sepsis with neutropenic fever Patient was admitted with the signs and symptoms suggestive of sepsis. This was secondary to pneumonia. He was initiated on broad-spectrum antibiotics in the form of vancomycin and Zosyn. Blood cultures were drawn and have been negative so far. Patient was changed over to cefepime, instead of Zosyn due to hypokalemia. Subsequently, vancomycin was discontinued and the patient was started on Levaquin. He will be taking Levaquin for 4 more days. Patient has remained afebrile   Pneumonia considered to be healthcare associated   This was because he was receiving chemotherapy for his cancer. He will be discharged on Levaquin. Please see above for further details. He is saturating well on RA. Urine legionalla/strep antigen negative   Neutropenia secondary to Chemortherapy  Patient remained profoundly neutropenic. He was started on Neupogen. His total white cell count is  improved to 2.4. His absolute neutrophil count is 0.8. He will receive another dose of Neupogen today and will be considered stable for discharge. I have discussed this with his oncologist, and he is agreeable with this plan.   Non-small cell lung cancer   Because of his acute illness chemotherapy was withheld this week. He may resume his chemotherapy next week. Oncology will facilitate.   Sinus tachycardia  This is the most likely due to his infection and his acute illness. The issue remains stable   COPD  He may continue with his inhaled steroids.  Hypokalemia and Hypomagnesemia  This is most likely secondary to diarrhea. His electrolytes have improved. He'll be discharged on prescriptions for potassium and magnesium. He did have a few episodes of nonsustained VT when his potassium was less than 3, but those have resolved. He should have repeat levels checked next week.   Diarrhea  Likely due to chemotherapy. C diff was negative. Imodium as needed. This has improved  Overall patient remained stable. He is keen to go home. He is stable for discharge.  PERTINENT LABS:  The results of significant diagnostics from this hospitalization (including imaging, microbiology, ancillary and laboratory) are listed below for reference.    Microbiology: Recent Results (from the past 240 hour(s))  CULTURE, BLOOD (ROUTINE X 2)     Status: None   Collection Time    06/14/12  9:15 PM      Result Value Range Status   Specimen Description BLOOD RIGHT ANTECUBITAL   Final   Special Requests BOTTLES DRAWN AEROBIC AND ANAEROBIC 5CC   Final   Culture  Setup Time 06/15/2012 05:21   Final   Culture  Final   Value:        BLOOD CULTURE RECEIVED NO GROWTH TO DATE CULTURE WILL BE HELD FOR 5 DAYS BEFORE ISSUING A FINAL NEGATIVE REPORT   Report Status PENDING   Incomplete  CULTURE, BLOOD (ROUTINE X 2)     Status: None   Collection Time    06/14/12  9:25 PM      Result Value Range Status   Specimen  Description BLOOD LEFT ANTECUBITAL   Final   Special Requests BOTTLES DRAWN AEROBIC AND ANAEROBIC 5CC   Final   Culture  Setup Time 06/15/2012 05:21   Final   Culture     Final   Value:        BLOOD CULTURE RECEIVED NO GROWTH TO DATE CULTURE WILL BE HELD FOR 5 DAYS BEFORE ISSUING A FINAL NEGATIVE REPORT   Report Status PENDING   Incomplete  MRSA PCR SCREENING     Status: None   Collection Time    06/15/12  1:46 AM      Result Value Range Status   MRSA by PCR NEGATIVE  NEGATIVE Final   Comment:            The GeneXpert MRSA Assay (FDA     approved for NASAL specimens     only), is one component of a     comprehensive MRSA colonization     surveillance program. It is not     intended to diagnose MRSA     infection nor to guide or     monitor treatment for     MRSA infections.  URINE CULTURE     Status: None   Collection Time    06/15/12  2:31 AM      Result Value Range Status   Specimen Description URINE, RANDOM   Final   Special Requests NONE   Final   Culture  Setup Time 06/15/2012 11:17   Final   Colony Count NO GROWTH   Final   Culture NO GROWTH   Final   Report Status 06/16/2012 FINAL   Final  CULTURE, EXPECTORATED SPUTUM-ASSESSMENT     Status: None   Collection Time    06/15/12  9:52 AM      Result Value Range Status   Specimen Description SPUTUM   Final   Special Requests Normal   Final   Sputum evaluation     Final   Value: MICROSCOPIC FINDINGS SUGGEST THAT THIS SPECIMEN IS NOT REPRESENTATIVE OF LOWER RESPIRATORY SECRETIONS. PLEASE RECOLLECT.     Marin Roberts [id number] @ 1137 BY J SCOTTON   Report Status 06/15/2012 FINAL   Final  CULTURE, EXPECTORATED SPUTUM-ASSESSMENT     Status: None   Collection Time    06/15/12  6:20 PM      Result Value Range Status   Specimen Description SPUTUM   Final   Special Requests NONE   Final   Sputum evaluation     Final   Value: MICROSCOPIC FINDINGS SUGGEST THAT THIS SPECIMEN IS NOT REPRESENTATIVE OF LOWER RESPIRATORY  SECRETIONS. PLEASE RECOLLECT.     Philis Kendall [id number] @ 1905 BY J SCOTTON   Report Status 06/15/2012 FINAL   Final  CULTURE, EXPECTORATED SPUTUM-ASSESSMENT     Status: None   Collection Time    06/16/12  3:40 AM      Result Value Range Status   Specimen Description SPUTUM   Final   Special Requests NONE   Final   Sputum evaluation  Final   Value: MICROSCOPIC FINDINGS SUGGEST THAT THIS SPECIMEN IS NOT REPRESENTATIVE OF LOWER RESPIRATORY SECRETIONS. PLEASE RECOLLECT.   Report Status 06/16/2012 FINAL   Final  CULTURE, RESPIRATORY (NON-EXPECTORATED)     Status: None   Collection Time    06/16/12  3:40 AM      Result Value Range Status   Specimen Description SPUTUM   Final   Special Requests NONE   Final   Gram Stain     Final   Value: NO WBC SEEN     NO SQUAMOUS EPITHELIAL CELLS SEEN     NO ORGANISMS SEEN   Culture FEW CANDIDA ALBICANS   Final   Report Status 06/18/2012 FINAL   Final  CLOSTRIDIUM DIFFICILE BY PCR     Status: None   Collection Time    06/16/12  1:17 PM      Result Value Range Status   C difficile by pcr NEGATIVE  NEGATIVE Final     Labs: Basic Metabolic Panel:  Recent Labs Lab 06/17/12 0843  06/18/12 1200 06/18/12 1635 06/18/12 2005 06/19/12 0350 06/19/12 1722 06/20/12 0348  NA 129*  < > 128* 130* 128* 129* 131* 133*  K 2.1*  < > 2.7* 3.4* 3.4* 3.0* 3.2* 3.5  CL 94*  < > 93* 94* 93* 94* 94* 97  CO2 25  < > 25 26 27 26 28 27   GLUCOSE 126*  < > 117* 106* 111* 107* 105* 102*  BUN 9  < > 8 7 7 7 8 9   CREATININE 0.59  < > 0.55 0.55 0.56 0.53 0.54 0.57  CALCIUM 7.8*  < > 8.1* 8.2* 7.9* 8.1* 8.2* 8.2*  MG 1.5  --  1.2*  --  1.1* 1.6  --  1.2*  < > = values in this interval not displayed. Liver Function Tests:  Recent Labs Lab 06/13/12 1300 06/14/12 2115  AST 13 18  ALT 12 16  ALKPHOS 123 115  BILITOT 0.37 0.5  PROT 7.3 7.4  ALBUMIN 3.1* 3.1*   CBC:  Recent Labs Lab 06/16/12 0505 06/17/12 0843 06/18/12 0355 06/19/12 0350  06/20/12 0348  WBC 0.2* 0.3* 0.4* 0.8* 2.4*  NEUTROABS 0.0* 0.0* 0.0* 0.2* 0.8*  HGB 8.7* 8.1* 8.2* 8.2* 8.4*  HCT 24.8* 22.9* 23.0* 23.0* 24.8*  MCV 82.9 81.8 81.3 81.9 82.7  PLT 162 141* 137* 122* 119*   Cardiac Enzymes:  Recent Labs Lab 06/14/12 2345 06/15/12 0508 06/15/12 1151  TROPONINI <0.30 <0.30 <0.30   IMAGING STUDIES Dg Chest 2 View  06/14/2012   *RADIOLOGY REPORT*  Clinical Data: Fever, productive cough.  CHEST - 2 VIEW  Comparison: 03/20/2012  Findings: There is hyperinflation of the lungs compatible with COPD.   Increased density anteriorly on the lateral view which is likely within the lingula concerning for pneumonia.  No focal opacity on the right.  No effusions or acute bony abnormality.  IMPRESSION: COPD.  Lingular pneumonia.   Original Report Authenticated By: Charlett Nose, M.D.    DISCHARGE EXAMINATION: Filed Vitals:   06/19/12 2028 06/19/12 2031 06/20/12 0553 06/20/12 0841  BP: 125/71  117/67   Pulse: 103  108   Temp: 98.5 F (36.9 C)  98.6 F (37 C)   TempSrc: Oral  Oral   Resp: 16 18 16    Height:      Weight:      SpO2: 95%  94% 95%   General appearance: alert, cooperative, appears stated age and no distress Resp: clear to auscultation  bilaterally Cardio: regular rate and rhythm, S1, S2 normal, no murmur, click, rub or gallop Neurologic: He is alert and oriented x3. No focal neurological deficits are present  DISPOSITION: Home  Discharge Orders   Future Appointments Provider Department Dept Phone   06/20/2012 11:45 AM [facility] MEDICAL ONCOLOGY [phone]   06/20/2012 12:15 PM Si Gaul, MD [facility] MEDICAL ONCOLOGY [phone]   06/20/2012 1:15 PM [facility] [address] [facility] MED[facility] ON[facility] [phone]   06/27/2012 1:30 PM [facility] [phone]   07/04/2012 1:30 PM [facility] MEDICAL  ONCOLOGY [phone]   12/06/2012 8:30 AM Gordy Savers, MD [facility] [place] [phone]   Future Orders Complete By Expires     Diet - low sodium heart healthy  As directed     Discharge instructions  As directed     Comments:      Please follow up with Dr. [name]. You will need to have your potassium and magnesium levels checked next week.    Increase activity slowly  As directed        ALLERGIES: No Known Allergies  Current Discharge Medication List    START taking these medications   Details  levofloxacin (LEVAQUIN) 750 MG tablet Take 1 tablet (750 mg total) by mouth daily. For 3 days Qty: 3 tablet, Refills: 0    loperamide (IMODIUM) 2 MG capsule Take 2 capsules (4 mg total) by mouth 3 (three) times daily as needed for diarrhea or loose stools. Qty: 15 capsule, Refills: 0    magnesium oxide (MAG-OX) 400 (241.3 MG) MG tablet Take 1 tablet (400 mg total) by mouth 2 (two) times daily. Qty: 30 tablet, Refills: 0    potassium chloride SA (K-DUR,KLOR-CON) 20 MEQ tablet Take 2 tablets (40 mEq total) by mouth 2 (two) times daily. Qty: 14 tablet, Refills: 0      CONTINUE these medications which have NOT CHANGED   Details  acetaminophen (TYLENOL) 500 MG tablet Take 1,000 mg by mouth every 6 (six) hours as needed. For pain    aspirin 81 MG tablet Take 81 mg by mouth daily.   Associated Diagnoses: Lung cancer, unspecified laterality    atorvastatin (LIPITOR) 20 MG tablet Take 20 mg by mouth daily.    budesonide-formoterol (SYMBICORT) 160-4.5 MCG/ACT inhaler Inhale 2 puffs into the lungs 2 (two) times daily. Qty: 1 Inhaler, Refills: 12    clopidogrel (PLAVIX) 75 MG tablet Take 75 mg by mouth daily.   Associated Diagnoses: Lung cancer, unspecified laterality    gabapentin (NEURONTIN) 600 MG tablet Take 600 mg by mouth every morning.     lisinopril (PRINIVIL,ZESTRIL) 20 MG tablet Take 20 mg by mouth daily.    nortriptyline (PAMELOR) 75 MG capsule Take 75  mg by mouth at bedtime.    prochlorperazine (COMPAZINE) 10 MG tablet Take 1 tablet (10 mg total) by mouth every 6 (six) hours as needed. Qty: 60 tablet, Refills: 0   Associated Diagnoses: Lung cancer, unspecified laterality    tamsulosin (FLOMAX) 0.4 MG CAPS Take by mouth.       Follow-up Information   Follow up with [name], MD. Schedule an appointment as soon as possible for a visit in 2 weeks.   Contact information:   [address] [place] [address] [phone]       Follow up with Lajuana Matte., MD. Schedule an appointment as  soon as possible for a visit in 1 week.   Contact information:   [address] [place] [address] [phone]       TOTAL DISCHARGE TIME: 35 mins  [facility]  [facility] Pager [phone]  06/20/2012, 11:30 AM

## 2012-06-21 LAB — CULTURE, BLOOD (ROUTINE X 2): Culture: NO GROWTH

## 2012-06-27 ENCOUNTER — Telehealth: Payer: Self-pay | Admitting: *Deleted

## 2012-06-27 ENCOUNTER — Telehealth: Payer: Self-pay | Admitting: Internal Medicine

## 2012-06-27 ENCOUNTER — Other Ambulatory Visit: Payer: Medicare Other | Admitting: Lab

## 2012-06-27 NOTE — Telephone Encounter (Signed)
s.w. pt and advised on 6.19.14 appt....pt ok and aware

## 2012-06-27 NOTE — Telephone Encounter (Signed)
Per staff message and POF I have scheduled appts.  JMW  

## 2012-07-03 ENCOUNTER — Other Ambulatory Visit: Payer: Self-pay | Admitting: Oncology

## 2012-07-04 ENCOUNTER — Other Ambulatory Visit: Payer: Medicare Other | Admitting: Lab

## 2012-07-04 ENCOUNTER — Ambulatory Visit: Payer: Medicare Other

## 2012-07-04 ENCOUNTER — Telehealth: Payer: Self-pay | Admitting: *Deleted

## 2012-07-04 ENCOUNTER — Ambulatory Visit (HOSPITAL_BASED_OUTPATIENT_CLINIC_OR_DEPARTMENT_OTHER): Payer: Medicare Other | Admitting: Physician Assistant

## 2012-07-04 ENCOUNTER — Other Ambulatory Visit (HOSPITAL_BASED_OUTPATIENT_CLINIC_OR_DEPARTMENT_OTHER): Payer: Medicare Other | Admitting: Lab

## 2012-07-04 ENCOUNTER — Telehealth: Payer: Self-pay | Admitting: Internal Medicine

## 2012-07-04 DIAGNOSIS — C3490 Malignant neoplasm of unspecified part of unspecified bronchus or lung: Secondary | ICD-10-CM

## 2012-07-04 LAB — CBC WITH DIFFERENTIAL/PLATELET
BASO%: 1.4 % (ref 0.0–2.0)
Basophils Absolute: 0.1 10*3/uL (ref 0.0–0.1)
Eosinophils Absolute: 0.1 10*3/uL (ref 0.0–0.5)
HCT: 25.7 % — ABNORMAL LOW (ref 38.4–49.9)
HGB: 8.4 g/dL — ABNORMAL LOW (ref 13.0–17.1)
MCHC: 32.7 g/dL (ref 32.0–36.0)
MONO#: 0.9 10*3/uL (ref 0.1–0.9)
NEUT#: 2.9 10*3/uL (ref 1.5–6.5)
NEUT%: 47 % (ref 39.0–75.0)
Platelets: 506 10*3/uL — ABNORMAL HIGH (ref 140–400)
WBC: 6.2 10*3/uL (ref 4.0–10.3)
lymph#: 2.2 10*3/uL (ref 0.9–3.3)

## 2012-07-04 NOTE — Telephone Encounter (Signed)
Per staff message and POF I have scheduled appts.  JMW  

## 2012-07-04 NOTE — Patient Instructions (Addendum)
Followup with Dr. [name] in one week with a restaging CT scan of your chest, abdomen and pelvis to reevaluate your disease

## 2012-07-04 NOTE — Telephone Encounter (Signed)
gv and printed appt sched and avs for pt...emailed MW to add tx... °

## 2012-07-05 ENCOUNTER — Encounter: Payer: Self-pay | Admitting: Physician Assistant

## 2012-07-05 NOTE — Progress Notes (Signed)
[facility] [phone]   [phone]  OFFICE PROGRESS NOTE  [name], MD [address] [place] [address]  DIAGNOSIS: Metastatic non-small cell lung cancer, squamous cell carcinoma   PRIOR THERAPY: None   CURRENT THERAPY: Systemic chemotherapy with carboplatin for an AUC of 5 given on day 1 and Abraxane at 100 mg per meter squared given on days 1, 8 and 15 every 3 weeks. Status post 2 cycles as well as day 1 and day 8 of cycle 3.  INTERVAL HISTORY: [name] 62 y.o. male returns to the clinic today for follow up visit accompanied by his wife. He was recently hospitalized from 06/14/2012 through 06/20/2012 for neutropenic fever and a lingular pneumonia. He completed his course of Levaquin as prescribed. Today he presents stating that he is getting his strength back, although he still has some fatigue. He has had some mild nausea and vomiting that has been well-controlled with his current antiemetics. He denied any further fever or chills. The patient tolerated the last cycle of her systemic chemotherapy with carboplatin and Abraxane fairly well with no significant adverse effects. He has no chest pain, shortness breath, cough or hemoptysis.  No significant weight loss or night sweats.he missed day 15 of the second cycle secondary to neutropenia.  MEDICAL HISTORY: Past Medical History  Diagnosis Date  . BENIGN PROSTATIC HYPERTROPHY 12/24/2009  . CEREBROVASCULAR ACCIDENT, HX OF 12/24/2009  . HYPERLIPIDEMIA 12/24/2009  . HYPERTENSION 12/24/2009  . Testosterone deficiency   . ED (erectile dysfunction)   . Claudication in peripheral vascular disease, lifestyle limiting, with abnormal arterial dopplers of lower ext. 06/30/2011  . S/P angioplasty with stent to Lt. SFA 06/30/11 06/30/2011  . COPD 12/24/2009  . Shortness of breath     "from the COPD" (10/13/2011)  . Stroke 1996    "right side not as strong as left since; numbness  R leg/foot" (10/13/2011)  . Pernicious anemia     ALLERGIES:  has No Known Allergies.  MEDICATIONS:  Current Outpatient Prescriptions  Medication Sig Dispense Refill  . acetaminophen (TYLENOL) 500 MG tablet Take 1,000 mg by mouth every 6 (six) hours as needed. For pain      . aspirin 81 MG tablet Take 81 mg by mouth daily.      Marland Kitchen atorvastatin (LIPITOR) 20 MG tablet Take 20 mg by mouth daily.      . budesonide-formoterol (SYMBICORT) 160-4.5 MCG/ACT inhaler Inhale 2 puffs into the lungs 2 (two) times daily.  1 Inhaler  12  . clopidogrel (PLAVIX) 75 MG tablet Take 75 mg by mouth daily.      Marland Kitchen gabapentin (NEURONTIN) 600 MG tablet Take 600 mg by mouth every morning.       Marland Kitchen levofloxacin (LEVAQUIN) 750 MG tablet Take 1 tablet (750 mg total) by mouth daily. For 3 days  3 tablet  0  . lisinopril (PRINIVIL,ZESTRIL) 20 MG tablet Take 20 mg by mouth daily.      Marland Kitchen loperamide (IMODIUM) 2 MG capsule Take 2 capsules (4 mg total) by mouth 3 (three) times daily as needed for diarrhea or loose stools.  15 capsule  0  . magnesium oxide (MAG-OX) 400 (241.3 MG) MG tablet Take 1 tablet (400 mg total) by mouth 2 (two) times daily.  30 tablet  0  . nortriptyline (PAMELOR) 75 MG capsule Take 75 mg by mouth at bedtime.      . potassium chloride SA (K-DUR,KLOR-CON) 20 MEQ tablet  Take 2 tablets (40 mEq total) by mouth 2 (two) times daily.  14 tablet  0  . prochlorperazine (COMPAZINE) 10 MG tablet Take 1 tablet (10 mg total) by mouth every 6 (six) hours as needed.  60 tablet  0  . tamsulosin (FLOMAX) 0.4 MG CAPS Take by mouth.       No current facility-administered medications for this visit.    SURGICAL HISTORY:  Past Surgical History  Procedure Laterality Date  . Cervical fusion  1980's    "total of 3-4 OR's on my neck; pinched nerve" (10/13/2011)  . Aneurysm coiling  2007  . Peripheral arterial stent graft  06/30/2010; 10/13/10    left; left  . Video bronchoscopy Bilateral 04/03/2012    Procedure: VIDEO  BRONCHOSCOPY WITHOUT FLUORO;  Surgeon: Nyoka Cowden, MD;  Location: [facility];  Service: Cardiopulmonary;  Laterality: Bilateral;    REVIEW OF SYSTEMS:  A comprehensive review of systems was negative except for: Constitutional: positive for fatigue   PHYSICAL EXAMINATION: General appearance: alert, cooperative, fatigued and no distress Head: Normocephalic, without obvious abnormality, atraumatic Neck: no adenopathy Lymph nodes: Cervical, supraclavicular, and axillary nodes normal. Resp: clear to auscultation bilaterally Cardio: regular rate and rhythm, S1, S2 normal, no murmur, click, rub or gallop GI: soft, non-tender; bowel sounds normal; no masses,  no organomegaly Extremities: extremities normal, atraumatic, no cyanosis or edema  ECOG PERFORMANCE STATUS: 1 - Symptomatic but completely ambulatory  Blood pressure 109/65, pulse 107, temperature 97 F (36.1 C), temperature source Oral, resp. rate 20, height 5\' 6"  (1.676 m), weight 136 lb 6.4 oz (61.871 kg).  LABORATORY DATA: Lab Results  Component Value Date   WBC 6.2 07/04/2012   HGB 8.4* 07/04/2012   HCT 25.7* 07/04/2012   MCV 89.5 07/04/2012   PLT 506* 07/04/2012      Chemistry      Component Value Date/Time   NA 133* 06/20/2012 0348   NA 132* 06/13/2012 1300   K 3.5 06/20/2012 0348   K 3.3* 06/13/2012 1300   CL 97 06/20/2012 0348   CL 97* 06/13/2012 1300   CO2 27 06/20/2012 0348   CO2 25 06/13/2012 1300   BUN 9 06/20/2012 0348   BUN 13.1 06/13/2012 1300   CREATININE 0.57 06/20/2012 0348   CREATININE 1.2 06/13/2012 1300      Component Value Date/Time   CALCIUM 8.2* 06/20/2012 0348   CALCIUM 9.0 06/13/2012 1300   ALKPHOS 115 06/14/2012 2115   ALKPHOS 123 06/13/2012 1300   AST 18 06/14/2012 2115   AST 13 06/13/2012 1300   ALT 16 06/14/2012 2115   ALT 12 06/13/2012 1300   BILITOT 0.5 06/14/2012 2115   BILITOT 0.37 06/13/2012 1300       RADIOGRAPHIC STUDIES: No results found.  ASSESSMENT: this is a very pleasant 62 years old white  male with metastatic non-small cell lung cancer currently undergoing systemic chemotherapy with carboplatin and Abraxane status post 2 cycles as well as day 1 and day 8 of cycle 3.   PLAN: Patient was discussed with Dr. [name]. We will schedule the patient to have restaging CT scan of the chest, abdomen and pelvis with contrast to reevaluate his disease. He'll followup with Dr. [name] in one week for a symptom management visit prior to proceeding with cycle #4 of his systemic chemotherapy with carboplatin and Abraxane as well as discussing the results of his restaging CT scan.   Dali Kraner E , PA-C   He was advised to call immediately  if he has any concerning symptoms in the interval.  All questions were answered. The patient knows to call the clinic with any problems, questions or concerns. We can certainly see the patient much sooner if necessary.

## 2012-07-08 ENCOUNTER — Ambulatory Visit (HOSPITAL_COMMUNITY)
Admission: RE | Admit: 2012-07-08 | Discharge: 2012-07-08 | Disposition: A | Discharge status: Home / Self Care | Payer: Medicare Other | Source: Ambulatory Visit | Attending: Physician Assistant | Admitting: Physician Assistant

## 2012-07-08 DIAGNOSIS — I700 Atherosclerosis of aorta: Secondary | ICD-10-CM | POA: Insufficient documentation

## 2012-07-08 DIAGNOSIS — C3490 Malignant neoplasm of unspecified part of unspecified bronchus or lung: Secondary | ICD-10-CM | POA: Insufficient documentation

## 2012-07-08 DIAGNOSIS — K769 Liver disease, unspecified: Secondary | ICD-10-CM | POA: Insufficient documentation

## 2012-07-08 DIAGNOSIS — M47817 Spondylosis without myelopathy or radiculopathy, lumbosacral region: Secondary | ICD-10-CM | POA: Insufficient documentation

## 2012-07-08 MED ORDER — IOHEXOL 300 MG/ML  SOLN
50.0000 mL | Freq: Once | INTRAMUSCULAR | Status: AC | PRN
  Administered 2012-07-08: 50 mL via ORAL

## 2012-07-08 MED ORDER — IOHEXOL 300 MG/ML  SOLN
100.0000 mL | Freq: Once | INTRAMUSCULAR | Status: AC | PRN
  Administered 2012-07-08: 100 mL via INTRAVENOUS

## 2012-07-11 ENCOUNTER — Encounter: Payer: Self-pay | Admitting: Internal Medicine

## 2012-07-11 ENCOUNTER — Telehealth: Payer: Self-pay | Admitting: *Deleted

## 2012-07-11 ENCOUNTER — Telehealth: Payer: Self-pay | Admitting: Internal Medicine

## 2012-07-11 ENCOUNTER — Other Ambulatory Visit (HOSPITAL_BASED_OUTPATIENT_CLINIC_OR_DEPARTMENT_OTHER): Payer: Medicare Other | Admitting: Lab

## 2012-07-11 ENCOUNTER — Ambulatory Visit (HOSPITAL_BASED_OUTPATIENT_CLINIC_OR_DEPARTMENT_OTHER): Payer: Medicare Other

## 2012-07-11 ENCOUNTER — Ambulatory Visit (HOSPITAL_BASED_OUTPATIENT_CLINIC_OR_DEPARTMENT_OTHER): Payer: Medicare Other | Admitting: Internal Medicine

## 2012-07-11 DIAGNOSIS — C3410 Malignant neoplasm of upper lobe, unspecified bronchus or lung: Secondary | ICD-10-CM

## 2012-07-11 DIAGNOSIS — C778 Secondary and unspecified malignant neoplasm of lymph nodes of multiple regions: Secondary | ICD-10-CM

## 2012-07-11 DIAGNOSIS — Z5111 Encounter for antineoplastic chemotherapy: Secondary | ICD-10-CM

## 2012-07-11 DIAGNOSIS — T451X5A Adverse effect of antineoplastic and immunosuppressive drugs, initial encounter: Secondary | ICD-10-CM

## 2012-07-11 DIAGNOSIS — R5381 Other malaise: Secondary | ICD-10-CM

## 2012-07-11 LAB — COMPREHENSIVE METABOLIC PANEL (CC13)
Albumin: 2.7 g/dL — ABNORMAL LOW (ref 3.5–5.0)
Alkaline Phosphatase: 140 U/L (ref 40–150)
CO2: 26 mEq/L (ref 22–29)
Calcium: 9.4 mg/dL (ref 8.4–10.4)
Chloride: 102 mEq/L (ref 98–109)
Glucose: 90 mg/dl (ref 70–140)
Potassium: 4.4 mEq/L (ref 3.5–5.1)
Sodium: 135 mEq/L — ABNORMAL LOW (ref 136–145)
Total Protein: 7.5 g/dL (ref 6.4–8.3)

## 2012-07-11 LAB — CBC WITH DIFFERENTIAL/PLATELET
Eosinophils Absolute: 0.3 10*3/uL (ref 0.0–0.5)
HGB: 8.1 g/dL — ABNORMAL LOW (ref 13.0–17.1)
MONO#: 1 10*3/uL — ABNORMAL HIGH (ref 0.1–0.9)
NEUT#: 4.8 10*3/uL (ref 1.5–6.5)
RBC: 2.71 10*6/uL — ABNORMAL LOW (ref 4.20–5.82)
RDW: 21.8 % — ABNORMAL HIGH (ref 11.0–14.6)
WBC: 8.4 10*3/uL (ref 4.0–10.3)
lymph#: 2.2 10*3/uL (ref 0.9–3.3)

## 2012-07-11 MED ORDER — ONDANSETRON 16 MG/50ML IVPB (CHCC)
16.0000 mg | Freq: Once | INTRAVENOUS | Status: AC
  Administered 2012-07-11: 16 mg via INTRAVENOUS

## 2012-07-11 MED ORDER — PACLITAXEL PROTEIN-BOUND CHEMO INJECTION 100 MG
80.0000 mg/m2 | Freq: Once | INTRAVENOUS | Status: AC
  Administered 2012-07-11: 150 mg via INTRAVENOUS
  Filled 2012-07-11: qty 30

## 2012-07-11 MED ORDER — DEXAMETHASONE SODIUM PHOSPHATE 20 MG/5ML IJ SOLN
20.0000 mg | Freq: Once | INTRAMUSCULAR | Status: AC
  Administered 2012-07-11: 20 mg via INTRAVENOUS

## 2012-07-11 MED ORDER — SODIUM CHLORIDE 0.9 % IV SOLN
600.0000 mg | Freq: Once | INTRAVENOUS | Status: AC
  Administered 2012-07-11: 600 mg via INTRAVENOUS
  Filled 2012-07-11: qty 60

## 2012-07-11 MED ORDER — SODIUM CHLORIDE 0.9 % IV SOLN
Freq: Once | INTRAVENOUS | Status: AC
  Administered 2012-07-11: 13:00:00 via INTRAVENOUS

## 2012-07-11 NOTE — Telephone Encounter (Signed)
gv and printed appt sched and avs for pt....MW added tx.l

## 2012-07-11 NOTE — Progress Notes (Signed)
Okay to treat with chemo today and CBC results.

## 2012-07-11 NOTE — Progress Notes (Signed)
OK to treat Hgb 8.1 per Dr. [name].  TKF

## 2012-07-11 NOTE — Telephone Encounter (Signed)
Per staff message and POF I have scheduled appts. I have to move appr from 7/3 to 7/2, scheduler notified.  JMW

## 2012-07-11 NOTE — Patient Instructions (Addendum)
[facility] Discharge Instructions for Patients Receiving Chemotherapy  Today you received the following chemotherapy agents: abraxane, carboplatin  To help prevent nausea and vomiting after your treatment, we encourage you to take your nausea medication.  Take it as often as prescribed.     If you develop nausea and vomiting that is not controlled by your nausea medication, call the clinic. If it is after clinic hours your family physician or the after hours number for the clinic or go to the Emergency Department.   BELOW ARE SYMPTOMS THAT SHOULD BE REPORTED IMMEDIATELY:  *FEVER GREATER THAN 100.5 F  *CHILLS WITH OR WITHOUT FEVER  NAUSEA AND VOMITING THAT IS NOT CONTROLLED WITH YOUR NAUSEA MEDICATION  *UNUSUAL SHORTNESS OF BREATH  *UNUSUAL BRUISING OR BLEEDING  TENDERNESS IN MOUTH AND THROAT WITH OR WITHOUT PRESENCE OF ULCERS  *URINARY PROBLEMS  *BOWEL PROBLEMS  UNUSUAL RASH Items with * indicate a potential emergency and should be followed up as soon as possible.  Feel free to call the clinic you have any questions or concerns. The clinic phone number is [phone].   I have been informed and understand all the instructions given to me. I know to contact the clinic, my physician, or go to the Emergency Department if any problems should occur. I do not have any questions at this time, but understand that I may call the clinic during office hours   should I have any questions or need assistance in obtaining follow up care.    __________________________________________  _____________  __________ Signature of Patient or Authorized Representative            Date                   Time    __________________________________________ Nurse's Signature   Iron-Rich Diet An iron-rich diet contains foods that are good sources of iron. Iron is an important mineral that helps your body produce hemoglobin. Hemoglobin is a protein in red blood cells that carries  oxygen to the body's tissues. Sometimes, the iron level in your blood can be low. This may be caused by:  A lack of iron in your diet.  Blood loss.  Times of growth, such as during pregnancy or during a child's growth and development. Low levels of iron can cause a decrease in the number of red blood cells. This can result in iron deficiency anemia. Iron deficiency anemia symptoms include:  Tiredness.  Weakness.  Irritability.  Increased chance of infection. Here are some recommendations for daily iron intake:  Males older than 63 years of age need 8 mg of iron per day.  Women ages 12 to 17 need 18 mg of iron per day.  Pregnant women need 27 mg of iron per day, and women who are over 32 years of age and breastfeeding need 9 mg of iron per day.  Women over the age of 66 need 8 mg of iron per day. SOURCES OF IRON There are 2 types of iron that are found in food: heme iron and nonheme iron. Heme iron is absorbed by the body better than nonheme iron. Heme iron is found in meat, poultry, and fish. Nonheme iron is found in grains, beans, and vegetables. Heme Iron Sources Food / Iron (mg)  Chicken liver, 3 oz (85 g)/ 10 mg  Beef liver, 3 oz (85 g)/ 5.5 mg  Oysters, 3 oz (85 g)/ 8 mg  Beef, 3 oz (85 g)/ 2 to 3 mg  Shrimp, 3 oz (85 g)/ 2.8 mg  [place], 3 oz (85 g)/ 2 mg  Chicken, 3 oz (85 g) / 1 mg  Fish (tuna, halibut), 3 oz (85 g)/ 1 mg  Pork, 3 oz (85 g)/ 0.9 mg Nonheme Iron Sources Food / Iron (mg)  Ready-to-eat breakfast cereal, iron-fortified / 3.9 to 7 mg  Tofu,  cup / 3.4 mg  Kidney beans,  cup / 2.6 mg  Baked potato with skin / 2.7 mg  Asparagus,  cup / 2.2 mg  Avocado / 2 mg  Dried peaches,  cup / 1.6 mg  Raisins,  cup / 1.5 mg  Soy milk, 1 cup / 1.5 mg  Whole-wheat bread, 1 slice / 1.2 mg  Spinach, 1 cup / 0.8 mg  Broccoli,  cup / 0.6 mg IRON ABSORPTION Certain foods can decrease the body's absorption of iron. Try to avoid these foods  and beverages while eating meals with iron-containing foods:  Coffee.  Tea.  Fiber.  Soy. Foods containing vitamin C can help increase the amount of iron your body absorbs from iron sources, especially from nonheme sources. Eat foods with vitamin C along with iron-containing foods to increase your iron absorption. Foods that are high in vitamin C include many fruits and vegetables. Some good sources are:  Fresh orange juice.  Oranges.  Strawberries.  Mangoes.  Grapefruit.  Red bell peppers.  Green bell peppers.  Broccoli.  Potatoes with skin.  Tomato juice. Document Released: 08/16/2004 Document Revised: 03/27/2011 Document Reviewed: 06/23/2010 [facility] Patient Information 2014 [place], [place].  WHAT IS IRON DEFICIENCY ANEMIA AND CAN I CORRECT IT BY CHANGING MY DIET?  Iron is an essential part of hemoglobin. Without enough hemoglobin, anemia develops and the body does not get the right amount of oxygen. Iron deficiency anemia develops after the body has had a low level of iron for a long time. This is either caused by blood loss, not taking in or absorbing enough iron, or increased demands for iron (like pregnancy or rapid growth).  Foods from animal origin such as beef, chicken, and pork, are good sources of iron. Be sure to have one of these foods at each meal. Vitamin C helps your body absorb iron. Foods rich in Vitamin C include citrus, bell pepper, strawberries, spinach and cantaloupe. In some cases, iron supplements may be needed in order to correct the iron deficiency. In the case of poor absorption, extra iron may have to be given directly into the vein through a needle (intravenously).

## 2012-07-12 NOTE — Progress Notes (Signed)
[facility] [phone]   [phone]  OFFICE PROGRESS NOTE  [name], MD [address] [place] [address]  DIAGNOSIS: Metastatic non-small cell lung cancer, squamous cell carcinoma   PRIOR THERAPY: None   CURRENT THERAPY: Systemic chemotherapy with carboplatin for an AUC of 5 given on day 1 and Abraxane at 100 mg per meter squared given on days 1, 8 and 15 every 3 weeks. Status post 3 cycles.   INTERVAL HISTORY: [name] 62 y.o. male returns to the clinic today for followup visit accompanied his wife. The patient is feeling fine today with no specific complaints except for mild fatigue. He is recovering very well from his recent [place]  infection. He was treated during his hospitalization was broad-spectrum antibiotics in the form of vancomycin and Zosyn and was discharged home on Levaquin which he completed last week. He had repeat CT scan of the chest, abdomen and pelvis performed recently and he is here for evaluation and discussion of his scan results. He denied having any significant chest pain, shortness breath, cough or hemoptysis. He denied having any weight loss or night sweats. He has mild fatigue.  MEDICAL HISTORY: Past Medical History  Diagnosis Date  . BENIGN PROSTATIC HYPERTROPHY 12/24/2009  . CEREBROVASCULAR ACCIDENT, HX OF 12/24/2009  . HYPERLIPIDEMIA 12/24/2009  . HYPERTENSION 12/24/2009  . Testosterone deficiency   . ED (erectile dysfunction)   . Claudication in peripheral vascular disease, lifestyle limiting, with abnormal arterial dopplers of lower ext. 06/30/2011  . S/P angioplasty with stent to Lt. SFA 06/30/11 06/30/2011  . COPD 12/24/2009  . Shortness of breath     "from the COPD" (10/13/2011)  . Stroke 1996    "right side not as strong as left since; numbness R leg/foot" (10/13/2011)  . Pernicious anemia     ALLERGIES:  has No Known Allergies.  MEDICATIONS:  Current Outpatient  Prescriptions  Medication Sig Dispense Refill  . aspirin 81 MG tablet Take 81 mg by mouth daily.      Marland Kitchen atorvastatin (LIPITOR) 20 MG tablet Take 20 mg by mouth daily.      . clopidogrel (PLAVIX) 75 MG tablet Take 75 mg by mouth daily.      Marland Kitchen gabapentin (NEURONTIN) 600 MG tablet Take 600 mg by mouth every morning.       Marland Kitchen lisinopril (PRINIVIL,ZESTRIL) 20 MG tablet Take 20 mg by mouth daily.      . nortriptyline (PAMELOR) 75 MG capsule Take 75 mg by mouth at bedtime.      . tamsulosin (FLOMAX) 0.4 MG CAPS Take by mouth.      Marland Kitchen acetaminophen (TYLENOL) 500 MG tablet Take 1,000 mg by mouth every 6 (six) hours as needed. For pain      . budesonide-formoterol (SYMBICORT) 160-4.5 MCG/ACT inhaler Inhale 2 puffs into the lungs 2 (two) times daily.  1 Inhaler  12  . loperamide (IMODIUM) 2 MG capsule Take 2 capsules (4 mg total) by mouth 3 (three) times daily as needed for diarrhea or loose stools.  15 capsule  0  . prochlorperazine (COMPAZINE) 10 MG tablet Take 1 tablet (10 mg total) by mouth every 6 (six) hours as needed.  60 tablet  0   No current facility-administered medications for this visit.    SURGICAL HISTORY:  Past Surgical History  Procedure Laterality Date  . Cervical fusion  1980's    "total of 3-4 OR's on my neck; pinched nerve" (10/13/2011)  .  Aneurysm coiling  2007  . Peripheral arterial stent graft  06/30/2010; 10/13/10    left; left  . Video bronchoscopy Bilateral 04/03/2012    Procedure: VIDEO BRONCHOSCOPY WITHOUT FLUORO;  Surgeon: Nyoka Cowden, MD;  Location: [facility];  Service: Cardiopulmonary;  Laterality: Bilateral;    REVIEW OF SYSTEMS:  A comprehensive review of systems was negative except for: Constitutional: positive for fatigue   PHYSICAL EXAMINATION: General appearance: alert, cooperative, fatigued and no distress Head: Normocephalic, without obvious abnormality, atraumatic Neck: no adenopathy Lymph nodes: Cervical, supraclavicular, and axillary nodes  normal. Resp: clear to auscultation bilaterally Cardio: regular rate and rhythm, S1, S2 normal, no murmur, click, rub or gallop GI: soft, non-tender; bowel sounds normal; no masses,  no organomegaly Extremities: extremities normal, atraumatic, no cyanosis or edema Neurologic: Alert and oriented X 3, normal strength and tone. Normal symmetric reflexes. Normal coordination and gait  ECOG PERFORMANCE STATUS: 1 - Symptomatic but completely ambulatory  Blood pressure 110/60, pulse 115, temperature 97 F (36.1 C), temperature source Oral, resp. rate 18, height 5\' 6"  (1.676 m), weight 136 lb 4.8 oz (61.825 kg).  LABORATORY DATA: Lab Results  Component Value Date   WBC 8.4 07/11/2012   HGB 8.1* 07/11/2012   HCT 24.9* 07/11/2012   MCV 91.9 07/11/2012   PLT 396 07/11/2012      Chemistry      Component Value Date/Time   NA 135* 07/11/2012 1124   NA 133* 06/20/2012 0348   K 4.4 07/11/2012 1124   K 3.5 06/20/2012 0348   CL 97 06/20/2012 0348   CL 97* 06/13/2012 1300   CO2 26 07/11/2012 1124   CO2 27 06/20/2012 0348   BUN 6.9* 07/11/2012 1124   BUN 9 06/20/2012 0348   CREATININE 0.7 07/11/2012 1124   CREATININE 0.57 06/20/2012 0348      Component Value Date/Time   CALCIUM 9.4 07/11/2012 1124   CALCIUM 8.2* 06/20/2012 0348   ALKPHOS 140 07/11/2012 1124   ALKPHOS 115 06/14/2012 2115   AST 15 07/11/2012 1124   AST 18 06/14/2012 2115   ALT 14 07/11/2012 1124   ALT 16 06/14/2012 2115   BILITOT 0.24 07/11/2012 1124   BILITOT 0.5 06/14/2012 2115       RADIOGRAPHIC STUDIES: Dg Chest 2 View  06/14/2012   *RADIOLOGY REPORT*  Clinical Data: Fever, productive cough.  CHEST - 2 VIEW  Comparison: 03/20/2012  Findings: There is hyperinflation of the lungs compatible with COPD.   Increased density anteriorly on the lateral view which is likely within the lingula concerning for pneumonia.  No focal opacity on the right.  No effusions or acute bony abnormality.  IMPRESSION: COPD.  Lingular pneumonia.   Original Report  Authenticated By: Charlett Nose, M.D.   Ct Chest W Contrast  07/08/2012   *RADIOLOGY REPORT*  Clinical Data:  Metastatic lung cancer  CT CHEST, ABDOMEN AND PELVIS WITH CONTRAST  Technique:  Multidetector CT imaging of the chest, abdomen and pelvis was performed following the standard protocol during bolus administration of intravenous contrast.  Contrast: OMNIPAQUE IOHEXOL 300 MG/ML  SOLN  Comparison:  03/25/2012   CT CHEST  Findings:  No pleural effusion identified.  Mild changes of centrilobular emphysema identified.  Bilateral pulmonary parenchymal lesions are identified.  These appear progressive since previous exam.  For example, within the left upper lobe there is a new peripherally spiculated lesion measuring 1.8 cm, image 37/series 4.  Within the right lower lobe there is a new lesion measuring  1.3 cm, image 42/series 4.  The adjacent lesion measures 0.7 cm, image 44/series 4.  Within the right upper lobe there is an irregular spiculated lesion measuring 1.1 cm, image 24/series 4. The previous index lesion within the left upper lobe has improved from previous exam.  This now measures 7 mm, image 14/series 4. Previously 2.2 cm.  Mass within the prevascular region and encasing the left hilum currently measures 2.6 x 2.8 cm, image 25/series 2.  This is compared with 4.6 x 7.8 cm previously.  Right paratracheal lymph node is stable measuring 6 mm, image 26/series 2.  No new or progressive mediastinal or hilar adenopathy.  No axillary or supraclavicular adenopathy noted.  Review of the visualized osseous structures is significant for mild multilevel spondylosis.  IMPRESSION:  1.  Interval mixed response compared with previous exam. 2.  The mediastinal mass has decreased in size in the interval. The previously referenced left upper lobe parenchymal lesion has also decreased in the interval. 3.  Interval development of multifocal bilateral pulmonary parenchymal lesions are worrisome for metastatic disease.    CT ABDOMEN AND PELVIS  Findings:  Calcified granulomata noted within the right hepatic lobe.  The gallbladder appears normal.  No biliary dilatation.  The pancreas is unremarkable.  Normal appearance of the spleen.  The right adrenal gland is normal.  Asymmetric adreniform enlargement of the left adrenal gland is stable from previous exam. Normal appearance of both kidneys.  The urinary bladder appears within normal limits.  Prostate gland is enlarged.  Symmetric appearance of the seminal vesicles.  Calcified atherosclerotic disease affects the abdominal aorta. There is no aneurysm. No upper abdominal adenopathy.  No pelvic or inguinal adenopathy. No free fluid or abnormal fluid collections identified.  The stomach is normal.  The small bowel loops are unremarkable. Normal appearance of the colon.  Review of the visualized osseous structures is significant for multilevel degenerative disc disease within the lumbar spine. Anterolisthesis of L5 on S1 is noted.  There are bilateral L5 pars defects present.  IMPRESSION:  1.  No acute findings. 2.  No evidence for upper abdominal metastatic disease.   Original Report Authenticated By: Signa Kell, M.D.   ASSESSMENT AND PLAN: This is a very pleasant 62 years old white male with metastatic non-small cell lung cancer status post 3 cycles of systemic chemotherapy with carboplatin and Abraxane with significant improvement in the mediastinal adenopathy and disease. The patient had that interval development of multifocal bilateral pulmonary parenchymal lesions but this is most likely secondary to his recent infection and am not sure if the readings are present any metastatic disease at this point especially with the significant response in the mediastinal lymphadenopathy. I discussed the scan results and showed the images to the patient and his wife. I recommended for him to continue his current treatment with carboplatin and Abraxane. He will start cycle #4 today. The  patient would come back for followup visit in 3 weeks with the next cycle of his treatment. His fatigue is most likely secondary to chemotherapy-induced anemia. I would consider the patient for PRBCs transfusion if his hemoglobin less than 8 g/dL. The patient and his wife agreed to the current plan. He was advised to call immediately if he has any concerning symptoms in the interval. All questions were answered. The patient knows to call the clinic with any problems, questions or concerns. We can certainly see the patient much sooner if necessary.  I spent 15 minutes counseling the patient face to  face. The total time spent in the appointment was 25 minutes.

## 2012-07-12 NOTE — Patient Instructions (Signed)
Your scan showed significant improvement in the mediastinal lymphadenopathy. There are bilateral parenchymal nodules suspicious for malignancy but I think the may be related to inflammatory process from the recent infection. We will continue systemic chemotherapy with carboplatin and Abraxane. Followup visit in 3 weeks

## 2012-07-17 ENCOUNTER — Other Ambulatory Visit (HOSPITAL_BASED_OUTPATIENT_CLINIC_OR_DEPARTMENT_OTHER): Payer: Medicare Other | Admitting: Lab

## 2012-07-17 ENCOUNTER — Telehealth: Payer: Self-pay | Admitting: *Deleted

## 2012-07-17 ENCOUNTER — Ambulatory Visit (HOSPITAL_BASED_OUTPATIENT_CLINIC_OR_DEPARTMENT_OTHER): Payer: Medicare Other

## 2012-07-17 ENCOUNTER — Ambulatory Visit (INDEPENDENT_AMBULATORY_CARE_PROVIDER_SITE_OTHER): Payer: Medicare Other | Admitting: Internal Medicine

## 2012-07-17 ENCOUNTER — Encounter (HOSPITAL_COMMUNITY)
Admission: RE | Admit: 2012-07-17 | Discharge: 2012-07-17 | Disposition: A | Discharge status: Home / Self Care | Payer: Medicare Other | Source: Ambulatory Visit | Attending: Internal Medicine | Admitting: Internal Medicine

## 2012-07-17 ENCOUNTER — Encounter: Payer: Self-pay | Admitting: Internal Medicine

## 2012-07-17 ENCOUNTER — Other Ambulatory Visit: Payer: Self-pay | Admitting: Internal Medicine

## 2012-07-17 VITALS — BP 110/70 | HR 111 | Temp 98.4°F | Resp 20 | Wt 139.0 lb

## 2012-07-17 VITALS — BP 137/66 | HR 89 | Temp 97.0°F | Resp 18

## 2012-07-17 DIAGNOSIS — C778 Secondary and unspecified malignant neoplasm of lymph nodes of multiple regions: Secondary | ICD-10-CM

## 2012-07-17 DIAGNOSIS — D649 Anemia, unspecified: Secondary | ICD-10-CM | POA: Insufficient documentation

## 2012-07-17 DIAGNOSIS — I10 Essential (primary) hypertension: Secondary | ICD-10-CM

## 2012-07-17 DIAGNOSIS — C3410 Malignant neoplasm of upper lobe, unspecified bronchus or lung: Secondary | ICD-10-CM

## 2012-07-17 DIAGNOSIS — J449 Chronic obstructive pulmonary disease, unspecified: Secondary | ICD-10-CM

## 2012-07-17 DIAGNOSIS — T451X5A Adverse effect of antineoplastic and immunosuppressive drugs, initial encounter: Secondary | ICD-10-CM

## 2012-07-17 DIAGNOSIS — Z5111 Encounter for antineoplastic chemotherapy: Secondary | ICD-10-CM

## 2012-07-17 DIAGNOSIS — C3490 Malignant neoplasm of unspecified part of unspecified bronchus or lung: Secondary | ICD-10-CM

## 2012-07-17 DIAGNOSIS — D709 Neutropenia, unspecified: Secondary | ICD-10-CM

## 2012-07-17 LAB — CBC WITH DIFFERENTIAL/PLATELET
Basophils Absolute: 0.1 10*3/uL (ref 0.0–0.1)
EOS%: 3.4 % (ref 0.0–7.0)
Eosinophils Absolute: 0.1 10*3/uL (ref 0.0–0.5)
HGB: 7.4 g/dL — ABNORMAL LOW (ref 13.0–17.1)
NEUT#: 1.5 10*3/uL (ref 1.5–6.5)
RBC: 2.45 10*6/uL — ABNORMAL LOW (ref 4.20–5.82)
RDW: 21.2 % — ABNORMAL HIGH (ref 11.0–14.6)
lymph#: 1.6 10*3/uL (ref 0.9–3.3)

## 2012-07-17 LAB — COMPREHENSIVE METABOLIC PANEL (CC13)
AST: 17 U/L (ref 5–34)
Albumin: 3.1 g/dL — ABNORMAL LOW (ref 3.5–5.0)
Alkaline Phosphatase: 117 U/L (ref 40–150)
BUN: 7.8 mg/dL (ref 7.0–26.0)
Calcium: 9.4 mg/dL (ref 8.4–10.4)
Chloride: 101 mEq/L (ref 98–109)
Glucose: 99 mg/dl (ref 70–140)
Potassium: 3.2 mEq/L — ABNORMAL LOW (ref 3.5–5.1)
Sodium: 135 mEq/L — ABNORMAL LOW (ref 136–145)
Total Protein: 7.6 g/dL (ref 6.4–8.3)

## 2012-07-17 MED ORDER — PACLITAXEL PROTEIN-BOUND CHEMO INJECTION 100 MG
80.0000 mg/m2 | Freq: Once | INTRAVENOUS | Status: AC
  Administered 2012-07-17: 150 mg via INTRAVENOUS
  Filled 2012-07-17: qty 30

## 2012-07-17 MED ORDER — DIPHENHYDRAMINE HCL 25 MG PO CAPS
25.0000 mg | ORAL_CAPSULE | Freq: Once | ORAL | Status: AC
  Administered 2012-07-17: 25 mg via ORAL

## 2012-07-17 MED ORDER — ACETAMINOPHEN 325 MG PO TABS
650.0000 mg | ORAL_TABLET | Freq: Once | ORAL | Status: AC
  Administered 2012-07-17: 650 mg via ORAL

## 2012-07-17 MED ORDER — POTASSIUM CHLORIDE CRYS ER 20 MEQ PO TBCR: 20.0000 meq | EXTENDED_RELEASE_TABLET | ORAL | Status: DC

## 2012-07-17 MED ORDER — ONDANSETRON 8 MG/50ML IVPB (CHCC)
8.0000 mg | Freq: Once | INTRAVENOUS | Status: AC
  Administered 2012-07-17: 8 mg via INTRAVENOUS

## 2012-07-17 MED ORDER — DEXAMETHASONE SODIUM PHOSPHATE 10 MG/ML IJ SOLN
10.0000 mg | Freq: Once | INTRAMUSCULAR | Status: AC
  Administered 2012-07-17: 10 mg via INTRAVENOUS

## 2012-07-17 NOTE — Progress Notes (Signed)
Quick Note:  Call patient with the result and order K Dur 20 meq po qd X 7 days ______ 

## 2012-07-17 NOTE — Patient Instructions (Signed)
Followup oncology  Limit your sodium (Salt) intake   It is important that you exercise regularly, at least 20 minutes 3 to 4 times per week.  If you develop chest pain or shortness of breath seek  medical attention.  Return in 6 months for follow-up

## 2012-07-17 NOTE — Telephone Encounter (Signed)
Message copied by Caren Griffins on Wed Jul 17, 2012  5:10 PM ------      Message from: Si Gaul      Created: Wed Jul 17, 2012  3:28 PM       Call patient with the result and order K Dur 20 meq po qd X 7 days. ------

## 2012-07-17 NOTE — Patient Instructions (Signed)
[facility] Discharge Instructions for Patients Receiving Chemotherapy  Today you received the following chemotherapy agents Abraxane.   To help prevent nausea and vomiting after your treatment, we encourage you to take your nausea medication.   If you develop nausea and vomiting that is not controlled by your nausea medication, call the clinic.   BELOW ARE SYMPTOMS THAT SHOULD BE REPORTED IMMEDIATELY:  *FEVER GREATER THAN 100.5 F  *CHILLS WITH OR WITHOUT FEVER  NAUSEA AND VOMITING THAT IS NOT CONTROLLED WITH YOUR NAUSEA MEDICATION  *UNUSUAL SHORTNESS OF BREATH  *UNUSUAL BRUISING OR BLEEDING  TENDERNESS IN MOUTH AND THROAT WITH OR WITHOUT PRESENCE OF ULCERS  *URINARY PROBLEMS  *BOWEL PROBLEMS  UNUSUAL RASH Items with * indicate a potential emergency and should be followed up as soon as possible.  Feel free to call the clinic you have any questions or concerns. The clinic phone number is [phone].    

## 2012-07-17 NOTE — Progress Notes (Signed)
Subjective:    Patient ID: [name], male    DOB: 1951/01/04, 62 y.o.   MRN: [id number]  HPI  62 year old patient who is followed closely by oncology and actively treated for lung cancer. He was discharged from the hospital approximately one month ago for pneumonia and sepsis in the setting of neutropenia. He has treated hypertension and COPD. His pulmonary status seems fairly stable.  He is scheduled to see oncology later today. He has coronary artery and peripheral vascular disease was also has been stable. No new concerns or complaints denies any cough or shortness of breath  Hospital records reviewed  Past Medical History  Diagnosis Date  . BENIGN PROSTATIC HYPERTROPHY 12/24/2009  . CEREBROVASCULAR ACCIDENT, HX OF 12/24/2009  . HYPERLIPIDEMIA 12/24/2009  . HYPERTENSION 12/24/2009  . Testosterone deficiency   . ED (erectile dysfunction)   . Claudication in peripheral vascular disease, lifestyle limiting, with abnormal arterial dopplers of lower ext. 06/30/2011  . S/P angioplasty with stent to Lt. SFA 06/30/11 06/30/2011  . COPD 12/24/2009  . Shortness of breath     "from the COPD" (10/13/2011)  . Stroke 1996    "right side not as strong as left since; numbness R leg/foot" (10/13/2011)  . Pernicious anemia     History   Social History  . Marital Status: Married    Spouse Name: N/A    Number of Children: 3  . Years of Education: N/A   Occupational History  . Retired     Airline pilot   Social History Main Topics  . Smoking status: Current Every Day Smoker -- 1.00 packs/day for 45 years    Types: Cigarettes  . Smokeless tobacco: Never Used  . Alcohol Use: No  . Drug Use: No  . Sexually Active: Not Currently   Other Topics Concern  . Not on file   Social History Narrative  . No narrative on file    Past Surgical History  Procedure Laterality Date  . Cervical fusion  1980's    "total of 3-4 OR's on my neck; pinched nerve" (10/13/2011)  . Aneurysm coiling  2007  .  Peripheral arterial stent graft  06/30/2010; 10/13/10    left; left  . Video bronchoscopy Bilateral 04/03/2012    Procedure: VIDEO BRONCHOSCOPY WITHOUT FLUORO;  Surgeon: Nyoka Cowden, MD;  Location: [facility];  Service: Cardiopulmonary;  Laterality: Bilateral;    Family History  Problem Relation Age of Onset  . Emphysema Father     smoked  . Emphysema Paternal Uncle     smoked  . Heart disease Mother   . Lung cancer Father     smoked  . Lung cancer Paternal Grandfather     smoked    No Known Allergies  Current Outpatient Prescriptions on File Prior to Visit  Medication Sig Dispense Refill  . acetaminophen (TYLENOL) 500 MG tablet Take 1,000 mg by mouth every 6 (six) hours as needed. For pain      . aspirin 81 MG tablet Take 81 mg by mouth daily.      Marland Kitchen atorvastatin (LIPITOR) 20 MG tablet Take 20 mg by mouth daily.      . budesonide-formoterol (SYMBICORT) 160-4.5 MCG/ACT inhaler Inhale 2 puffs into the lungs 2 (two) times daily.  1 Inhaler  12  . clopidogrel (PLAVIX) 75 MG tablet Take 75 mg by mouth daily.      Marland Kitchen gabapentin (NEURONTIN) 600 MG tablet Take 600 mg by mouth every morning.       Marland Kitchen  lisinopril (PRINIVIL,ZESTRIL) 20 MG tablet Take 20 mg by mouth daily.      . nortriptyline (PAMELOR) 75 MG capsule Take 75 mg by mouth at bedtime.      . prochlorperazine (COMPAZINE) 10 MG tablet Take 1 tablet (10 mg total) by mouth every 6 (six) hours as needed.  60 tablet  0  . tamsulosin (FLOMAX) 0.4 MG CAPS Take by mouth.       No current facility-administered medications on file prior to visit.    BP 110/70  Pulse 111  Temp(Src) 98.4 F (36.9 C) (Oral)  Resp 20  Wt 139 lb (63.05 kg)  BMI 22.45 kg/m2  SpO2 98%      Review of Systems  Constitutional: Positive for activity change, appetite change and fatigue. Negative for fever and chills.  HENT: Negative for hearing loss, ear pain, congestion, sore throat, trouble swallowing, neck stiffness, dental problem, voice change and  tinnitus.   Eyes: Negative for pain, discharge and visual disturbance.  Respiratory: Negative for cough, chest tightness, wheezing and stridor.   Cardiovascular: Negative for chest pain, palpitations and leg swelling.  Gastrointestinal: Negative for nausea, vomiting, abdominal pain, diarrhea, constipation, blood in stool and abdominal distention.  Genitourinary: Negative for urgency, hematuria, flank pain, discharge, difficulty urinating and genital sores.  Musculoskeletal: Negative for myalgias, back pain, joint swelling, arthralgias and gait problem.  Skin: Negative for rash.  Neurological: Positive for weakness. Negative for dizziness, syncope, speech difficulty, numbness and headaches.  Hematological: Negative for adenopathy. Does not bruise/bleed easily.  Psychiatric/Behavioral: Negative for behavioral problems and dysphoric mood. The patient is not nervous/anxious.        Objective:   Physical Exam  Constitutional: He is oriented to person, place, and time. He appears well-developed. No distress.  HENT:  Head: Normocephalic.  Right Ear: External ear normal.  Left Ear: External ear normal.  Eyes: Conjunctivae and EOM are normal.  Neck: Normal range of motion.  Cardiovascular: Normal rate, regular rhythm and normal heart sounds.   Rate 100  Pulmonary/Chest: Effort normal.  Scattered coarse rhonchi. No active wheezing O2 saturation 98  Abdominal: Bowel sounds are normal.  Musculoskeletal: Normal range of motion. He exhibits no edema and no tenderness.  Neurological: He is alert and oriented to person, place, and time.  Psychiatric: He has a normal mood and affect. His behavior is normal.          Assessment & Plan:   Lung cancer Hypertension well controlled   CAD /PVD stable   Follow up oncology  Recheck here 6 months or as needed

## 2012-07-17 NOTE — Telephone Encounter (Signed)
Called and left msg

## 2012-07-18 ENCOUNTER — Other Ambulatory Visit: Payer: Self-pay | Admitting: *Deleted

## 2012-07-18 ENCOUNTER — Ambulatory Visit (HOSPITAL_BASED_OUTPATIENT_CLINIC_OR_DEPARTMENT_OTHER): Payer: Medicare Other

## 2012-07-18 ENCOUNTER — Other Ambulatory Visit: Payer: Medicare Other | Admitting: Lab

## 2012-07-18 ENCOUNTER — Telehealth: Payer: Self-pay | Admitting: *Deleted

## 2012-07-18 DIAGNOSIS — D649 Anemia, unspecified: Secondary | ICD-10-CM

## 2012-07-18 MED ORDER — SODIUM CHLORIDE 0.9 % IV SOLN
250.0000 mL | Freq: Once | INTRAVENOUS | Status: AC
  Administered 2012-07-18: 250 mL via INTRAVENOUS

## 2012-07-18 MED ORDER — ACETAMINOPHEN 325 MG PO TABS
650.0000 mg | ORAL_TABLET | Freq: Once | ORAL | Status: AC
  Administered 2012-07-18: 650 mg via ORAL

## 2012-07-18 MED ORDER — DIPHENHYDRAMINE HCL 25 MG PO CAPS
25.0000 mg | ORAL_CAPSULE | Freq: Once | ORAL | Status: AC
  Administered 2012-07-18: 25 mg via ORAL

## 2012-07-18 MED ORDER — POTASSIUM CHLORIDE CRYS ER 20 MEQ PO TBCR: 20.0000 meq | EXTENDED_RELEASE_TABLET | ORAL | Status: DC

## 2012-07-18 NOTE — Patient Instructions (Addendum)
Blood Transfusion Information WHAT IS A BLOOD TRANSFUSION? A transfusion is the replacement of blood or some of its parts. Blood is made up of multiple cells which provide different functions.  Red blood cells carry oxygen and are used for blood loss replacement.  White blood cells fight against infection.  Platelets control bleeding.  Plasma helps clot blood.  Other blood products are available for specialized needs, such as hemophilia or other clotting disorders. BEFORE THE TRANSFUSION  Who gives blood for transfusions?   You may be able to donate blood to be used at a later date on yourself (autologous donation).  Relatives can be asked to donate blood. This is generally not any safer than if you have received blood from a stranger. The same precautions are taken to ensure safety when a relative's blood is donated.  Healthy volunteers who are fully evaluated to make sure their blood is safe. This is blood bank blood. Transfusion therapy is the safest it has ever been in the practice of medicine. Before blood is taken from a donor, a complete history is taken to make sure that person has no history of diseases nor engages in risky social behavior (examples are intravenous drug use or sexual activity with multiple partners). The donor's travel history is screened to minimize risk of transmitting infections, such as malaria. The donated blood is tested for signs of infectious diseases, such as HIV and hepatitis. The blood is then tested to be sure it is compatible with you in order to minimize the chance of a transfusion reaction. If you or a relative donates blood, this is often done in anticipation of surgery and is not appropriate for emergency situations. It takes many days to process the donated blood. RISKS AND COMPLICATIONS Although transfusion therapy is very safe and saves many lives, the main dangers of transfusion include:   Getting an infectious disease.  Developing a  transfusion reaction. This is an allergic reaction to something in the blood you were given. Every precaution is taken to prevent this. The decision to have a blood transfusion has been considered carefully by your caregiver before blood is given. Blood is not given unless the benefits outweigh the risks. AFTER THE TRANSFUSION  Right after receiving a blood transfusion, you will usually feel much better and more energetic. This is especially true if your red blood cells have gotten low (anemic). The transfusion raises the level of the red blood cells which carry oxygen, and this usually causes an energy increase.  The nurse administering the transfusion will monitor you carefully for complications. HOME CARE INSTRUCTIONS  No special instructions are needed after a transfusion. You may find your energy is better. Speak with your caregiver about any limitations on activity for underlying diseases you may have. SEEK MEDICAL CARE IF:   Your condition is not improving after your transfusion.  You develop redness or irritation at the intravenous (IV) site. SEEK IMMEDIATE MEDICAL CARE IF:  Any of the following symptoms occur over the next 12 hours:  Shaking chills.  You have a temperature by mouth above 102 F (38.9 C), not controlled by medicine.  Chest, back, or muscle pain.  People around you feel you are not acting correctly or are confused.  Shortness of breath or difficulty breathing.  Dizziness and fainting.  You get a rash or develop hives.  You have a decrease in urine output.  Your urine turns a dark color or changes to pink, red, or brown. Any of the following   symptoms occur over the next 10 days:  You have a temperature by mouth above 102 F (38.9 C), not controlled by medicine.  Shortness of breath.  Weakness after normal activity.  The white part of the eye turns yellow (jaundice).  You have a decrease in the amount of urine or are urinating less often.  Your  urine turns a dark color or changes to pink, red, or brown. Document Released: 12/31/1999 Document Revised: 03/27/2011 Document Reviewed: 08/19/2007 ExitCare Patient Information 2014 [employer].  

## 2012-07-18 NOTE — Telephone Encounter (Signed)
Patient had called to see about Potassium RX. He was thinking it would be called into [facility], was done through [facility]

## 2012-07-19 LAB — TYPE AND SCREEN
ABO/RH(D): A POS
Antibody Screen: NEGATIVE
Unit division: 0

## 2012-07-25 ENCOUNTER — Ambulatory Visit: Payer: Medicare Other

## 2012-07-25 ENCOUNTER — Ambulatory Visit (HOSPITAL_BASED_OUTPATIENT_CLINIC_OR_DEPARTMENT_OTHER): Payer: Medicare Other | Admitting: Internal Medicine

## 2012-07-25 ENCOUNTER — Ambulatory Visit (HOSPITAL_BASED_OUTPATIENT_CLINIC_OR_DEPARTMENT_OTHER): Payer: Medicare Other

## 2012-07-25 ENCOUNTER — Other Ambulatory Visit (HOSPITAL_BASED_OUTPATIENT_CLINIC_OR_DEPARTMENT_OTHER): Payer: Medicare Other | Admitting: Lab

## 2012-07-25 ENCOUNTER — Telehealth: Payer: Self-pay | Admitting: Internal Medicine

## 2012-07-25 ENCOUNTER — Ambulatory Visit: Payer: Medicare Other | Admitting: Physician Assistant

## 2012-07-25 ENCOUNTER — Encounter: Payer: Self-pay | Admitting: Internal Medicine

## 2012-07-25 ENCOUNTER — Other Ambulatory Visit: Payer: Medicare Other | Admitting: Lab

## 2012-07-25 DIAGNOSIS — C3410 Malignant neoplasm of upper lobe, unspecified bronchus or lung: Secondary | ICD-10-CM

## 2012-07-25 DIAGNOSIS — D709 Neutropenia, unspecified: Secondary | ICD-10-CM

## 2012-07-25 LAB — CBC WITH DIFFERENTIAL/PLATELET
BASO%: 2.5 % — ABNORMAL HIGH (ref 0.0–2.0)
EOS%: 1.7 % (ref 0.0–7.0)
LYMPH%: 72.6 % — ABNORMAL HIGH (ref 14.0–49.0)
MCHC: 33.6 g/dL (ref 32.0–36.0)
MCV: 88 fL (ref 79.3–98.0)
MONO%: 14.3 % — ABNORMAL HIGH (ref 0.0–14.0)
NEUT#: 0.2 10*3/uL — CL (ref 1.5–6.5)
Platelets: 98 10*3/uL — ABNORMAL LOW (ref 140–400)
RBC: 3.01 10*6/uL — ABNORMAL LOW (ref 4.20–5.82)
RDW: 18.6 % — ABNORMAL HIGH (ref 11.0–14.6)
WBC: 2.4 10*3/uL — ABNORMAL LOW (ref 4.0–10.3)
nRBC: 0 % (ref 0–0)

## 2012-07-25 MED ORDER — FILGRASTIM 300 MCG/0.5ML IJ SOLN
300.0000 ug | Freq: Once | INTRAMUSCULAR | Status: AC
  Administered 2012-07-25: 300 ug via SUBCUTANEOUS
  Filled 2012-07-25: qty 0.5

## 2012-07-25 NOTE — Telephone Encounter (Signed)
Gave pt appt for lab injection until 7/12, pt put back to MD , ML is on PAL that week. emailed Marcelino Duster regarding chemo on 7/24

## 2012-07-25 NOTE — Progress Notes (Signed)
[facility] [phone]   [phone]  OFFICE PROGRESS NOTE  [name], MD [address] 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  PRIOR THERAPY: None   CURRENT THERAPY: Systemic chemotherapy with carboplatin for an AUC of 5 given on day 1 and Abraxane at 100 mg per meter squared given on days 1, 8 and 15 every 3 weeks. Status post 4 cycles.   INTERVAL HISTORY: [name] 62 y.o. male returns to the clinic today for followup visit accompanied by his wife. The patient is feeling fine today with no specific complaints. He denied having any significant fever or chills. He denied having any chest pain but continues to have shortness of breath with exertion and cough productive of whitish mucus is no hemoptysis. He has no significant weight loss or night sweats. He denied having any nausea or vomiting.  MEDICAL HISTORY: Past Medical History  Diagnosis Date  . BENIGN PROSTATIC HYPERTROPHY 12/24/2009  . CEREBROVASCULAR ACCIDENT, HX OF 12/24/2009  . HYPERLIPIDEMIA 12/24/2009  . HYPERTENSION 12/24/2009  . Testosterone deficiency   . ED (erectile dysfunction)   . Claudication in peripheral vascular disease, lifestyle limiting, with abnormal arterial dopplers of lower ext. 06/30/2011  . S/P angioplasty with stent to Lt. SFA 06/30/11 06/30/2011  . COPD 12/24/2009  . Shortness of breath     "from the COPD" (10/13/2011)  . Stroke 1996    "right side not as strong as left since; numbness R leg/foot" (10/13/2011)  . Pernicious anemia     ALLERGIES:  has No Known Allergies.  MEDICATIONS:  Current Outpatient Prescriptions  Medication Sig Dispense Refill  . aspirin 81 MG tablet Take 81 mg by mouth daily.      Marland Kitchen atorvastatin (LIPITOR) 20 MG tablet Take 20 mg by mouth daily.      . budesonide-formoterol (SYMBICORT) 160-4.5 MCG/ACT inhaler Inhale 2 puffs  into the lungs 2 (two) times daily.  1 Inhaler  12  . clopidogrel (PLAVIX) 75 MG tablet Take 75 mg by mouth daily.      Marland Kitchen gabapentin (NEURONTIN) 600 MG tablet Take 600 mg by mouth every morning.       Marland Kitchen lisinopril (PRINIVIL,ZESTRIL) 20 MG tablet Take 20 mg by mouth daily.      . nortriptyline (PAMELOR) 75 MG capsule Take 75 mg by mouth at bedtime.      . potassium chloride SA (K-DUR,KLOR-CON) 20 MEQ tablet Take 1 tablet (20 mEq total) by mouth as directed. Take 20 meq tablet x 7 days  7 tablet  0  . tamsulosin (FLOMAX) 0.4 MG CAPS Take by mouth.      Marland Kitchen acetaminophen (TYLENOL) 500 MG tablet Take 1,000 mg by mouth every 6 (six) hours as needed. For pain      . prochlorperazine (COMPAZINE) 10 MG tablet Take 1 tablet (10 mg total) by mouth every 6 (six) hours as needed.  60 tablet  0   No current facility-administered medications for this visit.    SURGICAL HISTORY:  Past Surgical History  Procedure Laterality Date  . Cervical fusion  1980's    "total of 3-4 OR's on my neck; pinched nerve" (10/13/2011)  . Aneurysm coiling  2007  . Peripheral arterial stent graft  06/30/2010; 10/13/10    left; left  . Video bronchoscopy Bilateral 04/03/2012    Procedure: VIDEO BRONCHOSCOPY WITHOUT FLUORO;  Surgeon: Nyoka Cowden, MD;  Location: [facility];  Service: Cardiopulmonary;  Laterality: Bilateral;    REVIEW OF SYSTEMS:  A comprehensive review of systems was negative except for: Respiratory: positive for cough, dyspnea on exertion and sputum   PHYSICAL EXAMINATION: General appearance: alert, cooperative and no distress Head: Normocephalic, without obvious abnormality, atraumatic Neck: no adenopathy Lymph nodes: Cervical, supraclavicular, and axillary nodes normal. Resp: clear to auscultation bilaterally Cardio: regular rate and rhythm, S1, S2 normal, no murmur, click, rub or gallop GI: soft, non-tender; bowel sounds normal; no masses,  no organomegaly Extremities: extremities normal, atraumatic,  no cyanosis or edema Neurologic: Alert and oriented X 3, normal strength and tone. Normal symmetric reflexes. Normal coordination and gait  ECOG PERFORMANCE STATUS: 1 - Symptomatic but completely ambulatory  Blood pressure 115/66, pulse 100, temperature 98.1 F (36.7 C), temperature source Oral, resp. rate 18, height 5\' 6"  (1.676 m), weight 137 lb 9.6 oz (62.415 kg).  LABORATORY DATA: Lab Results  Component Value Date   WBC 3.3* 07/17/2012   HGB 7.4* 07/17/2012   HCT 22.9* 07/17/2012   MCV 93.5 07/17/2012   PLT 262 07/17/2012      Chemistry      Component Value Date/Time   NA 135* 07/17/2012 1309   NA 133* 06/20/2012 0348   K 3.2* 07/17/2012 1309   K 3.5 06/20/2012 0348   CL 97 06/20/2012 0348   CL 97* 06/13/2012 1300   CO2 28 07/17/2012 1309   CO2 27 06/20/2012 0348   BUN 7.8 07/17/2012 1309   BUN 9 06/20/2012 0348   CREATININE 0.6* 07/17/2012 1309   CREATININE 0.57 06/20/2012 0348      Component Value Date/Time   CALCIUM 9.4 07/17/2012 1309   CALCIUM 8.2* 06/20/2012 0348   ALKPHOS 117 07/17/2012 1309   ALKPHOS 115 06/14/2012 2115   AST 17 07/17/2012 1309   AST 18 06/14/2012 2115   ALT 18 07/17/2012 1309   ALT 16 06/14/2012 2115   BILITOT 0.28 07/17/2012 1309   BILITOT 0.5 06/14/2012 2115       RADIOGRAPHIC STUDIES: Ct Chest W Contrast  07/08/2012   *RADIOLOGY REPORT*  Clinical Data:  Metastatic lung cancer  CT CHEST, ABDOMEN AND PELVIS WITH CONTRAST  Technique:  Multidetector CT imaging of the chest, abdomen and pelvis was performed following the standard protocol during bolus administration of intravenous contrast.  Contrast: OMNIPAQUE IOHEXOL 300 MG/ML  SOLN  Comparison:  03/25/2012    CT CHEST  Findings:  No pleural effusion identified.  Mild changes of centrilobular emphysema identified.  Bilateral pulmonary parenchymal lesions are identified.  These appear progressive since previous exam.  For example, within the left upper lobe there is a new peripherally spiculated lesion measuring 1.8 cm, image  37/series 4.  Within the right lower lobe there is a new lesion measuring 1.3 cm, image 42/series 4.  The adjacent lesion measures 0.7 cm, image 44/series 4.  Within the right upper lobe there is an irregular spiculated lesion measuring 1.1 cm, image 24/series 4. The previous index lesion within the left upper lobe has improved from previous exam.  This now measures 7 mm, image 14/series 4. Previously 2.2 cm.  Mass within the prevascular region and encasing the left hilum currently measures 2.6 x 2.8 cm, image 25/series 2.  This is compared with 4.6 x 7.8 cm previously.  Right paratracheal lymph node is stable measuring 6 mm, image 26/series 2.  No new or progressive mediastinal or hilar adenopathy.  No axillary or supraclavicular adenopathy noted.  Review of the visualized osseous structures is significant for mild multilevel spondylosis.  IMPRESSION:  1.  Interval mixed response compared with previous exam. 2.  The mediastinal mass has decreased in size in the interval. The previously referenced left upper lobe parenchymal lesion has also decreased in the interval. 3.  Interval development of multifocal bilateral pulmonary parenchymal lesions are worrisome for metastatic disease.    CT ABDOMEN AND PELVIS  Findings:  Calcified granulomata noted within the right hepatic lobe.  The gallbladder appears normal.  No biliary dilatation.  The pancreas is unremarkable.  Normal appearance of the spleen.  The right adrenal gland is normal.  Asymmetric adreniform enlargement of the left adrenal gland is stable from previous exam. Normal appearance of both kidneys.  The urinary bladder appears within normal limits.  Prostate gland is enlarged.  Symmetric appearance of the seminal vesicles.  Calcified atherosclerotic disease affects the abdominal aorta. There is no aneurysm. No upper abdominal adenopathy.  No pelvic or inguinal adenopathy. No free fluid or abnormal fluid collections identified.  The stomach is normal.  The  small bowel loops are unremarkable. Normal appearance of the colon.  Review of the visualized osseous structures is significant for multilevel degenerative disc disease within the lumbar spine. Anterolisthesis of L5 on S1 is noted.  There are bilateral L5 pars defects present.  IMPRESSION:  1.  No acute findings. 2.  No evidence for upper abdominal metastatic disease.   Original Report Authenticated By: Signa Kell, M.D.   ASSESSMENT AND PLAN: This is a very pleasant 62 years old white male with metastatic non-small cell lung cancer, squamous cell carcinoma currently undergoing systemic chemotherapy was carboplatin and Abraxane status post 3 cycles and he is currently undergoing cycle #4. He has significant neutropenia today. I recommended for him to hold his chemotherapy today. I will start the patient on Neupogen 300 mcg subcutaneously for the next 3 days. He will resume her systemic chemotherapy next week as scheduled. He would come back for followup visit in 2 weeks for evaluation and management any adverse effect of his treatment. The patient was advised to call immediately if he has any concerning symptoms in the interval. All questions were answered. The patient knows to call the clinic with any problems, questions or concerns. We can certainly see the patient much sooner if necessary.  I spent 15 minutes counseling the patient face to face. The total time spent in the appointment was 25 minutes.

## 2012-07-25 NOTE — Patient Instructions (Signed)
We will hold chemotherapy today secondary to neutropenia. Will start Neupogen injection for the next 3 days. Resume chemotherapy next week. Followup visit in 2 weeks

## 2012-07-26 ENCOUNTER — Telehealth: Payer: Self-pay | Admitting: *Deleted

## 2012-07-26 ENCOUNTER — Other Ambulatory Visit: Payer: Self-pay | Admitting: Oncology

## 2012-07-26 ENCOUNTER — Ambulatory Visit (HOSPITAL_BASED_OUTPATIENT_CLINIC_OR_DEPARTMENT_OTHER): Payer: Medicare Other

## 2012-07-26 VITALS — BP 103/51 | HR 100 | Temp 98.1°F

## 2012-07-26 DIAGNOSIS — D709 Neutropenia, unspecified: Secondary | ICD-10-CM

## 2012-07-26 MED ORDER — FILGRASTIM 300 MCG/0.5ML IJ SOLN
300.0000 ug | Freq: Once | INTRAMUSCULAR | Status: AC
  Administered 2012-07-26: 300 ug via SUBCUTANEOUS
  Filled 2012-07-26: qty 0.5

## 2012-07-26 NOTE — Telephone Encounter (Signed)
Per staff message and POF I have scheduled appts.  JMW  

## 2012-07-27 ENCOUNTER — Ambulatory Visit (HOSPITAL_BASED_OUTPATIENT_CLINIC_OR_DEPARTMENT_OTHER): Payer: Medicare Other

## 2012-07-27 VITALS — BP 114/58 | HR 106 | Temp 96.9°F

## 2012-07-27 DIAGNOSIS — D709 Neutropenia, unspecified: Secondary | ICD-10-CM

## 2012-07-27 MED ORDER — FILGRASTIM 300 MCG/0.5ML IJ SOLN
300.0000 ug | Freq: Once | INTRAMUSCULAR | Status: AC
  Administered 2012-07-27: 300 ug via SUBCUTANEOUS

## 2012-07-27 NOTE — Patient Instructions (Signed)
Filgrastim, G-CSF injection What is this medicine? FILGRASTIM, G-CSF (fil GRA stim) stimulates the formation of white blood cells. This medicine is given to patients with conditions that may cause a decrease in white blood cells, like those receiving certain types of chemotherapy or bone marrow transplant. It helps the bone marrow recover its ability to produce white blood cells. Increasing the amount of white blood cells helps to decrease the risk of infection and fever. This medicine may be used for other purposes; ask your health care provider or pharmacist if you have questions. What should I tell my health care provider before I take this medicine? They need to know if you have any of these conditions: -currently receiving radiation therapy -sickle cell disease -an unusual or allergic reaction to filgrastim, E. coli protein, other medicines, foods, dyes, or preservatives -pregnant or trying to get pregnant -breast-feeding How should I use this medicine? This medicine is for injection into a vein or injection under the skin. It is usually given by a health care professional in a hospital or clinic setting. If you get this medicine at home, you will be taught how to prepare and give this medicine. Always change the site for the injection under the skin. Let the solution warm to room temperature before you use it. Do not shake the solution before you withdraw a dose. Throw away any unused portion. Use exactly as directed. Take your medicine at regular intervals. Do not take your medicine more often than directed. It is important that you put your used needles and syringes in a special sharps container. Do not put them in a trash can. If you do not have a sharps container, call your pharmacist or healthcare provider to get one. Talk to your pediatrician regarding the use of this medicine in children. While this medicine may be prescribed for children for selected conditions, precautions do  apply. Overdosage: If you think you have taken too much of this medicine contact a poison control center or emergency room at once. NOTE: This medicine is only for you. Do not share this medicine with others. What if I miss a dose? Try not to miss doses. If you miss a dose take the dose as soon as you remember. If it is almost time for the next dose, do not take double doses unless told to by your doctor or health care professional. What may interact with this medicine? -lithium -medicines for cancer chemotherapy This list may not describe all possible interactions. Give your health care provider a list of all the medicines, herbs, non-prescription drugs, or dietary supplements you use. Also tell them if you smoke, drink alcohol, or use illegal drugs. Some items may interact with your medicine. What should I watch for while using this medicine? Visit your doctor or health care professional for regular checks on your progress. If you get a fever or any sign of infection while you are using this medicine, do not treat yourself. Check with your doctor or health care professional. Bone pain can usually be relieved by mild pain relievers such as acetaminophen or ibuprofen. Check with your doctor or health care professional before taking these medicines as they may hide a fever. Call your doctor or health care professional if the aches and pains are severe or do not go away. What side effects may I notice from receiving this medicine? Side effects that you should report to your doctor or health care professional as soon as possible: -allergic reactions like skin rash, itching   or hives, swelling of the face, lips, or tongue -difficulty breathing, wheezing -fever -pain, redness, or swelling at the injection site -stomach or side pain, or pain at the shoulder Side effects that usually do not require medical attention (report to your doctor or health care professional if they continue or are  bothersome): -bone pain (ribs, lower back, breast bone) -headache -skin rash This list may not describe all possible side effects. Call your doctor for medical advice about side effects. You may report side effects to FDA at 1-800-FDA-1088. Where should I keep my medicine? Keep out of the reach of children. Store in a refrigerator between 2 and 8 degrees C (36 and 46 degrees F). Do not freeze or leave in direct sunlight. If vials or syringes are left out of the refrigerator for more than 24 hours, they must be thrown away. Throw away unused vials after the expiration date on the carton. NOTE: This sheet is a summary. It may not cover all possible information. If you have questions about this medicine, talk to your doctor, pharmacist, or health care provider.  2013, [employer]. (03/20/2007 1:33:21 PM)  

## 2012-08-01 ENCOUNTER — Other Ambulatory Visit (HOSPITAL_BASED_OUTPATIENT_CLINIC_OR_DEPARTMENT_OTHER): Payer: Medicare Other | Admitting: Lab

## 2012-08-01 ENCOUNTER — Ambulatory Visit (HOSPITAL_BASED_OUTPATIENT_CLINIC_OR_DEPARTMENT_OTHER): Payer: Medicare Other

## 2012-08-01 DIAGNOSIS — Z5111 Encounter for antineoplastic chemotherapy: Secondary | ICD-10-CM

## 2012-08-01 DIAGNOSIS — C778 Secondary and unspecified malignant neoplasm of lymph nodes of multiple regions: Secondary | ICD-10-CM

## 2012-08-01 DIAGNOSIS — C3410 Malignant neoplasm of upper lobe, unspecified bronchus or lung: Secondary | ICD-10-CM

## 2012-08-01 LAB — CBC WITH DIFFERENTIAL/PLATELET
Basophils Absolute: 0 10*3/uL (ref 0.0–0.1)
HCT: 26.2 % — ABNORMAL LOW (ref 38.4–49.9)
HGB: 8.5 g/dL — ABNORMAL LOW (ref 13.0–17.1)
MONO#: 0.8 10*3/uL (ref 0.1–0.9)
NEUT#: 1.4 10*3/uL — ABNORMAL LOW (ref 1.5–6.5)
NEUT%: 30.5 % — ABNORMAL LOW (ref 39.0–75.0)
RDW: 19.7 % — ABNORMAL HIGH (ref 11.0–14.6)
WBC: 4.5 10*3/uL (ref 4.0–10.3)
lymph#: 2.3 10*3/uL (ref 0.9–3.3)

## 2012-08-01 LAB — COMPREHENSIVE METABOLIC PANEL (CC13)
AST: 13 U/L (ref 5–34)
Albumin: 2.9 g/dL — ABNORMAL LOW (ref 3.5–5.0)
BUN: 6.7 mg/dL — ABNORMAL LOW (ref 7.0–26.0)
Calcium: 8.9 mg/dL (ref 8.4–10.4)
Chloride: 104 mEq/L (ref 98–109)
Glucose: 113 mg/dl (ref 70–140)
Potassium: 3.6 mEq/L (ref 3.5–5.1)
Sodium: 136 mEq/L (ref 136–145)
Total Protein: 6.6 g/dL (ref 6.4–8.3)

## 2012-08-01 MED ORDER — SODIUM CHLORIDE 0.9 % IV SOLN
580.0000 mg | Freq: Once | INTRAVENOUS | Status: AC
  Administered 2012-08-01: 580 mg via INTRAVENOUS
  Filled 2012-08-01: qty 58

## 2012-08-01 MED ORDER — DEXAMETHASONE SODIUM PHOSPHATE 20 MG/5ML IJ SOLN
20.0000 mg | Freq: Once | INTRAMUSCULAR | Status: AC
  Administered 2012-08-01: 20 mg via INTRAVENOUS

## 2012-08-01 MED ORDER — ONDANSETRON 16 MG/50ML IVPB (CHCC)
16.0000 mg | Freq: Once | INTRAVENOUS | Status: AC
  Administered 2012-08-01: 16 mg via INTRAVENOUS

## 2012-08-01 MED ORDER — SODIUM CHLORIDE 0.9 % IV SOLN
Freq: Once | INTRAVENOUS | Status: AC
  Administered 2012-08-01: 14:00:00 via INTRAVENOUS

## 2012-08-01 MED ORDER — PACLITAXEL PROTEIN-BOUND CHEMO INJECTION 100 MG
80.0000 mg/m2 | Freq: Once | INTRAVENOUS | Status: AC
  Administered 2012-08-01: 150 mg via INTRAVENOUS
  Filled 2012-08-01: qty 30

## 2012-08-01 NOTE — Progress Notes (Signed)
Patient here for Day 1 Cycle 5 Abraxane and Carboplatin. Dr. [name] notified of ANC 1.4. OK to treat per MD. Clayborn Heron, RN

## 2012-08-01 NOTE — Patient Instructions (Addendum)
[facility] Discharge Instructions for Patients Receiving Chemotherapy  Today you received the following chemotherapy agents :  Abraxane,  Carboplatin.  To help prevent nausea and vomiting after your treatment, we encourage you to take your nausea medication as instructed by your physician.   If you develop nausea and vomiting that is not controlled by your nausea medication, call the clinic.   BELOW ARE SYMPTOMS THAT SHOULD BE REPORTED IMMEDIATELY:  *FEVER GREATER THAN 100.5 F  *CHILLS WITH OR WITHOUT FEVER  NAUSEA AND VOMITING THAT IS NOT CONTROLLED WITH YOUR NAUSEA MEDICATION  *UNUSUAL SHORTNESS OF BREATH  *UNUSUAL BRUISING OR BLEEDING  TENDERNESS IN MOUTH AND THROAT WITH OR WITHOUT PRESENCE OF ULCERS  *URINARY PROBLEMS  *BOWEL PROBLEMS  UNUSUAL RASH Items with * indicate a potential emergency and should be followed up as soon as possible.  Feel free to call the clinic you have any questions or concerns. The clinic phone number is [phone].    

## 2012-08-06 ENCOUNTER — Telehealth: Payer: Self-pay | Admitting: Internal Medicine

## 2012-08-06 NOTE — Telephone Encounter (Signed)
Called pt and left message regarding lab, MD and chemo on 08/08/12

## 2012-08-08 ENCOUNTER — Other Ambulatory Visit (HOSPITAL_BASED_OUTPATIENT_CLINIC_OR_DEPARTMENT_OTHER): Payer: Medicare Other | Admitting: Lab

## 2012-08-08 ENCOUNTER — Ambulatory Visit: Payer: Medicare Other

## 2012-08-08 ENCOUNTER — Telehealth: Payer: Self-pay | Admitting: Internal Medicine

## 2012-08-08 ENCOUNTER — Other Ambulatory Visit: Payer: Medicare Other | Admitting: Lab

## 2012-08-08 ENCOUNTER — Encounter: Payer: Self-pay | Admitting: Internal Medicine

## 2012-08-08 ENCOUNTER — Telehealth: Payer: Self-pay | Admitting: *Deleted

## 2012-08-08 ENCOUNTER — Ambulatory Visit (HOSPITAL_BASED_OUTPATIENT_CLINIC_OR_DEPARTMENT_OTHER): Payer: Medicare Other | Admitting: Internal Medicine

## 2012-08-08 DIAGNOSIS — L97509 Non-pressure chronic ulcer of other part of unspecified foot with unspecified severity: Secondary | ICD-10-CM

## 2012-08-08 DIAGNOSIS — L97529 Non-pressure chronic ulcer of other part of left foot with unspecified severity: Secondary | ICD-10-CM

## 2012-08-08 DIAGNOSIS — C3490 Malignant neoplasm of unspecified part of unspecified bronchus or lung: Secondary | ICD-10-CM

## 2012-08-08 LAB — CBC WITH DIFFERENTIAL/PLATELET
BASO%: 0.4 % (ref 0.0–2.0)
EOS%: 0.7 % (ref 0.0–7.0)
MCH: 29.7 pg (ref 27.2–33.4)
MCHC: 33.1 g/dL (ref 32.0–36.0)
MONO#: 0.1 10*3/uL (ref 0.1–0.9)
RBC: 2.86 10*6/uL — ABNORMAL LOW (ref 4.20–5.82)
RDW: 19.3 % — ABNORMAL HIGH (ref 11.0–14.6)
WBC: 2.8 10*3/uL — ABNORMAL LOW (ref 4.0–10.3)
lymph#: 1.4 10*3/uL (ref 0.9–3.3)

## 2012-08-08 MED ORDER — CEPHALEXIN 500 MG PO CAPS: 500.0000 mg | ORAL_CAPSULE | Freq: Four times a day (QID) | ORAL | Status: DC

## 2012-08-08 NOTE — Progress Notes (Signed)
[facility] [phone]   [phone]  OFFICE PROGRESS NOTE  [name], MD [address] 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   PRIOR THERAPY:None.  CURRENT THERAPY: Systemic chemotherapy with carboplatin for an AUC of 5 given on day 1 and Abraxane at 100 mg per meter squared given on days 1, 8 and 15 every 3 weeks. Status post 4 cycles.  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  CURRENT SMOKING STATUS: Current smoker and was strongly advised to quit smoking and offered smoke cessation materials.  ORAL CHEMOTHERAPY AND CONSENT: None  CURRENT BISPHOSPHONATES USE: None  LIVING WILL AND CODE STATUS: Full code   INTERVAL HISTORY: [name] 62 y.o. male returns to the clinic today for followup visit accompanied his wife. The patient is feeling fine today with no specific complaints. He tolerated the last cycle of his systemic chemotherapy with carboplatin and Abraxane fairly well. The patient denied having any significant nausea or vomiting. He denied having any chest pain, shortness breath, cough or hemoptysis. He has no fever or chills. He denied having any significant peripheral neuropathy. He had an ulcerated wound at the lateral part of the sole of left foot started 4 days ago. He is here today to start cycle #5 of his chemotherapy.  MEDICAL HISTORY: Past Medical History  Diagnosis Date  . BENIGN PROSTATIC HYPERTROPHY 12/24/2009  . CEREBROVASCULAR ACCIDENT, HX OF 12/24/2009  . HYPERLIPIDEMIA 12/24/2009  . HYPERTENSION 12/24/2009  . Testosterone deficiency   . ED (erectile dysfunction)   . Claudication in peripheral  vascular disease, lifestyle limiting, with abnormal arterial dopplers of lower ext. 06/30/2011  . S/P angioplasty with stent to Lt. SFA 06/30/11 06/30/2011  . COPD 12/24/2009  . Shortness of breath     "from the COPD" (10/13/2011)  . Stroke 1996    "right side not as strong as left since; numbness R leg/foot" (10/13/2011)  . Pernicious anemia     ALLERGIES:  has No Known Allergies.  MEDICATIONS:  Current Outpatient Prescriptions  Medication Sig Dispense Refill  . acetaminophen (TYLENOL) 500 MG tablet Take 1,000 mg by mouth every 6 (six) hours as needed. For pain      . aspirin 81 MG tablet Take 81 mg by mouth daily.      Marland Kitchen atorvastatin (LIPITOR) 20 MG tablet Take 20 mg by mouth daily.      . budesonide-formoterol (SYMBICORT) 160-4.5 MCG/ACT inhaler Inhale 2 puffs into the lungs 2 (two) times daily.  1 Inhaler  12  . gabapentin (NEURONTIN) 600 MG tablet Take 600 mg by mouth every morning.       Marland Kitchen lisinopril (PRINIVIL,ZESTRIL) 20 MG tablet Take 20 mg by mouth daily.      . nortriptyline (PAMELOR) 75 MG capsule Take 75 mg by mouth at bedtime.      . prochlorperazine (COMPAZINE) 10 MG tablet Take 1 tablet (10 mg total) by mouth every 6 (six) hours as needed.  60 tablet  0  . tamsulosin (FLOMAX) 0.4 MG CAPS Take by mouth.      . clopidogrel (PLAVIX) 75 MG tablet Take 75 mg by mouth daily.       No current facility-administered medications for this visit.  SURGICAL HISTORY:  Past Surgical History  Procedure Laterality Date  . Cervical fusion  1980's    "total of 3-4 OR's on my neck; pinched nerve" (10/13/2011)  . Aneurysm coiling  2007  . Peripheral arterial stent graft  06/30/2010; 10/13/10    left; left  . Video bronchoscopy Bilateral 04/03/2012    Procedure: VIDEO BRONCHOSCOPY WITHOUT FLUORO;  Surgeon: Nyoka Cowden, MD;  Location: [facility];  Service: Cardiopulmonary;  Laterality: Bilateral;    REVIEW OF SYSTEMS:  A comprehensive review of systems was negative except for:  Constitutional: positive for fatigue Respiratory: positive for dyspnea on exertion   PHYSICAL EXAMINATION: General appearance: alert, cooperative, fatigued and no distress Head: Normocephalic, without obvious abnormality, atraumatic Neck: no adenopathy Lymph nodes: Cervical, supraclavicular, and axillary nodes normal. Resp: clear to auscultation bilaterally Cardio: regular rate and rhythm, S1, S2 normal, no murmur, click, rub or gallop GI: soft, non-tender; bowel sounds normal; no masses,  no organomegaly Extremities: extremities normal, atraumatic, no cyanosis or edema and Ulcerated wound at the lateral part of the left foot sole. Neurologic: Alert and oriented X 3, normal strength and tone. Normal symmetric reflexes. Normal coordination and gait  ECOG PERFORMANCE STATUS: 1 - Symptomatic but completely ambulatory  Blood pressure 113/66, pulse 104, temperature 97.8 F (36.6 C), temperature source Oral, resp. rate 18, height 5\' 6"  (1.676 m), weight 136 lb 1.6 oz (61.735 kg).  LABORATORY DATA: Lab Results  Component Value Date   WBC 2.8* 08/08/2012   HGB 8.5* 08/08/2012   HCT 25.7* 08/08/2012   MCV 89.9 08/08/2012   PLT 197 08/08/2012      Chemistry      Component Value Date/Time   NA 136 08/01/2012 1246   NA 133* 06/20/2012 0348   K 3.6 08/01/2012 1246   K 3.5 06/20/2012 0348   CL 97 06/20/2012 0348   CL 97* 06/13/2012 1300   CO2 25 08/01/2012 1246   CO2 27 06/20/2012 0348   BUN 6.7* 08/01/2012 1246   BUN 9 06/20/2012 0348   CREATININE 0.6* 08/01/2012 1246   CREATININE 0.57 06/20/2012 0348      Component Value Date/Time   CALCIUM 8.9 08/01/2012 1246   CALCIUM 8.2* 06/20/2012 0348   ALKPHOS 127 08/01/2012 1246   ALKPHOS 115 06/14/2012 2115   AST 13 08/01/2012 1246   AST 18 06/14/2012 2115   ALT 13 08/01/2012 1246   ALT 16 06/14/2012 2115   BILITOT <0.20 Repeated and Verified 08/01/2012 1246   BILITOT 0.5 06/14/2012 2115       RADIOGRAPHIC STUDIES: No results found.  ASSESSMENT AND PLAN:  This is a very pleasant 62 years old white male with metastatic non-small cell lung cancer, squamous cell carcinoma currently undergoing systemic chemotherapy with carboplatin and Abraxane status post 4 cycles. He is tolerating his treatment fairly well except for the fatigue from the chemotherapy-induced anemia. The patient has a wound in the left foot. I will start him on treatment with Keflex 500 mg by mouth every 6 hours for the next 7 days. He was advised to contact his primary care physician for evaluation if no improvement in his wound. I will delay the start of cycle #5 till next week. He would come back for followup visit in one week for reevaluation before resuming her systemic chemotherapy. He was advised to call immediately if he has any concerning symptoms in the interval.  All questions were answered. The patient knows to call the clinic with any problems, questions or  concerns. We can certainly see the patient much sooner if necessary.  I spent 15 minutes counseling the patient face to face. The total time spent in the appointment was 25 minutes.

## 2012-08-08 NOTE — Telephone Encounter (Signed)
Per staff message and POF I have scheduled appts.  JMW  

## 2012-08-08 NOTE — Telephone Encounter (Signed)
gv and printed appt sched and avs for pt...emailed MW to add tx... °

## 2012-08-10 DIAGNOSIS — I96 Gangrene, not elsewhere classified: Secondary | ICD-10-CM | POA: Insufficient documentation

## 2012-08-10 NOTE — Patient Instructions (Signed)
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  CURRENT SMOKING STATUS: Current smoker and was strongly advised to quit smoking and offered smoke cessation materials.  ORAL CHEMOTHERAPY AND CONSENT: None  CURRENT BISPHOSPHONATES USE: None  LIVING WILL AND CODE STATUS: Full code

## 2012-08-15 ENCOUNTER — Ambulatory Visit (HOSPITAL_BASED_OUTPATIENT_CLINIC_OR_DEPARTMENT_OTHER): Payer: Medicare Other

## 2012-08-15 ENCOUNTER — Telehealth: Payer: Self-pay | Admitting: Internal Medicine

## 2012-08-15 ENCOUNTER — Other Ambulatory Visit: Payer: Medicare Other | Admitting: Lab

## 2012-08-15 ENCOUNTER — Ambulatory Visit (HOSPITAL_BASED_OUTPATIENT_CLINIC_OR_DEPARTMENT_OTHER): Payer: Medicare Other | Admitting: Physician Assistant

## 2012-08-15 ENCOUNTER — Other Ambulatory Visit (HOSPITAL_BASED_OUTPATIENT_CLINIC_OR_DEPARTMENT_OTHER): Payer: Medicare Other | Admitting: Lab

## 2012-08-15 ENCOUNTER — Ambulatory Visit: Payer: Medicare Other | Admitting: Lab

## 2012-08-15 DIAGNOSIS — D709 Neutropenia, unspecified: Secondary | ICD-10-CM

## 2012-08-15 DIAGNOSIS — C3410 Malignant neoplasm of upper lobe, unspecified bronchus or lung: Secondary | ICD-10-CM

## 2012-08-15 DIAGNOSIS — D702 Other drug-induced agranulocytosis: Secondary | ICD-10-CM

## 2012-08-15 DIAGNOSIS — T451X5A Adverse effect of antineoplastic and immunosuppressive drugs, initial encounter: Secondary | ICD-10-CM

## 2012-08-15 LAB — CBC WITH DIFFERENTIAL/PLATELET
BASO%: 0.8 % (ref 0.0–2.0)
Basophils Absolute: 0 10*3/uL (ref 0.0–0.1)
EOS%: 1.5 % (ref 0.0–7.0)
HCT: 24.2 % — ABNORMAL LOW (ref 38.4–49.9)
HGB: 8.3 g/dL — ABNORMAL LOW (ref 13.0–17.1)
MONO#: 0.6 10*3/uL (ref 0.1–0.9)
NEUT%: 11.5 % — ABNORMAL LOW (ref 39.0–75.0)
RDW: 21.8 % — ABNORMAL HIGH (ref 11.0–14.6)
WBC: 2.7 10*3/uL — ABNORMAL LOW (ref 4.0–10.3)
lymph#: 1.7 10*3/uL (ref 0.9–3.3)

## 2012-08-15 LAB — COMPREHENSIVE METABOLIC PANEL (CC13)
ALT: 6 U/L (ref 0–55)
AST: 11 U/L (ref 5–34)
Albumin: 2.9 g/dL — ABNORMAL LOW (ref 3.5–5.0)
CO2: 25 mEq/L (ref 22–29)
Calcium: 9 mg/dL (ref 8.4–10.4)
Chloride: 102 mEq/L (ref 98–109)
Creatinine: 0.7 mg/dL (ref 0.7–1.3)
Potassium: 3.4 mEq/L — ABNORMAL LOW (ref 3.5–5.1)

## 2012-08-15 MED ORDER — FILGRASTIM 300 MCG/0.5ML IJ SOLN
300.0000 ug | Freq: Once | INTRAMUSCULAR | Status: AC
  Administered 2012-08-15: 300 ug via SUBCUTANEOUS
  Filled 2012-08-15: qty 0.5

## 2012-08-15 NOTE — Telephone Encounter (Signed)
Gave pt appt for lab, ML, injections and chemo for August 2014

## 2012-08-15 NOTE — Progress Notes (Addendum)
[facility] [phone]   [phone]  SHARED VISIT PROGRESS NOTE  [name], MD [address] 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   PRIOR THERAPY:None.  CURRENT THERAPY: Systemic chemotherapy with carboplatin for an AUC of 5 given on day 1 and Abraxane at 100 mg per meter squared given on days 1, 8 and 15 every 3 weeks. Status post 4 cycles, as well as day 1 of cycle 5.  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  CURRENT SMOKING STATUS: Current smoker and was strongly advised to quit smoking and offered smoke cessation materials.  ORAL CHEMOTHERAPY AND CONSENT: None  CURRENT BISPHOSPHONATES USE: None  LIVING WILL AND CODE STATUS: Full code   INTERVAL HISTORY: [name] 62 y.o. male returns to the clinic today for followup visit accompanied his wife. He complains of swelling of the left foot that occurred overnight. He is currently completing a course of Keflex for an infected "ulcer" on this foot. The Keflex was prescribed by Dr. [name]. Both patient and his wife state that if he ulcer itself looks significantly better, less red and no longer using yellowish material. He now has an odd area on the bottom of the right foot that she just noticed a day or two ago. This area is not tender or red in any way. The patient is feeling fine today with no other specific complaints. He tolerated the last cycle of his systemic chemotherapy with carboplatin and Abraxane fairly well. The patient denied having any significant nausea or vomiting. He denied having any chest pain, shortness breath, cough or hemoptysis. He  has no fever or chills. He denied having any significant peripheral neuropathy. He presents to proceed with day #15 of cycle 5 of his systemic chemotherapy with carboplatin and Abraxane.  MEDICAL HISTORY: Past Medical History  Diagnosis Date  . BENIGN PROSTATIC HYPERTROPHY 12/24/2009  . CEREBROVASCULAR ACCIDENT, HX OF 12/24/2009  . HYPERLIPIDEMIA 12/24/2009  . HYPERTENSION 12/24/2009  . Testosterone deficiency   . ED (erectile dysfunction)   . Claudication in peripheral vascular disease, lifestyle limiting, with abnormal arterial dopplers of lower ext. 06/30/2011  . S/P angioplasty with stent to Lt. SFA 06/30/11 06/30/2011  . COPD 12/24/2009  . Shortness of breath     "from the COPD" (10/13/2011)  . Stroke 1996    "right side not as strong as left since; numbness R leg/foot" (10/13/2011)  . Pernicious anemia     ALLERGIES:  has No Known Allergies.  MEDICATIONS:  Current Outpatient Prescriptions  Medication Sig Dispense Refill  . acetaminophen (TYLENOL) 500 MG tablet Take 1,000 mg by mouth every 6 (six) hours as needed. For pain      . aspirin 81 MG tablet Take 81 mg by mouth daily.      Marland Kitchen atorvastatin (LIPITOR) 20 MG tablet Take 20 mg by mouth daily.      . budesonide-formoterol (SYMBICORT) 160-4.5 MCG/ACT inhaler Inhale 2 puffs into the lungs 2 (two) times daily.  1 Inhaler  12  . cephALEXin (KEFLEX) 500 MG capsule Take 1 capsule (500 mg total) by mouth 4 (four) times daily.  30 capsule  0  . clopidogrel (PLAVIX) 75  MG tablet Take 75 mg by mouth daily.      Marland Kitchen gabapentin (NEURONTIN) 600 MG tablet Take 600 mg by mouth every morning.       Marland Kitchen lisinopril (PRINIVIL,ZESTRIL) 20 MG tablet Take 20 mg by mouth daily.      . nortriptyline (PAMELOR) 75 MG capsule Take 75 mg by mouth at bedtime.      . prochlorperazine (COMPAZINE) 10 MG tablet Take 1 tablet (10 mg total) by mouth every 6 (six) hours as needed.  60 tablet  0  . tamsulosin (FLOMAX) 0.4 MG CAPS Take by mouth.       No current  facility-administered medications for this visit.    SURGICAL HISTORY:  Past Surgical History  Procedure Laterality Date  . Cervical fusion  1980's    "total of 3-4 OR's on my neck; pinched nerve" (10/13/2011)  . Aneurysm coiling  2007  . Peripheral arterial stent graft  06/30/2010; 10/13/10    left; left  . Video bronchoscopy Bilateral 04/03/2012    Procedure: VIDEO BRONCHOSCOPY WITHOUT FLUORO;  Surgeon: Nyoka Cowden, MD;  Location: [facility];  Service: Cardiopulmonary;  Laterality: Bilateral;    REVIEW OF SYSTEMS:  A comprehensive review of systems was negative except for: Constitutional: positive for fatigue Respiratory: positive for dyspnea on exertion   PHYSICAL EXAMINATION: General appearance: alert, cooperative, fatigued and no distress Head: Normocephalic, without obvious abnormality, atraumatic Neck: no adenopathy Lymph nodes: Cervical, supraclavicular, and axillary nodes normal. Resp: clear to auscultation bilaterally Cardio: regular rate and rhythm, S1, S2 normal, no murmur, click, rub or gallop GI: soft, non-tender; bowel sounds normal; no masses,  no organomegaly Extremities: extremities normal, atraumatic, no cyanosis or edema and Ulcerated wound at the lateral part of the left foot sole. Neurologic: Alert and oriented X 3, normal strength and tone. Normal symmetric reflexes. Normal coordination and gait Skin: The ulcer on the left lateral aspect of the sole of the left foot as equal with the surrounding surface area of skin, there is an erythematous bed with alopecia serous sanguinous or purulent drainage. The area remains slightly tender in the left foot has 1+ soft tissue swelling extending to the ankle. There is no erythema or warmth associated with the soft tissue swelling. The bottom of the right foot just below the metatarsal head this is an area of skin that is folded onto the sole of the foot. When the skin of the sole of the foot is stretched, this looks like an  area that may have been a previous blister formation that has dried. There is no erythema or an and no point tenderness.  ECOG PERFORMANCE STATUS: 1 - Symptomatic but completely ambulatory  Blood pressure 132/62, pulse 108, temperature 98.1 F (36.7 C), temperature source Oral, resp. rate 20, height 5\' 6"  (1.676 m), weight 137 lb 11.2 oz (62.46 kg), SpO2 100.00%.  LABORATORY DATA: Lab Results  Component Value Date   WBC 2.7 Repeated and Verified* 08/15/2012   HGB 8.3 Repeated and Verified* 08/15/2012   HCT 24.2* 08/15/2012   MCV 92.9 08/15/2012   PLT 187 08/15/2012      Chemistry      Component Value Date/Time   NA 135* 08/15/2012 1117   NA 133* 06/20/2012 0348   K 3.4* 08/15/2012 1117   K 3.5 06/20/2012 0348   CL 97 06/20/2012 0348   CL 97* 06/13/2012 1300   CO2 25 08/15/2012 1117   CO2 27 06/20/2012 0348   BUN 7.1 08/15/2012 1117  BUN 9 06/20/2012 0348   CREATININE 0.7 08/15/2012 1117   CREATININE 0.57 06/20/2012 0348      Component Value Date/Time   CALCIUM 9.0 08/15/2012 1117   CALCIUM 8.2* 06/20/2012 0348   ALKPHOS 134 08/15/2012 1117   ALKPHOS 115 06/14/2012 2115   AST 11 08/15/2012 1117   AST 18 06/14/2012 2115   ALT 6 08/15/2012 1117   ALT 16 06/14/2012 2115   BILITOT 0.24 08/15/2012 1117   BILITOT 0.5 06/14/2012 2115       RADIOGRAPHIC STUDIES: No results found.  ASSESSMENT AND PLAN: This is a very pleasant 62 years old white male with metastatic non-small cell lung cancer, squamous cell carcinoma currently undergoing systemic chemotherapy with carboplatin and Abraxane status post 4 cycles, as well as day 1 of cycle 5 given on 08/01/2012. He is tolerating his treatment fairly well except for the fatigue from the chemotherapy-induced anemia. The patient was discussed with as well as seen by Dr. [name]. The left foot wound has generally improved and he is advised to complete his course of Keflex. He is to followup with his primary care physician should he have further issues with this area.  We will continue to monitor the skin fold affecting the right foot. Unfortunately the patient remains neutropenic with a ANC of 0.3. He will be given Neupogen 300 mcg subcutaneously for the next 3 days. He will not receive day 15 of cycle 5 today do to his neutropenia. Patient will return in one week to begin day 1 of cycle 6 of systemic chemotherapy with carboplatin and Abraxane. We may have to slightly dose reduce his chemotherapy as he has missed at least one day of each cycle due to neutropenia.  Radford Pease E, PA-C   He was advised to call immediately if he has any concerning symptoms in the interval.  All questions were answered. The patient knows to call the clinic with any problems, questions or concerns. We can certainly see the patient much sooner if necessary.  ADDENDUM: Hematology/Oncology Attending: I have face to face encounter with the patient today. I recommended his care plan. The patient was accompanied by his wife. He is feeling a little bit better today with improvement in the wound of the left foot. He continues to have mild fatigue. The patient unfortunately has persistent neutropenia and he will not be able to proceed with systemic chemotherapy today. I will start the patient on Neupogen 300 mcg subcutaneously daily for 3 days. I will delay the start of his chemotherapy until next week. I would also consider reducing the dose of his upcoming cycle of chemotherapy because of his neutropenia.  He was advised to call immediately if he has any concerning symptoms in the interval. Lajuana Matte., MD 08/15/2012

## 2012-08-15 NOTE — Patient Instructions (Addendum)
Your neutrophil count is too low to proceed with chemotherapy today You will receive Neupogen injections for 3 days to help increase your neutrophil count. Complete your course of antibiotics as prescribed.  Follow up in 1 week

## 2012-08-16 ENCOUNTER — Other Ambulatory Visit: Payer: Medicare Other | Admitting: Lab

## 2012-08-16 ENCOUNTER — Encounter: Payer: Self-pay | Admitting: Internal Medicine

## 2012-08-16 ENCOUNTER — Encounter (HOSPITAL_COMMUNITY): Payer: Self-pay

## 2012-08-16 ENCOUNTER — Inpatient Hospital Stay (HOSPITAL_COMMUNITY)
Admission: AD | Admit: 2012-08-16 | Discharge: 2012-08-17 | DRG: 593 | Disposition: A | Discharge status: Home / Self Care | Payer: Medicare Other | Source: Ambulatory Visit | Attending: Internal Medicine | Admitting: Internal Medicine

## 2012-08-16 ENCOUNTER — Ambulatory Visit (INDEPENDENT_AMBULATORY_CARE_PROVIDER_SITE_OTHER): Payer: Medicare Other | Admitting: Internal Medicine

## 2012-08-16 ENCOUNTER — Inpatient Hospital Stay (HOSPITAL_COMMUNITY): Payer: Medicare Other

## 2012-08-16 VITALS — BP 150/70 | HR 119 | Temp 98.6°F | Resp 22 | Wt 141.0 lb

## 2012-08-16 DIAGNOSIS — I739 Peripheral vascular disease, unspecified: Secondary | ICD-10-CM

## 2012-08-16 DIAGNOSIS — Z8679 Personal history of other diseases of the circulatory system: Secondary | ICD-10-CM

## 2012-08-16 DIAGNOSIS — F17200 Nicotine dependence, unspecified, uncomplicated: Secondary | ICD-10-CM | POA: Diagnosis present

## 2012-08-16 DIAGNOSIS — I10 Essential (primary) hypertension: Secondary | ICD-10-CM

## 2012-08-16 DIAGNOSIS — L97529 Non-pressure chronic ulcer of other part of left foot with unspecified severity: Secondary | ICD-10-CM

## 2012-08-16 DIAGNOSIS — I96 Gangrene, not elsewhere classified: Secondary | ICD-10-CM | POA: Diagnosis present

## 2012-08-16 DIAGNOSIS — Z8673 Personal history of transient ischemic attack (TIA), and cerebral infarction without residual deficits: Secondary | ICD-10-CM

## 2012-08-16 DIAGNOSIS — R000 Tachycardia, unspecified: Secondary | ICD-10-CM | POA: Diagnosis present

## 2012-08-16 DIAGNOSIS — Z9889 Other specified postprocedural states: Secondary | ICD-10-CM

## 2012-08-16 DIAGNOSIS — Z95820 Peripheral vascular angioplasty status with implants and grafts: Secondary | ICD-10-CM

## 2012-08-16 DIAGNOSIS — L97509 Non-pressure chronic ulcer of other part of unspecified foot with unspecified severity: Secondary | ICD-10-CM

## 2012-08-16 DIAGNOSIS — D702 Other drug-induced agranulocytosis: Secondary | ICD-10-CM | POA: Diagnosis present

## 2012-08-16 DIAGNOSIS — C3490 Malignant neoplasm of unspecified part of unspecified bronchus or lung: Secondary | ICD-10-CM | POA: Diagnosis present

## 2012-08-16 DIAGNOSIS — E785 Hyperlipidemia, unspecified: Secondary | ICD-10-CM | POA: Diagnosis present

## 2012-08-16 DIAGNOSIS — T451X5A Adverse effect of antineoplastic and immunosuppressive drugs, initial encounter: Secondary | ICD-10-CM | POA: Diagnosis present

## 2012-08-16 DIAGNOSIS — J4489 Other specified chronic obstructive pulmonary disease: Secondary | ICD-10-CM

## 2012-08-16 DIAGNOSIS — Z9221 Personal history of antineoplastic chemotherapy: Secondary | ICD-10-CM

## 2012-08-16 DIAGNOSIS — D709 Neutropenia, unspecified: Secondary | ICD-10-CM

## 2012-08-16 DIAGNOSIS — D6481 Anemia due to antineoplastic chemotherapy: Secondary | ICD-10-CM | POA: Diagnosis present

## 2012-08-16 DIAGNOSIS — E876 Hypokalemia: Secondary | ICD-10-CM

## 2012-08-16 DIAGNOSIS — L02619 Cutaneous abscess of unspecified foot: Secondary | ICD-10-CM | POA: Diagnosis present

## 2012-08-16 DIAGNOSIS — N400 Enlarged prostate without lower urinary tract symptoms: Secondary | ICD-10-CM

## 2012-08-16 DIAGNOSIS — Z79899 Other long term (current) drug therapy: Secondary | ICD-10-CM

## 2012-08-16 DIAGNOSIS — E860 Dehydration: Secondary | ICD-10-CM | POA: Diagnosis present

## 2012-08-16 DIAGNOSIS — L98499 Non-pressure chronic ulcer of skin of other sites with unspecified severity: Secondary | ICD-10-CM | POA: Diagnosis present

## 2012-08-16 DIAGNOSIS — J449 Chronic obstructive pulmonary disease, unspecified: Secondary | ICD-10-CM

## 2012-08-16 LAB — CBC WITH DIFFERENTIAL/PLATELET
Basophils Relative: 0 % (ref 0–1)
Eosinophils Absolute: 0.1 10*3/uL (ref 0.0–0.7)
Eosinophils Relative: 1 % (ref 0–5)
HCT: 25 % — ABNORMAL LOW (ref 39.0–52.0)
Hemoglobin: 8.4 g/dL — ABNORMAL LOW (ref 13.0–17.0)
Lymphocytes Relative: 28 % (ref 12–46)
MCHC: 33.6 g/dL (ref 30.0–36.0)
Neutro Abs: 3.9 10*3/uL (ref 1.7–7.7)
RBC: 2.73 MIL/uL — ABNORMAL LOW (ref 4.22–5.81)

## 2012-08-16 LAB — MAGNESIUM: Magnesium: 1 mg/dL — ABNORMAL LOW (ref 1.5–2.5)

## 2012-08-16 LAB — COMPREHENSIVE METABOLIC PANEL
ALT: 7 U/L (ref 0–53)
AST: 9 U/L (ref 0–37)
Calcium: 9.1 mg/dL (ref 8.4–10.5)
Creatinine, Ser: 0.61 mg/dL (ref 0.50–1.35)
GFR calc Af Amer: >90 mL/min (ref >90)
Sodium: 133 mEq/L — ABNORMAL LOW (ref 135–145)
Total Protein: 6.7 g/dL (ref 6.0–8.3)

## 2012-08-16 LAB — TSH: TSH: 0.466 u[IU]/mL (ref 0.350–4.500)

## 2012-08-16 MED ORDER — MORPHINE SULFATE 2 MG/ML IJ SOLN
1.0000 mg | INTRAMUSCULAR | Status: DC | PRN
  Administered 2012-08-16: 1 mg via INTRAVENOUS
  Filled 2012-08-16: qty 1

## 2012-08-16 MED ORDER — ONDANSETRON HCL 4 MG/2ML IJ SOLN: 4.0000 mg | Freq: Four times a day (QID) | INTRAMUSCULAR | Status: DC | PRN

## 2012-08-16 MED ORDER — SODIUM CHLORIDE 0.9 % IJ SOLN
3.0000 mL | Freq: Two times a day (BID) | INTRAMUSCULAR | Status: DC
  Administered 2012-08-16 (×2): 3 mL via INTRAVENOUS

## 2012-08-16 MED ORDER — ENOXAPARIN SODIUM 40 MG/0.4ML ~~LOC~~ SOLN
40.0000 mg | Freq: Every day | SUBCUTANEOUS | Status: DC
  Administered 2012-08-16: 40 mg via SUBCUTANEOUS
  Filled 2012-08-16 (×2): qty 0.4

## 2012-08-16 MED ORDER — GABAPENTIN 600 MG PO TABS
600.0000 mg | ORAL_TABLET | Freq: Every day | ORAL | Status: DC
  Filled 2012-08-16: qty 1

## 2012-08-16 MED ORDER — SODIUM CHLORIDE 0.9 % IV SOLN
INTRAVENOUS | Status: AC
  Administered 2012-08-16: 75 mL/h via INTRAVENOUS

## 2012-08-16 MED ORDER — FOLIC ACID 1 MG PO TABS
1.0000 mg | ORAL_TABLET | Freq: Every day | ORAL | Status: DC
  Administered 2012-08-16 – 2012-08-17 (×2): 1 mg via ORAL
  Filled 2012-08-16 (×2): qty 1

## 2012-08-16 MED ORDER — ASPIRIN EC 81 MG PO TBEC
81.0000 mg | DELAYED_RELEASE_TABLET | Freq: Every day | ORAL | Status: DC
  Administered 2012-08-17: 81 mg via ORAL
  Filled 2012-08-16: qty 1

## 2012-08-16 MED ORDER — PIPERACILLIN-TAZOBACTAM 3.375 G IVPB
3.3750 g | Freq: Three times a day (TID) | INTRAVENOUS | Status: DC
  Administered 2012-08-16 – 2012-08-17 (×3): 3.375 g via INTRAVENOUS
  Filled 2012-08-16 (×5): qty 50

## 2012-08-16 MED ORDER — VANCOMYCIN HCL IN DEXTROSE 750-5 MG/150ML-% IV SOLN
750.0000 mg | Freq: Three times a day (TID) | INTRAVENOUS | Status: DC
  Administered 2012-08-16 – 2012-08-17 (×3): 750 mg via INTRAVENOUS
  Filled 2012-08-16 (×5): qty 150

## 2012-08-16 MED ORDER — LISINOPRIL 10 MG PO TABS
10.0000 mg | ORAL_TABLET | Freq: Every day | ORAL | Status: DC
  Administered 2012-08-17: 10 mg via ORAL
  Filled 2012-08-16 (×2): qty 1

## 2012-08-16 MED ORDER — NORTRIPTYLINE HCL 25 MG PO CAPS
75.0000 mg | ORAL_CAPSULE | Freq: Every day | ORAL | Status: DC
  Administered 2012-08-16: 75 mg via ORAL
  Filled 2012-08-16 (×2): qty 3

## 2012-08-16 MED ORDER — MUPIROCIN CALCIUM 2 % EX CREA
TOPICAL_CREAM | Freq: Every day | CUTANEOUS | Status: DC
  Administered 2012-08-17: 09:00:00 via TOPICAL
  Filled 2012-08-16: qty 15

## 2012-08-16 MED ORDER — ONDANSETRON HCL 4 MG PO TABS: 4.0000 mg | ORAL_TABLET | Freq: Four times a day (QID) | ORAL | Status: DC | PRN

## 2012-08-16 MED ORDER — FILGRASTIM 300 MCG/ML IJ SOLN
300.0000 ug | Freq: Once | INTRAVENOUS | Status: AC
  Administered 2012-08-16: 300 ug via INTRAVENOUS
  Filled 2012-08-16: qty 1

## 2012-08-16 MED ORDER — CLOPIDOGREL BISULFATE 75 MG PO TABS
75.0000 mg | ORAL_TABLET | Freq: Every day | ORAL | Status: DC
  Administered 2012-08-17: 75 mg via ORAL
  Filled 2012-08-16: qty 1

## 2012-08-16 MED ORDER — TAMSULOSIN HCL 0.4 MG PO CAPS
0.4000 mg | ORAL_CAPSULE | Freq: Every day | ORAL | Status: DC
  Filled 2012-08-16 (×2): qty 1

## 2012-08-16 MED ORDER — POTASSIUM CHLORIDE CRYS ER 20 MEQ PO TBCR
40.0000 meq | EXTENDED_RELEASE_TABLET | ORAL | Status: AC
  Administered 2012-08-16: 40 meq via ORAL
  Filled 2012-08-16 (×2): qty 2

## 2012-08-16 MED ORDER — ATORVASTATIN CALCIUM 20 MG PO TABS
20.0000 mg | ORAL_TABLET | Freq: Every day | ORAL | Status: DC
  Administered 2012-08-16: 20 mg via ORAL
  Filled 2012-08-16 (×2): qty 1

## 2012-08-16 MED ORDER — BUDESONIDE-FORMOTEROL FUMARATE 160-4.5 MCG/ACT IN AERO
2.0000 | INHALATION_SPRAY | Freq: Two times a day (BID) | RESPIRATORY_TRACT | Status: DC | PRN
  Filled 2012-08-16: qty 6

## 2012-08-16 MED ORDER — ACETAMINOPHEN 325 MG PO TABS: 650.0000 mg | ORAL_TABLET | Freq: Four times a day (QID) | ORAL | Status: DC | PRN

## 2012-08-16 MED ORDER — ACETAMINOPHEN 650 MG RE SUPP: 650.0000 mg | Freq: Four times a day (QID) | RECTAL | Status: DC | PRN

## 2012-08-16 MED ORDER — PROCHLORPERAZINE MALEATE 10 MG PO TABS
10.0000 mg | ORAL_TABLET | Freq: Four times a day (QID) | ORAL | Status: DC | PRN
  Filled 2012-08-16: qty 1

## 2012-08-16 NOTE — H&P (Signed)
[facility] History and Physical  GHASSAN COGGESHALL ZOX:[id number] DOB: 08/26/1950 DOA: 08/16/2012  Referring physician: Dr. Amador Cunas PCP: [name], MD  Specialists: Dr. [name] (oncologist)  Chief Complaint: left foot cellulitis and non-healing ulcer despite outpatient therapy  HPI: [name] is a 62 y.o. male with PMH significant for BPH, HLD, PAD (status post bilateral stents), lung cancer (on active chemotherapy and subsequent anemia and neutropenia), HTN and COPD; came as direct admission from PCP office due to non-healing left foot ulcer, erythema and swelling. Patient has been treated with 10 days of keflex and despite tx he continue to experience swelling, pain, erythema and discharge from his plantar left ulcer. PCP concern for early systemic infection given hx of immunosuppression and neutropenic status. Admitted to telemetry bed as patient was tachycardic. Denies CP, SOB, Fever, chills, dysuria, nausea, vomiting, abdominal pain or any other complaints.     Review of Systems:  Negative except as otherwise mentioned on HPI  Past Medical History  Diagnosis Date  . BENIGN PROSTATIC HYPERTROPHY 12/24/2009  . CEREBROVASCULAR ACCIDENT, HX OF 12/24/2009  . HYPERLIPIDEMIA 12/24/2009  . HYPERTENSION 12/24/2009  . Testosterone deficiency   . ED (erectile dysfunction)   . Claudication in peripheral vascular disease, lifestyle limiting, with abnormal arterial dopplers of lower ext. 06/30/2011  . S/P angioplasty with stent to Lt. SFA 06/30/11 06/30/2011  . COPD 12/24/2009  . Shortness of breath     "from the COPD" (10/13/2011)  . Stroke 1996    "right side not as strong as left since; numbness R leg/foot" (10/13/2011)  . Pernicious anemia    Past Surgical History  Procedure Laterality Date  . Cervical fusion  1980's    "total of 3-4 OR's on my neck; pinched nerve" (10/13/2011)  . Aneurysm coiling  2007  . Peripheral arterial stent graft  06/30/2010; 10/13/10    left;  left  . Video bronchoscopy Bilateral 04/03/2012    Procedure: VIDEO BRONCHOSCOPY WITHOUT FLUORO;  Surgeon: Nyoka Cowden, MD;  Location: [facility];  Service: Cardiopulmonary;  Laterality: Bilateral;   Social History:  reports that he has been smoking Cigarettes.  He has a 45 pack-year smoking history. He has never used smokeless tobacco. He reports that he does not drink alcohol or use illicit drugs.  No Known Allergies  Family History  Problem Relation Age of Onset  . Emphysema Father     smoked  . Emphysema Paternal Uncle     smoked  . Heart disease Mother   . Lung cancer Father     smoked  . Lung cancer Paternal Grandfather     smoked    Prior to Admission medications   Medication Sig Start Date End Date Taking? Authorizing Provider  acetaminophen (TYLENOL) 500 MG tablet Take 1,000 mg by mouth every 6 (six) hours as needed for pain.    Yes Historical Provider, MD  aspirin 81 MG tablet Take 81 mg by mouth daily.   Yes Historical Provider, MD  atorvastatin (LIPITOR) 20 MG tablet Take 20 mg by mouth daily.   Yes Historical Provider, MD  budesonide-formoterol (SYMBICORT) 160-4.5 MCG/ACT inhaler Inhale 2 puffs into the lungs 2 (two) times daily as needed (for shortness of breath).   Yes Historical Provider, MD  clopidogrel (PLAVIX) 75 MG tablet Take 75 mg by mouth daily. 01/12/12  Yes Historical Provider, MD  gabapentin (NEURONTIN) 600 MG tablet Take 600 mg by mouth every morning.    Yes Historical Provider, MD  nortriptyline (PAMELOR) 75 MG  capsule Take 75 mg by mouth at bedtime.   Yes Historical Provider, MD  prochlorperazine (COMPAZINE) 10 MG tablet Take 10 mg by mouth every 6 (six) hours as needed (for nausea).   Yes Historical Provider, MD  tamsulosin (FLOMAX) 0.4 MG CAPS Take by mouth.   Yes Historical Provider, MD  lisinopril (PRINIVIL,ZESTRIL) 20 MG tablet Take 20 mg by mouth daily.    Historical Provider, MD   Physical Exam: Filed Vitals:   08/16/12 1022  BP: 132/69   Pulse: 104  Temp: 97.4 F (36.3 C)  TempSrc: Oral  Resp: 22  Height: 5\' 6"  (1.676 m)  Weight: 63.957 kg (141 lb)  SpO2: 98%     General:  Afebrile, NAD; reports left foot pain; cooperative to examination. AAOX3  Eyes: PERRL, EOMI, no icterus  ENT: no erythema or exudates inside his mouth, no thrush, no drainage out of ears or nostrils  Neck: no JVD, supple  Cardiovascular: tachycardic, no rubs, no gallops  Respiratory:no crackles, no wheezing; scattered rhonchi  Abdomen: soft, NT, ND, positive BS  Skin: mild erythema and open lateral plantar ulcer; no petechiae  Musculoskeletal: mild LLE pedal edema; otherwise no edema, no cyanosis, no clubbing  Psychiatric: stable.  Neurologic: no focal deficit appreciated on exam  Labs on Admission:  Basic Metabolic Panel:  Recent Labs Lab 08/15/12 1117  NA 135*  K 3.4*  CO2 25  GLUCOSE 115  BUN 7.1  CREATININE 0.7  CALCIUM 9.0   Liver Function Tests:  Recent Labs Lab 08/15/12 1117  AST 11  ALT 6  ALKPHOS 134  BILITOT 0.24  PROT 7.0  ALBUMIN 2.9*   CBC:  Recent Labs Lab 08/15/12 1117  WBC 2.7 Repeated and Verified*  NEUTROABS 0.3 Repeated and Verified*  HGB 8.3 Repeated and Verified*  HCT 24.2*  MCV 92.9  PLT 187    Assessment/Plan 1-Foot ulcer, left and cellulitis: patient with left foot ulcer and cellulitis, has hx of PAD and  lung cancer with neutropenia/immunosuppression given ongoing hx of chemotherapy. -will start tx with vanc and zosyn -watch on telemetry given tachycardia on admission -ask for wound care consult -check foot x-ray to evaluate bone involvement -follow clinical response  2-HYPERTENSION: stable. Will continue current medication regimen  3-COPD:no SOB or wheezing. -continue symbicort  4-BENIGN PROSTATIC HYPERTROPHY: continue flomax  5-Lung cancer: per oncology  6-Neutropenia and anemia: secondary to chemotherapy. -will follow Dr. [name] rec's -will continue neupogen  shot today (was supposed to get 3 consecutive shots starting on 7/31) -will order cbc with diff  7-Hypokalemia: will replete and will check Mg level  8-PAD (peripheral artery disease):continue ASA and plavix. On Physical Exam foot with good capillary response and not coldness to touch.  9-Tachycardia: secondary to mild dehydration/infection. -will monitor on telemetry -provide IVF's -check TSH  10-HLD: continue statins  DVT: lovenox   Dr. [name] (oncology)  Code Status: Full Family Communication: wife at bedside Disposition Plan: Telemetry, LOS > 2 midnights, inpatient  Time spent: 55 minutes  Allis Quirarte [facility] Pager [phone]  If 7PM-7AM, please contact night-coverage [email or web address] Password [email or web address] 08/16/2012, 1:08 PM

## 2012-08-16 NOTE — Consult Note (Signed)
WOC consult Note Reason for Consult: Consult requested for left foot wound. Wound type: Full thickness wound to left outer plantar foot.  Wife states that dry callous area evolved into an open wound with mod amt tan drainage starting last Sunday and is very painful. Measurement: .8X.8X.2cm Wound bed:90% red, 10% yellow Drainage (amount, consistency, odor) Currently there is a small amt yellow drainage on previous dressing, no odor. Periwound: Erythremia surrounding woundbed to .2cm Dressing procedure/placement/frequency: Bactroban to provide antimicrobial benefits.  X-ray results pending.  No wound culture available. Please re-consult if further assistance is needed.  Thank-you,  Cammie Mcgee MSN, RN, CWOCN, [place], CNS [phone]

## 2012-08-16 NOTE — Progress Notes (Signed)
ANTIBIOTIC CONSULT NOTE - INITIAL  Pharmacy Consult for Vancomycin, Zosyn Indication: cellulitis and left foot ulcer  No Known Allergies  Patient Measurements: Height: 5\' 6"  (167.6 cm) Weight: 141 lb (63.957 kg) IBW/kg (Calculated) : 63.8  Vital Signs: Temp: 97.4 F (36.3 C) (08/01 1022) Temp src: Oral (08/01 1022) BP: 132/69 mmHg (08/01 1022) Pulse Rate: 104 (08/01 1022)  Labs:  Recent Labs  08/15/12 1117 08/15/12 1117  WBC 2.7 Repeated and Verified*  --   HGB 8.3 Repeated and Verified*  --   PLT 187  --   CREATININE  --  0.7   Estimated Creatinine Clearance: 85.3 ml/min (by C-G formula based on Cr of 0.7).  Microbiology: No results found for this or any previous visit (from the past 720 hour(s)).  Medical History: Past Medical History  Diagnosis Date  . BENIGN PROSTATIC HYPERTROPHY 12/24/2009  . CEREBROVASCULAR ACCIDENT, HX OF 12/24/2009  . HYPERLIPIDEMIA 12/24/2009  . HYPERTENSION 12/24/2009  . Testosterone deficiency   . ED (erectile dysfunction)   . Claudication in peripheral vascular disease, lifestyle limiting, with abnormal arterial dopplers of lower ext. 06/30/2011  . S/P angioplasty with stent to Lt. SFA 06/30/11 06/30/2011  . COPD 12/24/2009  . Shortness of breath     "from the COPD" (10/13/2011)  . Stroke 1996    "right side not as strong as left since; numbness R leg/foot" (10/13/2011)  . 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  He was noted to have a left foot ulcer at his chemotherapy infusion on 7/26 and chemotherapy was delayed.  He was started on PO Keflex at that time.  He is admitted to [facility] on 8/1 with foot ulcer and neutropenia.  Pharmacy is asked to dose Vancomycin and Zosyn.  SCr 0.7, CrCl ~ 85 ml/min CG  WBC 2.7, ANC 0.3 (filgrastim due 8/1)  Tm: afebrile  Foot Xray pending.  Goal of  Therapy:  Vancomycin trough level 15-20 mcg/ml Appropriate abx dosing, eradication of infection.   Plan:   Zosyn 3.375g IV Q8H infused over 4hrs.   Vancomycin 750mg  IV q8h.  Measure Vanc trough at steady state.  Follow up renal fxn and culture results.   Lynann Beaver PharmD, BCPS Pager [phone] 08/16/2012 1:16 PM

## 2012-08-16 NOTE — Progress Notes (Signed)
Subjective:    Patient ID: [name], male    DOB: Aug 12, 1950, 62 y.o.   MRN: [id number]  HPI  62 year old patient who has a history of metastatic lung cancer and neutropenia secondary to chemotherapy. He also has a history of advanced peripheral vascular disease  and underwent procedures and 2012 and 2013.  He has had a ulcer involving the plantar aspect of his left lateral foot for 10 or 11 days. He has completed antibiotic therapy yesterday that included 2 g of cephalexin and divided dosages. Over the past day he is a worsening pain swelling and erythema involving the dorsal aspect of the left foot.   He complains of significant discomfort to the point where he has a difficult time ambulating. Denies any fever or chills but presents to the office with a resting tachycardia   Past Medical History  Diagnosis Date  . BENIGN PROSTATIC HYPERTROPHY 12/24/2009  . CEREBROVASCULAR ACCIDENT, HX OF 12/24/2009  . HYPERLIPIDEMIA 12/24/2009  . HYPERTENSION 12/24/2009  . Testosterone deficiency   . ED (erectile dysfunction)   . Claudication in peripheral vascular disease, lifestyle limiting, with abnormal arterial dopplers of lower ext. 06/30/2011  . S/P angioplasty with stent to Lt. SFA 06/30/11 06/30/2011  . COPD 12/24/2009  . Shortness of breath     "from the COPD" (10/13/2011)  . Stroke 1996    "right side not as strong as left since; numbness R leg/foot" (10/13/2011)  . Pernicious anemia     History   Social History  . Marital Status: Married    Spouse Name: N/A    Number of Children: 3  . Years of Education: N/A   Occupational History  . Retired     Airline pilot   Social History Main Topics  . Smoking status: Current Every Day Smoker -- 1.00 packs/day for 45 years    Types: Cigarettes  . Smokeless tobacco: Never Used  . Alcohol Use: No  . Drug Use: No  . Sexually Active: Not Currently   Other Topics Concern  . Not on file   Social History Narrative  . No narrative on file    Past  Surgical History  Procedure Laterality Date  . Cervical fusion  1980's    "total of 3-4 OR's on my neck; pinched nerve" (10/13/2011)  . Aneurysm coiling  2007  . Peripheral arterial stent graft  06/30/2010; 10/13/10    left; left  . Video bronchoscopy Bilateral 04/03/2012    Procedure: VIDEO BRONCHOSCOPY WITHOUT FLUORO;  Surgeon: Nyoka Cowden, MD;  Location: [facility];  Service: Cardiopulmonary;  Laterality: Bilateral;    Family History  Problem Relation Age of Onset  . Emphysema Father     smoked  . Emphysema Paternal Uncle     smoked  . Heart disease Mother   . Lung cancer Father     smoked  . Lung cancer Paternal Grandfather     smoked    No Known Allergies  Current Outpatient Prescriptions on File Prior to Visit  Medication Sig Dispense Refill  . acetaminophen (TYLENOL) 500 MG tablet Take 1,000 mg by mouth every 6 (six) hours as needed. For pain      . aspirin 81 MG tablet Take 81 mg by mouth daily.      Marland Kitchen atorvastatin (LIPITOR) 20 MG tablet Take 20 mg by mouth daily.      . budesonide-formoterol (SYMBICORT) 160-4.5 MCG/ACT inhaler Inhale 2 puffs into the lungs 2 (two) times daily.  1 Inhaler  12  .  clopidogrel (PLAVIX) 75 MG tablet Take 75 mg by mouth daily.      Marland Kitchen gabapentin (NEURONTIN) 600 MG tablet Take 600 mg by mouth every morning.       Marland Kitchen lisinopril (PRINIVIL,ZESTRIL) 20 MG tablet Take 20 mg by mouth daily.      . nortriptyline (PAMELOR) 75 MG capsule Take 75 mg by mouth at bedtime.      . prochlorperazine (COMPAZINE) 10 MG tablet Take 1 tablet (10 mg total) by mouth every 6 (six) hours as needed.  60 tablet  0  . tamsulosin (FLOMAX) 0.4 MG CAPS Take by mouth.       No current facility-administered medications on file prior to visit.    BP 150/70  Pulse 119  Temp(Src) 98.6 F (37 C) (Oral)  Resp 22  Wt 141 lb (63.957 kg)  BMI 22.77 kg/m2  SpO2 97%       Review of Systems  Constitutional: Negative for fever, chills, appetite change and fatigue.   HENT: Negative for hearing loss, ear pain, congestion, sore throat, trouble swallowing, neck stiffness, dental problem, voice change and tinnitus.   Eyes: Negative for pain, discharge and visual disturbance.  Respiratory: Negative for cough, chest tightness, wheezing and stridor.   Cardiovascular: Negative for chest pain, palpitations and leg swelling.  Gastrointestinal: Negative for nausea, vomiting, abdominal pain, diarrhea, constipation, blood in stool and abdominal distention.  Genitourinary: Negative for urgency, hematuria, flank pain, discharge, difficulty urinating and genital sores.  Musculoskeletal: Negative for myalgias, back pain, joint swelling, arthralgias and gait problem.  Skin: Positive for rash and wound.  Neurological: Negative for dizziness, syncope, speech difficulty, weakness, numbness and headaches.  Hematological: Negative for adenopathy. Does not bruise/bleed easily.  Psychiatric/Behavioral: Negative for behavioral problems and dysphoric mood. The patient is not nervous/anxious.        Objective:   Physical Exam  Constitutional: He appears well-developed and well-nourished. No distress.  Pulse 120 Afebrile  Musculoskeletal:  Pedal pulses are nonpalpable Approximately 1.5 cm superficial ulcer involving the left plantar lateral foot a few cm proximal to the left fifth MTP joint The dorsum of the left foot is erythematous and warm to touch          Assessment & Plan:    cellulitis left foot. Unresponsive to outpatient antibiotic therapy in a patient with neutropenia and PAD. We'll arrange hospital admission Metastatic lung cancer PA D. Hypertension

## 2012-08-16 NOTE — Patient Instructions (Addendum)
Report to the hospital immediately for inpatient admission

## 2012-08-17 ENCOUNTER — Ambulatory Visit: Payer: Medicare Other

## 2012-08-17 DIAGNOSIS — N400 Enlarged prostate without lower urinary tract symptoms: Secondary | ICD-10-CM

## 2012-08-17 DIAGNOSIS — C3490 Malignant neoplasm of unspecified part of unspecified bronchus or lung: Secondary | ICD-10-CM

## 2012-08-17 LAB — BASIC METABOLIC PANEL
BUN: 5 mg/dL — ABNORMAL LOW (ref 6–23)
CO2: 26 mEq/L (ref 19–32)
Chloride: 99 mEq/L (ref 96–112)
Creatinine, Ser: 0.59 mg/dL (ref 0.50–1.35)
Glucose, Bld: 93 mg/dL (ref 70–99)
Potassium: 3.8 mEq/L (ref 3.5–5.1)

## 2012-08-17 LAB — CBC
HCT: 24.6 % — ABNORMAL LOW (ref 39.0–52.0)
Hemoglobin: 8.2 g/dL — ABNORMAL LOW (ref 13.0–17.0)
MCV: 92.1 fL (ref 78.0–100.0)
RBC: 2.67 MIL/uL — ABNORMAL LOW (ref 4.22–5.81)
RDW: 20 % — ABNORMAL HIGH (ref 11.5–15.5)
WBC: 11.2 10*3/uL — ABNORMAL HIGH (ref 4.0–10.5)

## 2012-08-17 MED ORDER — GABAPENTIN 300 MG PO CAPS
600.0000 mg | ORAL_CAPSULE | Freq: Every day | ORAL | Status: DC
  Administered 2012-08-17: 600 mg via ORAL
  Filled 2012-08-17: qty 2

## 2012-08-17 MED ORDER — DOXYCYCLINE HYCLATE 100 MG PO TABS: 100.0000 mg | ORAL_TABLET | Freq: Two times a day (BID) | ORAL | Status: AC

## 2012-08-17 MED ORDER — ENSURE COMPLETE PO LIQD: 237.0000 mL | Freq: Two times a day (BID) | ORAL | Status: DC

## 2012-08-17 MED ORDER — MUPIROCIN CALCIUM 2 % EX CREA: TOPICAL_CREAM | Freq: Every day | CUTANEOUS | Status: DC

## 2012-08-17 MED ORDER — MAGNESIUM SULFATE 40 MG/ML IJ SOLN
2.0000 g | Freq: Once | INTRAMUSCULAR | Status: AC
  Administered 2012-08-17 (×2): 2 g via INTRAVENOUS
  Filled 2012-08-17: qty 50

## 2012-08-17 NOTE — Discharge Summary (Signed)
Physician Discharge Summary  [name] ZOX:[id number] DOB: 03-22-1950 DOA: 08/16/2012  PCP: Frank Boga, MD  Admit date: 08/16/2012 Discharge date: 08/17/2012  Time spent: >30 minutes  Recommendations for Outpatient Follow-up:  BMET and Mg level to follow electrolytes and kidney function CBC to follow WBC's and Hgb level  Discharge Diagnoses:  Principal Problem:   Foot ulcer, left Active Problems:   HYPERTENSION   COPD   BENIGN PROSTATIC HYPERTROPHY   Lung cancer   Neutropenia   PAD (peripheral artery disease)   Discharge Condition: stable and improved. Will discharge home with instructions for wound care; doxycycline for 10 days and follow up with vascular surgery and PCP.  Diet recommendation: heart healthy diet  Filed Weights   08/16/12 1022  Weight: 63.957 kg (141 lb)    History of present illness:  62 y.o. male with PMH significant for BPH, HLD, PAD (status post bilateral stents), lung cancer (on active chemotherapy and subsequent anemia and neutropenia), HTN and COPD; came as direct admission from PCP office due to non-healing left foot ulcer, erythema and swelling. Patient has been treated with 10 days of keflex and despite tx he continue to experience swelling, pain, erythema and discharge from his plantar left ulcer. PCP concern for early systemic infection given hx of immunosuppression and neutropenic status. Admitted to telemetry bed as patient was tachycardic.  Denies CP, SOB, Fever, chills, dysuria, nausea, vomiting, abdominal pain or any other complaints.    Hospital Course:  1-Foot ulcer, left and cellulitis: patient with left foot ulcer and cellulitis, has hx of PAD and lung cancer with neutropenia/immunosuppression given ongoing hx of chemotherapy.  -received 2 days of IV antibiotics -erythema and swelling are now pretty much resolved; patient is going to be discharged on doxycycline to complete 10 days treatment and instructions for wound  care. -At discharge after neupogen his WBC's were 11.1 -foot x-ray demonstrated no bone involvement  2-HYPERTENSION: stable. Will continue current medication regimen and low sodium diet  3-COPD: no SOB or wheezing.  -continue symbicort   4-BENIGN PROSTATIC HYPERTROPHY: continue flomax   5-Lung cancer: per oncology   6-Neutropenia and anemia: secondary to chemotherapy.  -will continue following with Dr. [name] as an outpatient  [facility] now 11.1 -Hgb stable  7-Hypokalemia and hypomagnesemia: repleted.  8-PAD (peripheral artery disease):continue ASA and plavix. On Physical Exam foot with good capillary response and not coldness to touch.  -patient will follow with vascular surgery for further evaluation as an outpatient  9-Tachycardia: secondary to mild dehydration/infection.  -no abnormalities on telemetry -after IVF's given; patient HR normalizes  -encourage to keep himself well hydrated  10-HLD: continue statins   11-hypoalbuminemia: started on ensure BID.   Procedures:  See below for x-ray  Consultations:  Wound care service  Discharge Exam: Filed Vitals:   08/16/12 1356 08/16/12 2200 08/17/12 0228 08/17/12 0525  BP: 124/70 123/71 141/80 124/61  Pulse: 97 102 115 106  Temp: 97.9 F (36.6 C) 98.8 F (37.1 C) 98.9 F (37.2 C) 98.2 F (36.8 C)  TempSrc: Oral Oral Oral Oral  Resp: 20 20 20    Height:      Weight:      SpO2: 97% 94% 93% 93%    General: NAD, afebrile; feeling better and today w/o significant swelling or pain on his left foot Cardiovascular: S1 and S2, no rubs or gallops Respiratory: CTA bilaterally Abd:no tenderness, no distension, positive BS; soft and no guarding Extremities: clean dressings on left plantar callosus ulcer; no significant erythema,  pain or swelling.  Neuro: non focal  Discharge Instructions  Discharge Orders   Future Appointments Provider Department Dept Phone   08/22/2012 11:45 AM Delcie Roch [facility] MEDICAL ONCOLOGY [phone]   08/22/2012 12:15 PM Marlana Salvage [facility] MED[facility] [phone]   08/22/2012 1:00 PM [facility] MEDICAL ONCOL[facility]Y [phone]   08/29/2012 11:00 AM Windell Hummingbird [facility] MEDICAL ONCOLOGY [phone]   08/29/2012 11:30 AM [facility] MEDICAL ONCOLOGY [phone]   09/05/2012 11:45 AM Windell Hummingbird [facility] MEDICAL ONCOLOGY [phone]   09/05/2012 12:15 PM [facility] MEDICAL ONCOL[facility]Y [phone]   12/06/2012 8:30 AM Gordy Savers, MD [facility] [phone]   Future Orders Complete By Expires     Diet - low sodium heart healthy  As directed     Discharge instructions  As directed     Comments:      Keep yourself well hydrated Take medications as prescribed. Apply Bactroban to left outer foot wound Q day.  Cover with 2X2 dry gauze and secure wit tape Arrange follow up with PCP in 10 days Arrange follow up with vascular surgery for further evaluation on your PAD as instructed Follow up with Dr. [name] as previously scheduled Wear comfortable shoes at all time and try as much as possible to remove pressure/weight on affected area.        Medication List         acetaminophen 500 MG tablet  Commonly known as:  TYLENOL  Take 1,000 mg by mouth every 6 (six) hours as needed for pain.     aspirin 81 MG tablet  Take 81 mg by mouth daily.     atorvastatin 20 MG tablet  Commonly known as:  LIPITOR  Take 20 mg by mouth daily.     budesonide-formoterol 160-4.5 MCG/ACT inhaler  Commonly known as:  SYMBICORT  Inhale 2 puffs into the lungs 2 (two) times daily as needed (for shortness of breath).     clopidogrel 75 MG tablet  Commonly known as:  PLAVIX  Take 75 mg by mouth daily.     doxycycline 100 MG tablet  Commonly known as:  VIBRA-TABS  Take 1 tablet  (100 mg total) by mouth 2 (two) times daily.     feeding supplement Liqd  Take 237 mLs by mouth 2 (two) times daily between meals.     gabapentin 600 MG tablet  Commonly known as:  NEURONTIN  Take 600 mg by mouth every morning.     lisinopril 20 MG tablet  Commonly known as:  PRINIVIL,ZESTRIL  Take 20 mg by mouth daily.     mupirocin cream 2 %  Commonly known as:  BACTROBAN  Apply topically daily. Onto left plantar wound     nortriptyline 75 MG capsule  Commonly known as:  PAMELOR  Take 75 mg by mouth at bedtime.     prochlorperazine 10 MG tablet  Commonly known as:  COMPAZINE  Take 10 mg by mouth every 6 (six) hours as needed (for nausea).     tamsulosin 0.4 MG Caps  Commonly known as:  FLOMAX  Take by mouth.       No Known Allergies     Follow-up Information   Follow up with Frank Boga, MD. Schedule an appointment as soon as possible for a visit in 10 days.  Contact information:   [address] Christena Flake [place] [address] [phone]        The results of significant diagnostics from this hospitalization (including imaging, microbiology, ancillary and laboratory) are listed below for reference.    Significant Diagnostic Studies: Dg Foot Complete Left  08/16/2012   *RADIOLOGY REPORT*  Clinical Data: Soft tissue ulceration of the plantar aspect of the left foot.  LEFT FOOT - COMPLETE 3+ VIEW  Comparison: None.  Findings: Osseous structures of the foot appear normal.  There is no bone destruction, erosion, fracture, or periosteal reaction.  No arthritic changes.  IMPRESSION: Normal exam.  No evidence of osteomyelitis or other abnormality.   Original Report Authenticated By: Francene Boyers, M.D.   Labs: Basic Metabolic Panel:  Recent Labs Lab 08/15/12 1117 08/16/12 1359 08/17/12 0520  NA 135* 133* 134*  K 3.4* 3.6 3.8  CL  --  97 99  CO2 25 28 26   GLUCOSE 115 101* 93  BUN 7.1 7 5*  CREATININE 0.7 0.61 0.59  CALCIUM 9.0 9.1 9.0  MG  --   1.0*  --    Liver Function Tests:  Recent Labs Lab 08/15/12 1117 08/16/12 1359  AST 11 9  ALT 6 7  ALKPHOS 134 128*  BILITOT 0.24 0.2*  PROT 7.0 6.7  ALBUMIN 2.9* 2.9*   CBC:  Recent Labs Lab 08/15/12 1117 08/16/12 1359 08/17/12 0520  WBC 2.7 Repeated and Verified* 7.5 11.2*  NEUTROABS 0.3 Repeated and Verified* 3.9  --   HGB 8.3 Repeated and Verified* 8.4* 8.2*  HCT 24.2* 25.0* 24.6*  MCV 92.9 91.6 92.1  PLT 187 159 168   Signed:  Velda Wendt  [facility] 08/17/2012, 10:18 AM

## 2012-08-17 NOTE — Care Management Note (Signed)
    Page 1 of 1   08/17/2012     3:52:43 PM   CARE MANAGEMENT NOTE 08/17/2012  Patient:  [name], [name]   Account Number:  [id number]  Date Initiated:  08/17/2012  Documentation initiated by:  [facility]  Subjective/Objective Assessment:   ADMITTED W/L FOOT ULCER.     Action/Plan:   FROM HOME W/SPOUSE.   Anticipated DC Date:  08/17/2012   Anticipated DC Plan:  HOME/SELF CARE      DC Planning Services  CM consult      Choice offered to / List presented to:             Status of service:  Completed, signed off Medicare Important Message given?   (If response is "NO", the following Medicare IM given date fields will be blank) Date Medicare IM given:   Date Additional Medicare IM given:    Discharge Disposition:  HOME/SELF CARE  Per UR Regulation:  Reviewed for med. necessity/level of care/duration of stay  If discussed at Long Length of Stay Meetings, dates discussed:    Comments:  08/17/12 Hesston Hitchens RN,BSN NCM WEEKEND [phone] NO ORDERS OR NEEDS.

## 2012-08-17 NOTE — Progress Notes (Signed)
Patient and spouse has been provided discharge instructions, prescriptions, and dressing supplies. Follow up appointments are in place and have been discussed. Patient verbalizes understanding of discharge instructions and teaching.

## 2012-08-19 ENCOUNTER — Telehealth: Payer: Self-pay | Admitting: Internal Medicine

## 2012-08-19 NOTE — Telephone Encounter (Signed)
[facility] Triage Call Report Triage Record Num: [id number] Operator: Chevis Pretty Patient Name: [name] Call Date & Time: 08/18/2012 12:35:21PM Patient Phone: [phone] PCP: Gordy Savers Patient Gender: Male PCP Fax : [phone] Patient DOB: 08/07/1950 Practice Name: [facility]  Reason for Call: Caller: Cynthia/Spouse; PCP: Eleonore Chiquito [facility]); CB#: [phone]; Call regarding Foot ulcer that was infected - pt was just released from hospital and wants pain meds. States was discharged 08/17/12 PM, and was given Rx for antibiotic x 10 days, but nothing for pain. States the area is very tender and is unable to walk without severe pain. Foot ulcer looks much improved than when seen in office 08/16/12. Denies symptoms of increasing infection. Afebrile. Per standing orders, Rx for tramedol 50mg , 1-2 po q 4-6 h prn pain, #6, NR called in to [facility] [phone]. Patient was told to schedule appt in 10 days for followup; advised to call for appt if pain not improved within 24 hours. Patient verbalized understanding.  Protocol(s) Used: Medication Questions - Adult  Recommended Outcome per Protocol: Provided Health Information  Reason for Outcome: Caller has medication question(s) that was answered with available resources

## 2012-08-21 ENCOUNTER — Emergency Department (HOSPITAL_COMMUNITY): Payer: Medicare Other

## 2012-08-21 ENCOUNTER — Encounter (HOSPITAL_COMMUNITY): Payer: Self-pay | Admitting: Emergency Medicine

## 2012-08-21 ENCOUNTER — Telehealth: Payer: Self-pay | Admitting: Medical Oncology

## 2012-08-21 ENCOUNTER — Inpatient Hospital Stay (HOSPITAL_COMMUNITY)
Admission: EM | Admit: 2012-08-21 | Discharge: 2012-08-29 | DRG: 570 | Disposition: A | Discharge status: Home / Self Care | Payer: Medicare Other | Attending: Internal Medicine | Admitting: Internal Medicine

## 2012-08-21 DIAGNOSIS — D709 Neutropenia, unspecified: Secondary | ICD-10-CM

## 2012-08-21 DIAGNOSIS — J438 Other emphysema: Secondary | ICD-10-CM | POA: Diagnosis present

## 2012-08-21 DIAGNOSIS — L988 Other specified disorders of the skin and subcutaneous tissue: Secondary | ICD-10-CM | POA: Diagnosis present

## 2012-08-21 DIAGNOSIS — L02619 Cutaneous abscess of unspecified foot: Principal | ICD-10-CM | POA: Diagnosis present

## 2012-08-21 DIAGNOSIS — M869 Osteomyelitis, unspecified: Secondary | ICD-10-CM

## 2012-08-21 DIAGNOSIS — L03039 Cellulitis of unspecified toe: Secondary | ICD-10-CM | POA: Diagnosis present

## 2012-08-21 DIAGNOSIS — E871 Hypo-osmolality and hyponatremia: Secondary | ICD-10-CM

## 2012-08-21 DIAGNOSIS — E785 Hyperlipidemia, unspecified: Secondary | ICD-10-CM

## 2012-08-21 DIAGNOSIS — Z7982 Long term (current) use of aspirin: Secondary | ICD-10-CM

## 2012-08-21 DIAGNOSIS — Z95820 Peripheral vascular angioplasty status with implants and grafts: Secondary | ICD-10-CM

## 2012-08-21 DIAGNOSIS — L03116 Cellulitis of left lower limb: Secondary | ICD-10-CM

## 2012-08-21 DIAGNOSIS — A419 Sepsis, unspecified organism: Secondary | ICD-10-CM

## 2012-08-21 DIAGNOSIS — N400 Enlarged prostate without lower urinary tract symptoms: Secondary | ICD-10-CM

## 2012-08-21 DIAGNOSIS — D7582 Heparin induced thrombocytopenia (HIT): Secondary | ICD-10-CM | POA: Diagnosis not present

## 2012-08-21 DIAGNOSIS — J449 Chronic obstructive pulmonary disease, unspecified: Secondary | ICD-10-CM

## 2012-08-21 DIAGNOSIS — E876 Hypokalemia: Secondary | ICD-10-CM

## 2012-08-21 DIAGNOSIS — Z8679 Personal history of other diseases of the circulatory system: Secondary | ICD-10-CM

## 2012-08-21 DIAGNOSIS — L02419 Cutaneous abscess of limb, unspecified: Secondary | ICD-10-CM

## 2012-08-21 DIAGNOSIS — I10 Essential (primary) hypertension: Secondary | ICD-10-CM

## 2012-08-21 DIAGNOSIS — L97529 Non-pressure chronic ulcer of other part of left foot with unspecified severity: Secondary | ICD-10-CM

## 2012-08-21 DIAGNOSIS — Z85118 Personal history of other malignant neoplasm of bronchus and lung: Secondary | ICD-10-CM

## 2012-08-21 DIAGNOSIS — I6350 Cerebral infarction due to unspecified occlusion or stenosis of unspecified cerebral artery: Secondary | ICD-10-CM | POA: Diagnosis present

## 2012-08-21 DIAGNOSIS — R000 Tachycardia, unspecified: Secondary | ICD-10-CM

## 2012-08-21 DIAGNOSIS — T451X5A Adverse effect of antineoplastic and immunosuppressive drugs, initial encounter: Secondary | ICD-10-CM | POA: Diagnosis present

## 2012-08-21 DIAGNOSIS — Z720 Tobacco use: Secondary | ICD-10-CM

## 2012-08-21 DIAGNOSIS — I96 Gangrene, not elsewhere classified: Secondary | ICD-10-CM

## 2012-08-21 DIAGNOSIS — D696 Thrombocytopenia, unspecified: Secondary | ICD-10-CM

## 2012-08-21 DIAGNOSIS — J4489 Other specified chronic obstructive pulmonary disease: Secondary | ICD-10-CM

## 2012-08-21 DIAGNOSIS — Z113 Encounter for screening for infections with a predominantly sexual mode of transmission: Secondary | ICD-10-CM

## 2012-08-21 DIAGNOSIS — C3490 Malignant neoplasm of unspecified part of unspecified bronchus or lung: Secondary | ICD-10-CM | POA: Diagnosis present

## 2012-08-21 DIAGNOSIS — F17200 Nicotine dependence, unspecified, uncomplicated: Secondary | ICD-10-CM | POA: Diagnosis present

## 2012-08-21 DIAGNOSIS — D62 Acute posthemorrhagic anemia: Secondary | ICD-10-CM | POA: Diagnosis present

## 2012-08-21 DIAGNOSIS — I739 Peripheral vascular disease, unspecified: Secondary | ICD-10-CM

## 2012-08-21 DIAGNOSIS — L03119 Cellulitis of unspecified part of limb: Secondary | ICD-10-CM

## 2012-08-21 DIAGNOSIS — D702 Other drug-induced agranulocytosis: Secondary | ICD-10-CM | POA: Diagnosis present

## 2012-08-21 LAB — CBC WITH DIFFERENTIAL/PLATELET
Basophils Absolute: 0 10*3/uL (ref 0.0–0.1)
Basophils Relative: 0 % (ref 0–1)
Eosinophils Relative: 0 % (ref 0–5)
HCT: 22.8 % — ABNORMAL LOW (ref 39.0–52.0)
Hemoglobin: 7.9 g/dL — ABNORMAL LOW (ref 13.0–17.0)
MCH: 31.5 pg (ref 26.0–34.0)
MCHC: 34.6 g/dL (ref 30.0–36.0)
MCV: 90.8 fL (ref 78.0–100.0)
Monocytes Absolute: 1.7 10*3/uL — ABNORMAL HIGH (ref 0.1–1.0)
Monocytes Relative: 14 % — ABNORMAL HIGH (ref 3–12)
Neutro Abs: 9.5 10*3/uL — ABNORMAL HIGH (ref 1.7–7.7)
RDW: 19.6 % — ABNORMAL HIGH (ref 11.5–15.5)

## 2012-08-21 LAB — BASIC METABOLIC PANEL
BUN: 12 mg/dL (ref 6–23)
Calcium: 8.8 mg/dL (ref 8.4–10.5)
Chloride: 89 mEq/L — ABNORMAL LOW (ref 96–112)
Creatinine, Ser: 0.69 mg/dL (ref 0.50–1.35)
GFR calc Af Amer: >90 mL/min (ref >90)

## 2012-08-21 LAB — HEPATIC FUNCTION PANEL
ALT: 8 U/L (ref 0–53)
Alkaline Phosphatase: 128 U/L — ABNORMAL HIGH (ref 39–117)
Bilirubin, Direct: 0.1 mg/dL (ref 0.0–0.3)
Indirect Bilirubin: 0.2 mg/dL — ABNORMAL LOW (ref 0.3–0.9)
Total Protein: 6.8 g/dL (ref 6.0–8.3)

## 2012-08-21 MED ORDER — SODIUM CHLORIDE 0.9 % IV BOLUS (SEPSIS)
1000.0000 mL | Freq: Once | INTRAVENOUS | Status: AC
  Administered 2012-08-21: 1000 mL via INTRAVENOUS

## 2012-08-21 MED ORDER — HYDROMORPHONE HCL PF 2 MG/ML IJ SOLN
2.0000 mg | INTRAMUSCULAR | Status: DC | PRN
  Administered 2012-08-21: 2 mg via INTRAVENOUS
  Filled 2012-08-21: qty 1

## 2012-08-21 MED ORDER — SODIUM CHLORIDE 0.9 % IV SOLN
Freq: Once | INTRAVENOUS | Status: AC
  Administered 2012-08-21: via INTRAVENOUS

## 2012-08-21 NOTE — ED Provider Notes (Signed)
CSN: [id number]     Arrival date & time 08/21/12  2235 History     First MD Initiated Contact with Patient 08/21/12 2248     Chief Complaint  Patient presents with  . Fever   (Consider location/radiation/quality/duration/timing/severity/associated sxs/prior Treatment) HPI  [name] is a 62 y.o. male with past medical history significant for lung cancer (actively undergoing chemotherapy currently getting Neupogen shots), ACS and COPD status post CVA complaining of fever (Tmax 100.0 orally) complaining of worsening appearance of left foot ulcer. Patient was admitted for similar complaint approximately 10 days ago, started on Vanco and Zosyn.  Patient feels that the ulcer and cellulitis to his left foot is worse than it has ever been, his wife agrees that the ulceration in the area of cellulitis is growing. Patient took Tylenol at approximately 6 PM. He denies nausea and vomiting about his baseline, chest pain, shortness of breath. Pt had Neupogen shot last week.   Past Medical History  Diagnosis Date  . BENIGN PROSTATIC HYPERTROPHY 12/24/2009  . CEREBROVASCULAR ACCIDENT, HX OF 12/24/2009  . HYPERLIPIDEMIA 12/24/2009  . HYPERTENSION 12/24/2009  . Testosterone deficiency   . ED (erectile dysfunction)   . Claudication in peripheral vascular disease, lifestyle limiting, with abnormal arterial dopplers of lower ext. 06/30/2011  . S/P angioplasty with stent to Lt. SFA 06/30/11 06/30/2011  . COPD 12/24/2009  . Shortness of breath     "from the COPD" (10/13/2011)  . Stroke 1996    "right side not as strong as left since; numbness R leg/foot" (10/13/2011)  . Pernicious anemia    Past Surgical History  Procedure Laterality Date  . Cervical fusion  1980's    "total of 3-4 OR's on my neck; pinched nerve" (10/13/2011)  . Aneurysm coiling  2007  . Peripheral arterial stent graft  06/30/2010; 10/13/10    left; left  . Video bronchoscopy Bilateral 04/03/2012    Procedure: VIDEO BRONCHOSCOPY WITHOUT  FLUORO;  Surgeon: Nyoka Cowden, MD;  Location: [facility];  Service: Cardiopulmonary;  Laterality: Bilateral;   Family History  Problem Relation Age of Onset  . Emphysema Father     smoked  . Emphysema Paternal Uncle     smoked  . Heart disease Mother   . Lung cancer Father     smoked  . Lung cancer Paternal Grandfather     smoked   History  Substance Use Topics  . Smoking status: Current Every Day Smoker -- 1.00 packs/day for 45 years    Types: Cigarettes  . Smokeless tobacco: Never Used  . Alcohol Use: No    Review of Systems 10 systems reviewed and found to be negative, except as noted in the HPI  Allergies  Review of patient's allergies indicates no known allergies.  Home Medications   Current Outpatient Rx  Name  Route  Sig  Dispense  Refill  . acetaminophen (TYLENOL) 500 MG tablet   Oral   Take 1,000 mg by mouth every 6 (six) hours as needed for pain.          Marland Kitchen aspirin 81 MG tablet   Oral   Take 81 mg by mouth daily.         Marland Kitchen atorvastatin (LIPITOR) 20 MG tablet   Oral   Take 20 mg by mouth daily.         . budesonide-formoterol (SYMBICORT) 160-4.5 MCG/ACT inhaler   Inhalation   Inhale 2 puffs into the lungs 2 (two) times daily as needed (for  shortness of breath).         . clopidogrel (PLAVIX) 75 MG tablet   Oral   Take 75 mg by mouth daily.         Marland Kitchen doxycycline (VIBRA-TABS) 100 MG tablet   Oral   Take 1 tablet (100 mg total) by mouth 2 (two) times daily.   20 tablet   0   . feeding supplement (ENSURE COMPLETE) LIQD   Oral   Take 237 mLs by mouth 2 (two) times daily between meals.         . gabapentin (NEURONTIN) 600 MG tablet   Oral   Take 600 mg by mouth every morning.          Marland Kitchen lisinopril (PRINIVIL,ZESTRIL) 20 MG tablet   Oral   Take 20 mg by mouth daily.         . mupirocin cream (BACTROBAN) 2 %   Topical   Apply topically daily. Onto left plantar wound   15 g   0   . nortriptyline (PAMELOR) 75 MG capsule    Oral   Take 75 mg by mouth at bedtime.         . prochlorperazine (COMPAZINE) 10 MG tablet   Oral   Take 10 mg by mouth every 6 (six) hours as needed (for nausea).         . tamsulosin (FLOMAX) 0.4 MG CAPS   Oral   Take by mouth.          BP 103/59  Pulse 125  Temp(Src) 98 F (36.7 C)  Resp 18  SpO2 93% Physical Exam  Nursing note and vitals reviewed. Constitutional: He is oriented to person, place, and time. He appears well-developed and well-nourished. No distress.  Cachectic, pale  HENT:  Head: Normocephalic.  Mildly dry mucous membranes  Eyes: Conjunctivae and EOM are normal. Pupils are equal, round, and reactive to light.  Cardiovascular: Normal rate, regular rhythm and intact distal pulses.   Pulmonary/Chest: Effort normal and breath sounds normal. No stridor.  Abdominal: Soft. Bowel sounds are normal. He exhibits no distension and no mass. There is no tenderness. There is no rebound and no guarding.  Musculoskeletal: Normal range of motion.  Neurological: He is alert and oriented to person, place, and time.  Skin:  Area of cellulitis extending past the ankle with streaking up the leg.   Ulceration sole of left foot at the fifth MTP  Psychiatric: He has a normal mood and affect.           ED Course   Procedures (including critical care time)  Labs Reviewed  CBC WITH DIFFERENTIAL - Abnormal; Notable for the following:    WBC 12.7 (*)    RBC 2.51 (*)    Hemoglobin 7.9 (*)    HCT 22.8 (*)    RDW 19.6 (*)    Platelets 128 (*)    Neutro Abs 9.5 (*)    Monocytes Relative 14 (*)    Monocytes Absolute 1.7 (*)    All other components within normal limits  BASIC METABOLIC PANEL - Abnormal; Notable for the following:    Sodium 124 (*)    Potassium 3.0 (*)    Chloride 89 (*)    Glucose, Bld 133 (*)    All other components within normal limits  HEPATIC FUNCTION PANEL - Abnormal; Notable for the following:    Albumin 2.5 (*)    Alkaline Phosphatase  128 (*)    Indirect Bilirubin 0.2 (*)  All other components within normal limits  CULTURE, BLOOD (ROUTINE X 2)  CULTURE, BLOOD (ROUTINE X 2)  URINALYSIS, ROUTINE W REFLEX MICROSCOPIC   Dg Chest 1 View  08/21/2012   *RADIOLOGY REPORT*  Clinical Data: Fever  CHEST - 1 VIEW  Comparison: 07/08/2012, 06/14/2012  Findings: Hyperinflation noted compatible with background emphysema.  The bilateral pulmonary nodules are better demonstrated on the CT comparison.  Left mediastinal mass is not well appreciated by plain radiography.  No definite superimposed CHF or pneumonia.  No collapse or consolidation.  No significant effusion or pneumothorax.  Trachea is midline.  IMPRESSION: Stable COPD/emphysema.  Spiculated bilateral pulmonary nodules are better appreciated by CT comparison.  No superimposed acute process   Original Report Authenticated By: Judie Petit. Shick, M.D.   Dg Foot Complete Left  08/21/2012   *RADIOLOGY REPORT*  Clinical Data: Plantar ulceration left foot.  LEFT FOOT - COMPLETE 3+ VIEW  Comparison: Plain films left foot 08/16/2012.  Findings: No soft tissue gas collection or radiopaque foreign body is seen.  No bony destructive change is identified.  Small plantar calcaneal spur is noted.  Skin ulceration is not discretely visualized.  IMPRESSION: Negative exam.   Original Report Authenticated By: Holley Dexter, M.D.   1. Cellulitis of left foot   2. Hyponatremia     MDM   Filed Vitals:   08/21/12 2256 08/22/12 0019  BP: 103/59   Pulse: 125   Temp: 98 F (36.7 C)   Resp: 18   Height:  5\' 6"  (1.676 m)  Weight:  137 lb (62.143 kg)  SpO2: 93%      [name] is a 62 y.o. male lung cancer undergoing chemotherapy reporting fever today of 100.0 an increasing area of cellulitis and ulceration to left foot. X-ray shows no osteomyelitis. White blood cells are 12.7 status post Neupogen shots last week. Patient sodium is low 124 and also potassium is 3.0 both will be repleted. Patient will be  started on vancomycin and Zosyn, blood cultures pending.   Patient will be admitted to [facility] hospitalist Dr. Alvester Morin  Medications  HYDROmorphone (DILAUDID) injection 2 mg (2 mg Intravenous Given 08/21/12 2342)  sodium chloride 0.9 % bolus 500 mL (500 mLs Intravenous Rate/Dose Change 08/22/12 0035)  0.9 %  sodium chloride infusion (not administered)  vancomycin (VANCOCIN) IVPB 1000 mg/200 mL premix (not administered)  piperacillin-tazobactam (ZOSYN) IVPB 3.375 g (3.375 g Intravenous New Bag/Given 08/22/12 0034)  sodium chloride 0.9 % bolus 1,000 mL (0 mLs Intravenous Stopped 08/21/12 2346)  0.9 %  sodium chloride infusion ( Intravenous Stopped 08/22/12 0035)  potassium chloride SA (K-DUR,KLOR-CON) CR tablet 40 mEq (40 mEq Oral Given 08/22/12 0034)    Note: Portions of this report may have been transcribed using voice recognition software. Every effort was made to ensure accuracy; however, inadvertent computerized transcription errors may be present    Wynetta Emery, PA-C 08/22/12 [phone]

## 2012-08-21 NOTE — ED Notes (Signed)
Pt presents to ED with c/o fever--- pt reports having T100.0; pt states he has had his last chemo for lung cancer 2 weeks ago.  Pt also reports "ulcer" to left foot--- states ulcer was treated here but it has been looking "worse" since he left hospital.

## 2012-08-21 NOTE — Telephone Encounter (Signed)
I left a message for the  [name] to keep appts for tomorrow so Dr [name] can look at foot ulcer.

## 2012-08-22 ENCOUNTER — Encounter (HOSPITAL_COMMUNITY): Payer: Self-pay | Admitting: *Deleted

## 2012-08-22 ENCOUNTER — Ambulatory Visit: Payer: Medicare Other | Admitting: Physician Assistant

## 2012-08-22 ENCOUNTER — Ambulatory Visit: Payer: Medicare Other

## 2012-08-22 ENCOUNTER — Other Ambulatory Visit: Payer: Medicare Other | Admitting: Lab

## 2012-08-22 DIAGNOSIS — M7989 Other specified soft tissue disorders: Secondary | ICD-10-CM

## 2012-08-22 DIAGNOSIS — J449 Chronic obstructive pulmonary disease, unspecified: Secondary | ICD-10-CM

## 2012-08-22 DIAGNOSIS — M79609 Pain in unspecified limb: Secondary | ICD-10-CM

## 2012-08-22 LAB — URINALYSIS, ROUTINE W REFLEX MICROSCOPIC
Bilirubin Urine: NEGATIVE
Ketones, ur: NEGATIVE mg/dL
Leukocytes, UA: NEGATIVE
Nitrite: NEGATIVE
Urobilinogen, UA: 0.2 mg/dL (ref 0.0–1.0)

## 2012-08-22 LAB — MAGNESIUM: Magnesium: 0.9 mg/dL — CL (ref 1.5–2.5)

## 2012-08-22 LAB — PREPARE RBC (CROSSMATCH)

## 2012-08-22 MED ORDER — NORTRIPTYLINE HCL 25 MG PO CAPS
75.0000 mg | ORAL_CAPSULE | Freq: Every day | ORAL | Status: DC
  Administered 2012-08-22 – 2012-08-28 (×7): 75 mg via ORAL
  Filled 2012-08-22 (×10): qty 3

## 2012-08-22 MED ORDER — HYDROMORPHONE HCL PF 1 MG/ML IJ SOLN
1.0000 mg | INTRAMUSCULAR | Status: DC | PRN
  Administered 2012-08-22 – 2012-08-24 (×6): 1 mg via INTRAVENOUS
  Filled 2012-08-22 (×7): qty 1

## 2012-08-22 MED ORDER — MAGNESIUM SULFATE 40 MG/ML IJ SOLN
2.0000 g | Freq: Once | INTRAMUSCULAR | Status: AC
  Administered 2012-08-22: 2 g via INTRAVENOUS
  Filled 2012-08-22: qty 50

## 2012-08-22 MED ORDER — SODIUM CHLORIDE 0.9 % IV BOLUS (SEPSIS): 500.0000 mL | Freq: Once | INTRAVENOUS | Status: DC

## 2012-08-22 MED ORDER — SODIUM CHLORIDE 0.9 % IV SOLN: Freq: Once | INTRAVENOUS | Status: DC

## 2012-08-22 MED ORDER — ENSURE COMPLETE PO LIQD
237.0000 mL | Freq: Two times a day (BID) | ORAL | Status: DC
  Administered 2012-08-22 – 2012-08-23 (×3): 237 mL via ORAL
  Filled 2012-08-22 (×8): qty 237

## 2012-08-22 MED ORDER — POTASSIUM CHLORIDE IN NACL 40-0.9 MEQ/L-% IV SOLN
INTRAVENOUS | Status: DC
  Administered 2012-08-22 – 2012-08-25 (×5): via INTRAVENOUS
  Filled 2012-08-22 (×7): qty 1000

## 2012-08-22 MED ORDER — PIPERACILLIN-TAZOBACTAM 3.375 G IVPB
3.3750 g | Freq: Three times a day (TID) | INTRAVENOUS | Status: DC
  Administered 2012-08-22 – 2012-08-23 (×4): 3.375 g via INTRAVENOUS
  Filled 2012-08-22 (×5): qty 50

## 2012-08-22 MED ORDER — ENOXAPARIN SODIUM 40 MG/0.4ML ~~LOC~~ SOLN
40.0000 mg | SUBCUTANEOUS | Status: DC
  Administered 2012-08-22 – 2012-08-25 (×4): 40 mg via SUBCUTANEOUS
  Filled 2012-08-22 (×4): qty 0.4

## 2012-08-22 MED ORDER — ADULT MULTIVITAMIN W/MINERALS CH
1.0000 | ORAL_TABLET | Freq: Every day | ORAL | Status: DC
  Administered 2012-08-22 – 2012-08-29 (×7): 1 via ORAL
  Filled 2012-08-22 (×8): qty 1

## 2012-08-22 MED ORDER — POTASSIUM CHLORIDE CRYS ER 20 MEQ PO TBCR
40.0000 meq | EXTENDED_RELEASE_TABLET | Freq: Once | ORAL | Status: AC
  Administered 2012-08-22: 40 meq via ORAL
  Filled 2012-08-22: qty 2

## 2012-08-22 MED ORDER — ASPIRIN EC 81 MG PO TBEC
81.0000 mg | DELAYED_RELEASE_TABLET | Freq: Every day | ORAL | Status: DC
  Administered 2012-08-22 – 2012-08-26 (×5): 81 mg via ORAL
  Filled 2012-08-22 (×5): qty 1

## 2012-08-22 MED ORDER — PROCHLORPERAZINE MALEATE 10 MG PO TABS
10.0000 mg | ORAL_TABLET | Freq: Four times a day (QID) | ORAL | Status: DC | PRN
  Filled 2012-08-22: qty 1

## 2012-08-22 MED ORDER — ONDANSETRON HCL 4 MG PO TABS: 4.0000 mg | ORAL_TABLET | Freq: Four times a day (QID) | ORAL | Status: DC | PRN

## 2012-08-22 MED ORDER — VANCOMYCIN HCL IN DEXTROSE 1-5 GM/200ML-% IV SOLN
1000.0000 mg | Freq: Once | INTRAVENOUS | Status: AC
  Administered 2012-08-22: 1000 mg via INTRAVENOUS
  Filled 2012-08-22: qty 200

## 2012-08-22 MED ORDER — TAMSULOSIN HCL 0.4 MG PO CAPS
0.4000 mg | ORAL_CAPSULE | Freq: Every day | ORAL | Status: DC
  Administered 2012-08-22 – 2012-08-29 (×7): 0.4 mg via ORAL
  Filled 2012-08-22 (×9): qty 1

## 2012-08-22 MED ORDER — ONDANSETRON HCL 4 MG/2ML IJ SOLN: 4.0000 mg | Freq: Three times a day (TID) | INTRAMUSCULAR | Status: AC | PRN

## 2012-08-22 MED ORDER — BUDESONIDE-FORMOTEROL FUMARATE 160-4.5 MCG/ACT IN AERO
2.0000 | INHALATION_SPRAY | Freq: Two times a day (BID) | RESPIRATORY_TRACT | Status: DC | PRN
  Filled 2012-08-22: qty 6

## 2012-08-22 MED ORDER — MORPHINE SULFATE 2 MG/ML IJ SOLN
1.0000 mg | INTRAMUSCULAR | Status: DC | PRN
  Administered 2012-08-22 (×2): 2 mg via INTRAVENOUS
  Filled 2012-08-22 (×2): qty 1

## 2012-08-22 MED ORDER — ONDANSETRON HCL 4 MG/2ML IJ SOLN
4.0000 mg | Freq: Four times a day (QID) | INTRAMUSCULAR | Status: DC | PRN
  Filled 2012-08-22: qty 2

## 2012-08-22 MED ORDER — HYDROMORPHONE HCL PF 1 MG/ML IJ SOLN
1.0000 mg | INTRAMUSCULAR | Status: DC | PRN
  Administered 2012-08-22: 1 mg via INTRAVENOUS
  Filled 2012-08-22: qty 1

## 2012-08-22 MED ORDER — GABAPENTIN 300 MG PO CAPS
600.0000 mg | ORAL_CAPSULE | Freq: Every day | ORAL | Status: DC
  Administered 2012-08-22 – 2012-08-29 (×7): 600 mg via ORAL
  Filled 2012-08-22 (×9): qty 2

## 2012-08-22 MED ORDER — MAGNESIUM SULFATE 40 MG/ML IJ SOLN
4.0000 g | Freq: Once | INTRAMUSCULAR | Status: AC
  Administered 2012-08-22: 4 g via INTRAVENOUS
  Filled 2012-08-22: qty 100

## 2012-08-22 MED ORDER — ACETAMINOPHEN 325 MG PO TABS
650.0000 mg | ORAL_TABLET | Freq: Four times a day (QID) | ORAL | Status: DC | PRN
  Administered 2012-08-22: 650 mg via ORAL
  Filled 2012-08-22: qty 2

## 2012-08-22 MED ORDER — VANCOMYCIN HCL IN DEXTROSE 1-5 GM/200ML-% IV SOLN
1000.0000 mg | Freq: Two times a day (BID) | INTRAVENOUS | Status: DC
  Administered 2012-08-22 – 2012-08-25 (×6): 1000 mg via INTRAVENOUS
  Filled 2012-08-22 (×7): qty 200

## 2012-08-22 MED ORDER — HEPARIN SODIUM (PORCINE) 5000 UNIT/ML IJ SOLN: 5000.0000 [IU] | Freq: Three times a day (TID) | INTRAMUSCULAR | Status: DC

## 2012-08-22 MED ORDER — ATORVASTATIN CALCIUM 20 MG PO TABS
20.0000 mg | ORAL_TABLET | Freq: Every day | ORAL | Status: DC
  Administered 2012-08-22 – 2012-08-28 (×5): 20 mg via ORAL
  Filled 2012-08-22 (×8): qty 1

## 2012-08-22 MED ORDER — CIPROFLOXACIN IN D5W 400 MG/200ML IV SOLN
400.0000 mg | Freq: Two times a day (BID) | INTRAVENOUS | Status: DC
  Administered 2012-08-22 (×2): 400 mg via INTRAVENOUS
  Filled 2012-08-22 (×3): qty 200

## 2012-08-22 MED ORDER — PIPERACILLIN-TAZOBACTAM 3.375 G IVPB 30 MIN
3.3750 g | Freq: Once | INTRAVENOUS | Status: AC
  Administered 2012-08-22: 3.375 g via INTRAVENOUS
  Filled 2012-08-22: qty 50

## 2012-08-22 MED ORDER — TRAMADOL HCL 50 MG PO TABS
100.0000 mg | ORAL_TABLET | Freq: Four times a day (QID) | ORAL | Status: DC | PRN
  Administered 2012-08-22 (×2): 100 mg via ORAL
  Filled 2012-08-22 (×2): qty 2

## 2012-08-22 MED ORDER — CLOPIDOGREL BISULFATE 75 MG PO TABS
75.0000 mg | ORAL_TABLET | Freq: Every day | ORAL | Status: DC
  Administered 2012-08-22 – 2012-08-25 (×4): 75 mg via ORAL
  Filled 2012-08-22 (×6): qty 1

## 2012-08-22 MED ORDER — LISINOPRIL 20 MG PO TABS
20.0000 mg | ORAL_TABLET | Freq: Every day | ORAL | Status: DC
  Administered 2012-08-23 – 2012-08-29 (×6): 20 mg via ORAL
  Filled 2012-08-22 (×9): qty 1

## 2012-08-22 NOTE — Progress Notes (Signed)
Pt had fever 101.6.  Paged MD on call. New orders received for Acetaminophen 650 prn. Will continue to monitor pt.

## 2012-08-22 NOTE — Progress Notes (Addendum)
Patient ID: [name], male   DOB: 02-Jun-1950, 62 y.o.   MRN: [id number] [facility] PROGRESS NOTE  LEOVANNI BJORKMAN WUJ:[id number] DOB: 1950/07/24 DOA: 08/21/2012 PCP: [name], MD  Brief narrative: 62 y.o. year old male with COPD, lung cancer, chemotherapy induced neutropenia, peripheral artery disease, CVA, presenting with cellulitis of the left foot. Patient noted to have been recently admitted with left foot cellulitis and was discharged home on doxycycline. He explains that he is swelling and pain has not gotten better and erythema has progressed to involve the entire foot and extending slightly above the ankle. He also reports subjective fevers and chills.  Patient noted to currently be receiving chemotherapy for his lung cancer. Has had to hold on treatment for the past 3 weeks secondary to low cell counts and infection. No known trauma. In the ER , patient with noted white count 12.7. Left foot x-ray negative for any osteomyelitic changes. Patient states that he has been compliant with doxycycline at home.   Active Problems: Left foot cellulitis - Noted slight improvement based on markings made in the emergency department - Plan on obtaining vascular ultrasound to rule out blood clot in his left lower extremity is slightly more swollen than the right one - Agree with continuing broad-spectrum antibiotics for now vancomycin and Zosyn - Placed wound care consult and if no significant improvement will order MRI of the left foot Hyponatremia  - Likely secondary to prerenal etiology, we'll continue IV fluids and repeat BMP in the morning   Hypokalemia and hypomagnesemia - Will need to continue supplementing and rechecking electrolytes daily Leukocytosis - Secondary to primary problem, continue antibiotics day 2 - Repeat blood tests in the morning Acute blood loss anemia - Possibly chemotherapy related, we'll provide 2 units of PRBC - Repeat CBC in the morning -  Will also check FOBT Thrombocytopenia - Also possible side effect of chemotherapy, monitor closely as patient is on Lovenox for DVT prophylaxis -  CBC in the morning COPD, emphysema - Clinically stable at this time, oxygen saturations at target range  Consultants:  No thank youne  Procedures/Studies: Dg Chest 1 View 08/21/2012  Stable COPD/emphysema.  Spiculated bilateral pulmonary nodules are better appreciated by CT comparison.  No superimposed acute process    Dg Foot Complete Left 08/21/2012  Negative exam.    Antibiotics:  Vancomycin 08/06 -->  Zosyn 08/06 -->  Code Status: Full Family Communication: Pt and wife at bedside Disposition Plan: Home when medically stable  HPI/Subjective: No events overnight.   Objective: Filed Vitals:   08/22/12 0114 08/22/12 0132 08/22/12 0635 08/22/12 1008  BP: 109/56 130/59 110/55 114/54  Pulse: 105 109 105   Temp:  97.7 F (36.5 C) 98.3 F (36.8 C)   TempSrc:  Oral Oral   Resp: 18 16 20    Height:  5\' 6"  (1.676 m)    Weight:  62.1 kg (136 lb 14.5 oz)    SpO2: 97% 100% 97%     Intake/Output Summary (Last 24 hours) at 08/22/12 1113 Last data filed at 08/22/12 0811  Gross per 24 hour  Intake 1505.83 ml  Output    100 ml  Net 1405.83 ml    Exam:   General:  Pt is alert, follows commands appropriately, not in acute distress  Cardiovascular: Regular rate and rhythm, S1/S2, no murmurs, no rubs, no gallops  Respiratory: Clear to auscultation bilaterally, no wheezing, no crackles, no rhonchi  Abdomen: Soft, non tender, non distended, bowel sounds present,  no guarding  Extremities: Left foot erythema and edema, warm to touch and tenderness to palpation, swelling extends to the left knee area, erythema extends the left ankle area, left fifth toes darker and ? Ischemic, DP and PT palpable and pt reports sensation to soft touch  Neuro: Grossly nonfocal  Data Reviewed: Basic Metabolic Panel:  Recent Labs Lab 08/15/12 1117  08/16/12 1359 08/17/12 0520 08/21/12 2300 08/22/12  NA 135* 133* 134* 124*  --   K 3.4* 3.6 3.8 3.0*  --   CL  --  97 99 89*  --   CO2 25 28 26 24   --   GLUCOSE 115 101* 93 133*  --   BUN 7.1 7 5* 12  --   CREATININE 0.7 0.61 0.59 0.69  --   CALCIUM 9.0 9.1 9.0 8.8  --   MG  --  1.0*  --   --  0.9*   Liver Function Tests:  Recent Labs Lab 08/15/12 1117 08/16/12 1359 08/21/12 2300  AST 11 9 9   ALT 6 7 8   ALKPHOS 134 128* 128*  BILITOT 0.24 0.2* 0.3  PROT 7.0 6.7 6.8  ALBUMIN 2.9* 2.9* 2.5*   CBC:  Recent Labs Lab 08/15/12 1117 08/16/12 1359 08/17/12 0520 08/21/12 2300  WBC 2.7 Repeated and Verified* 7.5 11.2* 12.7*  NEUTROABS 0.3 Repeated and Verified* 3.9  --  9.5*  HGB 8.3 Repeated and Verified* 8.4* 8.2* 7.9*  HCT 24.2* 25.0* 24.6* 22.8*  MCV 92.9 91.6 92.1 90.8  PLT 187 159 168 128*     Scheduled Meds: . sodium chloride   Intravenous Once  . aspirin EC  81 mg Oral Daily  . atorvastatin  20 mg Oral q1800  . ciprofloxacin  400 mg Intravenous Q12H  . clopidogrel  75 mg Oral Daily  . enoxaparin (LOVENOX) injection  40 mg Subcutaneous Q24H  . gabapentin  600 mg Oral Daily  . lisinopril  20 mg Oral Daily  . nortriptyline  75 mg Oral QHS  . piperacillin-tazobactam (ZOSYN)  IV  3.375 g Intravenous Q8H  . sodium chloride  500 mL Intravenous Once  . tamsulosin  0.4 mg Oral Daily  . vancomycin  1,000 mg Intravenous Q12H   Continuous Infusions: . 0.9 % NaCl with KCl 40 mEq / L 75 mL/hr at 08/22/12 0256     Debbora Presto, MD  [facility] Pager [phone]  If 7PM-7AM, please contact night-coverage [email or web address] Password [email or web address] 08/22/2012, 11:13 AM   LOS: 1 day

## 2012-08-22 NOTE — Progress Notes (Addendum)
INITIAL NUTRITION ASSESSMENT  DOCUMENTATION CODES Per approved criteria  -Not Applicable   INTERVENTION: - Ensure Complete po BID, each supplement provides 350 kcal and 13 grams of protein. - Will order MVI  NUTRITION DIAGNOSIS: Inadequate oral intake related to lung cancer as evidenced by reported 20 lb weight loss in one year.   Goal: Pt to meet >/= 90% of their estimated nutrition needs   Monitor:  Weight, po intake, labs  Reason for Assessment: MST  62 y.o. male  Admitting Dx: <principal problem not specified>  ASSESSMENT: Pt with history of lung cancer admitted with cellulitis of left foot.   Pt reports that he has experienced a 20 lb weight loss over the past year. He reports his usual body weight as 156 lbs which he hasn't weighed in over a year. He says that he would like to have ensure supplements sent.   Height: Ht Readings from Last 1 Encounters:  08/22/12 5\' 6"  (1.676 m)    Weight: Wt Readings from Last 1 Encounters:  08/22/12 136 lb 14.5 oz (62.1 kg)    Ideal Body Weight: 59.3 kg  % Ideal Body Weight: 105%  Wt Readings from Last 10 Encounters:  08/22/12 136 lb 14.5 oz (62.1 kg)  08/16/12 141 lb (63.957 kg)  08/16/12 141 lb (63.957 kg)  08/15/12 137 lb 11.2 oz (62.46 kg)  08/08/12 136 lb 1.6 oz (61.735 kg)  07/25/12 137 lb 9.6 oz (62.415 kg)  07/17/12 139 lb (63.05 kg)  07/11/12 136 lb 4.8 oz (61.825 kg)  07/04/12 136 lb 6.4 oz (61.871 kg)  06/18/12 137 lb (62.143 kg)    Usual Body Weight: 156 lbs one year ago  % Usual Body Weight: 87%  BMI:  Body mass index is 22.11 kg/(m^2).  Estimated Nutritional Needs: Kcal: 1550-1650  Protein: 80-90 g Fluid: >1.7 L  Skin: cellulitis of left foot  Diet Order: General  EDUCATION NEEDS: -No education needs identified at this time   Intake/Output Summary (Last 24 hours) at 08/22/12 1340 Last data filed at 08/22/12 0811  Gross per 24 hour  Intake 1505.83 ml  Output    100 ml  Net 1405.83 ml     Last BM: none recorded   Labs:   Recent Labs Lab 08/16/12 1359 08/17/12 0520 08/21/12 2300 08/22/12 08/22/12 1130  NA 133* 134* 124*  --   --   K 3.6 3.8 3.0*  --   --   CL 97 99 89*  --   --   CO2 28 26 24   --   --   BUN 7 5* 12  --   --   CREATININE 0.61 0.59 0.69  --   --   CALCIUM 9.1 9.0 8.8  --   --   MG 1.0*  --   --  0.9* 1.7  GLUCOSE 101* 93 133*  --   --     CBG (last 3)  No results found for this basename: GLUCAP,  in the last 72 hours  Scheduled Meds: . aspirin EC  81 mg Oral Daily  . atorvastatin  20 mg Oral q1800  . ciprofloxacin  400 mg Intravenous Q12H  . clopidogrel  75 mg Oral Daily  . enoxaparin (LOVENOX) injection  40 mg Subcutaneous Q24H  . gabapentin  600 mg Oral Daily  . lisinopril  20 mg Oral Daily  . nortriptyline  75 mg Oral QHS  . piperacillin-tazobactam (ZOSYN)  IV  3.375 g Intravenous Q8H  . sodium chloride  500 mL Intravenous Once  . tamsulosin  0.4 mg Oral Daily  . vancomycin  1,000 mg Intravenous Q12H    Continuous Infusions: . 0.9 % NaCl with KCl 40 mEq / L 75 mL/hr at 08/22/12 1610    Past Medical History  Diagnosis Date  . BENIGN PROSTATIC HYPERTROPHY 12/24/2009  . CEREBROVASCULAR ACCIDENT, HX OF 12/24/2009  . HYPERLIPIDEMIA 12/24/2009  . HYPERTENSION 12/24/2009  . Testosterone deficiency   . ED (erectile dysfunction)   . Claudication in peripheral vascular disease, lifestyle limiting, with abnormal arterial dopplers of lower ext. 06/30/2011  . S/P angioplasty with stent to Lt. SFA 06/30/11 06/30/2011  . COPD 12/24/2009  . Shortness of breath     "from the COPD" (10/13/2011)  . Stroke 1996    "right side not as strong as left since; numbness R leg/foot" (10/13/2011)  . Pernicious anemia     Past Surgical History  Procedure Laterality Date  . Cervical fusion  1980's    "total of 3-4 OR's on my neck; pinched nerve" (10/13/2011)  . Aneurysm coiling  2007  . Peripheral arterial stent graft  06/30/2010; 10/13/10    left; left   . Video bronchoscopy Bilateral 04/03/2012    Procedure: VIDEO BRONCHOSCOPY WITHOUT FLUORO;  Surgeon: Nyoka Cowden, MD;  Location: [facility];  Service: Cardiopulmonary;  Laterality: Bilateral;    Ebbie Latus RD, LDN

## 2012-08-22 NOTE — Progress Notes (Signed)
CRITICAL VALUE ALERT  Critical value received:  Mg=0.9  Date of notification:  08/22/12  Time of notification:  02:10  Critical value read back:yes  Nurse who received alert:  Tamala Bari  MD notified (1st page):  K.Schorr,NP([facility])  Time of first page:  02:41  MD notified (2nd page):K.Schorr,NP  Time of second page:03:04  Responding MD:  K.Schorr,NP  Time MD responded:  03:15

## 2012-08-22 NOTE — ED Provider Notes (Signed)
Medical screening examination/treatment/procedure(s) were conducted as a shared visit with non-physician practitioner(s) and myself.  I personally evaluated the patient during the encounter   Loren Racer, MD 08/22/12 [phone]

## 2012-08-22 NOTE — Consult Note (Signed)
WOC consult Note Reason for Consult:left foot, 5th metatarsal head ulceration with 5th digit discoloration (purple) and foot pain that extends to the heel Wound type:infectious vs vascular Pressure Ulcer POA: No Measurement: 1cm x 1cm x 0.2cm Wound ZOX:WRUEA its not significantly different from last week, but periwound and pain are new and are indicative of active infection or sudden vascular changes. Drainage (amount, consistency, odor) Small amount of dried exudate (serosanguinous) on old dressing Periwound: erythematous Dressing procedure/placement/frequency:I will implement a conservative saline dressing to be changed twice daily and recommend surgical (either VVS or orthopedic) consultation. WOC Nursing Team will not follow.  Please re-consult if needed. Thanks, Ladona Mow, MSN, RN, [facility], CWOCN [phone])

## 2012-08-22 NOTE — Progress Notes (Signed)
Left lower extremity venous duplex completed.  Left:  No evidence of DVT, superficial thrombosis, or Baker's cyst.  Right:  Negative for DVT in the common femoral vein.  

## 2012-08-22 NOTE — Progress Notes (Signed)
ANTIBIOTIC CONSULT NOTE - INITIAL  Pharmacy Consult for Vancomycin and Zosyn  Indication: Cellulitis  No Known Allergies  Patient Measurements: Height: 5\' 6"  (167.6 cm) Weight: 136 lb 14.5 oz (62.1 kg) IBW/kg (Calculated) : 63.8 Adjusted Body Weight:   Vital Signs: Temp: 97.7 F (36.5 C) (08/07 0132) Temp src: Oral (08/07 0132) BP: 130/59 mmHg (08/07 0132) Pulse Rate: 109 (08/07 0132) Intake/Output from previous day: 08/06 0701 - 08/07 0700 In: 500 [I.V.:500] Out: -  Intake/Output from this shift: Total I/O In: 500 [I.V.:500] Out: -   Labs:  Recent Labs  08/21/12 2300  WBC 12.7*  HGB 7.9*  PLT 128*  CREATININE 0.69   Estimated Creatinine Clearance: 83 ml/min (by C-G formula based on Cr of 0.69). No results found for this basename: VANCOTROUGH, VANCOPEAK, VANCORANDOM, GENTTROUGH, GENTPEAK, GENTRANDOM, TOBRATROUGH, TOBRAPEAK, TOBRARND, AMIKACINPEAK, AMIKACINTROU, AMIKACIN,  in the last 72 hours   Microbiology: No results found for this or any previous visit (from the past 720 hour(s)).  Medical History: Past Medical History  Diagnosis Date  . BENIGN PROSTATIC HYPERTROPHY 12/24/2009  . CEREBROVASCULAR ACCIDENT, HX OF 12/24/2009  . HYPERLIPIDEMIA 12/24/2009  . HYPERTENSION 12/24/2009  . Testosterone deficiency   . ED (erectile dysfunction)   . Claudication in peripheral vascular disease, lifestyle limiting, with abnormal arterial dopplers of lower ext. 06/30/2011  . S/P angioplasty with stent to Lt. SFA 06/30/11 06/30/2011  . COPD 12/24/2009  . Shortness of breath     "from the COPD" (10/13/2011)  . Stroke 1996    "right side not as strong as left since; numbness R leg/foot" (10/13/2011)  . Pernicious anemia     Medications:  Anti-infectives   Start     Dose/Rate Route Frequency Ordered Stop   08/22/12 1200  vancomycin (VANCOCIN) IVPB 1000 mg/200 mL premix     1,000 mg 200 mL/hr over 60 Minutes Intravenous Every 12 hours 08/22/12 0532     08/22/12 0600   piperacillin-tazobactam (ZOSYN) IVPB 3.375 g     3.375 g 12.5 mL/hr over 240 Minutes Intravenous 3 times per day 08/22/12 0532     08/22/12 0115  ciprofloxacin (CIPRO) IVPB 400 mg     400 mg 200 mL/hr over 60 Minutes Intravenous Every 12 hours 08/22/12 0113     08/22/12 0030  vancomycin (VANCOCIN) IVPB 1000 mg/200 mL premix     1,000 mg 200 mL/hr over 60 Minutes Intravenous  Once 08/22/12 0018 08/22/12 0201   08/22/12 0030  piperacillin-tazobactam (ZOSYN) IVPB 3.375 g     3.375 g 100 mL/hr over 30 Minutes Intravenous  Once 08/22/12 0018 08/22/12 0100     Assessment: Patient with cellulitis.  Goal of Therapy:  Vancomycin trough level 10-15 mcg/ml Zosyn based on renal function   Plan:  Measure antibiotic drug levels at steady state Follow up culture results Vancomycin 1gm iv q12hr Zosyn 3.375g IV Q8H infused over 4hrs.   Darlina Guys, Jacquenette Shone Crowford 08/22/2012,5:32 AM

## 2012-08-22 NOTE — H&P (Signed)
Hospitalist Admission History and Physical  Patient name: [name] Medical record number: [id number] Date of birth: 07-Sep-1950 Age: 62 y.o. Gender: male  Primary Care Provider: [name], MD  Chief Complaint: cellulitis  History of Present Illness:This is a 62 y.o. year old male with COPD, lung cancer, chemotherapy induced neutropenia, peripheral artery disease, CVA, presenting with cellulitis of the left foot. Patient noted to have been recently admitted about 34 days ago with left foot cellulitis. Patient was placed on a regimen of thinking Zosyn. Medication was continued for 1-2 days. Patient was discharged and doxycycline. Patient states that since this point that left foot pain and redness has seemed to progressively worsen. Has had some subjective fevers and chills at home. Patient noted to currently be receiving chemotherapy for his lung cancer. Has had to hold on treatment for the past 3 weeks secondary to either low cell counts or infection. No known trauma. Wound care was counseled at last visit with general care. In the ER today, patient with noted white count 12.7. Left foot x-ray negative for any osteomyelitic changes. Patient states that he has been compliant with doxycycline at home.  Patient Active Problem List   Diagnosis Date Noted  . PAD (peripheral artery disease) 08/16/2012  . Foot ulcer, left 08/10/2012  . Hypomagnesemia 06/16/2012  . Hypokalemia 06/16/2012  . Sepsis 06/15/2012  . Sinus tachycardia 06/15/2012  . Neutropenia 06/14/2012  . Neutropenic fever 06/14/2012  . Lung cancer 03/21/2012  . PVD, s/p Rt SFA PTA/ stenting 10/13/11 10/14/2011  . Claudication in peripheral vascular disease, lifestyle limiting, with abnormal arterial dopplers of lower ext. 06/30/2011  . S/P angioplasty with stent to Lt. SFA 06/30/11 (for ISR from 9/12) 06/30/2011  . Tobacco abuse 10/31/2010  . HYPERLIPIDEMIA 12/24/2009  . HYPERTENSION 12/24/2009  . COPD 12/24/2009   . BENIGN PROSTATIC HYPERTROPHY 12/24/2009  . CEREBROVASCULAR ACCIDENT, HX OF 12/24/2009   Past Medical History: Past Medical History  Diagnosis Date  . BENIGN PROSTATIC HYPERTROPHY 12/24/2009  . CEREBROVASCULAR ACCIDENT, HX OF 12/24/2009  . HYPERLIPIDEMIA 12/24/2009  . HYPERTENSION 12/24/2009  . Testosterone deficiency   . ED (erectile dysfunction)   . Claudication in peripheral vascular disease, lifestyle limiting, with abnormal arterial dopplers of lower ext. 06/30/2011  . S/P angioplasty with stent to Lt. SFA 06/30/11 06/30/2011  . COPD 12/24/2009  . Shortness of breath     "from the COPD" (10/13/2011)  . Stroke 1996    "right side not as strong as left since; numbness R leg/foot" (10/13/2011)  . Pernicious anemia     Past Surgical History: Past Surgical History  Procedure Laterality Date  . Cervical fusion  1980's    "total of 3-4 OR's on my neck; pinched nerve" (10/13/2011)  . Aneurysm coiling  2007  . Peripheral arterial stent graft  06/30/2010; 10/13/10    left; left  . Video bronchoscopy Bilateral 04/03/2012    Procedure: VIDEO BRONCHOSCOPY WITHOUT FLUORO;  Surgeon: Nyoka Cowden, MD;  Location: [facility];  Service: Cardiopulmonary;  Laterality: Bilateral;    Social History: History   Social History  . Marital Status: Married    Spouse Name: N/A    Number of Children: 3  . Years of Education: N/A   Occupational History  . Retired     Airline pilot   Social History Main Topics  . Smoking status: Current Every Day Smoker -- 1.00 packs/day for 45 years    Types: Cigarettes  . Smokeless tobacco: Never Used  . Alcohol Use:  No  . Drug Use: No  . Sexually Active: Not Currently   Other Topics Concern  . None   Social History Narrative  . None    Family History: Family History  Problem Relation Age of Onset  . Emphysema Father     smoked  . Emphysema Paternal Uncle     smoked  . Heart disease Mother   . Lung cancer Father     smoked  . Lung cancer Paternal  Grandfather     smoked    Allergies: No Known Allergies  Current Facility-Administered Medications  Medication Dose Route Frequency Provider Last Rate Last Dose  . 0.9 %  sodium chloride infusion   Intravenous Once [employer], PA-C 100 mL/hr at 08/22/12 0100    . aspirin tablet 81 mg  81 mg Oral Daily Doree Albee, MD      . atorvastatin (LIPITOR) tablet 20 mg  20 mg Oral Daily Doree Albee, MD      . budesonide-formoterol [facility]) 160-4.5 MCG/ACT inhaler 2 puff  2 puff Inhalation BID PRN Doree Albee, MD      . ciprofloxacin (CIPRO) IVPB 400 mg  400 mg Intravenous Q12H Doree Albee, MD      . clopidogrel (PLAVIX) tablet 75 mg  75 mg Oral Daily Doree Albee, MD      . gabapentin (NEURONTIN) tablet 600 mg  600 mg Oral BH-q7a Doree Albee, MD      . HYDROmorphone (DILAUDID) injection 1 mg  1 mg Intravenous Q4H PRN Nicole Pisciotta, PA-C      . lisinopril (PRINIVIL,ZESTRIL) tablet 20 mg  20 mg Oral Daily Doree Albee, MD      . morphine 2 MG/ML injection 1-2 mg  1-2 mg Intravenous Q2H PRN Doree Albee, MD      . nortriptyline (PAMELOR) capsule 75 mg  75 mg Oral QHS Doree Albee, MD      . ondansetron [facility]) injection 4 mg  4 mg Intravenous Q8H PRN Nicole Pisciotta, PA-C      . ondansetron (ZOFRAN) tablet 4 mg  4 mg Oral Q6H PRN Doree Albee, MD       Or  . ondansetron [facility]) injection 4 mg  4 mg Intravenous Q6H PRN Doree Albee, MD      . prochlorperazine (COMPAZINE) tablet 10 mg  10 mg Oral Q6H PRN Doree Albee, MD      . sodium chloride 0.9 % bolus 500 mL  500 mL Intravenous Once Nicole Pisciotta, PA-C   500 mL at 08/22/12 0035  . tamsulosin (FLOMAX) capsule 0.4 mg  0.4 mg Oral Daily Doree Albee, MD      . traMADol Janean Sark) tablet 100 mg  100 mg Oral Q6H PRN Doree Albee, MD      . vancomycin (VANCOCIN) IVPB 1000 mg/200 mL premix  1,000 mg Intravenous Once Julian Crowford Darlina Guys., RPH 200 mL/hr at 08/22/12 0101 1,000 mg at 08/22/12 0101   Current  Outpatient Prescriptions  Medication Sig Dispense Refill  . acetaminophen (TYLENOL) 500 MG tablet Take 1,000 mg by mouth every 6 (six) hours as needed for pain.       Marland Kitchen aspirin 81 MG tablet Take 81 mg by mouth daily.      Marland Kitchen atorvastatin (LIPITOR) 20 MG tablet Take 20 mg by mouth daily.      . budesonide-formoterol (SYMBICORT) 160-4.5 MCG/ACT inhaler Inhale 2 puffs into the lungs 2 (two) times daily as needed (for shortness of breath).      . clopidogrel (  PLAVIX) 75 MG tablet Take 75 mg by mouth daily.      Marland Kitchen doxycycline (VIBRA-TABS) 100 MG tablet Take 1 tablet (100 mg total) by mouth 2 (two) times daily.  20 tablet  0  . gabapentin (NEURONTIN) 600 MG tablet Take 600 mg by mouth every morning.       Marland Kitchen lisinopril (PRINIVIL,ZESTRIL) 20 MG tablet Take 20 mg by mouth daily.      . mupirocin cream (BACTROBAN) 2 % Apply topically daily. Onto left plantar wound  15 g  0  . nortriptyline (PAMELOR) 75 MG capsule Take 75 mg by mouth at bedtime.      . prochlorperazine (COMPAZINE) 10 MG tablet Take 10 mg by mouth every 6 (six) hours as needed (for nausea).      . tamsulosin (FLOMAX) 0.4 MG CAPS Take 0.4 mg by mouth daily.       . traMADol (ULTRAM) 50 MG tablet Take 100 mg by mouth every 6 (six) hours as needed for pain.       Review Of Systems: 12 point ROS negative except as noted above in HPI.  Physical Exam: Filed Vitals:   08/21/12 2300  BP: 92/57  Pulse: 123  Temp:   Resp:     General: alert and cooperative HEENT: PERRLA and extra ocular movement intact Heart: S1, S2 normal, no murmur, rub or gallop, regular rate and rhythm Lungs: clear to auscultation Abdomen: abdomen is soft without significant tenderness, masses, organomegaly or guarding Extremities: Left fifth toe peripheral erythema with noted open ulcer on plantar aspect of the fifth digit. Noted streaking 4-5 cm at the anterior shin. Area of marked and dated.  Skin: See extremity exam. 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 Otherwise no focal neuro deficits noted.  Labs and Imaging: Lab Results  Component Value Date/Time   NA 124* 08/21/2012 11:00 PM   NA 135* 08/15/2012 11:17 AM   K 3.0* 08/21/2012 11:00 PM   K 3.4* 08/15/2012 11:17 AM   CL 89* 08/21/2012 11:00 PM   CL 97* 06/13/2012  1:00 PM   CO2 24 08/21/2012 11:00 PM   CO2 25 08/15/2012 11:17 AM   BUN 12 08/21/2012 11:00 PM   BUN 7.1 08/15/2012 11:17 AM   CREATININE 0.69 08/21/2012 11:00 PM   CREATININE 0.7 08/15/2012 11:17 AM   GLUCOSE 133* 08/21/2012 11:00 PM   GLUCOSE 115 08/15/2012 11:17 AM   GLUCOSE 123* 06/13/2012  1:00 PM   Lab Results  Component Value Date   WBC 12.7* 08/21/2012   HGB 7.9* 08/21/2012   HCT 22.8* 08/21/2012   MCV 90.8 08/21/2012   PLT 128* 08/21/2012    Dg Chest 1 View  08/21/2012   *RADIOLOGY REPORT*  Clinical Data: Fever  CHEST - 1 VIEW  Comparison: 07/08/2012, 06/14/2012  Findings: Hyperinflation noted compatible with background emphysema.  The bilateral pulmonary nodules are better demonstrated on the CT comparison.  Left mediastinal mass is not well appreciated by plain radiography.  No definite superimposed CHF or pneumonia.  No collapse or consolidation.  No significant effusion or pneumothorax.  Trachea is midline.  IMPRESSION: Stable COPD/emphysema.  Spiculated bilateral pulmonary nodules are better appreciated by CT comparison.  No superimposed acute process   Original Report Authenticated By: Judie Petit. Shick, M.D.   Dg Foot Complete Left  08/21/2012   *RADIOLOGY REPORT*  Clinical Data: Plantar ulceration left foot.  LEFT FOOT - COMPLETE 3+ VIEW  Comparison: Plain films left foot 08/16/2012.  Findings: No soft  tissue gas collection or radiopaque foreign body is seen.  No bony destructive change is identified.  Small plantar calcaneal spur is noted.  Skin ulceration is not discretely visualized.  IMPRESSION: Negative exam.   Original Report Authenticated By: Holley Dexter, M.D.     Assessment and Plan: TREBOR GALDAMEZ is a  62 y.o. year old male presenting with recurrent cellulitis Cellulitis: Will place patient on regimen of vancomycin, Zosyn, Cipro. Will place on double gram-negative coverage given baseline immunosuppression. Consult wound care. Will likely need an extended course of IV antibiotics. Wound culture is wound begins to express any fluid.  Anemia/neutropenia: Secondary to chemotherapy in setting of malignancy. Fairly appropriate white blood cell response to infection. Hemoglobin stable. Hem-Onc to follow.   COPD: Stable. Continue home Symbicort.  Hypertension, peripheral vascular disease: Blood pressure stable. Continue home regimen. Continue home aspirin and Plavix.  FEN/GI: Noted hyponatremia. Clinically dry on exam. Hydrate and reassess. Hypokalemia. Likely secondary to malignancy. Had K to fluids. Check mag level. Reassess. Heart healthy diet. PPI.  Prophylaxis: Hold off on anti-coagulation given baseline anemia, thrombocytopenia and concomitant aspirin and Plavix use (equivalent bleeding risk to warfarin). SCDs. Continue to follow closely.  Disposition: Pending further evaluation Code Status: Full code       Doree Albee MD  Pager: [phone]

## 2012-08-22 NOTE — Care Management Note (Signed)
    Page 1 of 1   08/22/2012     10:54:41 AM   CARE MANAGEMENT NOTE 08/22/2012  Patient:  GUILLERMO, NEHRING   Account Number:  [id number]  Date Initiated:  08/22/2012  Documentation initiated by:  Lanier Clam  Subjective/Objective Assessment:   ADMITTED W/HYPONATREMIA,L FOOT CELLULITIS.     Action/Plan:   FROM HOME W/SPOUSE.HAS PCP,PHARMACY.   Anticipated DC Date:  08/26/2012   Anticipated DC Plan:  HOME/SELF CARE      DC Planning Services  CM consult      Choice offered to / List presented to:             Status of service:  In process, will continue to follow Medicare Important Message given?   (If response is "NO", the following Medicare IM given date fields will be blank) Date Medicare IM given:   Date Additional Medicare IM given:    Discharge Disposition:    Per UR Regulation:  Reviewed for med. necessity/level of care/duration of stay  If discussed at Long Length of Stay Meetings, dates discussed:    Comments:  08/22/12 [facility] RN,BSN NCM [phone]

## 2012-08-23 DIAGNOSIS — L02619 Cutaneous abscess of unspecified foot: Secondary | ICD-10-CM

## 2012-08-23 DIAGNOSIS — N400 Enlarged prostate without lower urinary tract symptoms: Secondary | ICD-10-CM

## 2012-08-23 LAB — TYPE AND SCREEN
ABO/RH(D): A POS
Antibody Screen: NEGATIVE
Unit division: 0

## 2012-08-23 LAB — BASIC METABOLIC PANEL
BUN: 5 mg/dL — ABNORMAL LOW (ref 6–23)
Calcium: 8.6 mg/dL (ref 8.4–10.5)
Creatinine, Ser: 0.53 mg/dL (ref 0.50–1.35)
GFR calc Af Amer: >90 mL/min (ref >90)
GFR calc non Af Amer: >90 mL/min (ref >90)
Potassium: 3.5 mEq/L (ref 3.5–5.1)

## 2012-08-23 LAB — CBC
HCT: 30.3 % — ABNORMAL LOW (ref 39.0–52.0)
MCHC: 34 g/dL (ref 30.0–36.0)
MCV: 90.4 fL (ref 78.0–100.0)
RDW: 18.4 % — ABNORMAL HIGH (ref 11.5–15.5)

## 2012-08-23 MED ORDER — PIPERACILLIN-TAZOBACTAM 3.375 G IVPB
3.3750 g | Freq: Three times a day (TID) | INTRAVENOUS | Status: DC
  Administered 2012-08-23 – 2012-08-28 (×16): 3.375 g via INTRAVENOUS
  Filled 2012-08-23 (×20): qty 50

## 2012-08-23 MED ORDER — CEFAZOLIN SODIUM-DEXTROSE 2-3 GM-% IV SOLR
2.0000 g | Freq: Three times a day (TID) | INTRAVENOUS | Status: DC
  Filled 2012-08-23 (×2): qty 50

## 2012-08-23 NOTE — Progress Notes (Signed)
Subjective: Patient is a 62 year old white male with metastatic non-small cell lung cancer, squamous cell carcinoma diagnosed in March of 2014. He is currently being treated with systemic chemotherapy in the form of carboplatin for an AUC of 5 given on day 1 and Abraxane at 100 mg per meter square given on days 1, 8 and 15 every 3 weeks status post 4 cycles as well as day 1 of cycle 5. He was last treated 08/01/2012. Treatment has been held secondary to neutropenia. He has been in mid again for left foot cellulitis. He had a recent it mentioned for left foot cellulitis and was discharged on doxycycline. He had been compliant with the doxycycline but noted increased redness and discomfort of the left foot. Since being admitted to being placed on IV antibiotics he is noticed even more it increased redness swelling and pain of the left foot and lower extremity. The left fifth toe has now become extremely tender and is now dark and discolored. Both he and his wife have some concerns regarding the circulation in this toe.  Objective: Vital signs in last 24 hours: Temp:  [98.9 F (37.2 C)-101.6 F (38.7 C)] 99.5 F (37.5 C) (08/08 1247) Pulse Rate:  [107-127] 123 (08/08 1247) Resp:  [16-18] 18 (08/08 1247) BP: (100-138)/(51-73) 138/51 mmHg (08/08 1247) SpO2:  [93 %-96 %] 96 % (08/08 1247)  Intake/Output from previous day: 08/07 0701 - 08/08 0700 In: 2514.5 [P.O.:702; I.V.:400; Blood:612.5; IV Piggyback:800] Out: 975 [Urine:975] Intake/Output this shift: Total I/O In: -  Out: 775 [Urine:775]  General appearance: alert, cooperative, appears stated age and mild distress Resp: clear to auscultation bilaterally Cardio: regular rate and rhythm, S1, S2 normal, no murmur, click, rub or gallop GI: soft, non-tender; bowel sounds normal; no masses,  no organomegaly Extremities: Left lower extremity particularly the foot with significant erythema warmth and tenderness. There is 2-3+ soft tissue swelling  about the foot to diminishing to trace to 1+ pretibial he. There is dry skin and some peeling of the skin  in the ankle area 5th toe left foot dark red to purple black in color slightly cooler than the rest of the foot concerning for possible necrosis, toe is very tender  Lab Results:   Recent Labs  08/21/12 2300 08/23/12 0510  WBC 12.7* 14.3*  HGB 7.9* 10.3*  HCT 22.8* 30.3*  PLT 128* 97*   BMET  Recent Labs  08/21/12 2300 08/23/12 0510  NA 124* 128*  K 3.0* 3.5  CL 89* 94*  CO2 24 25  GLUCOSE 133* 104*  BUN 12 5*  CREATININE 0.69 0.53  CALCIUM 8.8 8.6    Studies/Results: Dg Chest 1 View  08/21/2012   *RADIOLOGY REPORT*  Clinical Data: Fever  CHEST - 1 VIEW  Comparison: 07/08/2012, 06/14/2012  Findings: Hyperinflation noted compatible with background emphysema.  The bilateral pulmonary nodules are better demonstrated on the CT comparison.  Left mediastinal mass is not well appreciated by plain radiography.  No definite superimposed CHF or pneumonia.  No collapse or consolidation.  No significant effusion or pneumothorax.  Trachea is midline.  IMPRESSION: Stable COPD/emphysema.  Spiculated bilateral pulmonary nodules are better appreciated by CT comparison.  No superimposed acute process   Original Report Authenticated By: Judie Petit. Shick, M.D.   Dg Foot Complete Left  08/21/2012   *RADIOLOGY REPORT*  Clinical Data: Plantar ulceration left foot.  LEFT FOOT - COMPLETE 3+ VIEW  Comparison: Plain films left foot 08/16/2012.  Findings: No soft tissue gas collection or  radiopaque foreign body is seen.  No bony destructive change is identified.  Small plantar calcaneal spur is noted.  Skin ulceration is not discretely visualized.  IMPRESSION: Negative exam.   Original Report Authenticated By: Holley Dexter, M.D.    Medications: I have reviewed the patient's current medications.  Assessment/Plan: Patient is a pleasant 62 year old Caucasian male with metastatic non-small cell lung cancer,  squamous cell carcinoma treated with systemic chemotherapy with carboplatin and Abraxane, last given 08/01/2012, held secondary to neutropenia. Patient is admitted secondary to worsening left foot cellulitis. With the concentration of infection involving the left fifth toe. White count is elevated more so at 14.3. I would recommend infectious disease consultation for further evaluation and management and I have placed a request for an infectious disease consult. Platelets are slightly decreased at 97,000, would continue to monitor this closely. His hemoglobin is currently stable at 10.3, would advise continued monitoring of this parameter as well.   LOS: 2 days    Conni Slipper, PA-C 08/23/2012 2:00 PM  ADDENDUM: Hematology/Oncology Attending: The patient is seen and examined today. I agree with the above note. He was admitted with cellulitis of the left foot. He is currently on IV antibiotics with Zosyn and vancomycin. His left small toe is turning black and we have concern about poor circulation to his toes. We will consult infectious disease as well as vascular surgery for evaluation of this patient. He is otherwise doing fine. Continue current supportive care. Thank you so much for taking good care of Mr. Trigueros, I will continue to monitor the patient with you and assist in his management on as-needed basis.

## 2012-08-23 NOTE — Consult Note (Signed)
[facility]     Reason for Consult: increasing WBC    Referring Physician: Dr. Shirline Frees  Active Problems:   * No active hospital problems. *   . aspirin EC  81 mg Oral Daily  . atorvastatin  20 mg Oral q1800  . clopidogrel  75 mg Oral Daily  . enoxaparin (LOVENOX) injection  40 mg Subcutaneous Q24H  . feeding supplement  237 mL Oral BID BM  . gabapentin  600 mg Oral Daily  . lisinopril  20 mg Oral Daily  . multivitamin with minerals  1 tablet Oral Daily  . nortriptyline  75 mg Oral QHS  . piperacillin-tazobactam (ZOSYN)  IV  3.375 g Intravenous Q8H  . sodium chloride  500 mL Intravenous Once  . tamsulosin  0.4 mg Oral Daily  . vancomycin  1,000 mg Intravenous Q12H    Recommendations: Vascular surgery consult No indication to broaden antibiotics, can continue with current antibiotics over the weekend while undergoing evaluation by surgery.    Dr. Daiva Eves is available over the weekend if needed, otherwise I will follow up on Monday.   Assessment: He has a necrotic appearing toe and likely needs surgical management.  With his history of stent, vascular surgery seems most appropriate.     Antibiotics: Vancomcyin day 2 Zosyn day 2  HPI: [name] is a 62 y.o. male with a history of metastatic non-small cell lung cancer, squamous cell carcinoma on chemotherapy with carboplatin and Abraxane and also a history of angioplasty and stent of SFA with PAD who came in with cellulitis and non-healing ulcer.  He was seen by his PCP and placed on appropriate antibiotics.  He had a non-healing ulcer in the area and was developing pain and swelling.  He was sent for admission after seeing his PCP 8/1 for broad spectrum antibiotics and initially did well and sent out on doxycycline.  He came back on 8/7 and placed on vancomycin, zosyn and cipro.  Xray of foot was unrevealing for any bony destruction. Area around foot was erythematous but now developed necrotic  appearing tissue.     Review of Systems: A comprehensive review of systems was negative.  Past Medical History  Diagnosis Date  . BENIGN PROSTATIC HYPERTROPHY 12/24/2009  . CEREBROVASCULAR ACCIDENT, HX OF 12/24/2009  . HYPERLIPIDEMIA 12/24/2009  . HYPERTENSION 12/24/2009  . Testosterone deficiency   . ED (erectile dysfunction)   . Claudication in peripheral vascular disease, lifestyle limiting, with abnormal arterial dopplers of lower ext. 06/30/2011  . S/P angioplasty with stent to Lt. SFA 06/30/11 06/30/2011  . COPD 12/24/2009  . Shortness of breath     "from the COPD" (10/13/2011)  . Stroke 1996    "right side not as strong as left since; numbness R leg/foot" (10/13/2011)  . Pernicious anemia     History  Substance Use Topics  . Smoking status: Current Every Day Smoker -- 0.50 packs/day for 45 years    Types: Cigarettes  . Smokeless tobacco: Never Used  . Alcohol Use: No    Family History  Problem Relation Age of Onset  . Emphysema Father     smoked  . Emphysema Paternal Uncle     smoked  . Heart disease Mother   . Lung cancer Father     smoked  . Lung cancer Paternal Grandfather     smoked   No Known Allergies  OBJECTIVE: Blood pressure 138/51, pulse 123, temperature 99.5 F (37.5 C), temperature  source Oral, resp. rate 18, height 5\' 6"  (1.676 m), weight 136 lb 14.5 oz (62.1 kg), SpO2 96.00%. General: Awake, alert, nad Skin: erythema of left leg, warmth, left toe necrotic appearing with ulcer Lungs: CTA B Cor: RRR  Abdomen: soft, nt, nd, +bs  Microbiology: Recent Results (from the past 240 hour(s))  CULTURE, BLOOD (ROUTINE X 2)     Status: None   Collection Time    08/21/12 11:15 PM      Result Value Range Status   Specimen Description BLOOD RIGHT HAND   Final   Special Requests BOTTLES DRAWN AEROBIC AND ANAEROBIC   Final   Culture  Setup Time     Final   Value: 08/22/2012 04:30     Performed at [facility]   Culture     Final   Value:         BLOOD CULTURE RECEIVED NO GROWTH TO DATE CULTURE WILL BE HELD FOR 5 DAYS BEFORE ISSUING A FINAL NEGATIVE REPORT     Performed at [facility]   Report Status PENDING   Incomplete  CULTURE, BLOOD (ROUTINE X 2)     Status: None   Collection Time    08/21/12 11:50 PM      Result Value Range Status   Specimen Description BLOOD LEFT HAND   Final   Special Requests BOTTLES DRAWN AEROBIC AND ANAEROBIC   Final   Culture  Setup Time     Final   Value: 08/22/2012 04:30     Performed at [facility]   Culture     Final   Value:        BLOOD CULTURE RECEIVED NO GROWTH TO DATE CULTURE WILL BE HELD FOR 5 DAYS BEFORE ISSUING A FINAL NEGATIVE REPORT     Performed at [facility]   Report Status PENDING   Incomplete    Kathyrn Warmuth, Molly Maduro, MD [facility] [email or web address] [phone] pager  [phone] cell 08/23/2012, 3:17 PM

## 2012-08-23 NOTE — Progress Notes (Signed)
Received referral from [facility] program for [facility] Care Management services. Spoke with patient and wife at bedside. Wife states "we are here for lung cancer, not COPD". Explained the nature of the referral and services provided by [facility]. Wife states they will talk to Dr Shirline Frees to see if they will accept services or not. Not interested in accepting [facility] Care Management services at this time. Left brochure for patient to call if interested in the future.  Raiford Noble, MSN-Ed, RN,BSN- [facility] [phone]

## 2012-08-23 NOTE — Consult Note (Signed)
Reason for Consult:  Ischemic left toe Referring Physician:   LAZARIUS [name] is an 62 y.o. male.  HPI:   The patient is a 62 yo male with a history of PAD, HLD, HTN, CVA, COPD, metastatic NSCLC, squamous cell carcinoma.  He had [place] directional atherectomy to the right SFA on Sept 27, 2013, left SFA intervention in Sept 2012 with stent placement June 2013.  Follow up dopplers showed a right ABI of 0.92.     We are asked to evaluate for possible ischemia of the left lower extremity.  Xray of the left foot shows no osteomyelitis.  He was originally admitted and treated for cellulitis.  Past Medical History  Diagnosis Date  . BENIGN PROSTATIC HYPERTROPHY 12/24/2009  . CEREBROVASCULAR ACCIDENT, HX OF 12/24/2009  . HYPERLIPIDEMIA 12/24/2009  . HYPERTENSION 12/24/2009  . Testosterone deficiency   . ED (erectile dysfunction)   . Claudication in peripheral vascular disease, lifestyle limiting, with abnormal arterial dopplers of lower ext. 06/30/2011  . S/P angioplasty with stent to Lt. SFA 06/30/11 06/30/2011  . COPD 12/24/2009  . Shortness of breath     "from the COPD" (10/13/2011)  . Stroke 1996    "right side not as strong as left since; numbness R leg/foot" (10/13/2011)  . Pernicious anemia     Past Surgical History  Procedure Laterality Date  . Cervical fusion  1980's    "total of 3-4 OR's on my neck; pinched nerve" (10/13/2011)  . Aneurysm coiling  2007  . Peripheral arterial stent graft  06/30/2010; 10/13/10    left; left  . Video bronchoscopy Bilateral 04/03/2012    Procedure: VIDEO BRONCHOSCOPY WITHOUT FLUORO;  Surgeon: Nyoka Cowden, MD;  Location: [facility];  Service: Cardiopulmonary;  Laterality: Bilateral;    Family History  Problem Relation Age of Onset  . Emphysema Father     smoked  . Emphysema Paternal Uncle     smoked  . Heart disease Mother   . Lung cancer Father     smoked  . Lung cancer Paternal Grandfather     smoked    Social History:  reports that he has  been smoking Cigarettes.  He has a 22.5 pack-year smoking history. He has never used smokeless tobacco. He reports that he does not drink alcohol or use illicit drugs.  Allergies: No Known Allergies  Medications:  Scheduled Meds: . aspirin EC  81 mg Oral Daily  . atorvastatin  20 mg Oral q1800  . clopidogrel  75 mg Oral Daily  . enoxaparin (LOVENOX) injection  40 mg Subcutaneous Q24H  . feeding supplement  237 mL Oral BID BM  . gabapentin  600 mg Oral Daily  . lisinopril  20 mg Oral Daily  . multivitamin with minerals  1 tablet Oral Daily  . nortriptyline  75 mg Oral QHS  . piperacillin-tazobactam (ZOSYN)  IV  3.375 g Intravenous Q8H  . sodium chloride  500 mL Intravenous Once  . tamsulosin  0.4 mg Oral Daily  . vancomycin  1,000 mg Intravenous Q12H   Continuous Infusions: . 0.9 % NaCl with KCl 40 mEq / L 75 mL/hr at 08/23/12 0801   PRN Meds:.acetaminophen, budesonide-formoterol, HYDROmorphone (DILAUDID) injection, ondansetron (ZOFRAN) IV, ondansetron, prochlorperazine, traMADol   Results for orders placed during the hospital encounter of 08/21/12 (from the past 48 hour(s))  CBC WITH DIFFERENTIAL     Status: Abnormal   Collection Time    08/21/12 11:00 PM      Result Value Range  WBC 12.7 (*) 4.0 - 10.5 K/uL   RBC 2.51 (*) 4.22 - 5.81 MIL/uL   Hemoglobin 7.9 (*) 13.0 - 17.0 g/dL   HCT 78.2 (*) 95.6 - 21.3 %   MCV 90.8  78.0 - 100.0 fL   MCH 31.5  26.0 - 34.0 pg   MCHC 34.6  30.0 - 36.0 g/dL   RDW 08.6 (*) 57.8 - 46.9 %   Platelets 128 (*) 150 - 400 K/uL   Neutrophils Relative % 75  43 - 77 %   Neutro Abs 9.5 (*) 1.7 - 7.7 K/uL   Lymphocytes Relative 12  12 - 46 %   Lymphs Abs 1.5  0.7 - 4.0 K/uL   Monocytes Relative 14 (*) 3 - 12 %   Monocytes Absolute 1.7 (*) 0.1 - 1.0 K/uL   Eosinophils Relative 0  0 - 5 %   Eosinophils Absolute 0.0  0.0 - 0.7 K/uL   Basophils Relative 0  0 - 1 %   Basophils Absolute 0.0  0.0 - 0.1 K/uL  BASIC METABOLIC PANEL     Status: Abnormal    Collection Time    08/21/12 11:00 PM      Result Value Range   Sodium 124 (*) 135 - 145 mEq/L   Potassium 3.0 (*) 3.5 - 5.1 mEq/L   Chloride 89 (*) 96 - 112 mEq/L   CO2 24  19 - 32 mEq/L   Glucose, Bld 133 (*) 70 - 99 mg/dL   BUN 12  6 - 23 mg/dL   Creatinine, Ser 6.29  0.50 - 1.35 mg/dL   Calcium 8.8  8.4 - 52.8 mg/dL   GFR calc non Af Amer >90  >90 mL/min   GFR calc Af Amer >90  >90 mL/min   Comment:            The eGFR has been calculated     using the CKD EPI equation.     This calculation has not been     validated in all clinical     situations.     eGFR's persistently     <90 mL/min signify     possible Chronic Kidney Disease.  HEPATIC FUNCTION PANEL     Status: Abnormal   Collection Time    08/21/12 11:00 PM      Result Value Range   Total Protein 6.8  6.0 - 8.3 g/dL   Albumin 2.5 (*) 3.5 - 5.2 g/dL   AST 9  0 - 37 U/L   ALT 8  0 - 53 U/L   Alkaline Phosphatase 128 (*) 39 - 117 U/L   Total Bilirubin 0.3  0.3 - 1.2 mg/dL   Bilirubin, Direct 0.1  0.0 - 0.3 mg/dL   Indirect Bilirubin 0.2 (*) 0.3 - 0.9 mg/dL  CULTURE, BLOOD (ROUTINE X 2)     Status: None   Collection Time    08/21/12 11:15 PM      Result Value Range   Specimen Description BLOOD RIGHT HAND     Special Requests BOTTLES DRAWN AEROBIC AND ANAEROBIC     Culture  Setup Time       Value: 08/22/2012 04:30     Performed at [facility]   Culture       Value:        BLOOD CULTURE RECEIVED NO GROWTH TO DATE CULTURE WILL BE HELD FOR 5 DAYS BEFORE ISSUING A FINAL NEGATIVE REPORT     Performed at [facility]   Report Status PENDING    CULTURE, BLOOD (ROUTINE X 2)     Status: None   Collection Time    08/21/12 11:50 PM      Result Value Range   Specimen Description BLOOD LEFT HAND     Special Requests BOTTLES DRAWN AEROBIC AND ANAEROBIC     Culture  Setup Time       Value: 08/22/2012 04:30     Performed at [facility]   Culture       Value:        BLOOD CULTURE  RECEIVED NO GROWTH TO DATE CULTURE WILL BE HELD FOR 5 DAYS BEFORE ISSUING A FINAL NEGATIVE REPORT     Performed at [facility]   Report Status PENDING    MAGNESIUM     Status: Abnormal   Collection Time    08/22/12 12:00 AM      Result Value Range   Magnesium 0.9 (*) 1.5 - 2.5 mg/dL   Comment: CRITICAL RESULT CALLED TO, READ BACK BY AND VERIFIED WITH:     Bradley Ferris RN [id number] 08/22/12 [facility]  URINALYSIS, ROUTINE W REFLEX MICROSCOPIC     Status: None   Collection Time    08/22/12  8:10 AM      Result Value Range   Color, Urine YELLOW  YELLOW   APPearance CLEAR  CLEAR   Specific Gravity, Urine 1.017  1.005 - 1.030   pH 6.5  5.0 - 8.0   Glucose, UA NEGATIVE  NEGATIVE mg/dL   Hgb urine dipstick NEGATIVE  NEGATIVE   Bilirubin Urine NEGATIVE  NEGATIVE   Ketones, ur NEGATIVE  NEGATIVE mg/dL   Protein, ur NEGATIVE  NEGATIVE mg/dL   Urobilinogen, UA 0.2  0.0 - 1.0 mg/dL   Nitrite NEGATIVE  NEGATIVE   Leukocytes, UA NEGATIVE  NEGATIVE   Comment: MICROSCOPIC NOT DONE ON URINES WITH NEGATIVE PROTEIN, BLOOD, LEUKOCYTES, NITRITE, OR GLUCOSE <1000 mg/dL.  MAGNESIUM     Status: None   Collection Time    08/22/12 11:30 AM      Result Value Range   Magnesium 1.7  1.5 - 2.5 mg/dL  TYPE AND SCREEN     Status: None   Collection Time    08/22/12 11:30 AM      Result Value Range   ABO/RH(D) A POS     Antibody Screen NEG     Sample Expiration 08/25/2012     Unit Number [id number]     Blood Component Type RED CELLS,LR     Unit division 00     Status of Unit ISSUED,FINAL     Transfusion Status OK TO TRANSFUSE     Crossmatch Result Compatible     Unit Number [id number]     Blood Component Type RED CELLS,LR     Unit division 00     Status of Unit ISSUED,FINAL     Transfusion Status OK TO TRANSFUSE     Crossmatch Result Compatible    PREPARE RBC (CROSSMATCH)     Status: None   Collection Time    08/22/12 12:00 PM      Result Value Range   Order Confirmation ORDER PROCESSED BY  BLOOD BANK    OCCULT BLOOD X 1 CARD TO LAB, STOOL     Status: None   Collection Time    08/22/12  3:05 PM      Result Value Range   Fecal Occult Bld NEGATIVE  NEGATIVE  CBC  Status: Abnormal   Collection Time    08/23/12  5:10 AM      Result Value Range   WBC 14.3 (*) 4.0 - 10.5 K/uL   RBC 3.35 (*) 4.22 - 5.81 MIL/uL   Hemoglobin 10.3 (*) 13.0 - 17.0 g/dL   Comment: DELTA CHECK NOTED     POST TRANSFUSION SPECIMEN   HCT 30.3 (*) 39.0 - 52.0 %   MCV 90.4  78.0 - 100.0 fL   MCH 30.7  26.0 - 34.0 pg   MCHC 34.0  30.0 - 36.0 g/dL   RDW 25.3 (*) 66.4 - 40.3 %   Platelets 97 (*) 150 - 400 K/uL   Comment: DELTA CHECK NOTED     SPECIMEN CHECKED FOR CLOTS     REPEATED TO VERIFY     PLATELET COUNT CONFIRMED BY SMEAR  BASIC METABOLIC PANEL     Status: Abnormal   Collection Time    08/23/12  5:10 AM      Result Value Range   Sodium 128 (*) 135 - 145 mEq/L   Potassium 3.5  3.5 - 5.1 mEq/L   Chloride 94 (*) 96 - 112 mEq/L   CO2 25  19 - 32 mEq/L   Glucose, Bld 104 (*) 70 - 99 mg/dL   BUN 5 (*) 6 - 23 mg/dL   Creatinine, Ser 4.74  0.50 - 1.35 mg/dL   Calcium 8.6  8.4 - 25.9 mg/dL   GFR calc non Af Amer >90  >90 mL/min   GFR calc Af Amer >90  >90 mL/min   Comment:            The eGFR has been calculated     using the CKD EPI equation.     This calculation has not been     validated in all clinical     situations.     eGFR's persistently     <90 mL/min signify     possible Chronic Kidney Disease.    Dg Chest 1 View  08/21/2012   *RADIOLOGY REPORT*  Clinical Data: Fever  CHEST - 1 VIEW  Comparison: 07/08/2012, 06/14/2012  Findings: Hyperinflation noted compatible with background emphysema.  The bilateral pulmonary nodules are better demonstrated on the CT comparison.  Left mediastinal mass is not well appreciated by plain radiography.  No definite superimposed CHF or pneumonia.  No collapse or consolidation.  No significant effusion or pneumothorax.  Trachea is midline.  IMPRESSION:  Stable COPD/emphysema.  Spiculated bilateral pulmonary nodules are better appreciated by CT comparison.  No superimposed acute process   Original Report Authenticated By: Judie Petit. Shick, M.D.   Dg Foot Complete Left  08/21/2012   *RADIOLOGY REPORT*  Clinical Data: Plantar ulceration left foot.  LEFT FOOT - COMPLETE 3+ VIEW  Comparison: Plain films left foot 08/16/2012.  Findings: No soft tissue gas collection or radiopaque foreign body is seen.  No bony destructive change is identified.  Small plantar calcaneal spur is noted.  Skin ulceration is not discretely visualized.  IMPRESSION: Negative exam.   Original Report Authenticated By: Holley Dexter, M.D.    Review of Systems  Constitutional: Negative for fever.  HENT: Negative for congestion and sore throat.   Respiratory: Negative for cough and shortness of breath.   Cardiovascular: Positive for leg swelling (Left). Negative for chest pain and orthopnea.  Gastrointestinal: Negative for nausea, vomiting and abdominal pain.  Genitourinary: Negative for hematuria.  All other systems reviewed and are negative.   Blood pressure 138/51, pulse  123, temperature 99.5 F (37.5 C), temperature source Oral, resp. rate 18, height 5\' 6"  (1.676 m), weight 136 lb 14.5 oz (62.1 kg), SpO2 96.00%. Physical Exam  Constitutional: He is oriented to person, place, and time. He appears well-developed and well-nourished. No distress.  HENT:  Head: Normocephalic and atraumatic.  Eyes: EOM are normal. Pupils are equal, round, and reactive to light. No scleral icterus.  Neck: Normal range of motion. Neck supple.  Cardiovascular: Regular rhythm, S1 normal and S2 normal.  Tachycardia present.   No murmur heard. Pulses:      Radial pulses are 2+ on the right side, and 2+ on the left side.       Popliteal pulses are 0 on the right side, and 0 on the left side.       Dorsalis pedis pulses are 0 on the right side.       Posterior tibial pulses are 0 on the right side, and  1+ on the left side.  No Carotid Bruit.    Respiratory: Effort normal and breath sounds normal. He has no wheezes. He has no rales.  GI: Soft. Bowel sounds are normal. He exhibits no distension. There is no tenderness.  Musculoskeletal: He exhibits edema (Left lower extremity).  Lymphadenopathy:    He has no cervical adenopathy.  Neurological: He is alert and oriented to person, place, and time.  Skin: Skin is warm and dry.  Psychiatric: He has a normal mood and affect.    Assessment/Plan: Active Problems:   HYPERTENSION   COPD   PVD, s/p Rt SFA PTA/ stenting 10/13/11  -Ischemic left fifth digit   Cellulitis    Plan:  We will obtain records of his last LEA dopplers completed at our office.  Order LEA dopplers now.  Likely PV angiogram on Monday.   On vanc and zosyn.      [name] 08/23/2012, 3:47 PM      Agree with note written by Jones Skene PAC  Pt well known to me with extensive H/O PAD s/p directional and rotational atherectomy, PTA and stenting of both SFA's by myself several years ago. His most recent dopplers in our office showed the stents to be patent. He was last seen in the office 10/13. Admitted now with cellulitis left foot and gangrene left 5th toe. On exam be has swelling and erythema left foot c/w cellulitis on ATBX. XRAYS do not suggest osteo. He has no pedal pulses on left or popliteal pulse. I suspect that his left SFA stent is occluded but will need to obtain arterial dopplers. Assuming that is the case , I will need to perform peripheral angio to confirm and possibly re establish antegrade flow to facilitate healing and treatment of infxn.  Runell Gess 08/24/2012 8:07 AM

## 2012-08-23 NOTE — Progress Notes (Addendum)
[facility] PROGRESS NOTE  CLEVESTER HELZER WUJ:[id number] DOB: 1950/09/10 DOA: 08/21/2012 PCP: [name], MD  Brief narrative: 62 y.o. year old male with COPD, lung cancer, chemotherapy induced neutropenia, peripheral artery disease, CVA, presenting with cellulitis of the left foot. Patient had recent admission for left foot cellulitis and was discharged home on doxycycline. He explains that his swelling and pain has not gotten better and erythema has progressed to involve the entire foot and extending slightly above the ankle. He also reported subjective fevers and chills.  Patient noted he is currently receiving chemotherapy for his lung cancer. Has had to hold on treatment for the past 3 weeks secondary to low cell counts and infection. No known trauma. In the ER , patient was noted white count 12.7. Left foot x-ray negative for any osteomyelitic changes.   Assessment and Plan:  Principal Problem:  Left foot cellulitis, left fifth toe ischemia - plan on obtainine LE arterial doppler and likely PV angiogram Monday - X ray of the left foot shows no evidence of osteomyelitis - continue vanco and zosyn - appreciate infectious disease, vascular surgery input  Active Problems: Hyponatremia  - Likely secondary to prerenal etiology - sodium slowly trending up from 124 to 128 - continue IV fluids and follow up BMP in am Hypokalemia and hypomagnesemia  - now both WNL - BMP in AM Leukocytosis  - Secondary to left foot cellulitis and ischemic left toe - antibiotics as above Acute blood loss anemia  - Possibly chemotherapy related, - status post 2 units of PRBC  - hemoglobin now 10.3 Thrombocytopenia  - likely sequela of chemotherapy - will have to monitor closely as pt in on Lovenox - if persistent drop we may need to hold Lovenox - CBC in AM COPD, emphysema  - Clinically stable at this time, oxygen saturations at target range   Consultants:  Cardiology Infectious  disease Oncology Procedures/Studies:  Dg Chest 1 View  08/21/2012 Stable COPD/emphysema. Spiculated bilateral pulmonary nodules are better appreciated by CT comparison. No superimposed acute process   Dg Foot Complete Left  08/21/2012 Negative exam.   Antibiotics:  Vancomycin 08/06 -->  Zosyn 08/06 -->   Code Status: Full  Family Communication: Pt and wife at bedside  Disposition Plan: Home when medically stable  [name] [facility] [phone]  HPI/Subjective: No events overnight.   Objective: Filed Vitals:   08/22/12 2219 08/23/12 0729 08/23/12 1247 08/23/12 1616  BP: 121/66 134/70 138/51 153/72  Pulse: 127 121 123 128  Temp: 101.6 F (38.7 C) 99.3 F (37.4 C) 99.5 F (37.5 C) 99.4 F (37.4 C)  TempSrc: Oral Oral Oral Oral  Resp: 16 18 18 16   Height:      Weight:      SpO2: 93% 96% 96% 96%    Intake/Output Summary (Last 24 hours) at 08/23/12 1657 Last data filed at 08/23/12 1500  Gross per 24 hour  Intake   1662 ml  Output   1650 ml  Net     12 ml    Exam:   General:  Pt is alert, follows commands appropriately, not in acute distress  Cardiovascular: Regular rhythm, tachycardic, S1/S2, no murmurs, no rubs, no gallops  Respiratory: Clear to auscultation bilaterally, no wheezing, no crackles, decreased breath sounds at bases   Abdomen: Soft, non tender, non distended, bowel sounds present, no guarding  Extremities: Little left toe ischemic, Let foot erythema and tenderness to palpation, extending to ankle area   Neuro: Grossly nonfocal  Data Reviewed: Basic Metabolic Panel:  Recent Labs Lab 08/17/12 0520 08/21/12 2300 08/22/12 08/22/12 1130 08/23/12 0510  NA 134* 124*  --   --  128*  K 3.8 3.0*  --   --  3.5  CL 99 89*  --   --  94*  CO2 26 24  --   --  25  GLUCOSE 93 133*  --   --  104*  BUN 5* 12  --   --  5*  CREATININE 0.59 0.69  --   --  0.53  CALCIUM 9.0 8.8  --   --  8.6  MG  --   --  0.9* 1.7  --    Liver Function Tests:  Recent  Labs Lab 08/21/12 2300  AST 9  ALT 8  ALKPHOS 128*  BILITOT 0.3  PROT 6.8  ALBUMIN 2.5*   CBC:  Recent Labs Lab 08/17/12 0520 08/21/12 2300 08/23/12 0510  WBC 11.2* 12.7* 14.3*  NEUTROABS  --  9.5*  --   HGB 8.2* 7.9* 10.3*  HCT 24.6* 22.8* 30.3*  MCV 92.1 90.8 90.4  PLT 168 128* 97*    Recent Results (from the past 240 hour(s))  CULTURE, BLOOD (ROUTINE X 2)     Status: None   Collection Time    08/21/12 11:15 PM      Result Value Range Status   Specimen Description BLOOD RIGHT HAND   Final   Special Requests BOTTLES DRAWN AEROBIC AND ANAEROBIC   Final   Culture  Setup Time     Final   Value: 08/22/2012 04:30     Performed at [facility]   Culture     Final   Value:        BLOOD CULTURE RECEIVED NO GROWTH TO DATE CULTURE WILL BE HELD FOR 5 DAYS BEFORE ISSUING A FINAL NEGATIVE REPORT     Performed at [facility]   Report Status PENDING   Incomplete  CULTURE, BLOOD (ROUTINE X 2)     Status: None   Collection Time    08/21/12 11:50 PM      Result Value Range Status   Specimen Description BLOOD LEFT HAND   Final   Special Requests BOTTLES DRAWN AEROBIC AND ANAEROBIC   Final   Culture  Setup Time     Final   Value: 08/22/2012 04:30     Performed at [facility]   Culture     Final   Value:        BLOOD CULTURE RECEIVED NO GROWTH TO DATE CULTURE WILL BE HELD FOR 5 DAYS BEFORE ISSUING A FINAL NEGATIVE REPORT     Performed at [facility]   Report Status PENDING   Incomplete     Scheduled Meds: . aspirin EC  81 mg Oral Daily  . atorvastatin  20 mg Oral q1800  . clopidogrel  75 mg Oral Daily  . enoxaparin (LOVENOX) injection  40 mg Subcutaneous Q24H  . feeding supplement  237 mL Oral BID BM  . gabapentin  600 mg Oral Daily  . lisinopril  20 mg Oral Daily  . multivitamin with minerals  1 tablet Oral Daily  . nortriptyline  75 mg Oral QHS  . piperacillin-tazobactam (ZOSYN)  IV  3.375 g Intravenous Q8H  . sodium chloride   500 mL Intravenous Once  . tamsulosin  0.4 mg Oral Daily  . vancomycin  1,000 mg Intravenous Q12H   Continuous Infusions: .  0.9 % NaCl with KCl 40 mEq / L 75 mL/hr at 08/23/12 0801     Debbora Presto, MD  [facility] Pager [phone]  If 7PM-7AM, please contact night-coverage [email or web address] Password [facility] 08/23/2012, 4:57 PM   LOS: 2 days

## 2012-08-24 ENCOUNTER — Inpatient Hospital Stay (HOSPITAL_COMMUNITY): Payer: Medicare Other

## 2012-08-24 DIAGNOSIS — I10 Essential (primary) hypertension: Secondary | ICD-10-CM

## 2012-08-24 DIAGNOSIS — Z9889 Other specified postprocedural states: Secondary | ICD-10-CM

## 2012-08-24 DIAGNOSIS — L02619 Cutaneous abscess of unspecified foot: Principal | ICD-10-CM

## 2012-08-24 DIAGNOSIS — I739 Peripheral vascular disease, unspecified: Secondary | ICD-10-CM

## 2012-08-24 DIAGNOSIS — L97509 Non-pressure chronic ulcer of other part of unspecified foot with unspecified severity: Secondary | ICD-10-CM

## 2012-08-24 LAB — CBC
Platelets: 101 10*3/uL — ABNORMAL LOW (ref 150–400)
RBC: 3.11 MIL/uL — ABNORMAL LOW (ref 4.22–5.81)
RDW: 18 % — ABNORMAL HIGH (ref 11.5–15.5)
WBC: 20.5 10*3/uL — ABNORMAL HIGH (ref 4.0–10.5)

## 2012-08-24 LAB — BASIC METABOLIC PANEL
CO2: 26 mEq/L (ref 19–32)
Chloride: 91 mEq/L — ABNORMAL LOW (ref 96–112)
GFR calc Af Amer: >90 mL/min (ref >90)
Sodium: 126 mEq/L — ABNORMAL LOW (ref 135–145)

## 2012-08-24 LAB — VANCOMYCIN, TROUGH: Vancomycin Tr: 10.7 ug/mL (ref 10.0–20.0)

## 2012-08-24 MED ORDER — OXYCODONE-ACETAMINOPHEN 5-325 MG PO TABS
1.0000 | ORAL_TABLET | ORAL | Status: DC | PRN
  Administered 2012-08-26: 2 via ORAL
  Filled 2012-08-24: qty 2

## 2012-08-24 MED ORDER — HYDROMORPHONE HCL PF 1 MG/ML IJ SOLN
1.0000 mg | INTRAMUSCULAR | Status: DC | PRN
  Administered 2012-08-24 – 2012-08-26 (×8): 1 mg via INTRAVENOUS
  Administered 2012-08-26: 2 mg via INTRAVENOUS
  Administered 2012-08-26 – 2012-08-27 (×5): 1 mg via INTRAVENOUS
  Administered 2012-08-27: 2 mg via INTRAVENOUS
  Filled 2012-08-24 (×9): qty 1
  Filled 2012-08-24: qty 2
  Filled 2012-08-24: qty 1
  Filled 2012-08-24: qty 2
  Filled 2012-08-24: qty 1
  Filled 2012-08-24: qty 2

## 2012-08-24 NOTE — Progress Notes (Addendum)
Patient ID: [name], male   DOB: Jul 21, 1950, 62 y.o.   MRN: [id number] [facility] PROGRESS NOTE  [name]:[id number] DOB: Jan 14, 1951 DOA: 08/21/2012 PCP: [name], MD  Brief narrative:  62 y.o. year old male with COPD, lung cancer, chemotherapy induced neutropenia, peripheral artery disease, CVA, presenting with cellulitis of the left foot. Patient had recent admission for left foot cellulitis and was discharged home on doxycycline. He explains that his swelling and pain has not gotten better and erythema has progressed to involve the entire foot and extending slightly above the ankle. He also reported subjective fevers and chills.   Patient noted he is currently receiving chemotherapy for his lung cancer. Has had to hold on treatment for the past 3 weeks secondary to low cell counts and infection. No known trauma. In the ER , patient was noted white count 12.7. Left foot x-ray negative for any osteomyelitic changes.   Assessment and Plan:  Principal Problem:  Left foot cellulitis, left fifth toe ischemia  - erythema and left toe ischemia appear to look worse this AM and WBC trending up  - plan on obtainine LE arterial doppler and PV angiogram Monday  - X ray of the left foot shows no evidence of osteomyelitis  - continue vanco and zosyn as recommended by ID - appreciate infectious disease, cardiology input  - will obtain MRI of the foot as XRAY may not clearly identify osteomyelitis Active Problems:  Hyponatremia  - Likely secondary to prerenal etiology  - sodium slowly trending up from 124 to 128 --> 126 - continue IV fluids and follow up BMP in am  Hypokalemia and hypomagnesemia  - now both WNL  - BMP in AM  Leukocytosis  - Secondary to left foot cellulitis and ischemic left toe  - WBC trending up and likely secondary to principal problem  - antibiotics as above  Acute blood loss anemia  - Possibly chemotherapy related,  - status post 2 units  of PRBC with appropriate increase in Hg/Hct  - Hg remains stable but slight drop overnight 10.3 --> 9.7 this AM Thrombocytopenia  - likely sequela of chemotherapy  - will have to monitor closely as pt in on Lovenox  - platelet count stable overnight  - CBC in AM  COPD, emphysema  - Clinically stable at this time, oxygen saturations at target range   Consultants:  Cardiology  Infectious disease  Oncology Procedures/Studies:  Dg Chest 1 View  08/21/2012 Stable COPD/emphysema. Spiculated bilateral pulmonary nodules are better appreciated by CT comparison. No superimposed acute process  Dg Foot Complete Left  08/21/2012 Negative exam.  Antibiotics:  Vancomycin 08/06 -->  Zosyn 08/06 -->  Code Status: Full  Family Communication: Pt and wife at bedside  Disposition Plan: Home when medically stable   [name]  [facility]  [phone]   HPI/Subjective: No events overnight.   Objective: Filed Vitals:   08/23/12 1247 08/23/12 1616 08/23/12 2200 08/24/12 0541  BP: 138/51 153/72 115/67 122/69  Pulse: 123 128 127 118  Temp: 99.5 F (37.5 C) 99.4 F (37.4 C) 100.2 F (37.9 C) 99.2 F (37.3 C)  TempSrc: Oral Oral Oral Oral  Resp: 18 16 18 18   Height:      Weight:      SpO2: 96% 96% 94% 93%    Intake/Output Summary (Last 24 hours) at 08/24/12 0637 Last data filed at 08/24/12 0600  Gross per 24 hour  Intake   2395 ml  Output  1425 ml  Net    970 ml    Exam:   General:  Pt is alert, follows commands appropriately, not in acute distress  Cardiovascular: Regular rate and rhythm, S1/S2, no murmurs, no rubs, no gallops  Respiratory: Clear to auscultation bilaterally, no wheezing, no crackles, no rhonchi  Abdomen: Soft, non tender, non distended, bowel sounds present, no guarding  Extremities: Left foot erythema and cyanotic left fifth toe, no pulse palpable, tender to palpation and warm to touch   Neuro: Grossly nonfocal  Data Reviewed: Basic Metabolic Panel:  Recent  Labs Lab 08/21/12 2300 08/22/12 08/22/12 1130 08/23/12 0510  NA 124*  --   --  128*  K 3.0*  --   --  3.5  CL 89*  --   --  94*  CO2 24  --   --  25  GLUCOSE 133*  --   --  104*  BUN 12  --   --  5*  CREATININE 0.69  --   --  0.53  CALCIUM 8.8  --   --  8.6  MG  --  0.9* 1.7  --    Liver Function Tests:  Recent Labs Lab 08/21/12 2300  AST 9  ALT 8  ALKPHOS 128*  BILITOT 0.3  PROT 6.8  ALBUMIN 2.5*   CBC:  Recent Labs Lab 08/21/12 2300 08/23/12 0510 08/24/12 0520  WBC 12.7* 14.3* 20.5*  NEUTROABS 9.5*  --   --   HGB 7.9* 10.3* 9.7*  HCT 22.8* 30.3* 28.1*  MCV 90.8 90.4 90.4  PLT 128* 97* 101*   Recent Results (from the past 240 hour(s))  CULTURE, BLOOD (ROUTINE X 2)     Status: None   Collection Time    08/21/12 11:15 PM      Result Value Range Status   Specimen Description BLOOD RIGHT HAND   Final   Special Requests BOTTLES DRAWN AEROBIC AND ANAEROBIC   Final   Culture  Setup Time     Final   Value: 08/22/2012 04:30     Performed at [facility]   Culture     Final   Value:        BLOOD CULTURE RECEIVED NO GROWTH TO DATE CULTURE WILL BE HELD FOR 5 DAYS BEFORE ISSUING A FINAL NEGATIVE REPORT     Performed at [facility]   Report Status PENDING   Incomplete  CULTURE, BLOOD (ROUTINE X 2)     Status: None   Collection Time    08/21/12 11:50 PM      Result Value Range Status   Specimen Description BLOOD LEFT HAND   Final   Special Requests BOTTLES DRAWN AEROBIC AND ANAEROBIC   Final   Culture  Setup Time     Final   Value: 08/22/2012 04:30     Performed at [facility]   Culture     Final   Value:        BLOOD CULTURE RECEIVED NO GROWTH TO DATE CULTURE WILL BE HELD FOR 5 DAYS BEFORE ISSUING A FINAL NEGATIVE REPORT     Performed at [facility]   Report Status PENDING   Incomplete     Scheduled Meds: . aspirin EC  81 mg Oral Daily  . atorvastatin  20 mg Oral q1800  . clopidogrel  75 mg Oral Daily  .  enoxaparin (LOVENOX) injection  40 mg Subcutaneous Q24H  . feeding supplement  237 mL Oral BID  BM  . gabapentin  600 mg Oral Daily  . lisinopril  20 mg Oral Daily  . multivitamin with minerals  1 tablet Oral Daily  . nortriptyline  75 mg Oral QHS  . piperacillin-tazobactam (ZOSYN)  IV  3.375 g Intravenous Q8H  . sodium chloride  500 mL Intravenous Once  . tamsulosin  0.4 mg Oral Daily  . vancomycin  1,000 mg Intravenous Q12H   Continuous Infusions: . 0.9 % NaCl with KCl 40 mEq / L 75 mL/hr at 08/23/12 2311     Debbora Presto, MD  [facility] Pager [phone]  If 7PM-7AM, please contact night-coverage [email or web address] Password [facility] 08/24/2012, 6:37 AM   LOS: 3 days

## 2012-08-25 ENCOUNTER — Other Ambulatory Visit: Payer: Self-pay

## 2012-08-25 LAB — BASIC METABOLIC PANEL
BUN: 7 mg/dL (ref 6–23)
Chloride: 90 mEq/L — ABNORMAL LOW (ref 96–112)
GFR calc Af Amer: >90 mL/min (ref >90)
GFR calc non Af Amer: >90 mL/min (ref >90)
Glucose, Bld: 113 mg/dL — ABNORMAL HIGH (ref 70–99)
Potassium: 3.5 mEq/L (ref 3.5–5.1)
Sodium: 123 mEq/L — ABNORMAL LOW (ref 135–145)

## 2012-08-25 LAB — CBC
HCT: 26 % — ABNORMAL LOW (ref 39.0–52.0)
Hemoglobin: 8.7 g/dL — ABNORMAL LOW (ref 13.0–17.0)
RDW: 17.7 % — ABNORMAL HIGH (ref 11.5–15.5)
WBC: 15.4 10*3/uL — ABNORMAL HIGH (ref 4.0–10.5)

## 2012-08-25 MED ORDER — CHLORHEXIDINE GLUCONATE 4 % EX LIQD
60.0000 mL | Freq: Once | CUTANEOUS | Status: AC
  Administered 2012-08-25: 4 via TOPICAL
  Filled 2012-08-25 (×2): qty 60

## 2012-08-25 MED ORDER — VANCOMYCIN HCL 10 G IV SOLR
1250.0000 mg | Freq: Two times a day (BID) | INTRAVENOUS | Status: DC
  Administered 2012-08-25 – 2012-08-29 (×8): 1250 mg via INTRAVENOUS
  Filled 2012-08-25 (×11): qty 1250

## 2012-08-25 MED ORDER — LORAZEPAM 0.5 MG PO TABS
0.5000 mg | ORAL_TABLET | Freq: Two times a day (BID) | ORAL | Status: DC | PRN
  Administered 2012-08-25: 0.5 mg via ORAL
  Filled 2012-08-25: qty 1

## 2012-08-25 MED ORDER — SODIUM CHLORIDE 0.9 % IV SOLN
INTRAVENOUS | Status: DC
  Administered 2012-08-26: 05:00:00 via INTRAVENOUS

## 2012-08-25 NOTE — Progress Notes (Signed)
ANTIBIOTIC CONSULT NOTE - FOLLOW UP  Pharmacy Consult for vancomycin Indication: Cellulitis  No Known Allergies  Patient Measurements: Height: 5\' 6"  (167.6 cm) Weight: 136 lb 14.5 oz (62.1 kg) IBW/kg (Calculated) : 63.8 Adjusted Body Weight:   Vital Signs: Temp: 98.6 F (37 C) (08/09 2144) Temp src: Oral (08/09 2144) BP: 113/66 mmHg (08/09 2144) Pulse Rate: 118 (08/09 2144) Intake/Output from previous day: 08/09 0701 - 08/10 0700 In: 1477.5 [I.V.:1177.5; IV Piggyback:300] Out: 400 [Urine:400] Intake/Output from this shift: Total I/O In: 577.5 [I.V.:577.5] Out: -   Labs:  Recent Labs  08/23/12 0510 08/24/12 0520  WBC 14.3* 20.5*  HGB 10.3* 9.7*  PLT 97* 101*  CREATININE 0.53 0.58   Estimated Creatinine Clearance: 83 ml/min (by C-G formula based on Cr of 0.58).  Recent Labs  08/24/12 2300  VANCOTROUGH 10.7     Microbiology: Recent Results (from the past 720 hour(s))  CULTURE, BLOOD (ROUTINE X 2)     Status: None   Collection Time    08/21/12 11:15 PM      Result Value Range Status   Specimen Description BLOOD RIGHT HAND   Final   Special Requests BOTTLES DRAWN AEROBIC AND ANAEROBIC   Final   Culture  Setup Time     Final   Value: 08/22/2012 04:30     Performed at [facility]   Culture     Final   Value:        BLOOD CULTURE RECEIVED NO GROWTH TO DATE CULTURE WILL BE HELD FOR 5 DAYS BEFORE ISSUING A FINAL NEGATIVE REPORT     Performed at [facility]   Report Status PENDING   Incomplete  CULTURE, BLOOD (ROUTINE X 2)     Status: None   Collection Time    08/21/12 11:50 PM      Result Value Range Status   Specimen Description BLOOD LEFT HAND   Final   Special Requests BOTTLES DRAWN AEROBIC AND ANAEROBIC   Final   Culture  Setup Time     Final   Value: 08/22/2012 04:30     Performed at [facility]   Culture     Final   Value:        BLOOD CULTURE RECEIVED NO GROWTH TO DATE CULTURE WILL BE HELD FOR 5 DAYS BEFORE  ISSUING A FINAL NEGATIVE REPORT     Performed at [facility]   Report Status PENDING   Incomplete    Anti-infectives   Start     Dose/Rate Route Frequency Ordered Stop   08/25/12 1000  vancomycin (VANCOCIN) 1,250 mg in sodium chloride 0.9 % 250 mL IVPB     1,250 mg 166.7 mL/hr over 90 Minutes Intravenous Every 12 hours 08/25/12 0505     08/23/12 1400  piperacillin-tazobactam (ZOSYN) IVPB 3.375 g     3.375 g 12.5 mL/hr over 240 Minutes Intravenous 3 times per day 08/23/12 0712     08/23/12 0715  ceFAZolin (ANCEF) IVPB 2 g/50 mL premix  Status:  Discontinued     2 g 100 mL/hr over 30 Minutes Intravenous 3 times per day 08/23/12 0710 08/23/12 0712   08/22/12 1200  vancomycin (VANCOCIN) IVPB 1000 mg/200 mL premix     1,000 mg 200 mL/hr over 60 Minutes Intravenous Every 12 hours 08/22/12 0532     08/22/12 0600  piperacillin-tazobactam (ZOSYN) IVPB 3.375 g  Status:  Discontinued     3.375 g 12.5 mL/hr over 240 Minutes Intravenous  3 times per day 08/22/12 0532 08/23/12 0710   08/22/12 0115  ciprofloxacin (CIPRO) IVPB 400 mg  Status:  Discontinued     400 mg 200 mL/hr over 60 Minutes Intravenous Every 12 hours 08/22/12 0113 08/22/12 1804   08/22/12 0030  vancomycin (VANCOCIN) IVPB 1000 mg/200 mL premix     1,000 mg 200 mL/hr over 60 Minutes Intravenous  Once 08/22/12 0018 08/22/12 0201   08/22/12 0030  piperacillin-tazobactam (ZOSYN) IVPB 3.375 g     3.375 g 100 mL/hr over 30 Minutes Intravenous  Once 08/22/12 0018 08/22/12 0100      Assessment: Patient with vancomycin level at goal (but on lower end).  Doses charted corrrectly.  Wanting higher end of goal range.  Goal of Therapy:  Vancomycin trough level 10-15 mcg/ml  Plan:  Measure antibiotic drug levels at steady state Follow up culture results Change vancomycin to 1250mg  iv q12hr  Aleene Davidson Crowford 08/25/2012,5:06 AM

## 2012-08-25 NOTE — Progress Notes (Signed)
Patient ID: [name], male   DOB: Oct 15, 1950, 62 y.o.   MRN: [id number]  [facility] PROGRESS NOTE  ASAF ELMQUIST WUJ:[id number] DOB: 07/12/1950 DOA: 08/21/2012 PCP: [name], MD  Brief narrative: 62 y.o. year old male with COPD, lung cancer, chemotherapy induced neutropenia, peripheral artery disease, CVA, presenting with cellulitis of the left foot. Patient had recent admission for left foot cellulitis and was discharged home on doxycycline. He explains that his swelling and pain has not gotten better and erythema has progressed to involve the entire foot and extending slightly above the ankle. He also reported subjective fevers and chills.   Patient noted he is currently receiving chemotherapy for his lung cancer. Has had to hold on treatment for the past 3 weeks secondary to low cell counts and infection. No known trauma. In the ER , patient was noted white count 12.7. Left foot x-ray negative for any osteomyelitic changes.   Assessment and Plan:  Principal Problem:  Left foot cellulitis, osteomyelitis with left fifth toe ischemia  - erythema and left toe ischemia appear to look worse this AM - plan on obtainine LE arterial doppler and PV angiogram Monday  - X ray of the left foot shows no evidence of osteomyelitis but early osteo evident on MRI with possibility if ab abscess  - continue vanco and zosyn as recommended by ID day #5  - appreciate infectious disease, cardiology input, ortho input   Active Problems:  Hyponatremia  - sodium slowly trending down from 124 --> 128 --> 126 --> 123 - hold IVF and encourage PO intake Hypokalemia and hypomagnesemia  - now both WNL  - BMP in AM  Leukocytosis  - Secondary to left foot cellulitis, osteo, and ischemic left toe  - WBC trending down - antibiotics as above  Acute blood loss anemia  - Possibly chemotherapy related,  - status post 2 units of PRBC with appropriate increase in Hg/Hct  - Hg remains stable but  slight drop overnight 10.3 --> 9.7 --> 9.7 Thrombocytopenia  - likely sequela of chemotherapy  - will have to monitor closely as pt in on Lovenox    - CBC in AM  COPD, emphysema  - Clinically stable at this time, oxygen saturations at target range   Consultants:  Cardiology  Infectious disease  Oncology Ortho Procedures/Studies:  Mr Foot Left Wo Contrast  08/24/2012    1.  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 Dorsal phlegmon and possibly early dorsal abscess formation, correlate with any fluctuance overlying the head of the fifth metatarsal.  2.  Diffuse subcutaneous and muscular edema suggesting cellulitis and myositis.    Dg Chest 1 View   08/21/2012  Stable COPD/emphysema. Spiculated bilateral pulmonary nodules are better appreciated by CT comparison. No superimposed acute process   Dg Foot Complete Left  08/21/2012 Negative exam.   Antibiotics:  Vancomycin 08/06 -->  Zosyn 08/06 -->  Code Status: Full  Family Communication: Pt and wife at bedside  Disposition Plan: Home when medically stable   HPI/Subjective: No events overnight.   Objective: Filed Vitals:   08/23/12 2200 08/24/12 0541 08/24/12 1415 08/24/12 2144  BP: 115/67 122/69 124/66 113/66  Pulse: 127 118 117 118  Temp: 100.2 F (37.9 C) 99.2 F (37.3 C) 98.2 F (36.8 C) 98.6 F (37 C)  TempSrc: Oral Oral Oral Oral  Resp: 18 18 18 16   Height:      Weight:      SpO2: 94% 93% 98% 93%  Intake/Output Summary (Last 24 hours) at 08/25/12 3086 Last data filed at 08/25/12 0546  Gross per 24 hour  Intake 2082.5 ml  Output    400 ml  Net 1682.5 ml    Exam:   General:  Pt is alert, follows commands appropriately, not in acute distress  Cardiovascular: Regular hythm, tachycardic, S1/S2, no murmurs, no rubs, no gallops  Respiratory: Clear to auscultation bilaterally, no wheezing, no crackles, no rhonchi  Abdomen: Soft, non tender, non distended, bowel sounds present, no guarding  Extremities:  left foot edema and erythema up to ankle with ischemic left toe, small 1 cm round ulcer noted on the dorsum of the foot, no drain noted   Neuro: Grossly nonfocal  Data Reviewed: Basic Metabolic Panel:  Recent Labs Lab 08/21/12 2300 08/22/12 08/22/12 1130 08/23/12 0510 08/24/12 0520  NA 124*  --   --  128* 126*  K 3.0*  --   --  3.5 3.6  CL 89*  --   --  94* 91*  CO2 24  --   --  25 26  GLUCOSE 133*  --   --  104* 99  BUN 12  --   --  5* 6  CREATININE 0.69  --   --  0.53 0.58  CALCIUM 8.8  --   --  8.6 8.7  MG  --  0.9* 1.7  --   --    Liver Function Tests:  Recent Labs Lab 08/21/12 2300  AST 9  ALT 8  ALKPHOS 128*  BILITOT 0.3  PROT 6.8  ALBUMIN 2.5*   CBC:  Recent Labs Lab 08/21/12 2300 08/23/12 0510 08/24/12 0520 08/25/12 0545  WBC 12.7* 14.3* 20.5* 15.4*  NEUTROABS 9.5*  --   --   --   HGB 7.9* 10.3* 9.7* 8.7*  HCT 22.8* 30.3* 28.1* 26.0*  MCV 90.8 90.4 90.4 90.3  PLT 128* 97* 101* 80*     Recent Results (from the past 240 hour(s))  CULTURE, BLOOD (ROUTINE X 2)     Status: None   Collection Time    08/21/12 11:15 PM      Result Value Range Status   Specimen Description BLOOD RIGHT HAND   Final   Special Requests BOTTLES DRAWN AEROBIC AND ANAEROBIC   Final   Culture  Setup Time     Final   Value: 08/22/2012 04:30     Performed at [facility]   Culture     Final   Value:        BLOOD CULTURE RECEIVED NO GROWTH TO DATE CULTURE WILL BE HELD FOR 5 DAYS BEFORE ISSUING A FINAL NEGATIVE REPORT     Performed at [facility]   Report Status PENDING   Incomplete  CULTURE, BLOOD (ROUTINE X 2)     Status: None   Collection Time    08/21/12 11:50 PM      Result Value Range Status   Specimen Description BLOOD LEFT HAND   Final   Special Requests BOTTLES DRAWN AEROBIC AND ANAEROBIC   Final   Culture  Setup Time     Final   Value: 08/22/2012 04:30     Performed at [facility]   Culture     Final   Value:        BLOOD  CULTURE RECEIVED NO GROWTH TO DATE CULTURE WILL BE HELD FOR 5 DAYS BEFORE ISSUING A FINAL NEGATIVE REPORT     Performed at [facility]   Report Status PENDING   Incomplete     Scheduled Meds: . aspirin EC  81 mg Oral Daily  . atorvastatin  20 mg Oral q1800  . clopidogrel  75 mg Oral Daily  . enoxaparin (LOVENOX) injection  40 mg Subcutaneous Q24H  . feeding supplement  237 mL Oral BID BM  . gabapentin  600 mg Oral Daily  . lisinopril  20 mg Oral Daily  . multivitamin with minerals  1 tablet Oral Daily  . nortriptyline  75 mg Oral QHS  . piperacillin-tazobactam (ZOSYN)  IV  3.375 g Intravenous Q8H  . tamsulosin  0.4 mg Oral Daily  . vancomycin  1,250 mg Intravenous Q12H   Continuous Infusions: . 0.9 % NaCl with KCl 40 mEq / L 75 mL/hr at 08/25/12 0409     Debbora Presto, MD  [facility] Pager [phone]  If 7PM-7AM, please contact night-coverage [email or web address] Password [email or web address] 08/25/2012, 6:09 AM   LOS: 4 days

## 2012-08-25 NOTE — Consult Note (Signed)
Reason for Consult:  Left foot infection Referring Physician:  Dr. Phineas Douglas is an 62 y.o. male.  HPI:  62 y/o male with PMH of diabetes, PVD and smoking c/o a left 5th toe ulcer that started a few weeks ago.  He c/o a long h/o peripheral neuropathy with burning, tingling pain in the bottom of his feet.  This hasn't changed, but he has noted increased swellingin the foot and redness that worsened over the last week.  He has had stenting of his LEs by Dr. Allyson Sabal a year ago.  He is on vanc and zosyn.  Dr. Allyson Sabal plans lower extremity angiography tomorrow.  Past Medical History  Diagnosis Date  . BENIGN PROSTATIC HYPERTROPHY 12/24/2009  . CEREBROVASCULAR ACCIDENT, HX OF 12/24/2009  . HYPERLIPIDEMIA 12/24/2009  . HYPERTENSION 12/24/2009  . Testosterone deficiency   . ED (erectile dysfunction)   . Claudication in peripheral vascular disease, lifestyle limiting, with abnormal arterial dopplers of lower ext. 06/30/2011  . S/P angioplasty with stent to Lt. SFA 06/30/11 06/30/2011  . COPD 12/24/2009  . Shortness of breath     "from the COPD" (10/13/2011)  . Stroke 1996    "right side not as strong as left since; numbness R leg/foot" (10/13/2011)  . Pernicious anemia     Past Surgical History  Procedure Laterality Date  . Cervical fusion  1980's    "total of 3-4 OR's on my neck; pinched nerve" (10/13/2011)  . Aneurysm coiling  2007  . Peripheral arterial stent graft  06/30/2010; 10/13/10    left; left  . Video bronchoscopy Bilateral 04/03/2012    Procedure: VIDEO BRONCHOSCOPY WITHOUT FLUORO;  Surgeon: Nyoka Cowden, MD;  Location: [facility];  Service: Cardiopulmonary;  Laterality: Bilateral;    Family History  Problem Relation Age of Onset  . Emphysema Father     smoked  . Emphysema Paternal Uncle     smoked  . Heart disease Mother   . Lung cancer Father     smoked  . Lung cancer Paternal Grandfather     smoked    Social History:  reports that he has been smoking Cigarettes.   He has a 22.5 pack-year smoking history. He has never used smokeless tobacco. He reports that he does not drink alcohol or use illicit drugs.  Allergies: No Known Allergies  Medications:  reviewed  Results for orders placed during the hospital encounter of 08/21/12 (from the past 48 hour(s))  CBC     Status: Abnormal   Collection Time    08/24/12  5:20 AM      Result Value Range   WBC 20.5 (*) 4.0 - 10.5 K/uL   RBC 3.11 (*) 4.22 - 5.81 MIL/uL   Hemoglobin 9.7 (*) 13.0 - 17.0 g/dL   HCT 16.1 (*) 09.6 - 04.5 %   MCV 90.4  78.0 - 100.0 fL   MCH 31.2  26.0 - 34.0 pg   MCHC 34.5  30.0 - 36.0 g/dL   RDW 40.9 (*) 81.1 - 91.4 %   Platelets 101 (*) 150 - 400 K/uL   Comment: CONSISTENT WITH PREVIOUS RESULT  BASIC METABOLIC PANEL     Status: Abnormal   Collection Time    08/24/12  5:20 AM      Result Value Range   Sodium 126 (*) 135 - 145 mEq/L   Potassium 3.6  3.5 - 5.1 mEq/L   Chloride 91 (*) 96 - 112 mEq/L   CO2 26  19 -  32 mEq/L   Glucose, Bld 99  70 - 99 mg/dL   BUN 6  6 - 23 mg/dL   Creatinine, Ser 1.61  0.50 - 1.35 mg/dL   Calcium 8.7  8.4 - 09.6 mg/dL   GFR calc non Af Amer >90  >90 mL/min   GFR calc Af Amer >90  >90 mL/min   Comment:            The eGFR has been calculated     using the CKD EPI equation.     This calculation has not been     validated in all clinical     situations.     eGFR's persistently     <90 mL/min signify     possible Chronic Kidney Disease.  VANCOMYCIN, TROUGH     Status: None   Collection Time    08/24/12 11:00 PM      Result Value Range   Vancomycin Tr 10.7  10.0 - 20.0 ug/mL  CBC     Status: Abnormal   Collection Time    08/25/12  5:45 AM      Result Value Range   WBC 15.4 (*) 4.0 - 10.5 K/uL   RBC 2.88 (*) 4.22 - 5.81 MIL/uL   Hemoglobin 8.7 (*) 13.0 - 17.0 g/dL   HCT 04.5 (*) 40.9 - 81.1 %   MCV 90.3  78.0 - 100.0 fL   MCH 30.2  26.0 - 34.0 pg   MCHC 33.5  30.0 - 36.0 g/dL   RDW 91.4 (*) 78.2 - 95.6 %   Platelets 80 (*) 150 -  400 K/uL   Comment: SPECIMEN CHECKED FOR CLOTS     CONSISTENT WITH PREVIOUS RESULT  BASIC METABOLIC PANEL     Status: Abnormal   Collection Time    08/25/12  5:45 AM      Result Value Range   Sodium 123 (*) 135 - 145 mEq/L   Potassium 3.5  3.5 - 5.1 mEq/L   Chloride 90 (*) 96 - 112 mEq/L   CO2 26  19 - 32 mEq/L   Glucose, Bld 113 (*) 70 - 99 mg/dL   BUN 7  6 - 23 mg/dL   Creatinine, Ser 2.13  0.50 - 1.35 mg/dL   Calcium 8.6  8.4 - 08.6 mg/dL   GFR calc non Af Amer >90  >90 mL/min   GFR calc Af Amer >90  >90 mL/min   Comment:            The eGFR has been calculated     using the CKD EPI equation.     This calculation has not been     validated in all clinical     situations.     eGFR's persistently     <90 mL/min signify     possible Chronic Kidney Disease.    Mr Foot Left Wo Contrast  08/24/2012   *RADIOLOGY REPORT*  Clinical Data: Left foot erythema and swelling.  Open wound along the distal plantar surface of the foot, query osteomyelitis.  MRI OF THE LEFT FOREFOOT WITHOUT CONTRAST  Technique:  Multiplanar, multisequence MR imaging was performed. No intravenous contrast was administered.  Comparison: 08/21/2012 radiographs  Findings: Reduced T1 and increased T2 signal in the head of the fifth metatarsal noted.  There is abnormal diffuse subcutaneous and muscular edema in the distal foot.  Confluent phlegmon noted dorsal to the fifth metatarsal head, potentially with very early abscess formation in this vicinity on image 20  of series 7- correlate with any fluctuance dorsal to the left fifth metatarsal head.  Lisfranc ligament intact.  No malalignment at the Lisfranc joint.  IMPRESSION:  1.  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 Dorsal phlegmon and possibly early dorsal abscess formation, correlate with any fluctuance overlying the head of the fifth metatarsal. 2.  Diffuse subcutaneous and muscular edema suggesting cellulitis and myositis.   Original Report  Authenticated By: Gaylyn Rong, M.D.    ROS:  No recent f/c/n/v/wt loss PE:  Blood pressure 138/67, pulse 122, temperature 98.2 F (36.8 C), temperature source Oral, resp. rate 18, height 5\' 6"  (1.676 m), weight 62.1 kg (136 lb 14.5 oz), SpO2 95.00%. wn wd male in nad.  A and O x 4.  Mood and affect normal.  EOMI.  Resp unlabored.  L foot with gangrenous 5th toe.  Dorsal foot and ankle are erythematous and swollen.  Some lymphangiitic streaking up the anterior leg.  No lymphadenopathy.  No palpable DP or PT pulses.  Diminished sens to LT in both feet plantarly and dorsally to the level of the ankle.  5/5 strength in PF and DF of the toes.  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  The left 5th ray is not salvageable and requires amputation.  The MRI suggests the possibility of a dorsal foot abscess / phlegmon as well.  I and D of this area can be performed at the same time.  I'll post him for surgery tomorrow afternoon.  NPO after midnight tonight.  Aspirin and plavix can be continued, but lovenox should stop after today's dose.  He should stay on IV abx.  Smoking cessation and tight blood glucose control will improve the likelihood of success as well.  Improved blood supply to the foot would certainly improve his chances of healing the lateral wound.  I've counseled the patient that he's at high risk of requiring below knee amputation or conceivably even above knee amputation if he can't heal the foot wounds.  Toni Arthurs 08/25/2012, 11:58 AM

## 2012-08-26 ENCOUNTER — Encounter (HOSPITAL_COMMUNITY)
Admission: EM | Disposition: A | Discharge status: Home / Self Care | Payer: Self-pay | Source: Home / Self Care | Attending: Internal Medicine

## 2012-08-26 ENCOUNTER — Inpatient Hospital Stay (HOSPITAL_COMMUNITY): Payer: Medicare Other | Admitting: Anesthesiology

## 2012-08-26 ENCOUNTER — Encounter (HOSPITAL_COMMUNITY): Payer: Self-pay | Admitting: Anesthesiology

## 2012-08-26 DIAGNOSIS — E871 Hypo-osmolality and hyponatremia: Secondary | ICD-10-CM | POA: Insufficient documentation

## 2012-08-26 DIAGNOSIS — R0989 Other specified symptoms and signs involving the circulatory and respiratory systems: Secondary | ICD-10-CM

## 2012-08-26 DIAGNOSIS — M869 Osteomyelitis, unspecified: Secondary | ICD-10-CM

## 2012-08-26 DIAGNOSIS — M79609 Pain in unspecified limb: Secondary | ICD-10-CM

## 2012-08-26 DIAGNOSIS — D696 Thrombocytopenia, unspecified: Secondary | ICD-10-CM | POA: Diagnosis present

## 2012-08-26 HISTORY — PX: AMPUTATION: SHX166

## 2012-08-26 LAB — CBC
HCT: 26 % — ABNORMAL LOW (ref 39.0–52.0)
Hemoglobin: 9 g/dL — ABNORMAL LOW (ref 13.0–17.0)
Hemoglobin: 9.3 g/dL — ABNORMAL LOW (ref 13.0–17.0)
MCH: 31.3 pg (ref 26.0–34.0)
MCHC: 34.6 g/dL (ref 30.0–36.0)
MCHC: 34.1 g/dL (ref 30.0–36.0)
MCV: 91.9 fL (ref 78.0–100.0)
RBC: 2.86 MIL/uL — ABNORMAL LOW (ref 4.22–5.81)
RBC: 2.97 MIL/uL — ABNORMAL LOW (ref 4.22–5.81)

## 2012-08-26 LAB — BASIC METABOLIC PANEL
BUN: 6 mg/dL (ref 6–23)
BUN: 7 mg/dL (ref 6–23)
CO2: 27 mEq/L (ref 19–32)
CO2: 25 mEq/L (ref 19–32)
Calcium: 8.9 mg/dL (ref 8.4–10.5)
Chloride: 96 mEq/L (ref 96–112)
Creatinine, Ser: 0.5 mg/dL (ref 0.50–1.35)
GFR calc Af Amer: >90 mL/min (ref >90)
GFR calc non Af Amer: >90 mL/min (ref >90)
Glucose, Bld: 98 mg/dL (ref 70–99)
Potassium: 3.6 mEq/L (ref 3.5–5.1)
Sodium: 128 mEq/L — ABNORMAL LOW (ref 135–145)

## 2012-08-26 LAB — PROTIME-INR: Prothrombin Time: 14.3 seconds (ref 11.6–15.2)

## 2012-08-26 LAB — PLATELET INHIBITION P2Y12: Platelet Function  P2Y12: 203 [PRU] (ref 194–418)

## 2012-08-26 LAB — SURGICAL PCR SCREEN
MRSA, PCR: NEGATIVE
Staphylococcus aureus: NEGATIVE

## 2012-08-26 SURGERY — AMPUTATION, FOOT, RAY
Anesthesia: General | Site: Foot | Laterality: Left | Wound class: Dirty or Infected

## 2012-08-26 MED ORDER — SODIUM CHLORIDE 0.9 % IV SOLN
INTRAVENOUS | Status: DC
  Administered 2012-08-26: 19:00:00 via INTRAVENOUS

## 2012-08-26 MED ORDER — PROMETHAZINE HCL 25 MG/ML IJ SOLN: 6.2500 mg | INTRAMUSCULAR | Status: DC | PRN

## 2012-08-26 MED ORDER — DIAZEPAM 2 MG PO TABS: 2.0000 mg | ORAL_TABLET | ORAL | Status: DC

## 2012-08-26 MED ORDER — PROPOFOL 10 MG/ML IV BOLUS
INTRAVENOUS | Status: DC | PRN
  Administered 2012-08-26: 110 mg via INTRAVENOUS

## 2012-08-26 MED ORDER — MIDAZOLAM HCL 5 MG/5ML IJ SOLN
INTRAMUSCULAR | Status: DC | PRN
  Administered 2012-08-26: 1 mg via INTRAVENOUS

## 2012-08-26 MED ORDER — SODIUM CHLORIDE 0.9 % IV SOLN: 1.0000 mL/kg/h | INTRAVENOUS | Status: DC

## 2012-08-26 MED ORDER — 0.9 % SODIUM CHLORIDE (POUR BTL) OPTIME
TOPICAL | Status: DC | PRN
  Administered 2012-08-26: 1000 mL

## 2012-08-26 MED ORDER — ASPIRIN 81 MG PO CHEW
324.0000 mg | CHEWABLE_TABLET | ORAL | Status: AC
  Administered 2012-08-27: 324 mg via ORAL
  Filled 2012-08-26: qty 1
  Filled 2012-08-26: qty 3

## 2012-08-26 MED ORDER — BACITRACIN ZINC 500 UNIT/GM EX OINT
TOPICAL_OINTMENT | CUTANEOUS | Status: DC | PRN
  Administered 2012-08-26: 1 via TOPICAL

## 2012-08-26 MED ORDER — ASPIRIN EC 81 MG PO TBEC
81.0000 mg | DELAYED_RELEASE_TABLET | Freq: Every day | ORAL | Status: DC
  Filled 2012-08-26: qty 1

## 2012-08-26 MED ORDER — FENTANYL CITRATE 0.05 MG/ML IJ SOLN: 25.0000 ug | INTRAMUSCULAR | Status: DC | PRN

## 2012-08-26 MED ORDER — FENTANYL CITRATE 0.05 MG/ML IJ SOLN
INTRAMUSCULAR | Status: DC | PRN
  Administered 2012-08-26 (×4): 50 ug via INTRAVENOUS

## 2012-08-26 MED ORDER — OXYCODONE HCL 5 MG PO TABS
5.0000 mg | ORAL_TABLET | ORAL | Status: DC | PRN
  Administered 2012-08-27 – 2012-08-29 (×2): 10 mg via ORAL
  Filled 2012-08-26 (×2): qty 2

## 2012-08-26 MED ORDER — SODIUM CHLORIDE 0.9 % IJ SOLN: 3.0000 mL | INTRAMUSCULAR | Status: DC | PRN

## 2012-08-26 MED ORDER — SODIUM CHLORIDE 0.9 % IJ SOLN: 3.0000 mL | Freq: Two times a day (BID) | INTRAMUSCULAR | Status: DC

## 2012-08-26 MED ORDER — SODIUM CHLORIDE 0.9 % IV SOLN: 250.0000 mL | INTRAVENOUS | Status: DC | PRN

## 2012-08-26 MED ORDER — ONDANSETRON HCL 4 MG/2ML IJ SOLN
INTRAMUSCULAR | Status: DC | PRN
  Administered 2012-08-26: 4 mg via INTRAVENOUS

## 2012-08-26 MED ORDER — LIDOCAINE HCL (CARDIAC) 10 MG/ML IV SOLN
INTRAVENOUS | Status: DC | PRN
  Administered 2012-08-26: 60 mg via INTRAVENOUS

## 2012-08-26 SURGICAL SUPPLY — 40 items
BAG ZIPLOCK 12X15 (MISCELLANEOUS) IMPLANT
BANDAGE CONFORM 3  STR LF (GAUZE/BANDAGES/DRESSINGS) IMPLANT
BANDAGE ELASTIC 4 VELCRO ST LF (GAUZE/BANDAGES/DRESSINGS) ×2 IMPLANT
BANDAGE GAUZE ELAST BULKY 4 IN (GAUZE/BANDAGES/DRESSINGS) ×2 IMPLANT
BLADE OSCILLATING/SAGITTAL (BLADE) ×1
BLADE SURG 15 STRL LF DISP TIS (BLADE) ×1 IMPLANT
BLADE SURG 15 STRL SS (BLADE) ×1
BLADE SW THK.38XMED LNG THN (BLADE) ×1 IMPLANT
BNDG ESMARK 4X9 LF (GAUZE/BANDAGES/DRESSINGS) ×2 IMPLANT
CLOTH BEACON ORANGE TIMEOUT ST (SAFETY) ×2 IMPLANT
CONT SPECI 4OZ STER CLIK (MISCELLANEOUS) ×2 IMPLANT
DRAPE LG THREE QUARTER DISP (DRAPES) ×2 IMPLANT
DRAPE SURG 17X11 SM STRL (DRAPES) IMPLANT
DRAPE U-SHAPE 47X51 STRL (DRAPES) ×2 IMPLANT
DRSG ADAPTIC 3X8 NADH LF (GAUZE/BANDAGES/DRESSINGS) IMPLANT
DRSG PAD ABDOMINAL 8X10 ST (GAUZE/BANDAGES/DRESSINGS) ×4 IMPLANT
ELECT REM PT RETURN 9FT ADLT (ELECTROSURGICAL)
ELECTRODE REM PT RTRN 9FT ADLT (ELECTROSURGICAL) IMPLANT
GLOVE BIO SURGEON STRL SZ8 (GLOVE) ×2 IMPLANT
GLOVE BIOGEL PI IND STRL 7.0 (GLOVE) ×1 IMPLANT
GLOVE BIOGEL PI IND STRL 8 (GLOVE) ×1 IMPLANT
GLOVE BIOGEL PI INDICATOR 7.0 (GLOVE) ×1
GLOVE BIOGEL PI INDICATOR 8 (GLOVE) ×1
GLOVE SURG SS PI 6.5 STRL IVOR (GLOVE) ×2 IMPLANT
GOWN PREVENTION PLUS XLARGE (GOWN DISPOSABLE) ×2 IMPLANT
KIT BASIN OR (CUSTOM PROCEDURE TRAY) ×2 IMPLANT
MANIFOLD NEPTUNE II (INSTRUMENTS) ×2 IMPLANT
NS IRRIG 1000ML POUR BTL (IV SOLUTION) ×2 IMPLANT
PACK LOWER EXTREMITY WL (CUSTOM PROCEDURE TRAY) ×2 IMPLANT
PAD CAST 4YDX4 CTTN HI CHSV (CAST SUPPLIES) IMPLANT
PADDING CAST ABS 3INX4YD NS (CAST SUPPLIES)
PADDING CAST ABS COTTON 3X4 (CAST SUPPLIES) IMPLANT
PADDING CAST COTTON 4X4 STRL (CAST SUPPLIES)
POSITIONER SURGICAL ARM (MISCELLANEOUS) ×4 IMPLANT
SPONGE GAUZE 4X4 12PLY (GAUZE/BANDAGES/DRESSINGS) ×2 IMPLANT
STOCKINETTE 8 INCH (MISCELLANEOUS) ×2 IMPLANT
SUT ETHILON 2 0 PSLX (SUTURE) ×4 IMPLANT
SUT NYLON 3 0 (SUTURE) IMPLANT
SUT NYLON 4 0 (SUTURE) IMPLANT
WATER STERILE IRR 1500ML POUR (IV SOLUTION) IMPLANT

## 2012-08-26 NOTE — Progress Notes (Signed)
Subjective: No chest pain, no SOB.    Objective: Vital signs in last 24 hours: Temp:  [98.4 F (36.9 C)-98.7 F (37.1 C)] 98.7 F (37.1 C) (08/11 0519) Pulse Rate:  [97-110] 105 (08/11 0519) Resp:  [18-20] 18 (08/11 0519) BP: (101-130)/(59-74) 130/72 mmHg (08/11 0519) SpO2:  [93 %-97 %] 96 % (08/11 0519) Weight change:  Last BM Date: 08/25/12 Intake/Output from previous day: -956 08/10 0701 - 08/11 0700 In: 568.3 [I.V.:568.3] Out: 1525 [Urine:1525] Intake/Output this shift:    PE: General:Pleasant affect, NAD Skin:Warm and dry, brisk capillary refill HEENT:normocephalic, sclera clear, mucus membranes moist Neck:supple, no JVD Heart:S1S2 RRR without murmur, gallup, rub or click Lungs:diminished breath sounds, with rhonchi, no rales or wheezes NWG:NFAO, non tender, + BS, do not palpate liver spleen or masses Ext: Lt leg with erythema and 1-2+ swelling from mid calf through foot.  5th digit gangrenous. Dressing in place. Rt. Foot/leg no edema  Neuro:alert and oriented, MAE, follows commands, + facial symmetry   EKG: S Tach without acute changes. Lab Results:  Recent Labs  08/25/12 0545 08/26/12 0458  WBC 15.4* 9.6  HGB 8.7* 9.0*  HCT 26.0* 26.0*  PLT 80* 79*   BMET  Recent Labs  08/25/12 0545 08/26/12 0458  NA 123* 128*  K 3.5 3.6  CL 90* 92*  CO2 26 27  GLUCOSE 113* 98  BUN 7 6  CREATININE 0.51 0.53  CALCIUM 8.6 8.6   No results found for this basename: TROPONINI, CK, MB,  in the last 72 hours  No results found for this basename: CHOL,  HDL,  LDLCALC,  LDLDIRECT,  TRIG,  CHOLHDL   No results found for this basename: HGBA1C     Lab Results  Component Value Date   TSH 0.466 08/16/2012      Studies/Results: Mr Foot Left Wo Contrast  08/24/2012   *RADIOLOGY REPORT*  Clinical Data: Left foot erythema and swelling.  Open wound along the distal plantar surface of the foot, query osteomyelitis.  MRI OF THE LEFT FOREFOOT WITHOUT CONTRAST  Technique:   Multiplanar, multisequence MR imaging was performed. No intravenous contrast was administered.  Comparison: 08/21/2012 radiographs  Findings: Reduced T1 and increased T2 signal in the head of the fifth metatarsal noted.  There is abnormal diffuse subcutaneous and muscular edema in the distal foot.  Confluent phlegmon noted dorsal to the fifth metatarsal head, potentially with very early abscess formation in this vicinity on image 20 of series 7- correlate with any fluctuance dorsal to the left fifth metatarsal head.  Lisfranc ligament intact.  No malalignment at the Lisfranc joint.  IMPRESSION:  1.  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 Dorsal phlegmon and possibly early dorsal abscess formation, correlate with any fluctuance overlying the head of the fifth metatarsal. 2.  Diffuse subcutaneous and muscular edema suggesting cellulitis and myositis.   Original Report Authenticated By: Gaylyn Rong, M.D.    Medications: I have reviewed the patient's current medications. Scheduled Meds: . aspirin EC  81 mg Oral Daily  . atorvastatin  20 mg Oral q1800  . clopidogrel  75 mg Oral Daily  . feeding supplement  237 mL Oral BID BM  . gabapentin  600 mg Oral Daily  . lisinopril  20 mg Oral Daily  . multivitamin with minerals  1 tablet Oral Daily  . nortriptyline  75 mg Oral QHS  . piperacillin-tazobactam (ZOSYN)  IV  3.375 g Intravenous Q8H  . tamsulosin  0.4 mg Oral Daily  . vancomycin  1,250 mg Intravenous Q12H   Continuous Infusions: . sodium chloride 100 mL/hr at 08/26/12 0450   PRN Meds:.acetaminophen, budesonide-formoterol, HYDROmorphone (DILAUDID) injection, LORazepam, ondansetron (ZOFRAN) IV, ondansetron, oxyCODONE-acetaminophen, prochlorperazine, traMADol  Assessment/Plan: Active Problems:   Tobacco abuse   HYPERTENSION   COPD   PVD, s/p Rt SFA PTA/ stenting 10/13/11   Lung cancer   Hyponatremia   Thrombocytopenia  PLAN:discussed with Dr. Allyson Sabal, he would prefer  for not to proceed to OR until after PV angio.  But first he needs lower ext arterial dopplers-today, then proceed with PV angio tomorrow.  He will discuss with Dr. Victorino Dike.   Ortho plans for 5th ray amputation and I&D for dorsal phlegmon and possibly early dorsal abscess formation.  Vascular tech is currently at [facility] and dopplers to be done this AM.  Hyponatremia improving.   Platelets continue to decrease.  WBCs have decreased with antibiotics.  Continues with anemia.   LOS: 5 days   Time spent with pt. :15 minutes. [facility] R  Nurse Practitioner Certified Pager [phone] 08/26/2012, 8:48 AM   Agree with note written by Nada Boozer RNP  For left 5th toe amputation and ray resection this PM. Discussed with Dr. Victorino Dike this AM. Will plan on PV angio tomorrow AM at [facility] . Arterial dopplers show occlusion of Left SFA stent with reconstitution. Healing of the amputation dependent on adequate perfusion. I am concerned about his progressive thrombocytopenia.   Runell Gess 08/26/2012 4:21 PM

## 2012-08-26 NOTE — Transfer of Care (Signed)
Immediate Anesthesia Transfer of Care Note  Patient: [name]  Procedure(s) Performed: Procedure(s): IRRIGATION AND DEBRIDMENT LEFT DORSAL ABSCESS/ 4TH AND 5TH LEFT RAY AMPUTATION  (Left)  Patient Location: PACU  Anesthesia Type:General  Level of Consciousness: awake, alert , oriented and patient cooperative  Airway & Oxygen Therapy: Patient Spontanous Breathing and Patient connected to face mask oxygen  Post-op Assessment: Report given to PACU RN, Post -op Vital signs reviewed and stable and Patient moving all extremities X 4  Post vital signs: stable  Complications: No apparent anesthesia complications

## 2012-08-26 NOTE — Progress Notes (Signed)
[facility]  Date of Admission:  08/21/2012  Antibiotics: Vancomycin Zosyn  Subjective: No complaints  Objective: Temp:  [98.4 F (36.9 C)-98.7 F (37.1 C)] 98.4 F (36.9 C) (08/11 1340) Pulse Rate:  [97-110] 99 (08/11 1340) Resp:  [18-20] 18 (08/11 1340) BP: (101-137)/(59-74) 137/68 mmHg (08/11 1340) SpO2:  [93 %-97 %] 93 % (08/11 1340)  General: Awake, alert, nad Skin: no rashes Lungs: CTA B Cor: RRR Abdomen: soft, nt, nd Ext: left necrotic toe  Lab Results Lab Results  Component Value Date   WBC 9.6 08/26/2012   HGB 9.0* 08/26/2012   HCT 26.0* 08/26/2012   MCV 90.9 08/26/2012   PLT 79* 08/26/2012    Lab Results  Component Value Date   CREATININE 0.53 08/26/2012   BUN 6 08/26/2012   NA 128* 08/26/2012   K 3.6 08/26/2012   CL 92* 08/26/2012   CO2 27 08/26/2012    Lab Results  Component Value Date   ALT 8 08/21/2012   AST 9 08/21/2012   ALKPHOS 128* 08/21/2012   BILITOT 0.3 08/21/2012      Microbiology: Recent Results (from the past 240 hour(s))  CULTURE, BLOOD (ROUTINE X 2)     Status: None   Collection Time    08/21/12 11:15 PM      Result Value Range Status   Specimen Description BLOOD RIGHT HAND   Final   Special Requests BOTTLES DRAWN AEROBIC AND ANAEROBIC   Final   Culture  Setup Time     Final   Value: 08/22/2012 04:30     Performed at [facility]   Culture     Final   Value:        BLOOD CULTURE RECEIVED NO GROWTH TO DATE CULTURE WILL BE HELD FOR 5 DAYS BEFORE ISSUING A FINAL NEGATIVE REPORT     Performed at [facility]   Report Status PENDING   Incomplete  CULTURE, BLOOD (ROUTINE X 2)     Status: None   Collection Time    08/21/12 11:50 PM      Result Value Range Status   Specimen Description BLOOD LEFT HAND   Final   Special Requests BOTTLES DRAWN AEROBIC AND ANAEROBIC   Final   Culture  Setup Time     Final   Value: 08/22/2012 04:30     Performed at [facility]   Culture     Final   Value:        BLOOD CULTURE RECEIVED NO GROWTH TO DATE CULTURE WILL BE HELD FOR 5 DAYS BEFORE ISSUING A FINAL NEGATIVE REPORT     Performed at [facility]   Report Status PENDING   Incomplete  SURGICAL PCR SCREEN     Status: None   Collection Time    08/26/12 10:18 AM      Result Value Range Status   MRSA, PCR NEGATIVE  NEGATIVE Final   Staphylococcus aureus NEGATIVE  NEGATIVE Final   Comment:            The Xpert SA Assay (FDA     approved for NASAL specimens     in patients over 52 years of age),     is one component of     a comprehensive surveillance     program.  Test performance has     been validated by [employer] for patients greater     than or equal  to 73 year old.     It is not intended     to diagnose infection nor to     guide or monitor treatment.    Studies/Results: No results found.  Assessment/Plan: 1) necrotic toe - vascular and infectious.  MRI noted with abscess, osteomyelitis. To get amputation of at least toe.  Await surgical management and decide on antibiotics (if infection completely removed) and duration.    Frank Righter, MD [facility] [email or web address] [phone] pager   [phone] cell 08/26/2012, 2:04 PM

## 2012-08-26 NOTE — Brief Op Note (Addendum)
08/21/2012 - 08/26/2012  6:03 PM  PATIENT:  [name]  62 y.o. male  PRE-OPERATIVE DIAGNOSIS:  left foot gangrene and dorsal foot abscess  POST-OPERATIVE DIAGNOSIS:  same  Procedure(s): 1.  Left 4th ray amputation 2.  Left 5th ray amputation 3.  Irrigation and excisional debridement of left dorsal foot abscess  SURGEON:  Toni Arthurs, MD  ASSISTANT: n/a  ANESTHESIA:   General  EBL:  minimal   TOURNIQUET:  approx 15 min with ankle esmarch  COMPLICATIONS:  None apparent  DISPOSITION:  Extubated, awake and stable to recovery.  DICTATION ID:  [id number]

## 2012-08-26 NOTE — Anesthesia Preprocedure Evaluation (Addendum)
Anesthesia Evaluation  Patient identified by MRN, date of birth, ID band Patient awake    Reviewed: Allergy & Precautions, H&P , NPO status , Patient's Chart, lab work & pertinent test results  Airway Mallampati: II TM Distance: >3 FB Neck ROM: Full    Dental  (+) Missing, Poor Dentition, Chipped and Upper Dentures   Pulmonary COPDCurrent Smoker,  breath sounds clear to auscultation  Pulmonary exam normal       Cardiovascular METS: hypertension, Pt. on medications + Peripheral Vascular Disease Rhythm:Regular Rate:Tachycardia  S/P angioplasty with stent to Lt. SFA 06/30/11 06/30/2011    Neuro/Psych CVA negative psych ROS   GI/Hepatic negative GI ROS, Neg liver ROS,   Endo/Other  diabetes  Renal/GU negative Renal ROS  negative genitourinary   Musculoskeletal negative musculoskeletal ROS (+)   Abdominal   Peds negative pediatric ROS (+)  Hematology  (+) Blood dyscrasia, anemia ,   Anesthesia Other Findings   Reproductive/Obstetrics negative OB ROS                          Anesthesia Physical Anesthesia Plan  ASA: III  Anesthesia Plan: General   Post-op Pain Management:    Induction: Intravenous  Airway Management Planned: LMA  Additional Equipment:   Intra-op Plan:   Post-operative Plan:   Informed Consent: I have reviewed the patients History and Physical, chart, labs and discussed the procedure including the risks, benefits and alternatives for the proposed anesthesia with the patient or authorized representative who has indicated his/her understanding and acceptance.   Dental advisory given  Plan Discussed with: CRNA and Surgeon  Anesthesia Plan Comments:         Anesthesia Quick Evaluation

## 2012-08-26 NOTE — Progress Notes (Addendum)
*  PRELIMINARY RESULTS* Vascular Ultrasound VASCULAR LAB PRELIMINARY  ARTERIAL  ABI and Lower Extremity Arterial Duplex has been completed.       RIGHT    LEFT    PRESSURE WAVEFORM  PRESSURE WAVEFORM  BRACHIAL 147 Triphasic BRACHIAL 139 Triphasic  DP 92 Monophasic DP 75 Monophasic  AT   AT    PT 83 Monophasic PT 78 Monophasic  PER   PER    GREAT TOE  NA GREAT TOE  NA    RIGHT LEFT  ABI 0.63 0.53   The right ABI is suggestive of moderate arterial insufficiency. The left ABI is suggestive of moderate, borderline severe, arterial insufficiency.  Lower extremity duplex findings: >50% stenosis of the right femoral artery >75% stenosis of the left femoral artery with occlusion of the mid to distal segments Collateral flow at the distal left thigh with recanalization of the popliteal artery >75% stenosis of the distal left popliteal artery  Dorsalis pedis arteries are patent bilaterally with monophasic flow.   08/26/2012 11:16 AM Gertie Fey, RVT, RDCS, RDMS

## 2012-08-26 NOTE — Interval H&P Note (Signed)
History and Physical Interval Note:  08/26/2012 4:53 PM  [name]  has presented today for surgery, with the diagnosis of left foot gangrene, left fifth ray amputation  The various methods of treatment have been discussed with the patient and family. After consideration of risks, benefits and other options for treatment, the patient has consented to left 5th ray amputation as a surgical intervention .  The patient's history has been reviewed, patient examined, no change in status, stable for surgery.  I have reviewed the patient's chart and labs.  Questions were answered to the patient's satisfaction.    The risks and benefits of the alternative treatment options have been discussed in detail.  The patient wishes to proceed with surgery and specifically understands risks of bleeding, infection, nerve damage, blood clots, need for additional surgery, amputation and death.    Toni Arthurs

## 2012-08-26 NOTE — Progress Notes (Addendum)
Patient ID: [name], male   DOB: 09-Jan-1951, 62 y.o.   MRN: [id number]  [facility] PROGRESS NOTE  [name] WUJ:[id number] DOB: 22-May-1950 DOA: 08/21/2012 PCP: [name], MD  Brief narrative:  62 y.o. year old male with COPD, lung cancer, chemotherapy induced neutropenia, peripheral artery disease, CVA, presenting with cellulitis of the left foot. Patient had recent admission for left foot cellulitis and was discharged home on doxycycline. He explains that his swelling and pain has not gotten better and erythema has progressed to involve the entire foot and extending slightly above the ankle. He also reported subjective fevers and chills.   Patient noted he is currently receiving chemotherapy for his lung cancer. Has had to hold on treatment for the past 3 weeks secondary to low cell counts and infection. No known trauma. In the ER , patient was noted white count 12.7. Left foot x-ray negative for any osteomyelitic changes.   Assessment and Plan:  Principal Problem:  Left foot cellulitis, osteomyelitis with left fifth toe ischemia  - erythema and left toe ischemia appear to look worse this AM  - per ortho, plan to take pt to OR this afternoon (need toe amputation, I&D) - X ray of the left foot shows no evidence of osteomyelitis but early osteo evident on MRI with possibility of abscess  - continue vanco and zosyn as recommended by ID day #6 - appreciate infectious disease, cardiology input, ortho input  Active Problems:  Hyponatremia  - sodium slowly trending up 124 --> 128 --> 126 --> 123 --> 128 - keep NPO for now as surgery is planned for this afternoon  - IVF given pre op Hypokalemia and hypomagnesemia  - now both WNL  - BMP in AM  Leukocytosis  - Secondary to left foot cellulitis, osteo, and ischemic left toe  - WBC trending down and is within normal limits this AM - antibiotics as above  Acute blood loss anemia  - Possibly chemotherapy related,   - status post 2 units of PRBC with appropriate increase in Hg/Hct  - Hg remains stable but slight drop overnight 10.3 --> 9.7 --> 8.7 --> 9.0  Thrombocytopenia  - likely sequela of chemotherapy  - will have to monitor closely as pt in on Lovenox  - CBC in AM  COPD, emphysema  - Clinically stable at this time, oxygen saturations at target range   Consultants:  Cardiology  Infectious disease  Oncology  Ortho Procedures/Studies:  Mr Foot Left Wo Contrast 08/24/2012  1. 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 Dorsal phlegmon and possibly early dorsal abscess formation, correlate with any fluctuance overlying the head of the fifth metatarsal.  2. Diffuse subcutaneous and muscular edema suggesting cellulitis and myositis.  Dg Chest 1 View 08/21/2012  Stable COPD/emphysema. Spiculated bilateral pulmonary nodules are better appreciated by CT comparison. No superimposed acute process  Dg Foot Complete Left  08/21/2012 Negative exam.  Antibiotics:  Vancomycin 08/06 -->  Zosyn 08/06 -->  Code Status: Full  Family Communication: Pt and wife at bedside  Disposition Plan: Home when medically stable    HPI/Subjective: No events overnight.   Objective: Filed Vitals:   08/25/12 0600 08/25/12 1525 08/25/12 2138 08/26/12 0519  BP: 138/67 130/74 101/59 130/72  Pulse: 122 97 110 105  Temp: 98.2 F (36.8 C) 98.5 F (36.9 C) 98.4 F (36.9 C) 98.7 F (37.1 C)  TempSrc: Oral Oral Oral Oral  Resp: 18 18 20 18   Height:  Weight:      SpO2: 95% 97% 93% 96%    Intake/Output Summary (Last 24 hours) at 08/26/12 0615 Last data filed at 08/26/12 1610  Gross per 24 hour  Intake 568.33 ml  Output   1525 ml  Net -956.67 ml    Exam:   General:  Pt is alert, follows commands appropriately, not in acute distress  Cardiovascular: Regular rate and rhythm, S1/S2, no murmurs, no rubs, no gallops  Respiratory: Clear to auscultation bilaterally, no wheezing, no crackles, no  rhonchi  Abdomen: Soft, non tender, non distended, bowel sounds present, no guarding  Extremities: left foot edema and erythema up to ankle with ischemic left toe, small 1 cm round ulcer noted on the dorsum of the foot, no drain noted  Neuro: Grossly nonfocal  Data Reviewed: Basic Metabolic Panel:  Recent Labs Lab 08/21/12 2300 08/22/12 08/22/12 1130 08/23/12 0510 08/24/12 0520 08/25/12 0545 08/26/12 0458  NA 124*  --   --  128* 126* 123* 128*  K 3.0*  --   --  3.5 3.6 3.5 3.6  CL 89*  --   --  94* 91* 90* 92*  CO2 24  --   --  25 26 26 27   GLUCOSE 133*  --   --  104* 99 113* 98  BUN 12  --   --  5* 6 7 6   CREATININE 0.69  --   --  0.53 0.58 0.51 0.53  CALCIUM 8.8  --   --  8.6 8.7 8.6 8.6  MG  --  0.9* 1.7  --   --   --   --    Liver Function Tests:  Recent Labs Lab 08/21/12 2300  AST 9  ALT 8  ALKPHOS 128*  BILITOT 0.3  PROT 6.8  ALBUMIN 2.5*   CBC:  Recent Labs Lab 08/21/12 2300 08/23/12 0510 08/24/12 0520 08/25/12 0545 08/26/12 0458  WBC 12.7* 14.3* 20.5* 15.4* 9.6  NEUTROABS 9.5*  --   --   --   --   HGB 7.9* 10.3* 9.7* 8.7* 9.0*  HCT 22.8* 30.3* 28.1* 26.0* 26.0*  MCV 90.8 90.4 90.4 90.3 90.9  PLT 128* 97* 101* 80* 79*   Recent Results (from the past 240 hour(s))  CULTURE, BLOOD (ROUTINE X 2)     Status: None   Collection Time    08/21/12 11:15 PM      Result Value Range Status   Specimen Description BLOOD RIGHT HAND   Final   Special Requests BOTTLES DRAWN AEROBIC AND ANAEROBIC   Final   Culture  Setup Time     Final   Value: 08/22/2012 04:30     Performed at [facility]   Culture     Final   Value:        BLOOD CULTURE RECEIVED NO GROWTH TO DATE CULTURE WILL BE HELD FOR 5 DAYS BEFORE ISSUING A FINAL NEGATIVE REPORT     Performed at [facility]   Report Status PENDING   Incomplete  CULTURE, BLOOD (ROUTINE X 2)     Status: None   Collection Time    08/21/12 11:50 PM      Result Value Range Status   Specimen  Description BLOOD LEFT HAND   Final   Special Requests BOTTLES DRAWN AEROBIC AND ANAEROBIC   Final   Culture  Setup Time     Final   Value: 08/22/2012 04:30     Performed at [facility]   Culture     Final   Value:        BLOOD CULTURE RECEIVED NO GROWTH TO DATE CULTURE WILL BE HELD FOR 5 DAYS BEFORE ISSUING A FINAL NEGATIVE REPORT     Performed at [facility]   Report Status PENDING   Incomplete     Scheduled Meds: . aspirin EC  81 mg Oral Daily  . atorvastatin  20 mg Oral q1800  . clopidogrel  75 mg Oral Daily  . feeding supplement  237 mL Oral BID BM  . gabapentin  600 mg Oral Daily  . lisinopril  20 mg Oral Daily  . multivitamin with minerals  1 tablet Oral Daily  . nortriptyline  75 mg Oral QHS  . piperacillin-tazobactam (ZOSYN)  IV  3.375 g Intravenous Q8H  . tamsulosin  0.4 mg Oral Daily  . vancomycin  1,250 mg Intravenous Q12H   Continuous Infusions: . sodium chloride 100 mL/hr at 08/26/12 0450     Debbora Presto, MD  [facility] Pager [phone]  If 7PM-7AM, please contact night-coverage [email or web address] Password [email or web address] 08/26/2012, 6:15 AM   LOS: 5 days

## 2012-08-27 ENCOUNTER — Other Ambulatory Visit: Payer: Self-pay

## 2012-08-27 ENCOUNTER — Encounter (HOSPITAL_COMMUNITY)
Admission: EM | Disposition: A | Discharge status: Home / Self Care | Payer: Self-pay | Source: Home / Self Care | Attending: Internal Medicine

## 2012-08-27 ENCOUNTER — Encounter (HOSPITAL_COMMUNITY): Payer: Self-pay | Admitting: Orthopedic Surgery

## 2012-08-27 DIAGNOSIS — I739 Peripheral vascular disease, unspecified: Secondary | ICD-10-CM

## 2012-08-27 DIAGNOSIS — L98499 Non-pressure chronic ulcer of skin of other sites with unspecified severity: Secondary | ICD-10-CM

## 2012-08-27 HISTORY — PX: LOWER EXTREMITY ANGIOGRAM: SHX5508

## 2012-08-27 HISTORY — PX: ABDOMINAL ANGIOGRAM: SHX5499

## 2012-08-27 LAB — CBC
Hemoglobin: 9 g/dL — ABNORMAL LOW (ref 13.0–17.0)
MCH: 31 pg (ref 26.0–34.0)
MCV: 91.7 fL (ref 78.0–100.0)
Platelets: 86 10*3/uL — ABNORMAL LOW (ref 150–400)
RBC: 2.9 MIL/uL — ABNORMAL LOW (ref 4.22–5.81)

## 2012-08-27 LAB — POCT ACTIVATED CLOTTING TIME: Activated Clotting Time: 217 seconds

## 2012-08-27 LAB — BASIC METABOLIC PANEL
CO2: 26 mEq/L (ref 19–32)
Calcium: 8.5 mg/dL (ref 8.4–10.5)
Creatinine, Ser: 0.5 mg/dL (ref 0.50–1.35)
Glucose, Bld: 103 mg/dL — ABNORMAL HIGH (ref 70–99)

## 2012-08-27 SURGERY — ANGIOGRAM, LOWER EXTREMITY
Anesthesia: LOCAL | Laterality: Left

## 2012-08-27 MED ORDER — ACETAMINOPHEN 325 MG PO TABS: 650.0000 mg | ORAL_TABLET | ORAL | Status: DC | PRN

## 2012-08-27 MED ORDER — SODIUM CHLORIDE 0.9 % IV SOLN: INTRAVENOUS | Status: AC

## 2012-08-27 MED ORDER — ONDANSETRON HCL 4 MG/2ML IJ SOLN: 4.0000 mg | Freq: Four times a day (QID) | INTRAMUSCULAR | Status: DC | PRN

## 2012-08-27 MED ORDER — ASPIRIN EC 325 MG PO TBEC
325.0000 mg | DELAYED_RELEASE_TABLET | Freq: Every day | ORAL | Status: DC
  Administered 2012-08-29: 325 mg via ORAL
  Filled 2012-08-27: qty 1

## 2012-08-27 MED ORDER — POTASSIUM CHLORIDE CRYS ER 20 MEQ PO TBCR
40.0000 meq | EXTENDED_RELEASE_TABLET | Freq: Once | ORAL | Status: AC
  Administered 2012-08-27: 40 meq via ORAL
  Filled 2012-08-27: qty 2

## 2012-08-27 MED ORDER — CLOPIDOGREL BISULFATE 75 MG PO TABS
75.0000 mg | ORAL_TABLET | Freq: Every day | ORAL | Status: DC
  Administered 2012-08-28: 75 mg via ORAL

## 2012-08-27 NOTE — CV Procedure (Signed)
[name] is a 62 y.o. male    [id number] LOCATION:  FACILITY: [facility]  PHYSICIAN: Nanetta Batty, M.D. 01-20-1950   DATE OF PROCEDURE:  08/27/2012  DATE OF DISCHARGE:   CARDIAC CATHETERIZATION     History obtained from chart review.[name] is a 62 year old Caucasian male with a history of PAD status post right SFA Drexel atherectomy 2012 followed by left SFA stenting for claudication with 3 overlapping Nitinol self-expanding stents in 2013. He has lung cancer and is being treated with chemotherapy. He is neutropenic and thrombocytopenic. He was admitted with left fifth toe gangrene with overlying cellulitis. MRI suggested osteomyelitis. Arterial Doppler suggested occlusion of his left SFA. He underwent left fifth toe amputation with Ray resection by Dr. Toni Arthurs yesterday. The patient presents today for angiography and potential percutaneous and auscultation to establish in-line flow to facilitate wound healing.   PROCEDURE DESCRIPTION:    The patient was brought to the second floor [facility] cath lab in the postabsorptive state. He was premedicated with Valium 5 mg by mouth, IV Versed and fentanyl. His right groinwas prepped and shaved in usual sterile fashion. Xylocaine 1% was used for local anesthesia. A 5 French sheath was inserted into the right common femoral artery using standard Seldinger technique.a 5 [place] crossover catheter at and ankle all catheters were used to obtain contralateral access after which left lower gemmae angiography was performed using digital subtraction bolus chase a step table technique. This appeared that was used for the entirety of the case. Retrograde aortic pressure was monitored during the case.   HEMODYNAMICS:    AO SYSTOLIC/AO DIASTOLIC: 141/65     ANGIOGRAPHIC RESULTS:   1. Left lower extremity angiography: The left SFA was occluded from just beyond the origin of the entirety of the stented segment. It reconstitutes in the  above-the-knee popliteal bypass on the femoris collateral. There is 1 vessel runoff via the peroneal which then collateralized the occluded anterior tibial and posterior tibial arteries.   IMPRESSION:occluded left SFA stented segment of her long distance. We will attempt to cross and perform PTA and Viabahn stent grafting to facilitate wound healing.  Procedure description: a 7 French/55 cm Ansel Sheath was then advanced over the bifurcation. The patient received 5000 units of heparin with an ACT of 217. Total contrast used during the case was 114 cc to the patient. I attempted to cross the occlusion with a straight followed by an angled 035 Navicross  endhole catheter and a stiff angled Glidewire unsuccessfully. The procedure was ultimately aborted, and the sheath was withdrawn withdrawn across the bifurcation and exchanged over a 035 Versicore wire for a short 7 [place] sheath. The patient left the Cath Lab in stable condition. We will get Dr. Durene Cal  to consult possibility of performing a left femoropopliteal bypass graft.  Runell Gess MD, [facility] 08/27/2012 11:25 AM

## 2012-08-27 NOTE — Consult Note (Signed)
Full note to follow Vein mapping ordered  Possible bypass Thursday/friday  Durene Cal

## 2012-08-27 NOTE — Op Note (Signed)
NAMEMAISON, KESTENBAUM NO.:  [id number]  MEDICAL RECORD NO.:  [id number]  LOCATION:  1516                         FACILITY:  [facility]  PHYSICIAN:  Toni Arthurs, MD        DATE OF BIRTH:  1950/09/14  DATE OF PROCEDURE:  08/26/2012 DATE OF DISCHARGE:                              OPERATIVE REPORT   PREOPERATIVE DIAGNOSES:  Left foot gangrene and dorsal subcutaneous abscess.  POSTOPERATIVE DIAGNOSES:  Left foot gangrene and dorsal subcutaneous abscess.  PROCEDURE: 1. Left fifth ray amputation. 2. Left fourth ray amputation. 3. Irrigation and excisional debridement of the dorsal foot abscess.  SURGEON:  Toni Arthurs, MD.  ANESTHESIA:  General.  ESTIMATED BLOOD LOSS:  Minimal.  TOURNIQUET TIME:  Approximately 15 minutes with an ankle Esmarch.  COMPLICATIONS:  None apparent.  SPECIMENS:  Deep tissue to Microbiology for aerobic and anaerobic culture.  DISPOSITION:  Extubated, awake, and stable to recovery.  He may require revision surgery depending on the healing noted at the site of the wound versus any progression of infection.  INDICATIONS FOR PROCEDURE:  The patient is a 62 year old male with a past medical history significant for peripheral neuropathy and peripheral vascular disease.  He developed an ulcer on the left plantar forefoot under the fifth metatarsal head several weeks ago.  This has worsened and then became infected late last week.  He was admitted.  He has been on IV antibiotics since then.  He has a history of stenting of his superficial femoral artery by Dr. Allyson Sabal about a year ago.  He smokes about half a pack cigarettes a day and has for many years.  He is currently undergoing chemotherapy for lung cancer.  He presents now for operative treatment of this gangrenous left foot.  He understands the risks and benefits, the alternative treatment options and elects surgical treatment.  He specifically understands risks of bleeding, infection,  nerve damage, blood clots, need for additional surgery, revision, amputation, and death.  PROCEDURE IN DETAIL:  After preoperative consent was obtained and the correct operative site was identified, the patient was brought to the operating room and placed supine on the operating table.  General anesthesia was induced.  Preoperative antibiotics were therapeutic at the time of surgery.  A surgical time-out was taken.  The left lower extremity was prepped and draped in standard sterile fashion.  Foot was exsanguinated and the tourniquet was wrapped around the ankle.  A longitudinal incision was marked on the lateral border of the foot, so as to excise all of the necrotic tissue at the base of the fifth toe. The incision was made and sharp dissection was carried down to the level of the metatarsal shaft developing full-thickness flaps.  Sharp dissection was carried subperiosteally around the fifth metatarsal shaft.  This metatarsal was then transected with an oscillating saw doubling the blade medially and plantarly.  The soft tissue around fourth metatarsal shaft dorsally and plantarly was noted to be extensively involved with infection and was grossly necrotic.  All of this was sharply excised.  This left a skeletonized fourth metatarsal. Decision was made to proceed with fourth ray resection at that time. The oscillating saw  was again used to cut the fourth metatarsal shaft at its metaphyseal junction.  The cut was beveled plantarly.  Fourth toe was then resected along with the fourth metatarsal.  At this point, all the remaining necrotic tissue was sharply excised with a scalpel. Tourniquet was released.  Hemostasis was achieved.  The wound was copiously irrigated.  Retention sutures were placed in the skin approximating the wound edges loosely.  The dorsal abscess was identified and incised.  Sharp dissection was carried down to the level of the subcutaneous tissue and then taken down  further deep to the fascia.  The necrotic tissue was all excised sharply.  The wound was irrigated copiously.  Sterile dressings were applied to both of the wounds.  Deep tissue had been obtained from the lateral forefoot and sent as a specimen to Microbiology for aerobic and anaerobic culture. Sterile dressings were applied followed by compression wrap.  The patient was awakened from anesthesia and transported to recovery room in stable condition.  FOLLOWUP PLAN:  The patient will be seen by Dr. Allyson Sabal tomorrow for possible angiography.  He is to be transferred to [facility] for this procedure and I will continue to follow him there.     Toni Arthurs, MD     JH/MEDQ  D:  08/26/2012  T:  08/27/2012  Job:  [id number]

## 2012-08-27 NOTE — Progress Notes (Signed)
Patient ID: [name], male   DOB: Mar 24, 1950, 62 y.o.   MRN: [id number] [facility] PROGRESS NOTE  [name] ION:[id number] DOB: [birth year] DOA: 08/21/2012 PCP: [name], MD  Brief narrative:  62 y.o. year old male with COPD, lung cancer, chemotherapy induced neutropenia, peripheral artery disease, CVA, presenting with cellulitis of the left foot. Patient had recent admission for left foot cellulitis and was discharged home on doxycycline. He explains that his swelling and pain has not gotten better and erythema has progressed to involve the entire foot and extending slightly above the ankle. He also reported subjective fevers and chills.  Patient noted he is currently receiving chemotherapy for his lung cancer. Has had to hold on treatment for the past 3 weeks secondary to low cell counts and infection. No known trauma. In the ER , patient was noted white count 12.7. Left foot x-ray negative for any osteomyelitic changes.   Assessment and Plan:  Principal Problem:  Left foot cellulitis, osteomyelitis with left fifth toe ischemia  - erythema and left toe ischemia appear to look worse this AM  - status post Left 4th ray amputation, Left 5th ray amputation, Irrigation and excisional debridement of left dorsal foot abscess, post op day #1, doing well and clinically stable  - continue vanco and zosyn as recommended by ID day #7 - appreciate infectious disease, cardiology input, ortho input  - plan for arterial doppler today  Active Problems:  Hyponatremia  - sodium slowly trending up 124 --> 128 --> 126 --> 123 --> 128 --> 130 - IVF given pre op  Hypokalemia and hypomagnesemia  - will given one dose of K-dur 40 MEQ PO  - BMP in AM  Leukocytosis  - Secondary to left foot cellulitis, osteo, and ischemic left toe  - WBC trending down and is within normal limits this AM  - antibiotics as above  Acute blood loss anemia  - Possibly chemotherapy related,  - status  post 2 units of PRBC with appropriate increase in Hg/Hct  - Hg remains stable but slight drop overnight 10.3 --> 9.7 --> 8.7 --> 9.0  Thrombocytopenia  - likely sequela of chemotherapy  - will have to monitor closely as pt in on Lovenox  - CBC in AM  COPD, emphysema  - Clinically stable at this time, oxygen saturations at target range   Consultants:  Cardiology  Infectious disease  Oncology  Ortho Procedures/Studies:  08/26/2012 Dr. Victorino Dike  1. Left 4th ray amputation  2. Left 5th ray amputation  3. Irrigation and excisional debridement of left dorsal foot abscess Mr Foot Left Wo Contrast 08/24/2012  1. 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 Dorsal phlegmon and possibly early dorsal abscess formation, correlate with any fluctuance overlying the head of the fifth metatarsal.  2. Diffuse subcutaneous and muscular edema suggesting cellulitis and myositis.  Dg Chest 1 View 08/21/2012  Stable COPD/emphysema. Spiculated bilateral pulmonary nodules are better appreciated by CT comparison. No superimposed acute process  Dg Foot Complete Left  08/21/2012 Negative exam.  Antibiotics:  Vancomycin 08/06 -->  Zosyn 08/06 -->  Code Status: Full  Family Communication: Pt and wife at bedside  Disposition Plan: Home when medically stable    HPI/Subjective: No events overnight.   Objective: Filed Vitals:   08/26/12 2102 08/26/12 2159 08/27/12 0156 08/27/12 0531  BP: 130/63 118/58 123/60 138/70  Pulse: 98 97 99 97  Temp: 97.7 F (36.5 C) 97.6 F (36.4 C) 97.8 F (36.6 C) 98.9 F (  37.2 C)  TempSrc: Oral Oral Oral Oral  Resp: 18 20 18 18   Height:      Weight:      SpO2: 99% 100% 98% 99%    Intake/Output Summary (Last 24 hours) at 08/27/12 0641 Last data filed at 08/27/12 1610  Gross per 24 hour  Intake    700 ml  Output    550 ml  Net    150 ml    Exam:   General:  Pt is alert, follows commands appropriately, not in acute distress  Cardiovascular: Regular rate and  rhythm, S1/S2, no murmurs, no rubs, no gallops  Respiratory: Clear to auscultation bilaterally, no wheezing, no crackles, no rhonchi  Abdomen: Soft, non tender, non distended, bowel sounds present, no guarding  Neuro: Grossly nonfocal  Data Reviewed: Basic Metabolic Panel:  Recent Labs Lab 08/22/12 08/22/12 1130  08/24/12 0520 08/25/12 0545 08/26/12 0458 08/26/12 2040 08/27/12 0535  NA  --   --   < > 126* 123* 128* 129* 130*  K  --   --   < > 3.6 3.5 3.6 2.9* 3.3*  CL  --   --   < > 91* 90* 92* 96 95*  CO2  --   --   < > 26 26 27 25 26   GLUCOSE  --   --   < > 99 113* 98 86 103*  BUN  --   --   < > 6 7 6 7 7   CREATININE  --   --   < > 0.58 0.51 0.53 0.50 0.50  CALCIUM  --   --   < > 8.7 8.6 8.6 8.9 8.5  MG 0.9* 1.7  --   --   --   --   --   --   < > = values in this interval not displayed. Liver Function Tests:  Recent Labs Lab 08/21/12 2300  AST 9  ALT 8  ALKPHOS 128*  BILITOT 0.3  PROT 6.8  ALBUMIN 2.5*   CBC:  Recent Labs Lab 08/21/12 2300  08/24/12 0520 08/25/12 0545 08/26/12 0458 08/26/12 2040 08/27/12 0535  WBC 12.7*  < > 20.5* 15.4* 9.6 6.2 7.1  NEUTROABS 9.5*  --   --   --   --   --   --   HGB 7.9*  < > 9.7* 8.7* 9.0* 9.3* 9.0*  HCT 22.8*  < > 28.1* 26.0* 26.0* 27.3* 26.6*  MCV 90.8  < > 90.4 90.3 90.9 91.9 91.7  PLT 128*  < > 101* 80* 79* 83* 86*  < > = values in this interval not displayed.   Recent Results (from the past 240 hour(s))  CULTURE, BLOOD (ROUTINE X 2)     Status: None   Collection Time    08/21/12 11:15 PM      Result Value Range Status   Specimen Description BLOOD RIGHT HAND   Final   Special Requests BOTTLES DRAWN AEROBIC AND ANAEROBIC   Final   Culture  Setup Time     Final   Value: 08/22/2012 04:30     Performed at [facility]   Culture     Final   Value:        BLOOD CULTURE RECEIVED NO GROWTH TO DATE CULTURE WILL BE HELD FOR 5 DAYS BEFORE ISSUING A FINAL NEGATIVE REPORT     Performed at [facility]   Report Status PENDING   Incomplete  CULTURE, BLOOD (ROUTINE  X 2)     Status: None   Collection Time    08/21/12 11:50 PM      Result Value Range Status   Specimen Description BLOOD LEFT HAND   Final   Special Requests BOTTLES DRAWN AEROBIC AND ANAEROBIC   Final   Culture  Setup Time     Final   Value: 08/22/2012 04:30     Performed at [facility]   Culture     Final   Value:        BLOOD CULTURE RECEIVED NO GROWTH TO DATE CULTURE WILL BE HELD FOR 5 DAYS BEFORE ISSUING A FINAL NEGATIVE REPORT     Performed at [facility]   Report Status PENDING   Incomplete  SURGICAL PCR SCREEN     Status: None   Collection Time    08/26/12 10:18 AM      Result Value Range Status   MRSA, PCR NEGATIVE  NEGATIVE Final   Staphylococcus aureus NEGATIVE  NEGATIVE Final   Comment:            The Xpert SA Assay (FDA     approved for NASAL specimens     in patients over 80 years of age),     is one component of     a comprehensive surveillance     program.  Test performance has     been validated by [employer] for patients greater     than or equal to 2 year old.     It is not intended     to diagnose infection nor to     guide or monitor treatment.  TISSUE CULTURE     Status: None   Collection Time    08/26/12  5:54 PM      Result Value Range Status   Specimen Description FOOT LEFT   Final   Special Requests VANCOMYCIAN   Final   Gram Stain     Final   Value: NO WBC SEEN     NO SQUAMOUS EPITHELIAL CELLS SEEN     NO ORGANISMS SEEN     Performed at [facility]   Culture PENDING   Incomplete   Report Status PENDING   Incomplete  ANAEROBIC CULTURE     Status: None   Collection Time    08/26/12  5:54 PM      Result Value Range Status   Specimen Description FOOT LEFT   Final   Special Requests VANCOMYCIN   Final   Gram Stain     Final   Value: NO WBC SEEN     NO SQUAMOUS EPITHELIAL CELLS SEEN     NO ORGANISMS SEEN     Performed at [facility]   Culture PENDING   Incomplete   Report Status PENDING   Incomplete     Scheduled Meds: . [START ON 08/28/2012] aspirin EC  81 mg Oral Daily  . atorvastatin  20 mg Oral q1800  . clopidogrel  75 mg Oral Daily  . diazepam  2 mg Oral On Call  . feeding supplement  237 mL Oral BID BM  . gabapentin  600 mg Oral Daily  . lisinopril  20 mg Oral Daily  . multivitamin with minerals  1 tablet Oral Daily  . nortriptyline  75 mg Oral QHS  . piperacillin-tazobactam (ZOSYN)  IV  3.375 g Intravenous Q8H  . sodium chloride  3 mL Intravenous Q12H  .  tamsulosin  0.4 mg Oral Daily  . vancomycin  1,250 mg Intravenous Q12H   Continuous Infusions: . sodium chloride 1 mL/kg/hr (08/27/12 0602)  . sodium chloride 75 mL/hr at 08/26/12 1840     Debbora Presto, MD  [facility] Pager [phone]  If 7PM-7AM, please contact night-coverage [email or web address] Password [facility] 08/27/2012, 6:41 AM   LOS: 6 days

## 2012-08-27 NOTE — Progress Notes (Signed)
Site area: right groin  Site Prior to Removal:  Level 0  Pressure Applied For 20 MINUTES    Minutes Beginning at 1405  Manual:   yes  Patient Status During Pull:  stabel  Post Pull Groin Site:  Level 0  Post Pull Instructions Given:  yes  Post Pull Pulses Present:  yes  Dressing Applied:  yes  Comments:  Gauze pressure dressing applied, secured with medipore tape

## 2012-08-27 NOTE — H&P (Signed)
    Pt was reexamined and existing H & P reviewed. No changes found.  Runell Gess, MD [facility] 08/27/2012 9:57 AM

## 2012-08-27 NOTE — Progress Notes (Signed)
I have discussed plan with nurse in charge and pt was told that he is staying in [facility] hospital overnight as he needs additional procedure. Will follow up on Dr. Myra Gianotti recommendations.   Debbora Presto, MD  [facility] Pager [phone]  If 7PM-7AM, please contact night-coverage [email or web address] Password [email or web address]

## 2012-08-27 NOTE — Progress Notes (Signed)
NUTRITION FOLLOW UP  Intervention:    1. Continue Ensure Complete po BID, each supplement provides 350 kcal and 13 grams of protein.  2. Discussed with pt importance of adequate nutrition.   Nutrition Dx:   Inadequate oral intake related to lung cancer as evidenced by reported 20 lb weight loss in one year; ongoing.   Goal:  Pt to meet >/= 90% of their estimated nutrition needs, not met.   Monitor:  Weight, po intake, labs  Assessment:   Pt with history of lung cancer admitted with cellulitis of left foot.  Pt s/p cath 8/12. Pt reports decreased appetite consuming ~ 50% of his meals and one ensure per day.   Height: Ht Readings from Last 1 Encounters:  08/22/12 5\' 6"  (1.676 m)    Weight Status:   Wt Readings from Last 1 Encounters:  08/22/12 136 lb 14.5 oz (62.1 kg)  No new weight Usual body weight 156 lb   Re-estimated needs:  Kcal: 1550-1650  Protein: 80-90 g  Fluid: >1.7 L  Skin: incision, left foot wound  Diet Order: Cardiac   Intake/Output Summary (Last 24 hours) at 08/27/12 1442 Last data filed at 08/27/12 0900  Gross per 24 hour  Intake    700 ml  Output    550 ml  Net    150 ml    Last BM: 8/10   Labs:   Recent Labs Lab 08/22/12 08/22/12 1130  08/26/12 0458 08/26/12 2040 08/27/12 0535  NA  --   --   < > 128* 129* 130*  K  --   --   < > 3.6 2.9* 3.3*  CL  --   --   < > 92* 96 95*  CO2  --   --   < > 27 25 26   BUN  --   --   < > 6 7 7   CREATININE  --   --   < > 0.53 0.50 0.50  CALCIUM  --   --   < > 8.6 8.9 8.5  MG 0.9* 1.7  --   --   --   --   GLUCOSE  --   --   < > 98 86 103*  < > = values in this interval not displayed.  CBG (last 3)  No results found for this basename: GLUCAP,  in the last 72 hours  Scheduled Meds: . aspirin EC  325 mg Oral Daily  . atorvastatin  20 mg Oral q1800  . [START ON 08/28/2012] clopidogrel  75 mg Oral Q breakfast  . feeding supplement  237 mL Oral BID BM  . gabapentin  600 mg Oral Daily  . lisinopril   20 mg Oral Daily  . multivitamin with minerals  1 tablet Oral Daily  . nortriptyline  75 mg Oral QHS  . piperacillin-tazobactam (ZOSYN)  IV  3.375 g Intravenous Q8H  . tamsulosin  0.4 mg Oral Daily  . vancomycin  1,250 mg Intravenous Q12H    Continuous Infusions: . sodium chloride      Kendell Bane RD, LDN, CNSC [phone] Pager [phone] After Hours Pager

## 2012-08-27 NOTE — Consult Note (Signed)
[facility] [place]      Consult Note  Patient name: [name] MRN: [id number] DOB: [birth year] Sex: male  Consulting Physician:  Dr. Allyson Sabal  Reason for Consult:  Chief Complaint  Patient presents with  . Fever    HISTORY OF PRESENT ILLNESS: This is a 62 yo male who was admitted with a left 5th toe ulcer, present for several weeks.  He underwent 4th and 5th ray amputation by Dr. Victorino Dike. MRI suggested early osteo and cellulitis. He suffers from PVD and has previously undergone atherectomy and angioplasty of his left SFA in 2012.  In 2013 he required stenting for a total occlusion.  He also underwent additional atherectomy later in 2013.  He underwent angiogram earlier today which revealed an occluded SFA with reconstitution of the above knee popliteal artery with single vessel runoff via the peroneal artery.  The patient is currently undergoing chemotherapy for stage IV lung cancer, dx'd in March 2014.  The patient suffered a stroke in 2013 with residual right sided weakness.  He is medically managed for his hypercholesterolemia with a statin.  He is also treated for HTN with an ACE inhibitor.  He is an active smoker.  I have been asked to consult for possible surgical bypass due to concerns of healing his toe amputation.  Past Medical History  Diagnosis Date  . BENIGN PROSTATIC HYPERTROPHY 12/24/2009  . CEREBROVASCULAR ACCIDENT, HX OF 12/24/2009  . HYPERLIPIDEMIA 12/24/2009  . HYPERTENSION 12/24/2009  . Testosterone deficiency   . ED (erectile dysfunction)   . Claudication in peripheral vascular disease, lifestyle limiting, with abnormal arterial dopplers of lower ext. 06/30/2011  . S/P angioplasty with stent to Lt. SFA 06/30/11 06/30/2011  . COPD 12/24/2009  . Shortness of breath     "from the COPD" (10/13/2011)  . Stroke 1996    "right side not as strong as left since; numbness R leg/foot" (10/13/2011)  . Pernicious anemia     Past Surgical History  Procedure  Laterality Date  . Cervical fusion  1980's    "total of 3-4 OR's on my neck; pinched nerve" (10/13/2011)  . Aneurysm coiling  2007  . Peripheral arterial stent graft  06/30/2010; 10/13/10    left; left  . Video bronchoscopy Bilateral 04/03/2012    Procedure: VIDEO BRONCHOSCOPY WITHOUT FLUORO;  Surgeon: Nyoka Cowden, MD;  Location: [facility];  Service: Cardiopulmonary;  Laterality: Bilateral;  . Amputation Left 08/26/2012    Procedure: IRRIGATION AND DEBRIDMENT LEFT DORSAL ABSCESS/ 4TH AND 5TH LEFT RAY AMPUTATION ;  Surgeon: Toni Arthurs, MD;  Location: [facility];  Service: Orthopedics;  Laterality: Left;    History   Social History  . Marital Status: Married    Spouse Name: N/A    Number of Children: 3  . Years of Education: N/A   Occupational History  . Retired     Airline pilot   Social History Main Topics  . Smoking status: Current Every Day Smoker -- 0.50 packs/day for 45 years    Types: Cigarettes  . Smokeless tobacco: Never Used  . Alcohol Use: No  . Drug Use: No  . Sexually Active: Not Currently   Other Topics Concern  . Not on file   Social History Narrative  . No narrative on file    Family History  Problem Relation Age of Onset  . Emphysema Father     smoked  . Emphysema Paternal Uncle     smoked  . Heart disease Mother   .  Lung cancer Father     smoked  . Lung cancer Paternal Grandfather     smoked    Allergies as of 08/21/2012  . (No Known Allergies)    No current facility-administered medications on file prior to encounter.   Current Outpatient Prescriptions on File Prior to Encounter  Medication Sig Dispense Refill  . acetaminophen (TYLENOL) 500 MG tablet Take 1,000 mg by mouth every 6 (six) hours as needed for pain.       Marland Kitchen aspirin 81 MG tablet Take 81 mg by mouth daily.      Marland Kitchen atorvastatin (LIPITOR) 20 MG tablet Take 20 mg by mouth daily.      . budesonide-formoterol (SYMBICORT) 160-4.5 MCG/ACT inhaler Inhale 2 puffs into the lungs 2 (two) times  daily as needed (for shortness of breath).      . clopidogrel (PLAVIX) 75 MG tablet Take 75 mg by mouth daily.      Marland Kitchen doxycycline (VIBRA-TABS) 100 MG tablet Take 1 tablet (100 mg total) by mouth 2 (two) times daily.  20 tablet  0  . gabapentin (NEURONTIN) 600 MG tablet Take 600 mg by mouth every morning.       Marland Kitchen lisinopril (PRINIVIL,ZESTRIL) 20 MG tablet Take 20 mg by mouth daily.      . mupirocin cream (BACTROBAN) 2 % Apply topically daily. Onto left plantar wound  15 g  0  . nortriptyline (PAMELOR) 75 MG capsule Take 75 mg by mouth at bedtime.      . prochlorperazine (COMPAZINE) 10 MG tablet Take 10 mg by mouth every 6 (six) hours as needed (for nausea).      . tamsulosin (FLOMAX) 0.4 MG CAPS Take 0.4 mg by mouth daily.          REVIEW OF SYSTEMS: See HPI, all other systems negative  PHYSICAL EXAMINATION: General: The patient appears their stated age.  Vital signs are BP 126/66  Pulse 100  Temp(Src) 98.5 F (36.9 C) (Oral)  Resp 16  Ht 5\' 6"  (1.676 m)  Wt 136 lb 14.5 oz (62.1 kg)  BMI 22.11 kg/m2  SpO2 92% Pulmonary: Respirations are non-labored HEENT:  No gross abnormalities Abdomen: Soft and non-tender  Musculoskeletal: There are no major deformities.   Neurologic: No focal weakness or paresthesias are detected, Skin: left foot dressing intact Psychiatric: The patient has normal affect. Cardiovascular: There is a regular rate and rhythm without significant murmur appreciated.   Assessment:  Left foot ulcer, s/p toe amputation x2 Plan: This is a very complicated situation given that the patient is undergoing active treatment for stage IV lung cancer.  He is at high risk for not healing his amputation site given his poor circulation.  His chances of wound healing would be much higher with revascularization.  I would consider left leg bypass for limb salvage, but the patient will need to be cleared medically.  I would recommend getting oncology involved to evaluate his  pulmonary status and get their input regarding surgical revascularization with regards to how it will impact his lung cancer treatment.  He will need to be off plavix for 5 days before surgery.  I ordered LE vein mapping.  I will follow up with the patient tomorrow.     Jorge Ny, M.D. [facility] [place] Office: [phone] Pager:  [phone]

## 2012-08-28 DIAGNOSIS — Z01810 Encounter for preprocedural cardiovascular examination: Secondary | ICD-10-CM

## 2012-08-28 DIAGNOSIS — I96 Gangrene, not elsewhere classified: Secondary | ICD-10-CM

## 2012-08-28 DIAGNOSIS — Z113 Encounter for screening for infections with a predominantly sexual mode of transmission: Secondary | ICD-10-CM

## 2012-08-28 DIAGNOSIS — I519 Heart disease, unspecified: Secondary | ICD-10-CM

## 2012-08-28 DIAGNOSIS — E871 Hypo-osmolality and hyponatremia: Secondary | ICD-10-CM

## 2012-08-28 LAB — CBC
HCT: 26.7 % — ABNORMAL LOW (ref 39.0–52.0)
Hemoglobin: 9.3 g/dL — ABNORMAL LOW (ref 13.0–17.0)
MCH: 31.5 pg (ref 26.0–34.0)
RBC: 2.95 MIL/uL — ABNORMAL LOW (ref 4.22–5.81)

## 2012-08-28 LAB — BASIC METABOLIC PANEL
CO2: 26 mEq/L (ref 19–32)
Chloride: 97 mEq/L (ref 96–112)
GFR calc non Af Amer: >90 mL/min (ref >90)
Glucose, Bld: 116 mg/dL — ABNORMAL HIGH (ref 70–99)
Potassium: 3.1 mEq/L — ABNORMAL LOW (ref 3.5–5.1)
Sodium: 132 mEq/L — ABNORMAL LOW (ref 135–145)

## 2012-08-28 LAB — CULTURE, BLOOD (ROUTINE X 2): Culture: NO GROWTH

## 2012-08-28 MED ORDER — METOPROLOL TARTRATE 12.5 MG HALF TABLET
12.5000 mg | ORAL_TABLET | Freq: Two times a day (BID) | ORAL | Status: DC
  Administered 2012-08-28 – 2012-08-29 (×3): 12.5 mg via ORAL
  Filled 2012-08-28 (×4): qty 1

## 2012-08-28 MED ORDER — POTASSIUM CHLORIDE CRYS ER 10 MEQ PO TBCR
20.0000 meq | EXTENDED_RELEASE_TABLET | Freq: Two times a day (BID) | ORAL | Status: DC
  Administered 2012-08-28 – 2012-08-29 (×2): 20 meq via ORAL
  Filled 2012-08-28 (×3): qty 2

## 2012-08-28 MED ORDER — POTASSIUM CHLORIDE CRYS ER 20 MEQ PO TBCR
40.0000 meq | EXTENDED_RELEASE_TABLET | Freq: Once | ORAL | Status: AC
  Administered 2012-08-28: 09:00:00 40 meq via ORAL

## 2012-08-28 MED ORDER — METRONIDAZOLE 500 MG PO TABS
500.0000 mg | ORAL_TABLET | Freq: Three times a day (TID) | ORAL | Status: DC
  Administered 2012-08-28 – 2012-08-29 (×3): 500 mg via ORAL
  Filled 2012-08-28 (×6): qty 1

## 2012-08-28 MED ORDER — DEXTROSE 5 % IV SOLN
2.0000 g | Freq: Three times a day (TID) | INTRAVENOUS | Status: DC
  Administered 2012-08-28 – 2012-08-29 (×3): 2 g via INTRAVENOUS
  Filled 2012-08-28 (×6): qty 2

## 2012-08-28 MED FILL — Aspirin Chew Tab 81 MG: ORAL | Qty: 3000 | Status: AC

## 2012-08-28 MED FILL — Lidocaine HCl Local Preservative Free (PF) Inj 1%: INTRAMUSCULAR | Qty: 30 | Status: AC

## 2012-08-28 MED FILL — Heparin Sodium (Porcine) Inj 1000 Unit/ML: INTRAMUSCULAR | Qty: 10 | Status: AC

## 2012-08-28 MED FILL — Midazolam HCl Inj 2 MG/2ML (Base Equivalent): INTRAMUSCULAR | Qty: 2 | Status: AC

## 2012-08-28 MED FILL — Heparin Sodium (Porcine) 2 Unit/ML in Sodium Chloride 0.9%: INTRAMUSCULAR | Qty: 1000 | Status: AC

## 2012-08-28 MED FILL — Fentanyl Citrate Inj 0.05 MG/ML: INTRAMUSCULAR | Qty: 2 | Status: AC

## 2012-08-28 NOTE — Progress Notes (Signed)
[facility]    Subjective: No new complaints   Antibiotics:  Anti-infectives   Start     Dose/Rate Route Frequency Ordered Stop   08/25/12 1000  vancomycin (VANCOCIN) 1,250 mg in sodium chloride 0.9 % 250 mL IVPB     1,250 mg 166.7 mL/hr over 90 Minutes Intravenous Every 12 hours 08/25/12 0505     08/23/12 1400  piperacillin-tazobactam (ZOSYN) IVPB 3.375 g     3.375 g 12.5 mL/hr over 240 Minutes Intravenous 3 times per day 08/23/12 0712     08/23/12 0715  ceFAZolin (ANCEF) IVPB 2 g/50 mL premix  Status:  Discontinued     2 g 100 mL/hr over 30 Minutes Intravenous 3 times per day 08/23/12 0710 08/23/12 0712   08/22/12 1200  vancomycin (VANCOCIN) IVPB 1000 mg/200 mL premix  Status:  Discontinued     1,000 mg 200 mL/hr over 60 Minutes Intravenous Every 12 hours 08/22/12 0532 08/25/12 0508   08/22/12 0600  piperacillin-tazobactam (ZOSYN) IVPB 3.375 g  Status:  Discontinued     3.375 g 12.5 mL/hr over 240 Minutes Intravenous 3 times per day 08/22/12 0532 08/23/12 0710   08/22/12 0115  ciprofloxacin (CIPRO) IVPB 400 mg  Status:  Discontinued     400 mg 200 mL/hr over 60 Minutes Intravenous Every 12 hours 08/22/12 0113 08/22/12 1804   08/22/12 0030  vancomycin (VANCOCIN) IVPB 1000 mg/200 mL premix     1,000 mg 200 mL/hr over 60 Minutes Intravenous  Once 08/22/12 0018 08/22/12 0201   08/22/12 0030  piperacillin-tazobactam (ZOSYN) IVPB 3.375 g     3.375 g 100 mL/hr over 30 Minutes Intravenous  Once 08/22/12 0018 08/22/12 0100      Medications: Scheduled Meds: . aspirin EC  325 mg Oral Daily  . atorvastatin  20 mg Oral q1800  . clopidogrel  75 mg Oral Q breakfast  . feeding supplement  237 mL Oral BID BM  . gabapentin  600 mg Oral Daily  . lisinopril  20 mg Oral Daily  . metoprolol tartrate  12.5 mg Oral BID  . multivitamin with minerals  1 tablet Oral Daily  . nortriptyline  75 mg Oral QHS  . piperacillin-tazobactam (ZOSYN)  IV  3.375 g  Intravenous Q8H  . potassium chloride  20 mEq Oral BID  . tamsulosin  0.4 mg Oral Daily  . vancomycin  1,250 mg Intravenous Q12H   Continuous Infusions:  PRN Meds:.acetaminophen, budesonide-formoterol, HYDROmorphone (DILAUDID) injection, LORazepam, ondansetron (ZOFRAN) IV, oxyCODONE, oxyCODONE-acetaminophen, prochlorperazine, traMADol   Objective: Weight change:   Intake/Output Summary (Last 24 hours) at 08/28/12 2037 Last data filed at 08/28/12 1900  Gross per 24 hour  Intake    240 ml  Output      3 ml  Net    237 ml   Blood pressure 133/67, pulse 103, temperature 97.9 F (36.6 C), temperature source Oral, resp. rate 15, height 5\' 6"  (1.676 m), weight 128 lb 15.5 oz (58.5 kg), SpO2 97.00%. Temp:  [97.7 F (36.5 C)-98.2 F (36.8 C)] 97.9 F (36.6 C) (08/13 2005) Pulse Rate:  [90-103] 103 (08/13 2005) Resp:  [14-18] 15 (08/13 2005) BP: (131-158)/(53-67) 133/67 mmHg (08/13 2005) SpO2:  [92 %-97 %] 97 % (08/13 2005) Weight:  [128 lb 15.5 oz (58.5 kg)] 128 lb 15.5 oz (58.5 kg) (08/13 0051)  Physical Exam: General: Alert and awake, oriented x3, not in any acute distress. HEENT: anicteric sclera, EOMI CVS regular rate, normal r,  no  murmur rubs or gallops Chest: clear to auscultation bilaterally, no wheezing, rales or rhonchi Abdomen: softnondistended, normal bowel sounds, Extremities: right foot bandaged  Neuro: nonfocal  Lab Results:  Recent Labs  08/27/12 0535 08/28/12 0750  WBC 7.1 5.6  HGB 9.0* 9.3*  HCT 26.6* 26.7*  PLT 86* 85*    BMET  Recent Labs  08/27/12 0535 08/28/12 0750  NA 130* 132*  K 3.3* 3.1*  CL 95* 97  CO2 26 26  GLUCOSE 103* 116*  BUN 7 4*  CREATININE 0.50 0.52  CALCIUM 8.5 9.1    Micro Results: Recent Results (from the past 240 hour(s))  CULTURE, BLOOD (ROUTINE X 2)     Status: None   Collection Time    08/21/12 11:15 PM      Result Value Range Status   Specimen Description BLOOD RIGHT HAND   Final   Special Requests BOTTLES  DRAWN AEROBIC AND ANAEROBIC   Final   Culture  Setup Time     Final   Value: 08/22/2012 04:30     Performed at [facility]   Culture     Final   Value: NO GROWTH 5 DAYS     Performed at [facility]   Report Status 08/28/2012 FINAL   Final  CULTURE, BLOOD (ROUTINE X 2)     Status: None   Collection Time    08/21/12 11:50 PM      Result Value Range Status   Specimen Description BLOOD LEFT HAND   Final   Special Requests BOTTLES DRAWN AEROBIC AND ANAEROBIC   Final   Culture  Setup Time     Final   Value: 08/22/2012 04:30     Performed at [facility]   Culture     Final   Value: NO GROWTH 5 DAYS     Performed at [facility]   Report Status 08/28/2012 FINAL   Final  SURGICAL PCR SCREEN     Status: None   Collection Time    08/26/12 10:18 AM      Result Value Range Status   MRSA, PCR NEGATIVE  NEGATIVE Final   Staphylococcus aureus NEGATIVE  NEGATIVE Final   Comment:            The Xpert SA Assay (FDA     approved for NASAL specimens     in patients over 77 years of age),     is one component of     a comprehensive surveillance     program.  Test performance has     been validated by [employer] for patients greater     than or equal to 47 year old.     It is not intended     to diagnose infection nor to     guide or monitor treatment.  TISSUE CULTURE     Status: None   Collection Time    08/26/12  5:54 PM      Result Value Range Status   Specimen Description FOOT LEFT   Final   Special Requests VANCOMYCIAN   Final   Gram Stain     Final   Value: NO WBC SEEN     NO SQUAMOUS EPITHELIAL CELLS SEEN     NO ORGANISMS SEEN     Performed at [facility]   Culture     Final   Value: NO GROWTH 1 DAY     Performed at  [facility]   Report Status PENDING   Incomplete  ANAEROBIC CULTURE     Status: None   Collection Time    08/26/12  5:54 PM      Result Value Range Status   Specimen Description FOOT LEFT   Final     Special Requests VANCOMYCIN   Final   Gram Stain     Final   Value: NO WBC SEEN     NO SQUAMOUS EPITHELIAL CELLS SEEN     NO ORGANISMS SEEN     Performed at [facility]   Culture     Final   Value: NO ANAEROBES ISOLATED; CULTURE IN PROGRESS FOR 5 DAYS     Performed at [facility]   Report Status PENDING   Incomplete    Studies/Results: No results found.    Assessment/Plan: [name] is a 62 y.o. male with  Gangrene, abscess, osteomyelitis sp 4th, 5th ray amputaion and I and D of foot abscess  #1 Osteomyelitis: no organism isolated on cx so far  --I will change the pt to vancomcyin, cefepime and oral flagyl for ease of home IV abx administration  --would continue 4-6 weeks postop with close follup with Orthopedics , ID and with re-vascularization planned next week by [facility]  #2 Screening: check HIV and Hep C     LOS: 7 days   Acey Lav 08/28/2012, 8:37 PM

## 2012-08-28 NOTE — Progress Notes (Signed)
[facility] Progress Note  08/28/2012  Subjective: Pt reports that he is wanting to go home.  He has not fully made a decision about proceeding with vascular surgery.  He denies pain and discomfort.  No CP or SOB.    Objective:  Vital signs in last 24 hours: Filed Vitals:   08/28/12 0051 08/28/12 0508 08/28/12 0814 08/28/12 1243  BP: 131/66 150/61 158/60 144/53  Pulse: 99 101 100 90  Temp: 98 F (36.7 C) 97.8 F (36.6 C) 97.7 F (36.5 C) 98.2 F (36.8 C)  TempSrc: Oral Oral Oral Oral  Resp: 17 14 16 16   Height:      Weight: 58.5 kg (128 lb 15.5 oz)     SpO2: 93% 94% 95% 92%   Weight change:   Intake/Output Summary (Last 24 hours) at 08/28/12 1405 Last data filed at 08/27/12 2000  Gross per 24 hour  Intake    240 ml  Output    250 ml  Net    -10 ml   No results found for this basename: HGBA1C   Lab Results  Component Value Date   CREATININE 0.52 08/28/2012    Review of Systems As above, otherwise all reviewed and reported negative  Physical Exam General - awake, no distress, cooperative HEENT - NCAT, MMM Lungs - BBS, CTA CV - normal s1, s2 sounds Abd - soft, nondistended, no masses, nontender Ext - wound C/D/I  Lab Results: Results for orders placed during the hospital encounter of 08/21/12 (from the past 24 hour(s))  BASIC METABOLIC PANEL     Status: Abnormal   Collection Time    08/28/12  7:50 AM      Result Value Range   Sodium 132 (*) 135 - 145 mEq/L   Potassium 3.1 (*) 3.5 - 5.1 mEq/L   Chloride 97  96 - 112 mEq/L   CO2 26  19 - 32 mEq/L   Glucose, Bld 116 (*) 70 - 99 mg/dL   BUN 4 (*) 6 - 23 mg/dL   Creatinine, Ser 4.09  0.50 - 1.35 mg/dL   Calcium 9.1  8.4 - 81.1 mg/dL   GFR calc non Af Amer >90  >90 mL/min   GFR calc Af Amer >90  >90 mL/min  CBC     Status: Abnormal   Collection Time    08/28/12  7:50 AM      Result Value Range   WBC 5.6  4.0 - 10.5 K/uL   RBC 2.95 (*) 4.22 - 5.81 MIL/uL   Hemoglobin 9.3 (*) 13.0 - 17.0 g/dL   HCT  91.4 (*) 78.2 - 52.0 %   MCV 90.5  78.0 - 100.0 fL   MCH 31.5  26.0 - 34.0 pg   MCHC 34.8  30.0 - 36.0 g/dL   RDW 95.6 (*) 21.3 - 08.6 %   Platelets 85 (*) 150 - 400 K/uL    Micro Results: Recent Results (from the past 240 hour(s))  CULTURE, BLOOD (ROUTINE X 2)     Status: None   Collection Time    08/21/12 11:15 PM      Result Value Range Status   Specimen Description BLOOD RIGHT HAND   Final   Special Requests BOTTLES DRAWN AEROBIC AND ANAEROBIC   Final   Culture  Setup Time     Final   Value: 08/22/2012 04:30     Performed at [facility]   Culture     Final   Value: NO GROWTH  5 DAYS     Performed at [facility]   Report Status 08/28/2012 FINAL   Final  CULTURE, BLOOD (ROUTINE X 2)     Status: None   Collection Time    08/21/12 11:50 PM      Result Value Range Status   Specimen Description BLOOD LEFT HAND   Final   Special Requests BOTTLES DRAWN AEROBIC AND ANAEROBIC   Final   Culture  Setup Time     Final   Value: 08/22/2012 04:30     Performed at [facility]   Culture     Final   Value: NO GROWTH 5 DAYS     Performed at [facility]   Report Status 08/28/2012 FINAL   Final  SURGICAL PCR SCREEN     Status: None   Collection Time    08/26/12 10:18 AM      Result Value Range Status   MRSA, PCR NEGATIVE  NEGATIVE Final   Staphylococcus aureus NEGATIVE  NEGATIVE Final   Comment:            The Xpert SA Assay (FDA     approved for NASAL specimens     in patients over 58 years of age),     is one component of     a comprehensive surveillance     program.  Test performance has     been validated by [employer] for patients greater     than or equal to 23 year old.     It is not intended     to diagnose infection nor to     guide or monitor treatment.  TISSUE CULTURE     Status: None   Collection Time    08/26/12  5:54 PM      Result Value Range Status   Specimen Description FOOT LEFT   Final   Special Requests  VANCOMYCIAN   Final   Gram Stain     Final   Value: NO WBC SEEN     NO SQUAMOUS EPITHELIAL CELLS SEEN     NO ORGANISMS SEEN     Performed at [facility]   Culture     Final   Value: NO GROWTH 1 DAY     Performed at [facility]   Report Status PENDING   Incomplete  ANAEROBIC CULTURE     Status: None   Collection Time    08/26/12  5:54 PM      Result Value Range Status   Specimen Description FOOT LEFT   Final   Special Requests VANCOMYCIN   Final   Gram Stain     Final   Value: NO WBC SEEN     NO SQUAMOUS EPITHELIAL CELLS SEEN     NO ORGANISMS SEEN     Performed at [facility]   Culture     Final   Value: NO ANAEROBES ISOLATED; CULTURE IN PROGRESS FOR 5 DAYS     Performed at [facility]   Report Status PENDING   Incomplete    Medications:  Scheduled Meds: . aspirin EC  325 mg Oral Daily  . atorvastatin  20 mg Oral q1800  . clopidogrel  75 mg Oral Q breakfast  . feeding supplement  237 mL Oral BID BM  . gabapentin  600 mg Oral Daily  . lisinopril  20 mg Oral Daily  . metoprolol tartrate  12.5 mg Oral BID  .  multivitamin with minerals  1 tablet Oral Daily  . nortriptyline  75 mg Oral QHS  . piperacillin-tazobactam (ZOSYN)  IV  3.375 g Intravenous Q8H  . potassium chloride  20 mEq Oral BID  . tamsulosin  0.4 mg Oral Daily  . vancomycin  1,250 mg Intravenous Q12H   Continuous Infusions:  PRN Meds:.acetaminophen, budesonide-formoterol, HYDROmorphone (DILAUDID) injection, LORazepam, ondansetron (ZOFRAN) IV, oxyCODONE, oxyCODONE-acetaminophen, prochlorperazine, traMADol  Assessment/Plan: Left foot cellulitis, osteomyelitis with left fifth toe ischemia  - erythema and left toe ischemia appear to look worse this AM  - status post Left 4th ray amputation, Left 5th ray amputation, Irrigation and excisional debridement of left dorsal foot abscess, post op day #2, doing well and clinically stable  - continue vanco and zosyn as recommended by ID  day #8  - appreciate infectious disease, cardiology input, ortho input  - arterial doppler reveals sig LE vascular disease (see report)  - vascular surgery planning for possible bypass - spoke with oncologist Dr. [name] who has no problem with proceeding with vascular surgery if needed  Hyponatremia  - sodium slowly trending up 124 --> 132   Hypokalemia and hypomagnesemia  - K-dur 20 MEQ PO BID - BMP in AM   Leukocytosis  - Secondary to left foot cellulitis, osteo, and ischemic left toe  - WBC trending down and is within normal limits   - antibiotics as above   Acute blood loss anemia  - Possibly chemotherapy related,  - status post 2 units of PRBC with appropriate increase in Hg/Hct  - Hg remains stable but slight drop overnight 10.3 --> 9.7 --> 8.7 --> 9.3  Thrombocytopenia  - likely sequela of chemotherapy  - will have to monitor closely as pt in on Lovenox  - CBC in AM  - check HIT panel  COPD, emphysema  - Clinically stable at this time, oxygen saturations at target range    LOS: 7 days   [name] 08/28/2012, 2:05 PM  Rodney Langton, MD, CDE, FAAFP [facility], [place]  Digital Pager [phone]

## 2012-08-28 NOTE — Progress Notes (Signed)
Right Lower Extremity Vein Map    Right Great Saphenous Vein   Segment Diameter Comment  1. Origin 4.51mm   2. High Thigh 3.13mm   3. Mid Thigh 2.75mm   4. Low Thigh 3.24mm   5. At Knee 1.38mm   6. High Calf 2.73mm   7. Low Calf 3.60mm   8. Ankle 2.53mm     Left Lower Extremity Vein Map    Left Great Saphenous Vein   Segment Diameter Comment  1. Origin 5.91mm   2. High Thigh 2.46mm   3. Mid Thigh 3.66mm   4. Low Thigh 3.23mm   5. At Knee 3.102mm   6. High Calf 4.28mm   7. Low Calf 2.64mm   8. Ankle 2.89mm Branch       08/28/2012 3:25 PM Gertie Fey, RVT, RDCS, RDMS

## 2012-08-28 NOTE — Progress Notes (Signed)
[facility]  Subjective  -   No changes overnight   Physical Exam:  CV:  RRR GEN: NAD L LE dsg intact       Assessment/Plan:    The patient will need left leg revascularization most likely for limb salvage.  He will need clearance from his oncologist.  He will need to be off of plavix for 5 days, which means bypass will be next week.  I would continue ABX.  He does not need to be in the hospital until next week.    BRABHAM IV, V. WELLS 08/28/2012 4:18 PM --  Filed Vitals:   08/28/12 1243  BP: 144/53  Pulse: 90  Temp: 98.2 F (36.8 C)  Resp: 16    Intake/Output Summary (Last 24 hours) at 08/28/12 1618 Last data filed at 08/27/12 2000  Gross per 24 hour  Intake    240 ml  Output    250 ml  Net    -10 ml     Laboratory CBC    Component Value Date/Time   WBC 5.6 08/28/2012 0750   WBC 2.7 Repeated and Verified* 08/15/2012 1117   HGB 9.3* 08/28/2012 0750   HGB 8.3 Repeated and Verified* 08/15/2012 1117   HCT 26.7* 08/28/2012 0750   HCT 24.2* 08/15/2012 1117   PLT 85* 08/28/2012 0750   PLT 187 08/15/2012 1117    BMET    Component Value Date/Time   NA 132* 08/28/2012 0750   NA 135* 08/15/2012 1117   K 3.1* 08/28/2012 0750   K 3.4* 08/15/2012 1117   CL 97 08/28/2012 0750   CL 97* 06/13/2012 1300   CO2 26 08/28/2012 0750   CO2 25 08/15/2012 1117   GLUCOSE 116* 08/28/2012 0750   GLUCOSE 115 08/15/2012 1117   GLUCOSE 123* 06/13/2012 1300   BUN 4* 08/28/2012 0750   BUN 7.1 08/15/2012 1117   CREATININE 0.52 08/28/2012 0750   CREATININE 0.7 08/15/2012 1117   CALCIUM 9.1 08/28/2012 0750   CALCIUM 9.0 08/15/2012 1117   GFRNONAA >90 08/28/2012 0750   GFRAA >90 08/28/2012 0750    COAG Lab Results  Component Value Date   INR 1.13 08/26/2012   No results found for this basename: PTT    Antibiotics Anti-infectives   Start     Dose/Rate Route Frequency Ordered Stop   08/25/12 1000  vancomycin (VANCOCIN) 1,250 mg in sodium chloride 0.9 % 250  mL IVPB     1,250 mg 166.7 mL/hr over 90 Minutes Intravenous Every 12 hours 08/25/12 0505     08/23/12 1400  piperacillin-tazobactam (ZOSYN) IVPB 3.375 g     3.375 g 12.5 mL/hr over 240 Minutes Intravenous 3 times per day 08/23/12 0712     08/23/12 0715  ceFAZolin (ANCEF) IVPB 2 g/50 mL premix  Status:  Discontinued     2 g 100 mL/hr over 30 Minutes Intravenous 3 times per day 08/23/12 0710 08/23/12 0712   08/22/12 1200  vancomycin (VANCOCIN) IVPB 1000 mg/200 mL premix  Status:  Discontinued     1,000 mg 200 mL/hr over 60 Minutes Intravenous Every 12 hours 08/22/12 0532 08/25/12 0508   08/22/12 0600  piperacillin-tazobactam (ZOSYN) IVPB 3.375 g  Status:  Discontinued     3.375 g 12.5 mL/hr over 240 Minutes Intravenous 3 times per day 08/22/12 0532 08/23/12 0710   08/22/12 0115  ciprofloxacin (CIPRO) IVPB 400 mg  Status:  Discontinued     400 mg 200 mL/hr over 60 Minutes  Intravenous Every 12 hours 08/22/12 0113 08/22/12 1804   08/22/12 0030  vancomycin (VANCOCIN) IVPB 1000 mg/200 mL premix     1,000 mg 200 mL/hr over 60 Minutes Intravenous  Once 08/22/12 0018 08/22/12 0201   08/22/12 0030  piperacillin-tazobactam (ZOSYN) IVPB 3.375 g     3.375 g 100 mL/hr over 30 Minutes Intravenous  Once 08/22/12 0018 08/22/12 0100       V. Charlena Cross, M.D. [facility] Office: [phone] Pager:  [phone]

## 2012-08-28 NOTE — Progress Notes (Signed)
Echo Lab  2D Echocardiogram completed.  Uvaldo Rybacki L Eliberto Sole, RDCS 08/28/2012 2:52 PM

## 2012-08-28 NOTE — Progress Notes (Signed)
Subjective: 1 Day Post-Op Procedure(s) (LRB): LOWER EXTREMITY ANGIOGRAM (Left) ABDOMINAL ANGIOGRAM Patient reports pain as mild in the foot.  Awaiting input from oncologist re: feasibility of surgical bypass.  Pt wants to go home.  Objective: Vital signs in last 24 hours: Temp:  [97.7 F (36.5 C)-98.2 F (36.8 C)] 97.9 F (36.6 C) (08/13 2005) Pulse Rate:  [90-103] 103 (08/13 2005) Resp:  [14-18] 15 (08/13 2005) BP: (131-158)/(53-67) 133/67 mmHg (08/13 2005) SpO2:  [92 %-97 %] 97 % (08/13 2005) Weight:  [58.5 kg (128 lb 15.5 oz)] 58.5 kg (128 lb 15.5 oz) (08/13 0051)  Intake/Output from previous day: 08/12 0701 - 08/13 0700 In: 240 [P.O.:240] Out: 250 [Urine:250] Intake/Output this shift:     Recent Labs  08/26/12 0458 08/26/12 2040 08/27/12 0535 08/28/12 0750  HGB 9.0* 9.3* 9.0* 9.3*    Recent Labs  08/27/12 0535 08/28/12 0750  WBC 7.1 5.6  RBC 2.90* 2.95*  HCT 26.6* 26.7*  PLT 86* 85*    Recent Labs  08/27/12 0535 08/28/12 0750  NA 130* 132*  K 3.3* 3.1*  CL 95* 97  CO2 26 26  BUN 7 4*  CREATININE 0.50 0.52  GLUCOSE 103* 116*  CALCIUM 8.5 9.1    Recent Labs  08/26/12 2040  INR 1.13    PE:  Foot dressed and dry.  1-3 toes with cap refill < 2 sec.  Assessment/Plan: 1 Day Post-Op Procedure(s) (LRB): LOWER EXTREMITY ANGIOGRAM (Left) ABDOMINAL ANGIOGRAM Await info regarding revascularization by Dr. Myra Gianotti.  Continue current dressing.  Toni Arthurs 08/28/2012, 10:04 PM

## 2012-08-28 NOTE — Progress Notes (Signed)
Subjective:  No complaints of chest pain or SOB  Objective:  Vital Signs in the last 24 hours: Temp:  [97.7 F (36.5 C)-98.5 F (36.9 C)] 97.7 F (36.5 C) (08/13 0814) Pulse Rate:  [85-101] 100 (08/13 0814) Resp:  [14-18] 16 (08/13 0814) BP: (126-158)/(55-67) 158/60 mmHg (08/13 0814) SpO2:  [92 %-98 %] 95 % (08/13 0814) Weight:  [128 lb 15.5 oz (58.5 kg)] 128 lb 15.5 oz (58.5 kg) (08/13 0051)  Intake/Output from previous day:  Intake/Output Summary (Last 24 hours) at 08/28/12 0918 Last data filed at 08/27/12 2000  Gross per 24 hour  Intake    240 ml  Output    250 ml  Net    -10 ml    Physical Exam: General appearance: alert, cooperative and no distress Neck: surgical scar Rt neck Lungs: scattered rhonchi Heart: regular rate and rhythm and soft systolic murmur at AOV area BS+ Extremities: surgical dressing and boot on Lt, Rt groin without hematoma   Rate: 98  Rhythm: normal sinus rhythm  Lab Results:  Recent Labs  08/27/12 0535 08/28/12 0750  WBC 7.1 5.6  HGB 9.0* 9.3*  PLT 86* 85*    Recent Labs  08/27/12 0535 08/28/12 0750  NA 130* 132*  K 3.3* 3.1*  CL 95* 97  CO2 26 26  GLUCOSE 103* 116*  BUN 7 4*  CREATININE 0.50 0.52   No results found for this basename: TROPONINI, CK, MB,  in the last 72 hours Hepatic Function Panel No results found for this basename: PROT, ALBUMIN, AST, ALT, ALKPHOS, BILITOT, BILIDIR, IBILI,  in the last 72 hours No results found for this basename: CHOL,  in the last 72 hours  Recent Labs  08/26/12 2040  INR 1.13    Imaging: Imaging results have been reviewed  Cardiac Studies:  Assessment/Plan:   Principal Problem:   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 SFA 06/30/11 (for ISR from 9/12)   PVD, s/p Rt SFA PTA/ stenting 10/13/11   Lung cancer- diagnosed March 2014- currently getting  chemo   Thrombocytopenia-(new this admission)   HYPERLIPIDEMIA   HYPERTENSION   COPD   BENIGN PROSTATIC HYPERTROPHY   CEREBROVASCULAR ACCIDENT, HX OF   Tobacco abuse   Hypokalemia    PLAN: Per Dr Myra Gianotti. The pt had a low risk Myoview in July 2012. Will check an echo for LVF pre op. Replace K+ this am. Anemioa and throbocytopenia probably from chemotherapy. Add low dose beta blocker for HTN and tachycardia.   Corine Shelter PA-C Beeper [phone] 08/28/2012, 9:18 AM    Patient seen and examined. Agree with assessment and plan. No chest pain. For LE vein mapping and probable leg leg bypass for limb salvage. Plt 85K. Consider HIT panel since heparin has been received and per Dr. Myra Gianotti oncological evaluation.   Lennette Bihari, MD, [facility] 08/28/2012 10:19 AM

## 2012-08-29 ENCOUNTER — Other Ambulatory Visit: Payer: Medicare Other | Admitting: Lab

## 2012-08-29 ENCOUNTER — Ambulatory Visit: Payer: Medicare Other

## 2012-08-29 DIAGNOSIS — C3490 Malignant neoplasm of unspecified part of unspecified bronchus or lung: Secondary | ICD-10-CM

## 2012-08-29 LAB — C-REACTIVE PROTEIN: CRP: 8.2 mg/dL — ABNORMAL HIGH (ref <0.60)

## 2012-08-29 LAB — CBC
MCV: 90.6 fL (ref 78.0–100.0)
Platelets: 115 10*3/uL — ABNORMAL LOW (ref 150–400)
RBC: 3.1 MIL/uL — ABNORMAL LOW (ref 4.22–5.81)
RDW: 17.2 % — ABNORMAL HIGH (ref 11.5–15.5)
WBC: 6 10*3/uL (ref 4.0–10.5)

## 2012-08-29 LAB — BASIC METABOLIC PANEL
BUN: 5 mg/dL — ABNORMAL LOW (ref 6–23)
CO2: 24 mEq/L (ref 19–32)
Calcium: 9.3 mg/dL (ref 8.4–10.5)
Chloride: 97 mEq/L (ref 96–112)
Creatinine, Ser: 0.6 mg/dL (ref 0.50–1.35)
GFR calc Af Amer: >90 mL/min (ref >90)
GFR calc non Af Amer: >90 mL/min (ref >90)
Glucose, Bld: 112 mg/dL — ABNORMAL HIGH (ref 70–99)
Potassium: 3.6 mEq/L (ref 3.5–5.1)
Sodium: 132 mEq/L — ABNORMAL LOW (ref 135–145)

## 2012-08-29 LAB — VANCOMYCIN, TROUGH: Vancomycin Tr: 19.4 ug/mL (ref 10.0–20.0)

## 2012-08-29 LAB — SEDIMENTATION RATE: Sed Rate: 119 mm/hr — ABNORMAL HIGH (ref 0–16)

## 2012-08-29 MED ORDER — OXYCODONE-ACETAMINOPHEN 5-325 MG PO TABS: 1.0000 | ORAL_TABLET | Freq: Three times a day (TID) | ORAL | Status: DC | PRN

## 2012-08-29 MED ORDER — DEXTROSE 5 % IV SOLN: 2.0000 g | Freq: Three times a day (TID) | INTRAVENOUS | Status: DC

## 2012-08-29 MED ORDER — HEPARIN SODIUM (PORCINE) 5000 UNIT/ML IJ SOLN
5000.0000 [IU] | Freq: Three times a day (TID) | INTRAMUSCULAR | Status: DC
  Filled 2012-08-29 (×3): qty 1

## 2012-08-29 MED ORDER — ENSURE COMPLETE PO LIQD: 237.0000 mL | Freq: Two times a day (BID) | ORAL | Status: DC

## 2012-08-29 MED ORDER — POTASSIUM CHLORIDE CRYS ER 20 MEQ PO TBCR: 40.0000 meq | EXTENDED_RELEASE_TABLET | Freq: Every day | ORAL | Status: DC

## 2012-08-29 MED ORDER — HEPARIN SOD (PORK) LOCK FLUSH 100 UNIT/ML IV SOLN
250.0000 [IU] | INTRAVENOUS | Status: DC | PRN
  Administered 2012-08-29: 17:00:00 250 [IU]
  Filled 2012-08-29: qty 3

## 2012-08-29 MED ORDER — METRONIDAZOLE 500 MG PO TABS: 500.0000 mg | ORAL_TABLET | Freq: Three times a day (TID) | ORAL | Status: DC

## 2012-08-29 MED ORDER — SODIUM CHLORIDE 0.9 % IJ SOLN
10.0000 mL | INTRAMUSCULAR | Status: DC | PRN
  Administered 2012-08-29: 10 mL

## 2012-08-29 MED ORDER — HEPARIN SOD (PORK) LOCK FLUSH 100 UNIT/ML IV SOLN
250.0000 [IU] | Freq: Every day | INTRAVENOUS | Status: DC
  Filled 2012-08-29: qty 3

## 2012-08-29 MED ORDER — POTASSIUM CHLORIDE CRYS ER 20 MEQ PO TBCR: 40.0000 meq | EXTENDED_RELEASE_TABLET | Freq: Two times a day (BID) | ORAL | Status: DC

## 2012-08-29 MED ORDER — ENOXAPARIN SODIUM 40 MG/0.4ML ~~LOC~~ SOLN
40.0000 mg | SUBCUTANEOUS | Status: DC
  Administered 2012-08-29: 40 mg via SUBCUTANEOUS
  Filled 2012-08-29: qty 0.4

## 2012-08-29 MED ORDER — VANCOMYCIN HCL 10 G IV SOLR: 1250.0000 mg | Freq: Two times a day (BID) | INTRAVENOUS | Status: DC

## 2012-08-29 NOTE — Progress Notes (Signed)
Subjective:  No increased SOB  Objective:  Vital Signs in the last 24 hours: Temp:  [97.7 F (36.5 C)-98.5 F (36.9 C)] 98.3 F (36.8 C) (08/14 0751) Pulse Rate:  [90-103] 102 (08/14 0751) Resp:  [15-20] 18 (08/14 0751) BP: (122-158)/(53-71) 122/59 mmHg (08/14 0751) SpO2:  [92 %-98 %] 98 % (08/14 0751) Weight:  [121 lb 14.6 oz (55.3 kg)] 121 lb 14.6 oz (55.3 kg) (08/14 0025)  Intake/Output from previous day:  Intake/Output Summary (Last 24 hours) at 08/29/12 0809 Last data filed at 08/28/12 1900  Gross per 24 hour  Intake    240 ml  Output      3 ml  Net    237 ml    Physical Exam: General appearance: alert, cooperative and no distress Lungs: decreased breath sounds Heart: regular rate and rhythm   Rate: 105  Rhythm: normal sinus rhythm and sinus tachycardia  Lab Results:  Recent Labs  08/28/12 0750 08/29/12 0540  WBC 5.6 6.0  HGB 9.3* 9.9*  PLT 85* 115*    Recent Labs  08/28/12 0750 08/29/12 0540  NA 132* 132*  K 3.1* 3.6  CL 97 97  CO2 26 24  GLUCOSE 116* 112*  BUN 4* 5*  CREATININE 0.52 0.60   No results found for this basename: TROPONINI, CK, MB,  in the last 72 hours Hepatic Function Panel No results found for this basename: PROT, ALBUMIN, AST, ALT, ALKPHOS, BILITOT, BILIDIR, IBILI,  in the last 72 hours No results found for this basename: CHOL,  in the last 72 hours  Recent Labs  08/26/12 2040  INR 1.13    Imaging: Imaging results have been reviewed  Cardiac Studies: 2D- - Left ventricle: The cavity size was normal. Wall thickness was normal. Systolic function was normal. The estimated ejection fraction was in the range of 55% to 60%. Wall motion was normal; there were no regional wall motion abnormalities. Doppler parameters are consistent with abnormal left ventricular relaxation (grade 1 diastolic dysfunction). The E/e' ratio is <10, suggesting normal LV filling pressure.    Assessment/Plan:   Principal Problem:   Gangrene  lt 4th and 5th toes, 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 SFA 06/30/11 (for ISR from 9/12)   PVD, s/p Rt SFA PTA/ stenting 10/13/11   Lung cancer- diagnosed March 2014- currently getting chemo   Thrombocytopenia-(new this admission)   HYPERLIPIDEMIA   HYPERTENSION   COPD   BENIGN PROSTATIC HYPERTROPHY   CEREBROVASCULAR ACCIDENT, HX OF   Tobacco abuse   Hypokalemia    PLAN: Low risk Myoview 7/12. Nl LVF by echo. He should be OK for surgery from cardiology standpoint. We will be available peri-op.  Corine Shelter PA-C Beeper [phone] 08/29/2012, 8:09 AM    Patient seen and examined. Agree with assessment and plan. No chest pain. Now off plavix awaiting PV surgery. Plt increased from 85 to 115.   Lennette Bihari, MD, [facility] 08/29/2012 9:40 AM

## 2012-08-29 NOTE — Progress Notes (Signed)
Subjective: The patient is seen and examined today. His wife is at the bedside. He recently underwent left fourth and fifth toe amputation as well as irrigation and excisional debridement of the dorsal foot abscess for gangrene of the left foot. He is feeling a little bit better today. The patient is considered for left leg revascularization by Dr. Myra Gianotti. He denied having any significant fever or chills. He has no chest pain, shortness breath, cough or hemoptysis.  Objective: Vital signs in last 24 hours: Temp:  [97.7 F (36.5 C)-98.5 F (36.9 C)] 98.3 F (36.8 C) (08/14 0751) Pulse Rate:  [90-103] 102 (08/14 0751) Resp:  [15-20] 18 (08/14 0751) BP: (122-158)/(53-71) 122/59 mmHg (08/14 0751) SpO2:  [92 %-98 %] 98 % (08/14 0751) Weight:  [121 lb 14.6 oz (55.3 kg)] 121 lb 14.6 oz (55.3 kg) (08/14 0025)  Intake/Output from previous day: 08/13 0701 - 08/14 0700 In: 240 [P.O.:240] Out: 3 [Urine:3] Intake/Output this shift:    General appearance: alert, cooperative, fatigued and no distress Resp: clear to auscultation bilaterally Cardio: regular rate and rhythm, S1, S2 normal, no murmur, click, rub or gallop GI: soft, non-tender; bowel sounds normal; no masses,  no organomegaly Extremities: extremities normal, atraumatic, no cyanosis or edema  Lab Results:   Recent Labs  08/28/12 0750 08/29/12 0540  WBC 5.6 6.0  HGB 9.3* 9.9*  HCT 26.7* 28.1*  PLT 85* 115*   BMET  Recent Labs  08/28/12 0750 08/29/12 0540  NA 132* 132*  K 3.1* 3.6  CL 97 97  CO2 26 24  GLUCOSE 116* 112*  BUN 4* 5*  CREATININE 0.52 0.60  CALCIUM 9.1 9.3    Studies/Results: No results found.  Medications: I have reviewed the patient's current medications.  Assessment/Plan: This is a very pleasant 62 years old white male with metastatic non-small cell lung cancer recently treated with systemic chemotherapy with carboplatin and Abraxane for 4 cycles. He is currently off treatment secondary to  recent gangrene of the left third and cellulitis. I don't see any contraindication from oncology point of view for this patient to proceed with the revascularization surgery. I will arrange for the patient to have a followup appointment with me after his surgery and recovery for resuming his systemic therapy. Thank you for taking good care of [name]. I will continue to follow up the patient with you and assist in his management an as-needed basis.  LOS: 8 days    Koy Lamp K. 08/29/2012

## 2012-08-29 NOTE — Progress Notes (Signed)
[facility]                                                                                Patient Demographics  [name], is a 62 y.o. male, DOB - April 19, 1950, ZOX:[id number], WJX:[id number]  Admit date - 08/21/2012  Admitting Physician [name]  Outpatient Primary [name] for the patient is Frank Boga, [name]  LOS - 8   Chief Complaint  Patient presents with  . 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 - She at input from oncologist [name] who has no problem with proceeding with vascular surgery if needed   Of note patient and family clearly explained that he will be a moderate to high risk candidate for adverse cardiopulmonary outcome during perioperative period in the light of his history of lung cancer, COPD and age. Patient and family accept the risk and want to proceed for surgery if needed.    Hyponatremia  - sodium slowly trending up     Hypokalemia and hypomagnesemia  Replaced and stable   Leukocytosis  - Secondary to left foot cellulitis, osteo, and ischemic left toe  - WBC trending down and is within normal limits  - antibiotics as above    Acute blood loss anemia  - Possibly chemotherapy related,  - status post 2 units of PRBC with appropriate increase in Hg/Hct  - Hg  remains stable no indication for transfusion now.    Thrombocytopenia  - likely sequela of chemotherapy  - will have to monitor closely as pt in on Lovenox  - pending HIT panel     COPD, emphysema  - Clinically stable at this time, oxygen saturations at target range        Code Status:full  Family Communication: family bedside  Disposition Plan: Home   Procedures  Left lower extremity angiography: with failed removal of left SFA occlusion 08-27-12 Dr [name].   Echo  - Pt with limited windows. Views taken from subcostal and off axis subcostal views. - Left ventricle: The cavity size was normal. Wall thickness was normal. Systolic function was normal. The estimated ejection fraction was in the range of 55% to 60%. Wall motion was normal; there were no regional wall motion abnormalities. Doppler parameters are consistent with abnormal left ventricular relaxation (grade 1 diastolic dysfunction). The E/e' ratio is <10, suggesting normal LV filling pressure. - Left atrium: The atrium was normal in size. - Inferior vena cava: The vessel was normal in size; the respirophasic diameter  changes were in the normal range (= 50%); findings are consistent with normal central venous pressure.      Consults  Vas Surg, cards, ID, Oncology   DVT Prophylaxis  Lovenox   Lab Results  Component Value Date   PLT 115* 08/29/2012    Medications  Scheduled Meds: . aspirin EC  325 mg Oral Daily  . atorvastatin  20 mg Oral q1800  . ceFEPime (MAXIPIME) IV  2 g Intravenous Q8H  . feeding supplement  237 mL Oral BID BM  . gabapentin  600 mg Oral Daily  . lisinopril  20 mg Oral Daily  . metoprolol tartrate  12.5 mg Oral BID  . metroNIDAZOLE  500 mg Oral Q8H  . multivitamin with minerals  1 tablet Oral Daily  . nortriptyline  75 mg Oral QHS  . potassium chloride  40 mEq Oral BID  . tamsulosin  0.4 mg Oral Daily  . vancomycin  1,250 mg Intravenous Q12H   Continuous Infusions:   PRN Meds:.acetaminophen, budesonide-formoterol, HYDROmorphone (DILAUDID) injection, LORazepam, ondansetron (ZOFRAN) IV, oxyCODONE, oxyCODONE-acetaminophen, prochlorperazine, sodium chloride, traMADol  Antibiotics     Anti-infectives   Start     Dose/Rate Route Frequency Ordered Stop   08/28/12 2200  ceFEPIme (MAXIPIME) 2 g in dextrose 5 % 50 mL IVPB     2 g 100 mL/hr over 30 Minutes Intravenous 3 times per day 08/28/12 2042     08/28/12 2200  metroNIDAZOLE (FLAGYL) tablet 500 mg     500 mg Oral 3 times per day 08/28/12 2042     08/25/12 1000  vancomycin (VANCOCIN) 1,250 mg in sodium chloride 0.9 % 250 mL IVPB     1,250 mg 166.7 mL/hr over 90 Minutes Intravenous Every 12 hours 08/25/12 0505     08/23/12 1400  piperacillin-tazobactam (ZOSYN) IVPB 3.375 g  Status:  Discontinued     3.375 g 12.5 mL/hr over 240 Minutes Intravenous 3 times per day 08/23/12 0712 08/28/12 2042   08/23/12 0715  ceFAZolin (ANCEF) IVPB 2 g/50 mL premix  Status:  Discontinued     2 g 100 mL/hr over 30 Minutes Intravenous 3 times per day 08/23/12 0710 08/23/12 0712   08/22/12 1200  vancomycin (VANCOCIN) IVPB 1000 mg/200 mL premix  Status:  Discontinued     1,000 mg 200 mL/hr over 60 Minutes Intravenous Every 12 hours 08/22/12 0532 08/25/12 0508   08/22/12 0600  piperacillin-tazobactam (ZOSYN) IVPB 3.375 g  Status:  Discontinued     3.375 g 12.5 mL/hr over 240 Minutes Intravenous 3 times per day 08/22/12 0532 08/23/12 0710   08/22/12 0115  ciprofloxacin (CIPRO) IVPB 400 mg  Status:  Discontinued     400 mg 200 mL/hr over 60 Minutes Intravenous Every 12 hours 08/22/12 0113 08/22/12 1804   08/22/12 0030  vancomycin (VANCOCIN) IVPB 1000 mg/200 mL premix     1,000 mg 200 mL/hr over 60 Minutes Intravenous  Once 08/22/12 0018 08/22/12 0201   08/22/12 0030  piperacillin-tazobactam (ZOSYN) IVPB 3.375 g     3.375 g 100 mL/hr over 30 Minutes Intravenous  Once 08/22/12 0018 08/22/12 0100       Time Spent in minutes   35   [name] K M.D on 08/29/2012 at 12:32 PM  Between 7am to 7pm - Pager - [phone]  After 7pm go to [email or web address] - password [email or web address]  And look for the night coverage person covering for me after hours  [facility] Office  [phone]    Subjective:  [name] today has, No headache, No chest pain, No abdominal pain - No Nausea, No new weakness tingling or numbness, No Cough - SOB.    Objective:   Filed Vitals:   08/29/12 0025 08/29/12 0540 08/29/12 0751 08/29/12 1114  BP: 141/62 155/71 122/59 140/50  Pulse: 96 99 102 98  Temp: 97.9 F (36.6 C) 98.5 F (36.9 C) 98.3 F (36.8 C) 98.2 F (36.8 C)  TempSrc: Oral Oral Oral Oral  Resp: 17 20 18 19   Height:      Weight: 55.3 kg (121 lb 14.6 oz)     SpO2: 95% 98% 98% 93%    Wt Readings from Last 3 Encounters:  08/29/12 55.3 kg (121 lb 14.6 oz)  08/29/12 55.3 kg (121 lb 14.6 oz)  08/29/12 55.3 kg (121 lb 14.6 oz)     Intake/Output Summary (Last 24 hours) at 08/29/12 1232 Last data filed at 08/29/12 0900  Gross per 24 hour  Intake    480 ml  Output      3 ml  Net    477 ml    Exam Awake Alert, Oriented X 3, No new F.N deficits, Normal affect [place].AT,PERRAL Supple Neck,No JVD, No cervical lymphadenopathy appriciated.  Symmetrical Chest wall movement, Good air movement bilaterally, CTAB RRR,No Gallops,Rubs or new Murmurs, No Parasternal Heave +ve B.Sounds, Abd Soft, Non tender, No organomegaly appriciated, No rebound - guarding or rigidity. No Cyanosis, Clubbing or edema, No new Rash or bruise, L foot in bandage and splint   Data Review   Micro Results Recent Results (from the past 240 hour(s))  CULTURE, BLOOD (ROUTINE X 2)     Status: None   Collection Time    08/21/12 11:15 PM      Result Value Range Status   Specimen Description BLOOD RIGHT HAND   Final   Special Requests BOTTLES DRAWN AEROBIC AND ANAEROBIC   Final   Culture  Setup Time     Final   Value: 08/22/2012 04:30      Performed at [facility]   Culture     Final   Value: NO GROWTH 5 DAYS     Performed at [facility]   Report Status 08/28/2012 FINAL   Final  CULTURE, BLOOD (ROUTINE X 2)     Status: None   Collection Time    08/21/12 11:50 PM      Result Value Range Status   Specimen Description BLOOD LEFT HAND   Final   Special Requests BOTTLES DRAWN AEROBIC AND ANAEROBIC   Final   Culture  Setup Time     Final   Value: 08/22/2012 04:30     Performed at [facility]   Culture     Final   Value: NO GROWTH 5 DAYS     Performed at [facility]   Report Status 08/28/2012 FINAL   Final  SURGICAL PCR SCREEN     Status: None   Collection Time    08/26/12 10:18 AM      Result Value Range Status   MRSA, PCR NEGATIVE  NEGATIVE Final   Staphylococcus aureus NEGATIVE  NEGATIVE Final   Comment:            The Xpert SA Assay (FDA     approved for NASAL specimens     in patients over 68 years of age),     is one component of     a comprehensive surveillance  program.  Test performance has     been validated by [facility] for patients greater     than or equal to 67 year old.     It is not intended     to diagnose infection nor to     guide or monitor treatment.  TISSUE CULTURE     Status: None   Collection Time    08/26/12  5:54 PM      Result Value Range Status   Specimen Description FOOT LEFT   Final   Special Requests VANCOMYCIAN   Final   Gram Stain     Final   Value: NO WBC SEEN     NO SQUAMOUS EPITHELIAL CELLS SEEN     NO ORGANISMS SEEN     Performed at [facility]   Culture     Final   Value: NO GROWTH 2 DAYS     Performed at [facility]   Report Status PENDING   Incomplete  ANAEROBIC CULTURE     Status: None   Collection Time    08/26/12  5:54 PM      Result Value Range Status   Specimen Description FOOT LEFT   Final   Special Requests VANCOMYCIN   Final   Gram Stain     Final   Value: NO WBC SEEN     NO SQUAMOUS  EPITHELIAL CELLS SEEN     NO ORGANISMS SEEN     Performed at [facility]   Culture     Final   Value: NO ANAEROBES ISOLATED; CULTURE IN PROGRESS FOR 5 DAYS     Performed at [facility]   Report Status PENDING   Incomplete    Radiology Reports Dg Chest 1 View  08/21/2012   *RADIOLOGY REPORT*  Clinical Data: Fever  CHEST - 1 VIEW  Comparison: 07/08/2012, 06/14/2012  Findings: Hyperinflation noted compatible with background emphysema.  The bilateral pulmonary nodules are better demonstrated on the CT comparison.  Left mediastinal mass is not well appreciated by plain radiography.  No definite superimposed CHF or pneumonia.  No collapse or consolidation.  No significant effusion or pneumothorax.  Trachea is midline.  IMPRESSION: Stable COPD/emphysema.  Spiculated bilateral pulmonary nodules are better appreciated by CT comparison.  No superimposed acute process   Original Report Authenticated By: Judie Petit. Miles Costain, M.D.   Mr Foot Left Wo Contrast  08/24/2012   *RADIOLOGY REPORT*  Clinical Data: Left foot erythema and swelling.  Open wound along the distal plantar surface of the foot, query osteomyelitis.  MRI OF THE LEFT FOREFOOT WITHOUT CONTRAST  Technique:  Multiplanar, multisequence MR imaging was performed. No intravenous contrast was administered.  Comparison: 08/21/2012 radiographs  Findings: Reduced T1 and increased T2 signal in the head of the fifth metatarsal noted.  There is abnormal diffuse subcutaneous and muscular edema in the distal foot.  Confluent phlegmon noted dorsal to the fifth metatarsal head, potentially with very early abscess formation in this vicinity on image 20 of series 7- correlate with any fluctuance dorsal to the left fifth metatarsal head.  Lisfranc ligament intact.  No malalignment at the Lisfranc joint.  IMPRESSION:  1.  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 Dorsal phlegmon and possibly early dorsal abscess formation, correlate with any fluctuance  overlying the head of the fifth metatarsal. 2.  Diffuse subcutaneous and muscular edema suggesting cellulitis and myositis.   Original Report Authenticated By: Gaylyn Rong, M.D.  Dg Foot Complete Left  08/21/2012   *RADIOLOGY REPORT*  Clinical Data: Plantar ulceration left foot.  LEFT FOOT - COMPLETE 3+ VIEW  Comparison: Plain films left foot 08/16/2012.  Findings: No soft tissue gas collection or radiopaque foreign body is seen.  No bony destructive change is identified.  Small plantar calcaneal spur is noted.  Skin ulceration is not discretely visualized.  IMPRESSION: Negative exam.   Original Report Authenticated By: Holley Dexter, M.D.   Dg Foot Complete Left  08/16/2012   *RADIOLOGY REPORT*  Clinical Data: Soft tissue ulceration of the plantar aspect of the left foot.  LEFT FOOT - COMPLETE 3+ VIEW  Comparison: None.  Findings: Osseous structures of the foot appear normal.  There is no bone destruction, erosion, fracture, or periosteal reaction.  No arthritic changes.  IMPRESSION: Normal exam.  No evidence of osteomyelitis or other abnormality.   Original Report Authenticated By: Francene Boyers, M.D.    [facility]  Recent Labs Lab 08/26/12 5621 08/26/12 2040 08/27/12 0535 08/28/12 0750 08/29/12 0540  WBC 9.6 6.2 7.1 5.6 6.0  HGB 9.0* 9.3* 9.0* 9.3* 9.9*  HCT 26.0* 27.3* 26.6* 26.7* 28.1*  PLT 79* 83* 86* 85* 115*  MCV 90.9 91.9 91.7 90.5 90.6  MCH 31.5 31.3 31.0 31.5 31.9  MCHC 34.6 34.1 33.8 34.8 35.2  RDW 17.5* 17.4* 17.5* 17.1* 17.2*    Chemistries   Recent Labs Lab 08/26/12 0458 08/26/12 2040 08/27/12 0535 08/28/12 0750 08/29/12 0540  NA 128* 129* 130* 132* 132*  K 3.6 2.9* 3.3* 3.1* 3.6  CL 92* 96 95* 97 97  CO2 27 25 26 26 24   GLUCOSE 98 86 103* 116* 112*  BUN 6 7 7  4* 5*  CREATININE 0.53 0.50 0.50 0.52 0.60  CALCIUM 8.6 8.9 8.5 9.1 9.3    ------------------------------------------------------------------------------------------------------------------ estimated creatinine clearance is 73.9 ml/min (by C-G formula based on Cr of 0.6). ------------------------------------------------------------------------------------------------------------------ No results found for this basename: HGBA1C,  in the last 72 hours ------------------------------------------------------------------------------------------------------------------ No results found for this basename: CHOL, HDL, LDLCALC, TRIG, CHOLHDL, LDLDIRECT,  in the last 72 hours ------------------------------------------------------------------------------------------------------------------ No results found for this basename: TSH, T4TOTAL, FREET3, T3FREE, THYROIDAB,  in the last 72 hours ------------------------------------------------------------------------------------------------------------------ No results found for this basename: VITAMINB12, FOLATE, FERRITIN, TIBC, IRON, RETICCTPCT,  in the last 72 hours  Coagulation profile  Recent Labs Lab 08/26/12 2040  INR 1.13    No results found for this basename: DDIMER,  in the last 72 hours  Cardiac Enzymes No results found for this basename: CK, CKMB, TROPONINI, MYOGLOBIN,  in the last 168 hours ------------------------------------------------------------------------------------------------------------------ No components found with this basename: POCBNP,

## 2012-08-29 NOTE — Anesthesia Postprocedure Evaluation (Signed)
  Anesthesia Post-op Note  Patient: [name]  Procedure(s) Performed: Procedure(s) (LRB): IRRIGATION AND DEBRIDMENT LEFT DORSAL ABSCESS/ 4TH AND 5TH LEFT RAY AMPUTATION  (Left)  Patient Location: PACU  Anesthesia Type: General  Level of Consciousness: awake and alert   Airway and Oxygen Therapy: Patient Spontanous Breathing  Post-op Pain: mild  Post-op Assessment: Post-op Vital signs reviewed, Patient's Cardiovascular Status Stable, Respiratory Function Stable, Patent Airway and No signs of Nausea or vomiting  Last Vitals:  Filed Vitals:   08/29/12 0751  BP: 122/59  Pulse: 102  Temp: 36.8 C  Resp: 18    Post-op Vital Signs: stable   Complications: No apparent anesthesia complications

## 2012-08-29 NOTE — Progress Notes (Signed)
ANTIBIOTIC CONSULT NOTE - FOLLOW UP  Pharmacy Consult for Vancomycin Indication: Osteomyelitis  No Known Allergies  Patient Measurements: Height: 5\' 6"  (167.6 cm) Weight: 121 lb 14.6 oz (55.3 kg) IBW/kg (Calculated) : 63.8  Vital Signs: Temp: 98.2 F (36.8 C) (08/14 1114) Temp src: Oral (08/14 1114) BP: 140/50 mmHg (08/14 1114) Pulse Rate: 98 (08/14 1114) Intake/Output from previous day: 08/13 0701 - 08/14 0700 In: 240 [P.O.:240] Out: 3 [Urine:3] Intake/Output from this shift: Total I/O In: 480 [P.O.:480] Out: -   Labs:  Recent Labs  08/27/12 0535 08/28/12 0750 08/29/12 0540  WBC 7.1 5.6 6.0  HGB 9.0* 9.3* 9.9*  PLT 86* 85* 115*  CREATININE 0.50 0.52 0.60   Estimated Creatinine Clearance: 73.9 ml/min (by C-G formula based on Cr of 0.6).  Recent Labs  08/29/12 0900  VANCOTROUGH 19.4     Microbiology: Recent Results (from the past 720 hour(s))  CULTURE, BLOOD (ROUTINE X 2)     Status: None   Collection Time    08/21/12 11:15 PM      Result Value Range Status   Specimen Description BLOOD RIGHT HAND   Final   Special Requests BOTTLES DRAWN AEROBIC AND ANAEROBIC   Final   Culture  Setup Time     Final   Value: 08/22/2012 04:30     Performed at [facility]   Culture     Final   Value: NO GROWTH 5 DAYS     Performed at [facility]   Report Status 08/28/2012 FINAL   Final  CULTURE, BLOOD (ROUTINE X 2)     Status: None   Collection Time    08/21/12 11:50 PM      Result Value Range Status   Specimen Description BLOOD LEFT HAND   Final   Special Requests BOTTLES DRAWN AEROBIC AND ANAEROBIC   Final   Culture  Setup Time     Final   Value: 08/22/2012 04:30     Performed at [facility]   Culture     Final   Value: NO GROWTH 5 DAYS     Performed at [facility]   Report Status 08/28/2012 FINAL   Final  SURGICAL PCR SCREEN     Status: None   Collection Time    08/26/12 10:18 AM      Result Value Range Status   MRSA, PCR NEGATIVE  NEGATIVE Final   Staphylococcus aureus NEGATIVE  NEGATIVE Final   Comment:            The Xpert SA Assay (FDA     approved for NASAL specimens     in patients over 56 years of age),     is one component of     a comprehensive surveillance     program.  Test performance has     been validated by [facility] [employer] for patients greater     than or equal to 38 year old.     It is not intended     to diagnose infection nor to     guide or monitor treatment.  TISSUE CULTURE     Status: None   Collection Time    08/26/12  5:54 PM      Result Value Range Status   Specimen Description FOOT LEFT   Final   Special Requests VANCOMYCIAN   Final   Gram Stain     Final   Value: NO WBC SEEN  NO SQUAMOUS EPITHELIAL CELLS SEEN     NO ORGANISMS SEEN     Performed at [facility]   Culture     Final   Value: NO GROWTH 2 DAYS     Performed at [facility]   Report Status PENDING   Incomplete  ANAEROBIC CULTURE     Status: None   Collection Time    08/26/12  5:54 PM      Result Value Range Status   Specimen Description FOOT LEFT   Final   Special Requests VANCOMYCIN   Final   Gram Stain     Final   Value: NO WBC SEEN     NO SQUAMOUS EPITHELIAL CELLS SEEN     NO ORGANISMS SEEN     Performed at [facility]   Culture     Final   Value: NO ANAEROBES ISOLATED; CULTURE IN PROGRESS FOR 5 DAYS     Performed at [facility]   Report Status PENDING   Incomplete    Anti-infectives   Start     Dose/Rate Route Frequency Ordered Stop   08/29/12 0000  dextrose 5 % SOLN 50 mL with ceFEPIme 2 G SOLR 2 g     2 g 100 mL/hr over 30 Minutes Intravenous Every 8 hours 08/29/12 1310     08/29/12 0000  metroNIDAZOLE (FLAGYL) 500 MG tablet     500 mg Oral Every 8 hours 08/29/12 1310     08/29/12 0000  sodium chloride 0.9 % SOLN 250 mL with vancomycin 10 G SOLR 1,250 mg     1,250 mg 166.7 mL/hr over 90 Minutes Intravenous Every 12 hours 08/29/12 1310      08/28/12 2200  ceFEPIme (MAXIPIME) 2 g in dextrose 5 % 50 mL IVPB     2 g 100 mL/hr over 30 Minutes Intravenous 3 times per day 08/28/12 2042     08/28/12 2200  metroNIDAZOLE (FLAGYL) tablet 500 mg     500 mg Oral 3 times per day 08/28/12 2042     08/25/12 1000  vancomycin (VANCOCIN) 1,250 mg in sodium chloride 0.9 % 250 mL IVPB     1,250 mg 166.7 mL/hr over 90 Minutes Intravenous Every 12 hours 08/25/12 0505     08/23/12 1400  piperacillin-tazobactam (ZOSYN) IVPB 3.375 g  Status:  Discontinued     3.375 g 12.5 mL/hr over 240 Minutes Intravenous 3 times per day 08/23/12 0712 08/28/12 2042   08/23/12 0715  ceFAZolin (ANCEF) IVPB 2 g/50 mL premix  Status:  Discontinued     2 g 100 mL/hr over 30 Minutes Intravenous 3 times per day 08/23/12 0710 08/23/12 0712   08/22/12 1200  vancomycin (VANCOCIN) IVPB 1000 mg/200 mL premix  Status:  Discontinued     1,000 mg 200 mL/hr over 60 Minutes Intravenous Every 12 hours 08/22/12 0532 08/25/12 0508   08/22/12 0600  piperacillin-tazobactam (ZOSYN) IVPB 3.375 g  Status:  Discontinued     3.375 g 12.5 mL/hr over 240 Minutes Intravenous 3 times per day 08/22/12 0532 08/23/12 0710   08/22/12 0115  ciprofloxacin (CIPRO) IVPB 400 mg  Status:  Discontinued     400 mg 200 mL/hr over 60 Minutes Intravenous Every 12 hours 08/22/12 0113 08/22/12 1804   08/22/12 0030  vancomycin (VANCOCIN) IVPB 1000 mg/200 mL premix     1,000 mg 200 mL/hr over 60 Minutes Intravenous  Once 08/22/12 0018 08/22/12 0201   08/22/12 0030  piperacillin-tazobactam (ZOSYN) IVPB 3.375  g     3.375 g 100 mL/hr over 30 Minutes Intravenous  Once 08/22/12 0018 08/22/12 0100      Assessment: 62 YOM with metastatic non-small cell lung cancer recently treated with chemotherapy transferred from [facility] on 8/12, s/p 4th, 5th ray amputaion, I and D, and LE angiogram. Vancomycin is continued, ID consulted, changed zosyn to cefepime, and also add po flagyl. Per ID note on 8/13, will continue IV abx for  4-6 weeks post-op. Missed vancomycin AM dose on 8/12, vancomycin level (19.4) is high end therapeutic this morning after 3 doses since transfer to [facility]. Currently on 1250 mg IV Q 12hrs. Renal function has been stable, scr 0.6, est. crcl ~ 70 ml/min. plt has PICC line placed.    Goal of Therapy:  Vancomycin trough level 15-20 mcg/ml  Plan:  - Continue vancomycin 1250 mg IV Q 12hrs - Recommend checking vancomycin trough early next week to avoid accumulation.   Bayard Hugger, PharmD, BCPS  Clinical Pharmacist  Pager: [phone]   08/29/2012,2:16 PM

## 2012-08-29 NOTE — Progress Notes (Signed)
Patient discharge instructions give. Picc line capped off ready for use for home health RN. Wheelchair  delivered  And home health set up and prescriptions given to agency and family. Patient eager to leave no further questions from patient or family.

## 2012-08-29 NOTE — Discharge Summary (Addendum)
[facility]                                                                                   [name], is a 62 y.o. male  DOB July 12, 1950  MRN [id number].  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 SFA 06/30/11 (for ISR from 9/12)   PVD, s/p Rt SFA PTA/ stenting 10/13/11   Lung cancer- diagnosed March 2014- currently getting chemo   Hypokalemia   PAD- severe Lt lower extremity disease by PVA 08/27/12    Thrombocytopenia-(new this admission)      Past Medical History  Diagnosis Date  . BENIGN PROSTATIC HYPERTROPHY 12/24/2009  . CEREBROVASCULAR ACCIDENT, HX OF 12/24/2009  . HYPERLIPIDEMIA 12/24/2009  . HYPERTENSION 12/24/2009  . Testosterone deficiency   . ED (erectile dysfunction)   . Claudication in peripheral vascular disease, lifestyle limiting, with abnormal arterial dopplers of lower ext. 06/30/2011  . S/P angioplasty with stent to Lt. SFA 06/30/11 06/30/2011  . COPD 12/24/2009  . Shortness of breath     "from the COPD" (10/13/2011)  . Stroke 1996    "right side not as strong as left since; numbness R leg/foot" (10/13/2011)  . Pernicious anemia     Past Surgical History  Procedure Laterality Date  . Cervical fusion  1980's    "total of 3-4 OR's on my neck; pinched nerve" (10/13/2011)  . Aneurysm coiling  2007  . Peripheral arterial stent graft  06/30/2010; 10/13/10    left; left  . Video bronchoscopy Bilateral 04/03/2012    Procedure: VIDEO BRONCHOSCOPY WITHOUT FLUORO;  Surgeon: Nyoka Cowden, MD;  Location: [facility]  ENDOSCOPY;  Service: Cardiopulmonary;  Laterality: Bilateral;  . 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 SFA 06/30/11 (for ISR from 9/12)   PVD, s/p Rt SFA PTA/ stenting 10/13/11   Lung cancer- diagnosed March 2014- currently getting chemo   Hypokalemia   PAD- severe Lt lower extremity disease by PVA 08/27/12    Thrombocytopenia-(new this admission)    Discharge Condition: stable   Diet recommendation: See Discharge Instructions below   Consults  ID, orthopedics, vascular surgery, cardiology, oncology    History of present illness and  Hospital Course:     Kindly see H&P for history of present illness and admission details, please review complete Labs, Consult reports and Test reports for all details in brief [name], is a 62 y.o. male, patient with history of lung cancer undergoing chemotherapy by Dr. [name], CVA, PAD, COPD, hypertension, dyslipidemia, tobacco abuse, left foot ulcers who was admitted to the hospital for left foot cellulitis and left foot osteo- mellitus, he was seen by orthopedic surgery and underwent left fourth and fifth toe ray amputation and incision and drainage for a left foot abscess by Dr. Toni Arthurs. He also had evidence of post with relation to the left leg and underwent left leg angiogram with an unsuccessful attempt by Dr. Allyson Sabal to relieve the occlusion in the left SFA , he was  subsequently seen by vascular surgeon Dr. Myra Gianotti who will schedule an outpatient vascular surgery procedure to relieve the obstruction, patient was on Plavix and it was stopped on 08/27/2012. Vascular surgery was 4-5 days of From this time. He will be discharged with followup with his orthopedic surgeon and vascular surgeon. I have discussed his case with Dr. Algis Liming ID physician who recommends total 3 weeks of antibiotics, PICC line has been placed, antibiotic dosages will be monitored by outpatient pharmacy and ID.   Of note patient and family clearly explained that he will be a moderate to high risk candidate for adverse cardiopulmonary outcome during perioperative period in the light of his history of lung cancer, COPD and age. Patient and family accept the risk and want to proceed for surgery if needed. He will follow closely with his cardiologist post discharge.   Echo  - Pt with limited windows. Views taken from subcostal and off axis subcostal views. - Left ventricle: The cavity size was normal. Wall thickness was normal. Systolic function was normal. The estimated ejection fraction was in the range of 55% to 60%. Wall motion was normal; there were no regional wall motion abnormalities. Doppler parameters are consistent with abnormal left ventricular relaxation (grade 1 diastolic dysfunction). The E/e' ratio is <10, suggesting normal LV filling pressure. - Left atrium: The atrium was normal in size. - Inferior vena cava: The vessel was normal in size; the respirophasic diameter changes were in the normal range (= 50%); findings are consistent with normal central venous pressure.      Acute blood loss anemia  - Possibly chemotherapy related along with frequent blood draws in the hospital. - status post 2 units of PRBC with appropriate increase in Hg/Hct  - Hg remains stable no indication for transfusion now.     Thrombocytopenia  - likely sequela of chemotherapy  - We ordered an HIV  panel which is pending please monitor the results.     COPD, emphysema  - Clinically stable at this time, oxygen saturations at target range     H/O lung cancer he will follow with Dr. [name] post discharge and commence his chemotherapy as needed. We discussed his case with Dr. [name] who has suggested there are no contraindications for surgical procedure for his primary problem of left SFA occlusion.     Today   Subjective:   [name] today has no headache,no chest abdominal pain,no new weakness tingling or numbness, feels much better wants to go home today.    Objective:   Blood pressure 140/50, pulse 98, temperature 98.2 F (36.8 C), temperature source  Oral, resp. rate 19, height 5\' 6"  (1.676 m), weight 55.3 kg (121 lb 14.6 oz), SpO2 93.00%.   Intake/Output Summary (Last 24 hours) at 08/29/12 1320 Last data filed at 08/29/12 0900  Gross per 24 hour  Intake    480 ml  Output      3 ml  Net    477 ml    Exam Awake Alert, Oriented *3, No new F.N deficits, Normal affect [place].AT,PERRAL Supple Neck,No JVD, No cervical lymphadenopathy appriciated.  Symmetrical Chest wall movement, Good air movement bilaterally, CTAB RRR,No Gallops,Rubs or new Murmurs, No Parasternal Heave +ve B.Sounds, Abd Soft, Non tender, No organomegaly appriciated, No rebound -guarding or rigidity. No Cyanosis, Clubbing or edema, No new Rash or bruise, left foot in bandage and in splint  Data Review   Major procedures and Radiology Reports - PLEASE review detailed and final reports for all details, in brief -       Dg Chest 1 View  08/21/2012   *RADIOLOGY REPORT*  Clinical Data: Fever  CHEST - 1 VIEW  Comparison: 07/08/2012, 06/14/2012  Findings: Hyperinflation noted compatible with background emphysema.  The bilateral pulmonary nodules are better demonstrated on the CT comparison.  Left mediastinal mass is not well appreciated by plain radiography.  No definite superimposed CHF or pneumonia.   No collapse or consolidation.  No significant effusion or pneumothorax.  Trachea is midline.  IMPRESSION: Stable COPD/emphysema.  Spiculated bilateral pulmonary nodules are better appreciated by CT comparison.  No superimposed acute process   Original Report Authenticated By: Judie Petit. Miles Costain, M.D.   Mr Foot Left Wo Contrast  08/24/2012   *RADIOLOGY REPORT*  Clinical Data: Left foot erythema and swelling.  Open wound along the distal plantar surface of the foot, query osteomyelitis.  MRI OF THE LEFT FOREFOOT WITHOUT CONTRAST  Technique:  Multiplanar, multisequence MR imaging was performed. No intravenous contrast was administered.  Comparison: 08/21/2012 radiographs  Findings: Reduced T1 and increased T2 signal in the head of the fifth metatarsal noted.  There is abnormal diffuse subcutaneous and muscular edema in the distal foot.  Confluent phlegmon noted dorsal to the fifth metatarsal head, potentially with very early abscess formation in this vicinity on image 20 of series 7- correlate with any fluctuance dorsal to the left fifth metatarsal head.  Lisfranc ligament intact.  No malalignment at the Lisfranc joint.  IMPRESSION:  1.  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 Dorsal phlegmon and possibly early dorsal abscess formation, correlate with any fluctuance overlying the head of the fifth metatarsal. 2.  Diffuse subcutaneous and muscular edema suggesting cellulitis and myositis.   Original Report Authenticated By: Gaylyn Rong, M.D.   Dg Foot Complete Left  08/21/2012   *RADIOLOGY REPORT*  Clinical Data: Plantar ulceration left foot.  LEFT FOOT - COMPLETE 3+ VIEW  Comparison: Plain films left foot 08/16/2012.  Findings: No soft tissue gas collection or radiopaque foreign body is seen.  No bony destructive change is identified.  Small plantar calcaneal spur is noted.  Skin ulceration is not discretely visualized.  IMPRESSION: Negative exam.   Original Report Authenticated By: Holley Dexter,  M.D.   Dg Foot Complete Left  08/16/2012   *RADIOLOGY REPORT*  Clinical Data: Soft tissue ulceration of the plantar aspect of the left foot.  LEFT FOOT - COMPLETE 3+ VIEW  Comparison: None.  Findings: Osseous structures of the foot appear normal.  There is no bone destruction, erosion, fracture, or periosteal reaction.  No arthritic changes.  IMPRESSION:  Normal exam.  No evidence of osteomyelitis or other abnormality.   Original Report Authenticated By: Francene Boyers, M.D.    Micro Results      Recent Results (from the past 240 hour(s))  CULTURE, BLOOD (ROUTINE X 2)     Status: None   Collection Time    08/21/12 11:15 PM      Result Value Range Status   Specimen Description BLOOD RIGHT HAND   Final   Special Requests BOTTLES DRAWN AEROBIC AND ANAEROBIC   Final   Culture  Setup Time     Final   Value: 08/22/2012 04:30     Performed at [facility]   Culture     Final   Value: NO GROWTH 5 DAYS     Performed at [facility]   Report Status 08/28/2012 FINAL   Final  CULTURE, BLOOD (ROUTINE X 2)     Status: None   Collection Time    08/21/12 11:50 PM      Result Value Range Status   Specimen Description BLOOD LEFT HAND   Final   Special Requests BOTTLES DRAWN AEROBIC AND ANAEROBIC   Final   Culture  Setup Time     Final   Value: 08/22/2012 04:30     Performed at [facility]   Culture     Final   Value: NO GROWTH 5 DAYS     Performed at [facility]   Report Status 08/28/2012 FINAL   Final  SURGICAL PCR SCREEN     Status: None   Collection Time    08/26/12 10:18 AM      Result Value Range Status   MRSA, PCR NEGATIVE  NEGATIVE Final   Staphylococcus aureus NEGATIVE  NEGATIVE Final   Comment:            The Xpert SA Assay (FDA     approved for NASAL specimens     in patients over 73 years of age),     is one component of     a comprehensive surveillance     program.  Test performance has     been validated by [facility] for  patients greater     than or equal to 90 year old.     It is not intended     to diagnose infection nor to     guide or monitor treatment.  TISSUE CULTURE     Status: None   Collection Time    08/26/12  5:54 PM      Result Value Range Status   Specimen Description FOOT LEFT   Final   Special Requests VANCOMYCIAN   Final   Gram Stain     Final   Value: NO WBC SEEN     NO SQUAMOUS EPITHELIAL CELLS SEEN     NO ORGANISMS SEEN     Performed at [facility]   Culture     Final   Value: NO GROWTH 2 DAYS     Performed at [facility]   Report Status PENDING   Incomplete  ANAEROBIC CULTURE     Status: None   Collection Time    08/26/12  5:54 PM      Result Value Range Status   Specimen Description FOOT LEFT   Final   Special Requests VANCOMYCIN   Final   Gram Stain     Final   Value: NO WBC SEEN  NO SQUAMOUS EPITHELIAL CELLS SEEN     NO ORGANISMS SEEN     Performed at [facility]   Culture     Final   Value: NO ANAEROBES ISOLATED; CULTURE IN PROGRESS FOR 5 DAYS     Performed at [facility]   Report Status PENDING   Incomplete     CBC w Diff: Lab Results  Component Value Date   WBC 6.0 08/29/2012   WBC 2.7 Repeated and Verified* 08/15/2012   HGB 9.9* 08/29/2012   HGB 8.3 Repeated and Verified* 08/15/2012   HCT 28.1* 08/29/2012   HCT 24.2* 08/15/2012   PLT 115* 08/29/2012   PLT 187 08/15/2012   LYMPHOPCT 12 08/21/2012   LYMPHOPCT 62.0* 08/15/2012   MONOPCT 14* 08/21/2012   MONOPCT 24.2* 08/15/2012   EOSPCT 0 08/21/2012   EOSPCT 1.5 08/15/2012   BASOPCT 0 08/21/2012   BASOPCT 0.8 08/15/2012    CMP: Lab Results  Component Value Date   NA 132* 08/29/2012   NA 135* 08/15/2012   K 3.6 08/29/2012   K 3.4* 08/15/2012   CL 97 08/29/2012   CL 97* 06/13/2012   CO2 24 08/29/2012   CO2 25 08/15/2012   BUN 5* 08/29/2012   BUN 7.1 08/15/2012   CREATININE 0.60 08/29/2012   CREATININE 0.7 08/15/2012   PROT 6.8 08/21/2012   PROT 7.0 08/15/2012   ALBUMIN 2.5* 08/21/2012    ALBUMIN 2.9* 08/15/2012   BILITOT 0.3 08/21/2012   BILITOT 0.24 08/15/2012   ALKPHOS 128* 08/21/2012   ALKPHOS 134 08/15/2012   AST 9 08/21/2012   AST 11 08/15/2012   ALT 8 08/21/2012   ALT 6 08/15/2012  .   Discharge Instructions     Follow with Primary MD [name], MD in 4 days , keep your left foot dry and clean at all times  Get CBC, CMP, checked 4 days by Primary MD and again as instructed by your Primary MD.    Get Medicines reviewed and adjusted.  Please request your Prim.MD to go over all Hospital Tests and Procedure/Radiological results at the follow up, please get all Hospital records sent to your Prim MD by signing hospital release before you go home.  Activity: As tolerated with Full fall precautions use walker/cane & assistance as needed   Diet:  Heart Healthy   For Heart failure patients - Check your Weight same time everyday, if you gain over 2 pounds, or you develop in leg swelling, experience more shortness of breath or chest pain, call your Primary MD immediately. Follow Cardiac Low Salt Diet and 1.8 lit/day fluid restriction.  Disposition Home **  If you experience worsening of your admission symptoms, develop shortness of breath, life threatening emergency, suicidal or homicidal thoughts you must seek medical attention immediately by calling 911 or calling your MD immediately  if symptoms less severe.  You Must read complete instructions/literature along with all the possible adverse reactions/side effects for all the Medicines you take and that have been prescribed to you. Take any new Medicines after you have completely understood and accpet all the possible adverse reactions/side effects.   Do not drive and provide baby sitting services if your were admitted for syncope or siezures until you have seen by Primary MD or a Neurologist and advised to do so again.  Do not drive when taking Pain medications.    Do not take more than prescribed Pain, Sleep  and Anxiety Medications  Special Instructions: If you have smoked  or chewed Tobacco  in the last 2 yrs please stop smoking, stop any regular Alcohol  and or any Recreational drug use.  Wear Seat belts while driving.   Please note  You were cared for by a hospitalist during your hospital stay. If you have any questions about your discharge medications or the care you received while you were in the hospital after you are discharged, you can call the unit and asked to speak with the hospitalist on call if the hospitalist that took care of you is not available. Once you are discharged, your primary care physician will handle any further medical issues. Please note that NO REFILLS for any discharge medications will be authorized once you are discharged, as it is imperative that you return to your primary care physician (or establish a relationship with a primary care physician if you do not have one) for your aftercare needs so that they can reassess your need for medications and monitor your lab values.       Follow-up Information   Follow up with [name], MD. Schedule an appointment as soon as possible for a visit in 4 days.   Specialty:  Internal Medicine   Contact information:   [address] [place] [address] [phone]       Follow up with Lajuana Matte., MD. Schedule an appointment as soon as possible for a visit in 2 weeks.   Specialty:  Oncology   Contact information:   [address] [place] [address] [phone]       Follow up with Acey Lav, MD. Schedule an appointment as soon as possible for a visit in 1 week.   Specialty:  Infectious Diseases   Contact information:   [address] [place] [address] [phone]       Follow up with Runell Gess, MD. Schedule an appointment as soon as possible for a visit in 4 days.   Specialty:  Cardiology   Contact information:   [address] [place] [address] [phone]       Follow up with Basilio Cairo, Lala Lund, MD. Schedule an appointment as soon as possible for a visit in 4 days.   Specialty:  Vascular Surgery   Contact information:   [address] [place] [address] [phone]       Follow up with Toni Arthurs, MD. Schedule an appointment as soon as possible for a visit in 1 week.   Specialty:  Orthopedic Surgery   Contact information:   [address] [place] [address] [phone]         Discharge Medications     Medication List    STOP taking these medications       clopidogrel 75 MG tablet  Commonly known as:  PLAVIX     doxycycline 100 MG tablet  Commonly known as:  VIBRA-TABS      TAKE these medications       acetaminophen 500 MG tablet  Commonly known as:  TYLENOL  Take 1,000 mg by mouth every 6 (six) hours as needed for pain.     aspirin 81 MG tablet  Take 81 mg by mouth daily.     atorvastatin 20 MG tablet  Commonly known as:  LIPITOR  Take 20 mg by mouth daily.     budesonide-formoterol 160-4.5 MCG/ACT inhaler  Commonly known as:  SYMBICORT  Inhale 2 puffs into the lungs 2 (two)  times daily as needed (for shortness of breath).     dextrose 5 % SOLN 50 mL with ceFEPIme 2 G SOLR 2 g  Inject 2 g into the vein every 8 (eight) hours.     feeding supplement Liqd  Take 237 mL by mouth 2 (two) times daily between meals.     gabapentin 600 MG tablet  Commonly known as:  NEURONTIN  Take 600 mg by mouth every morning.     lisinopril 20 MG tablet  Commonly known as:  PRINIVIL,ZESTRIL  Take 20 mg by mouth daily.     metroNIDAZOLE 500 MG tablet  Commonly known as:  FLAGYL  Take 1 tablet (500 mg total) by mouth every 8 (eight) hours.     mupirocin cream 2 %  Commonly known as:  BACTROBAN  Apply topically daily. Onto left plantar wound     nortriptyline 75 MG capsule  Commonly known as:  PAMELOR  Take 75 mg by mouth at bedtime.      oxyCODONE-acetaminophen 5-325 MG per tablet  Commonly known as:  PERCOCET/ROXICET  Take 1 tablet by mouth every 8 (eight) hours as needed.     prochlorperazine 10 MG tablet  Commonly known as:  COMPAZINE  Take 10 mg by mouth every 6 (six) hours as needed (for nausea).     sodium chloride 0.9 % SOLN 250 mL with vancomycin 10 G SOLR 1,250 mg  Inject 1,250 mg into the vein every 12 (twelve) hours. Dose to be monitored by outpatient pharmacy and ID physician, total 21 day course     tamsulosin 0.4 MG Caps capsule  Commonly known as:  FLOMAX  Take 0.4 mg by mouth daily.     traMADol 50 MG tablet  Commonly known as:  ULTRAM  Take 100 mg by mouth every 6 (six) hours as needed for pain.           Total Time in preparing paper work, data evaluation and todays exam - 35 minutes  Leroy Sea M.D on 08/29/2012 at 1:20 PM  [facility] Office  [phone]

## 2012-08-29 NOTE — Progress Notes (Signed)
[facility]    Subjective: No new complaints   Antibiotics:  Anti-infectives   Start     Dose/Rate Route Frequency Ordered Stop   08/29/12 0000  dextrose 5 % SOLN 50 mL with ceFEPIme 2 G SOLR 2 g     2 g 100 mL/hr over 30 Minutes Intravenous Every 8 hours 08/29/12 1310     08/29/12 0000  metroNIDAZOLE (FLAGYL) 500 MG tablet     500 mg Oral Every 8 hours 08/29/12 1310     08/29/12 0000  sodium chloride 0.9 % SOLN 250 mL with vancomycin 10 G SOLR 1,250 mg     1,250 mg 166.7 mL/hr over 90 Minutes Intravenous Every 12 hours 08/29/12 1310     08/28/12 2200  ceFEPIme (MAXIPIME) 2 g in dextrose 5 % 50 mL IVPB     2 g 100 mL/hr over 30 Minutes Intravenous 3 times per day 08/28/12 2042     08/28/12 2200  metroNIDAZOLE (FLAGYL) tablet 500 mg     500 mg Oral 3 times per day 08/28/12 2042     08/25/12 1000  vancomycin (VANCOCIN) 1,250 mg in sodium chloride 0.9 % 250 mL IVPB     1,250 mg 166.7 mL/hr over 90 Minutes Intravenous Every 12 hours 08/25/12 0505     08/23/12 1400  piperacillin-tazobactam (ZOSYN) IVPB 3.375 g  Status:  Discontinued     3.375 g 12.5 mL/hr over 240 Minutes Intravenous 3 times per day 08/23/12 0712 08/28/12 2042   08/23/12 0715  ceFAZolin (ANCEF) IVPB 2 g/50 mL premix  Status:  Discontinued     2 g 100 mL/hr over 30 Minutes Intravenous 3 times per day 08/23/12 0710 08/23/12 0712   08/22/12 1200  vancomycin (VANCOCIN) IVPB 1000 mg/200 mL premix  Status:  Discontinued     1,000 mg 200 mL/hr over 60 Minutes Intravenous Every 12 hours 08/22/12 0532 08/25/12 0508   08/22/12 0600  piperacillin-tazobactam (ZOSYN) IVPB 3.375 g  Status:  Discontinued     3.375 g 12.5 mL/hr over 240 Minutes Intravenous 3 times per day 08/22/12 0532 08/23/12 0710   08/22/12 0115  ciprofloxacin (CIPRO) IVPB 400 mg  Status:  Discontinued     400 mg 200 mL/hr over 60 Minutes Intravenous Every 12 hours 08/22/12 0113 08/22/12 1804   08/22/12 0030  vancomycin  (VANCOCIN) IVPB 1000 mg/200 mL premix     1,000 mg 200 mL/hr over 60 Minutes Intravenous  Once 08/22/12 0018 08/22/12 0201   08/22/12 0030  piperacillin-tazobactam (ZOSYN) IVPB 3.375 g     3.375 g 100 mL/hr over 30 Minutes Intravenous  Once 08/22/12 0018 08/22/12 0100      Medications: Scheduled Meds: . aspirin EC  325 mg Oral Daily  . atorvastatin  20 mg Oral q1800  . ceFEPime (MAXIPIME) IV  2 g Intravenous Q8H  . enoxaparin (LOVENOX) injection  40 mg Subcutaneous Q24H  . feeding supplement  237 mL Oral BID BM  . gabapentin  600 mg Oral Daily  . lisinopril  20 mg Oral Daily  . metoprolol tartrate  12.5 mg Oral BID  . metroNIDAZOLE  500 mg Oral Q8H  . multivitamin with minerals  1 tablet Oral Daily  . nortriptyline  75 mg Oral QHS  . [START ON 08/30/2012] potassium chloride  40 mEq Oral Daily  . tamsulosin  0.4 mg Oral Daily  . vancomycin  1,250 mg Intravenous Q12H   Continuous Infusions:  PRN Meds:.acetaminophen, budesonide-formoterol, HYDROmorphone (  DILAUDID) injection, LORazepam, ondansetron (ZOFRAN) IV, oxyCODONE, oxyCODONE-acetaminophen, prochlorperazine, sodium chloride, traMADol   Objective: Weight change: -7 lb 0.9 oz (-3.2 kg)  Intake/Output Summary (Last 24 hours) at 08/29/12 1426 Last data filed at 08/29/12 1300  Gross per 24 hour  Intake    720 ml  Output      3 ml  Net    717 ml   Blood pressure 140/50, pulse 98, temperature 98.2 F (36.8 C), temperature source Oral, resp. rate 19, height 5\' 6"  (1.676 m), weight 121 lb 14.6 oz (55.3 kg), SpO2 93.00%. Temp:  [97.9 F (36.6 C)-98.5 F (36.9 C)] 98.2 F (36.8 C) (08/14 1114) Pulse Rate:  [95-103] 98 (08/14 1114) Resp:  [15-20] 19 (08/14 1114) BP: (122-155)/(50-71) 140/50 mmHg (08/14 1114) SpO2:  [93 %-98 %] 93 % (08/14 1114) Weight:  [121 lb 14.6 oz (55.3 kg)] 121 lb 14.6 oz (55.3 kg) (08/14 0025)  Physical Exam: General: Alert and awake, oriented x3, not in any acute distress. HEENT: anicteric sclera,  EOMI CVS regular rate, normal r,  no murmur rubs or gallops Chest: clear to auscultation bilaterally, no wheezing, rales or rhonchi Abdomen: softnondistended, normal bowel sounds, Extremities: right foot bandaged  Neuro: nonfocal  Lab Results:  Recent Labs  08/28/12 0750 08/29/12 0540  WBC 5.6 6.0  HGB 9.3* 9.9*  HCT 26.7* 28.1*  PLT 85* 115*    BMET  Recent Labs  08/28/12 0750 08/29/12 0540  NA 132* 132*  K 3.1* 3.6  CL 97 97  CO2 26 24  GLUCOSE 116* 112*  BUN 4* 5*  CREATININE 0.52 0.60  CALCIUM 9.1 9.3    Micro Results: Recent Results (from the past 240 hour(s))  CULTURE, BLOOD (ROUTINE X 2)     Status: None   Collection Time    08/21/12 11:15 PM      Result Value Range Status   Specimen Description BLOOD RIGHT HAND   Final   Special Requests BOTTLES DRAWN AEROBIC AND ANAEROBIC   Final   Culture  Setup Time     Final   Value: 08/22/2012 04:30     Performed at [facility]   Culture     Final   Value: NO GROWTH 5 DAYS     Performed at [facility]   Report Status 08/28/2012 FINAL   Final  CULTURE, BLOOD (ROUTINE X 2)     Status: None   Collection Time    08/21/12 11:50 PM      Result Value Range Status   Specimen Description BLOOD LEFT HAND   Final   Special Requests BOTTLES DRAWN AEROBIC AND ANAEROBIC   Final   Culture  Setup Time     Final   Value: 08/22/2012 04:30     Performed at [facility]   Culture     Final   Value: NO GROWTH 5 DAYS     Performed at [facility]   Report Status 08/28/2012 FINAL   Final  SURGICAL PCR SCREEN     Status: None   Collection Time    08/26/12 10:18 AM      Result Value Range Status   MRSA, PCR NEGATIVE  NEGATIVE Final   Staphylococcus aureus NEGATIVE  NEGATIVE Final   Comment:            The Xpert SA Assay (FDA     approved for NASAL specimens     in patients over 40 years of age),  is one component of     a comprehensive surveillance     program.  Test  performance has     been validated by [facility] for patients greater     than or equal to 42 year old.     It is not intended     to diagnose infection nor to     guide or monitor treatment.  TISSUE CULTURE     Status: None   Collection Time    08/26/12  5:54 PM      Result Value Range Status   Specimen Description FOOT LEFT   Final   Special Requests VANCOMYCIAN   Final   Gram Stain     Final   Value: NO WBC SEEN     NO SQUAMOUS EPITHELIAL CELLS SEEN     NO ORGANISMS SEEN     Performed at [facility]   Culture     Final   Value: NO GROWTH 2 DAYS     Performed at [facility]   Report Status PENDING   Incomplete  ANAEROBIC CULTURE     Status: None   Collection Time    08/26/12  5:54 PM      Result Value Range Status   Specimen Description FOOT LEFT   Final   Special Requests VANCOMYCIN   Final   Gram Stain     Final   Value: NO WBC SEEN     NO SQUAMOUS EPITHELIAL CELLS SEEN     NO ORGANISMS SEEN     Performed at [facility]   Culture     Final   Value: NO ANAEROBES ISOLATED; CULTURE IN PROGRESS FOR 5 DAYS     Performed at [facility]   Report Status PENDING   Incomplete    Studies/Results: No results found.    Assessment/Plan: [name] is a 62 y.o. 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

## 2012-08-29 NOTE — Progress Notes (Signed)
Peripherally Inserted Central Catheter/Midline Placement  The IV Nurse has discussed with the patient and/or persons authorized to consent for the patient, the purpose of this procedure and the potential benefits and risks involved with this procedure.  The benefits include less needle sticks, lab draws from the catheter and patient may be discharged home with the catheter.  Risks include, but not limited to, infection, bleeding, blood clot (thrombus formation), and puncture of an artery; nerve damage and irregular heat beat.  Alternatives to this procedure were also discussed.  PICC/Midline Placement Documentation        [name] 08/29/2012, 11:24 AM

## 2012-08-29 NOTE — Progress Notes (Signed)
08/29/12 1030 Falen Lehrmann, RN, BSN, [employer] [phone] Spoke with pt and spouse at bedside regarding discharge planning for Home Health services.  Offered pt and spouse list of HH agencies.  Pt chose [facility] to render services of RN, PT and Nurse Aide.  Pt also to recieve PICC line for home antibiotic therapy.  Kizzie Furnish of [facility] notified.  PT and family identified wheelchair and walker as DME need.  Pt qualifies for one or other; pt chose wheelchair.  [place] of [facility] notified.

## 2012-08-30 ENCOUNTER — Encounter (HOSPITAL_COMMUNITY): Payer: Self-pay | Admitting: Pharmacy Technician

## 2012-08-30 ENCOUNTER — Other Ambulatory Visit: Payer: Self-pay | Admitting: *Deleted

## 2012-08-30 ENCOUNTER — Telehealth: Payer: Self-pay | Admitting: Internal Medicine

## 2012-08-30 LAB — HEPARIN INDUCED THROMBOCYTOPENIA PNL
UFH High Dose UFH H: 0 % Release
UFH Low Dose 0.5 IU/mL: 0 % Release
UFH SRA Result: NEGATIVE

## 2012-08-30 LAB — TISSUE CULTURE

## 2012-08-30 LAB — ANAEROBIC CULTURE: Gram Stain: NONE SEEN

## 2012-08-30 NOTE — Progress Notes (Signed)
Anesthesia Chart Review:  Patient is a 62 year old male scheduled for left FPBG on 09/03/12 by Dr. Myra Gianotti.  Currently, he is scheduled to be a same day work-up.    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    He had a recent oncology and cardiology evaluation.  Oncologist Dr. [name] stated on 08/29/12, "I don't see any contraindication from oncology point of view for this patient to proceed with the revascularization surgery."  Cardiologist Dr. Tresa Endo also felt patient was OK for surgery from a cardiology standpoint.    EKG on 08/25/12 showed ST @ 108 bpm, non-specific ST-T wave changes.  Echocardiogram on 08/28/12 showed: - Pt with limited windows. Views taken from subcostal and off axis subcostal views. - Left ventricle: The cavity size was normal. Wall thickness was normal. Systolic function was normal. The estimated ejection fraction was in the range of 55% to 60%. Wall motion was normal; there were no regional wall motion abnormalities. Doppler parameters are consistent with abnormal left ventricular relaxation (grade 1 diastolic dysfunction). The E/e' ratio is <10, suggesting normal LV filling pressure. - Mitral valve: Trivial regurgitation. - Left atrium: The atrium was normal in size. - Inferior vena cava: The vessel was normal in size; the respirophasic diameter changes were in the normal range (= 50%); findings are consistent with normal central venous pressure.  Nuclear stress test on 08/11/10 showed diaphragmatic attenuation, no scintigraphic evidence of inducible myocardial ischemia, Ef 67%, normal LV systolic function.  1V CXR on 08/21/12 showed: Stable COPD/emphysema. Spiculated bilateral pulmonary nodules are better appreciated by CT comparison. No superimposed acute  process.  He also has a chest CT from 07/08/12.  Labs from 08/29/12 noted.  PLT up to 115K.  H/H 9.9/28.1 (up from 9.3/26.7).  He will need a T&S on arrival.  Velna Ochs [facility] Short Stay Center/Anesthesiology Phone [phone] 08/30/2012 6:08 PM

## 2012-08-30 NOTE — Telephone Encounter (Signed)
Patient Information:  Caller Name: [name]  Phone: [phone]  Patient: Lovett, Coffin  Gender: Male  DOB: 1950/07/09  Age: 62 Years  PCP: Eleonore Chiquito (Family Practice > 35yrs old)  Office Follow Up:  Does the office need to follow up with this patient?: No  Instructions For The Office: N/A  RN Note:  Wife advised to contact surgeon and advice of pain level with current medication for pain not holding him especially after dressing by home health.  Provider should know and may adjust medication.  He would be the one to decide if appropriate to add Ultram. Caller demonstrated her understanding and will follow up with surgeon.  Symptoms  Reason For Call & Symptoms: Discharged yesterday from hospital 08/29/12 with 2 toes amputated.  Took pain pills at 06:50 AM but is increased pain from dressing being changed this morning by home health.  Patient calls wanting to know if she may add Ultram to his pain regime.  Reviewed Health History In EMR: N/A  Reviewed Medications In EMR: N/A  Reviewed Allergies In EMR: N/A  Reviewed Surgeries / Procedures: N/A  Date of Onset of Symptoms: 08/30/2012  Guideline(s) Used:  No Protocol Available - Information Only  Disposition Per Guideline:   Home Care  Reason For Disposition Reached:   Information only question and nurse able to answer  Advice Given:  Call Back If:  New symptoms develop  You become worse.  Patient Will Follow Care Advice:  YES

## 2012-09-01 DIAGNOSIS — D7582 Heparin induced thrombocytopenia (HIT): Secondary | ICD-10-CM | POA: Diagnosis not present

## 2012-09-02 ENCOUNTER — Encounter (HOSPITAL_COMMUNITY): Payer: Self-pay | Admitting: *Deleted

## 2012-09-02 MED ORDER — DEXTROSE 5 % IV SOLN
1.5000 g | INTRAVENOUS | Status: AC
  Administered 2012-09-03 (×2): 1.5 g via INTRAVENOUS
  Filled 2012-09-02: qty 1.5

## 2012-09-02 MED ORDER — SODIUM CHLORIDE 0.9 % IV SOLN: INTRAVENOUS | Status: DC

## 2012-09-03 ENCOUNTER — Inpatient Hospital Stay (HOSPITAL_COMMUNITY): Payer: Medicare Other

## 2012-09-03 ENCOUNTER — Encounter (HOSPITAL_COMMUNITY)
Admission: RE | Disposition: A | Discharge status: Home / Self Care | Payer: Self-pay | Source: Ambulatory Visit | Attending: Surgery

## 2012-09-03 ENCOUNTER — Encounter (HOSPITAL_COMMUNITY): Payer: Self-pay | Admitting: Vascular Surgery

## 2012-09-03 ENCOUNTER — Encounter (HOSPITAL_COMMUNITY): Payer: Self-pay | Admitting: *Deleted

## 2012-09-03 ENCOUNTER — Inpatient Hospital Stay (HOSPITAL_COMMUNITY): Payer: Medicare Other | Admitting: Vascular Surgery

## 2012-09-03 ENCOUNTER — Inpatient Hospital Stay (HOSPITAL_COMMUNITY)
Admission: RE | Admit: 2012-09-03 | Discharge: 2012-09-06 | DRG: 253 | Disposition: A | Discharge status: Home / Self Care | Payer: Medicare Other | Source: Ambulatory Visit | Attending: Surgery | Admitting: Surgery

## 2012-09-03 DIAGNOSIS — I739 Peripheral vascular disease, unspecified: Secondary | ICD-10-CM

## 2012-09-03 DIAGNOSIS — I10 Essential (primary) hypertension: Secondary | ICD-10-CM | POA: Diagnosis present

## 2012-09-03 DIAGNOSIS — E785 Hyperlipidemia, unspecified: Secondary | ICD-10-CM | POA: Diagnosis present

## 2012-09-03 DIAGNOSIS — F17200 Nicotine dependence, unspecified, uncomplicated: Secondary | ICD-10-CM | POA: Diagnosis present

## 2012-09-03 DIAGNOSIS — I70219 Atherosclerosis of native arteries of extremities with intermittent claudication, unspecified extremity: Secondary | ICD-10-CM

## 2012-09-03 DIAGNOSIS — D62 Acute posthemorrhagic anemia: Secondary | ICD-10-CM | POA: Diagnosis not present

## 2012-09-03 DIAGNOSIS — E876 Hypokalemia: Secondary | ICD-10-CM | POA: Diagnosis not present

## 2012-09-03 DIAGNOSIS — Z95820 Peripheral vascular angioplasty status with implants and grafts: Secondary | ICD-10-CM

## 2012-09-03 DIAGNOSIS — Z9221 Personal history of antineoplastic chemotherapy: Secondary | ICD-10-CM

## 2012-09-03 DIAGNOSIS — I69998 Other sequelae following unspecified cerebrovascular disease: Secondary | ICD-10-CM

## 2012-09-03 DIAGNOSIS — N400 Enlarged prostate without lower urinary tract symptoms: Secondary | ICD-10-CM | POA: Diagnosis present

## 2012-09-03 DIAGNOSIS — I7092 Chronic total occlusion of artery of the extremities: Secondary | ICD-10-CM | POA: Diagnosis present

## 2012-09-03 DIAGNOSIS — S98139A Complete traumatic amputation of one unspecified lesser toe, initial encounter: Secondary | ICD-10-CM

## 2012-09-03 DIAGNOSIS — C801 Malignant (primary) neoplasm, unspecified: Secondary | ICD-10-CM | POA: Diagnosis present

## 2012-09-03 DIAGNOSIS — I70269 Atherosclerosis of native arteries of extremities with gangrene, unspecified extremity: Principal | ICD-10-CM | POA: Diagnosis present

## 2012-09-03 DIAGNOSIS — J449 Chronic obstructive pulmonary disease, unspecified: Secondary | ICD-10-CM | POA: Diagnosis present

## 2012-09-03 DIAGNOSIS — J4489 Other specified chronic obstructive pulmonary disease: Secondary | ICD-10-CM | POA: Diagnosis present

## 2012-09-03 DIAGNOSIS — E7800 Pure hypercholesterolemia, unspecified: Secondary | ICD-10-CM | POA: Diagnosis present

## 2012-09-03 DIAGNOSIS — C3490 Malignant neoplasm of unspecified part of unspecified bronchus or lung: Secondary | ICD-10-CM | POA: Diagnosis present

## 2012-09-03 DIAGNOSIS — I96 Gangrene, not elsewhere classified: Secondary | ICD-10-CM

## 2012-09-03 DIAGNOSIS — L97509 Non-pressure chronic ulcer of other part of unspecified foot with unspecified severity: Secondary | ICD-10-CM | POA: Diagnosis present

## 2012-09-03 DIAGNOSIS — Z720 Tobacco use: Secondary | ICD-10-CM

## 2012-09-03 HISTORY — DX: Malignant neoplasm of unspecified part of unspecified bronchus or lung: C34.90

## 2012-09-03 HISTORY — DX: Adverse effect of unspecified anesthetic, initial encounter: T41.45XA

## 2012-09-03 HISTORY — DX: Other complications of anesthesia, initial encounter: T88.59XA

## 2012-09-03 HISTORY — PX: FEMORAL-POPLITEAL BYPASS GRAFT: SHX937

## 2012-09-03 LAB — CBC
MCH: 31.8 pg (ref 26.0–34.0)
MCHC: 33.9 g/dL (ref 30.0–36.0)
Platelets: 280 10*3/uL (ref 150–400)
RBC: 2.64 MIL/uL — ABNORMAL LOW (ref 4.22–5.81)
RDW: 17.7 % — ABNORMAL HIGH (ref 11.5–15.5)

## 2012-09-03 LAB — BASIC METABOLIC PANEL
BUN: 7 mg/dL (ref 6–23)
Calcium: 8.9 mg/dL (ref 8.4–10.5)
GFR calc non Af Amer: >90 mL/min (ref >90)
Glucose, Bld: 138 mg/dL — ABNORMAL HIGH (ref 70–99)

## 2012-09-03 LAB — POCT I-STAT 4, (NA,K, GLUC, HGB,HCT): Glucose, Bld: 107 mg/dL — ABNORMAL HIGH (ref 70–99)

## 2012-09-03 LAB — CREATININE, SERUM: Creatinine, Ser: 0.68 mg/dL (ref 0.50–1.35)

## 2012-09-03 LAB — ABO/RH: ABO/RH(D): A POS

## 2012-09-03 LAB — SURGICAL PCR SCREEN
MRSA, PCR: NEGATIVE
Staphylococcus aureus: NEGATIVE

## 2012-09-03 SURGERY — BYPASS GRAFT FEMORAL-POPLITEAL ARTERY
Anesthesia: General | Site: Leg Upper | Laterality: Left

## 2012-09-03 MED ORDER — MUPIROCIN 2 % EX OINT
TOPICAL_OINTMENT | CUTANEOUS | Status: AC
  Administered 2012-09-03: 07:00:00
  Filled 2012-09-03: qty 22

## 2012-09-03 MED ORDER — GUAIFENESIN-DM 100-10 MG/5ML PO SYRP: 15.0000 mL | ORAL_SOLUTION | ORAL | Status: DC | PRN

## 2012-09-03 MED ORDER — POLYETHYLENE GLYCOL 3350 17 G PO PACK
17.0000 g | PACK | Freq: Every day | ORAL | Status: DC | PRN
  Filled 2012-09-03: qty 1

## 2012-09-03 MED ORDER — ROCURONIUM BROMIDE 100 MG/10ML IV SOLN
INTRAVENOUS | Status: DC | PRN
  Administered 2012-09-03: 5 mg via INTRAVENOUS
  Administered 2012-09-03: 50 mg via INTRAVENOUS

## 2012-09-03 MED ORDER — OXYCODONE-ACETAMINOPHEN 5-325 MG PO TABS
1.0000 | ORAL_TABLET | ORAL | Status: DC | PRN
  Administered 2012-09-03: 1 via ORAL
  Administered 2012-09-04 – 2012-09-06 (×6): 2 via ORAL
  Filled 2012-09-03 (×3): qty 2
  Filled 2012-09-03: qty 1
  Filled 2012-09-03 (×3): qty 2

## 2012-09-03 MED ORDER — NEOSTIGMINE METHYLSULFATE 1 MG/ML IJ SOLN
INTRAMUSCULAR | Status: DC | PRN
  Administered 2012-09-03: 2.5 mg via INTRAVENOUS

## 2012-09-03 MED ORDER — VANCOMYCIN HCL 10 G IV SOLR
1250.0000 mg | Freq: Two times a day (BID) | INTRAVENOUS | Status: DC
  Administered 2012-09-03 – 2012-09-06 (×6): 1250 mg via INTRAVENOUS
  Filled 2012-09-03 (×8): qty 1250

## 2012-09-03 MED ORDER — MEPERIDINE HCL 25 MG/ML IJ SOLN: 6.2500 mg | INTRAMUSCULAR | Status: DC | PRN

## 2012-09-03 MED ORDER — GABAPENTIN 600 MG PO TABS
600.0000 mg | ORAL_TABLET | Freq: Every day | ORAL | Status: DC
  Administered 2012-09-04 – 2012-09-06 (×3): 600 mg via ORAL
  Filled 2012-09-03 (×3): qty 1

## 2012-09-03 MED ORDER — ATORVASTATIN CALCIUM 20 MG PO TABS
20.0000 mg | ORAL_TABLET | Freq: Every day | ORAL | Status: DC
  Administered 2012-09-04 – 2012-09-06 (×3): 20 mg via ORAL
  Filled 2012-09-03 (×3): qty 1

## 2012-09-03 MED ORDER — HYDRALAZINE HCL 20 MG/ML IJ SOLN: 10.0000 mg | INTRAMUSCULAR | Status: DC | PRN

## 2012-09-03 MED ORDER — ASPIRIN 81 MG PO TABS: 81.0000 mg | ORAL_TABLET | Freq: Every day | ORAL | Status: DC

## 2012-09-03 MED ORDER — PHENOL 1.4 % MT LIQD: 1.0000 | OROMUCOSAL | Status: DC | PRN

## 2012-09-03 MED ORDER — ONDANSETRON HCL 4 MG/2ML IJ SOLN: 4.0000 mg | Freq: Once | INTRAMUSCULAR | Status: DC | PRN

## 2012-09-03 MED ORDER — PROPOFOL 10 MG/ML IV BOLUS
INTRAVENOUS | Status: DC | PRN
  Administered 2012-09-03: 25 mg via INTRAVENOUS
  Administered 2012-09-03: 150 mg via INTRAVENOUS
  Administered 2012-09-03: 25 mg via INTRAVENOUS

## 2012-09-03 MED ORDER — SODIUM CHLORIDE 0.9 % IV SOLN
INTRAVENOUS | Status: DC
  Administered 2012-09-03 – 2012-09-04 (×2): 100 mL/h via INTRAVENOUS

## 2012-09-03 MED ORDER — OXYCODONE HCL 5 MG/5ML PO SOLN: 5.0000 mg | Freq: Once | ORAL | Status: DC | PRN

## 2012-09-03 MED ORDER — MIDAZOLAM HCL 5 MG/5ML IJ SOLN
INTRAMUSCULAR | Status: DC | PRN
  Administered 2012-09-03 (×2): 1 mg via INTRAVENOUS

## 2012-09-03 MED ORDER — ACETAMINOPHEN 325 MG PO TABS: 325.0000 mg | ORAL_TABLET | ORAL | Status: DC | PRN

## 2012-09-03 MED ORDER — GLYCOPYRROLATE 0.2 MG/ML IJ SOLN
INTRAMUSCULAR | Status: DC | PRN
  Administered 2012-09-03: 0.3 mg via INTRAVENOUS

## 2012-09-03 MED ORDER — SODIUM CHLORIDE 0.9 % IV SOLN: 500.0000 mL | Freq: Once | INTRAVENOUS | Status: AC | PRN

## 2012-09-03 MED ORDER — ENSURE COMPLETE PO LIQD
237.0000 mL | Freq: Two times a day (BID) | ORAL | Status: DC
  Administered 2012-09-04 (×2): 237 mL via ORAL

## 2012-09-03 MED ORDER — LACTATED RINGERS IV SOLN
INTRAVENOUS | Status: DC | PRN
  Administered 2012-09-03 (×3): via INTRAVENOUS

## 2012-09-03 MED ORDER — LIDOCAINE HCL (CARDIAC) 20 MG/ML IV SOLN
INTRAVENOUS | Status: DC | PRN
  Administered 2012-09-03: 100 mg via INTRAVENOUS

## 2012-09-03 MED ORDER — METOPROLOL TARTRATE 1 MG/ML IV SOLN: 2.0000 mg | INTRAVENOUS | Status: DC | PRN

## 2012-09-03 MED ORDER — 0.9 % SODIUM CHLORIDE (POUR BTL) OPTIME
TOPICAL | Status: DC | PRN
  Administered 2012-09-03: 2000 mL

## 2012-09-03 MED ORDER — ENOXAPARIN SODIUM 40 MG/0.4ML ~~LOC~~ SOLN
40.0000 mg | SUBCUTANEOUS | Status: DC
  Administered 2012-09-04 – 2012-09-06 (×3): 40 mg via SUBCUTANEOUS
  Filled 2012-09-03 (×3): qty 0.4

## 2012-09-03 MED ORDER — ONDANSETRON HCL 4 MG/2ML IJ SOLN
INTRAMUSCULAR | Status: DC | PRN
  Administered 2012-09-03 (×2): 4 mg via INTRAVENOUS

## 2012-09-03 MED ORDER — DOCUSATE SODIUM 100 MG PO CAPS
100.0000 mg | ORAL_CAPSULE | Freq: Every day | ORAL | Status: DC
  Administered 2012-09-04 – 2012-09-06 (×3): 100 mg via ORAL
  Filled 2012-09-03 (×3): qty 1

## 2012-09-03 MED ORDER — LABETALOL HCL 5 MG/ML IV SOLN
10.0000 mg | INTRAVENOUS | Status: DC | PRN
  Filled 2012-09-03: qty 4

## 2012-09-03 MED ORDER — ASPIRIN EC 81 MG PO TBEC
81.0000 mg | DELAYED_RELEASE_TABLET | Freq: Every day | ORAL | Status: DC
  Administered 2012-09-04 – 2012-09-06 (×3): 81 mg via ORAL
  Filled 2012-09-03 (×3): qty 1

## 2012-09-03 MED ORDER — BUDESONIDE-FORMOTEROL FUMARATE 160-4.5 MCG/ACT IN AERO
2.0000 | INHALATION_SPRAY | Freq: Two times a day (BID) | RESPIRATORY_TRACT | Status: DC | PRN
  Filled 2012-09-03: qty 6

## 2012-09-03 MED ORDER — NORTRIPTYLINE HCL 25 MG PO CAPS
75.0000 mg | ORAL_CAPSULE | Freq: Every day | ORAL | Status: DC
  Administered 2012-09-03 – 2012-09-05 (×3): 75 mg via ORAL
  Filled 2012-09-03 (×4): qty 3

## 2012-09-03 MED ORDER — MORPHINE SULFATE 2 MG/ML IJ SOLN: 2.0000 mg | INTRAMUSCULAR | Status: DC | PRN

## 2012-09-03 MED ORDER — HEMOSTATIC AGENTS (NO CHARGE) OPTIME
TOPICAL | Status: DC | PRN
  Administered 2012-09-03: 1 via TOPICAL

## 2012-09-03 MED ORDER — HEPARIN SODIUM (PORCINE) 1000 UNIT/ML IJ SOLN
INTRAMUSCULAR | Status: DC | PRN
  Administered 2012-09-03: 5000 [IU] via INTRAVENOUS

## 2012-09-03 MED ORDER — PHENYLEPHRINE HCL 10 MG/ML IJ SOLN
30.0000 ug/min | INTRAVENOUS | Status: DC
  Filled 2012-09-03: qty 1

## 2012-09-03 MED ORDER — FENTANYL CITRATE 0.05 MG/ML IJ SOLN
INTRAMUSCULAR | Status: DC | PRN
  Administered 2012-09-03 (×2): 25 ug via INTRAVENOUS
  Administered 2012-09-03: 50 ug via INTRAVENOUS
  Administered 2012-09-03: 100 ug via INTRAVENOUS
  Administered 2012-09-03 (×3): 50 ug via INTRAVENOUS

## 2012-09-03 MED ORDER — SODIUM CHLORIDE 0.9 % IR SOLN
Status: DC | PRN
  Administered 2012-09-03: 09:00:00

## 2012-09-03 MED ORDER — POTASSIUM CHLORIDE CRYS ER 20 MEQ PO TBCR
20.0000 meq | EXTENDED_RELEASE_TABLET | Freq: Every day | ORAL | Status: DC | PRN
  Administered 2012-09-04: 40 meq via ORAL
  Filled 2012-09-03: qty 2

## 2012-09-03 MED ORDER — ONDANSETRON HCL 4 MG/2ML IJ SOLN: 4.0000 mg | Freq: Four times a day (QID) | INTRAMUSCULAR | Status: DC | PRN

## 2012-09-03 MED ORDER — ARTIFICIAL TEARS OP OINT
TOPICAL_OINTMENT | OPHTHALMIC | Status: DC | PRN
  Administered 2012-09-03: 1 via OPHTHALMIC

## 2012-09-03 MED ORDER — OXYCODONE HCL 5 MG PO TABS: 5.0000 mg | ORAL_TABLET | Freq: Once | ORAL | Status: DC | PRN

## 2012-09-03 MED ORDER — DEXTROSE 5 % IV SOLN
1.5000 g | INTRAVENOUS | Status: DC
  Filled 2012-09-03: qty 1.5

## 2012-09-03 MED ORDER — ACETAMINOPHEN 650 MG RE SUPP: 325.0000 mg | RECTAL | Status: DC | PRN

## 2012-09-03 MED ORDER — TAMSULOSIN HCL 0.4 MG PO CAPS
0.4000 mg | ORAL_CAPSULE | Freq: Every day | ORAL | Status: DC
  Administered 2012-09-04 – 2012-09-06 (×3): 0.4 mg via ORAL
  Filled 2012-09-03 (×3): qty 1

## 2012-09-03 MED ORDER — SODIUM CHLORIDE 0.9 % IV SOLN
10.0000 mg | INTRAVENOUS | Status: DC | PRN
  Administered 2012-09-03: 20 ug/min via INTRAVENOUS

## 2012-09-03 MED ORDER — DEXTROSE 5 % IV SOLN
2.0000 g | Freq: Three times a day (TID) | INTRAVENOUS | Status: DC
  Administered 2012-09-03 – 2012-09-06 (×9): 2 g via INTRAVENOUS
  Filled 2012-09-03 (×11): qty 2

## 2012-09-03 MED ORDER — PROCHLORPERAZINE MALEATE 10 MG PO TABS
10.0000 mg | ORAL_TABLET | Freq: Four times a day (QID) | ORAL | Status: DC | PRN
  Filled 2012-09-03: qty 1

## 2012-09-03 MED ORDER — METRONIDAZOLE 500 MG PO TABS
500.0000 mg | ORAL_TABLET | Freq: Three times a day (TID) | ORAL | Status: DC
  Administered 2012-09-04 – 2012-09-06 (×8): 500 mg via ORAL
  Filled 2012-09-03 (×11): qty 1

## 2012-09-03 MED ORDER — HYDROMORPHONE HCL PF 1 MG/ML IJ SOLN: 0.2500 mg | INTRAMUSCULAR | Status: DC | PRN

## 2012-09-03 MED ORDER — BISACODYL 10 MG RE SUPP: 10.0000 mg | Freq: Every day | RECTAL | Status: DC | PRN

## 2012-09-03 MED ORDER — PROTAMINE SULFATE 10 MG/ML IV SOLN
INTRAVENOUS | Status: DC | PRN
  Administered 2012-09-03: 25 mg via INTRAVENOUS

## 2012-09-03 SURGICAL SUPPLY — 71 items
BANDAGE ELASTIC 4 VELCRO ST LF (GAUZE/BANDAGES/DRESSINGS) ×2 IMPLANT
BANDAGE ESMARK 6X9 LF (GAUZE/BANDAGES/DRESSINGS) ×1 IMPLANT
BLADE SURG 10 STRL SS (BLADE) ×2 IMPLANT
BNDG ESMARK 6X9 LF (GAUZE/BANDAGES/DRESSINGS) ×2
CANISTER SUCTION 2500CC (MISCELLANEOUS) ×2 IMPLANT
CLIP TI MEDIUM 24 (CLIP) ×2 IMPLANT
CLIP TI WIDE RED SMALL 24 (CLIP) ×4 IMPLANT
CLOTH BEACON ORANGE TIMEOUT ST (SAFETY) ×2 IMPLANT
COVER PROBE W GEL 5X96 (DRAPES) ×2 IMPLANT
COVER SURGICAL LIGHT HANDLE (MISCELLANEOUS) ×2 IMPLANT
CUFF TOURNIQUET SINGLE 18IN (TOURNIQUET CUFF) ×2 IMPLANT
CUFF TOURNIQUET SINGLE 24IN (TOURNIQUET CUFF) IMPLANT
CUFF TOURNIQUET SINGLE 34IN LL (TOURNIQUET CUFF) IMPLANT
CUFF TOURNIQUET SINGLE 44IN (TOURNIQUET CUFF) IMPLANT
DERMABOND ADVANCED (GAUZE/BANDAGES/DRESSINGS) ×4
DERMABOND ADVANCED .7 DNX12 (GAUZE/BANDAGES/DRESSINGS) ×4 IMPLANT
DRAIN CHANNEL 15F RND FF W/TCR (WOUND CARE) IMPLANT
DRAPE WARM FLUID 44X44 (DRAPE) ×2 IMPLANT
DRAPE X-RAY CASS 24X20 (DRAPES) IMPLANT
DRSG COVADERM 4X10 (GAUZE/BANDAGES/DRESSINGS) IMPLANT
DRSG COVADERM 4X8 (GAUZE/BANDAGES/DRESSINGS) IMPLANT
ELECT REM PT RETURN 9FT ADLT (ELECTROSURGICAL) ×2
ELECTRODE REM PT RTRN 9FT ADLT (ELECTROSURGICAL) ×1 IMPLANT
EVACUATOR SILICONE 100CC (DRAIN) IMPLANT
GLOVE BIO SURGEON STRL SZ 6.5 (GLOVE) ×2 IMPLANT
GLOVE BIO SURGEON STRL SZ7 (GLOVE) ×4 IMPLANT
GLOVE BIOGEL PI IND STRL 6.5 (GLOVE) ×4 IMPLANT
GLOVE BIOGEL PI IND STRL 7.0 (GLOVE) ×2 IMPLANT
GLOVE BIOGEL PI IND STRL 7.5 (GLOVE) ×1 IMPLANT
GLOVE BIOGEL PI INDICATOR 6.5 (GLOVE) ×4
GLOVE BIOGEL PI INDICATOR 7.0 (GLOVE) ×2
GLOVE BIOGEL PI INDICATOR 7.5 (GLOVE) ×1
GLOVE ECLIPSE 6.5 STRL STRAW (GLOVE) ×4 IMPLANT
GLOVE SURG SS PI 7.5 STRL IVOR (GLOVE) ×2 IMPLANT
GOWN PREVENTION PLUS XLARGE (GOWN DISPOSABLE) ×2 IMPLANT
GOWN PREVENTION PLUS XXLARGE (GOWN DISPOSABLE) ×2 IMPLANT
GOWN STRL NON-REIN LRG LVL3 (GOWN DISPOSABLE) ×8 IMPLANT
GRAFT PROPATEN W/RING 6X80X60 (Vascular Products) ×2 IMPLANT
HEMOSTAT SNOW SURGICEL 2X4 (HEMOSTASIS) IMPLANT
KIT BASIN OR (CUSTOM PROCEDURE TRAY) ×2 IMPLANT
KIT ROOM TURNOVER OR (KITS) ×2 IMPLANT
MARKER GRAFT CORONARY BYPASS (MISCELLANEOUS) IMPLANT
NS IRRIG 1000ML POUR BTL (IV SOLUTION) ×4 IMPLANT
PACK PERIPHERAL VASCULAR (CUSTOM PROCEDURE TRAY) ×2 IMPLANT
PAD ARMBOARD 7.5X6 YLW CONV (MISCELLANEOUS) ×4 IMPLANT
PADDING CAST COTTON 6X4 STRL (CAST SUPPLIES) ×2 IMPLANT
SET COLLECT BLD 21X3/4 12 (NEEDLE) IMPLANT
SLEEVE SURGEON STRL (DRAPES) ×2 IMPLANT
SPONGE GAUZE 4X4 12PLY (GAUZE/BANDAGES/DRESSINGS) ×2 IMPLANT
STAPLER VISISTAT 35W (STAPLE) IMPLANT
STOPCOCK 4 WAY LG BORE MALE ST (IV SETS) IMPLANT
SUT ETHILON 3 0 PS 1 (SUTURE) IMPLANT
SUT GORETEX 6.0 TT13 (SUTURE) ×2 IMPLANT
SUT GORETEX 6.0 TT9 (SUTURE) ×4 IMPLANT
SUT PROLENE 5 0 C 1 24 (SUTURE) ×2 IMPLANT
SUT PROLENE 6 0 BV (SUTURE) ×10 IMPLANT
SUT PROLENE 7 0 BV 1 (SUTURE) ×2 IMPLANT
SUT SILK 2 0 SH (SUTURE) ×2 IMPLANT
SUT SILK 3 0 (SUTURE) ×1
SUT SILK 3-0 18XBRD TIE 12 (SUTURE) ×1 IMPLANT
SUT VIC AB 2-0 CT1 27 (SUTURE) ×2
SUT VIC AB 2-0 CT1 TAPERPNT 27 (SUTURE) ×2 IMPLANT
SUT VIC AB 3-0 SH 27 (SUTURE) ×4
SUT VIC AB 3-0 SH 27X BRD (SUTURE) ×4 IMPLANT
SUT VICRYL 4-0 PS2 18IN ABS (SUTURE) ×8 IMPLANT
TOWEL OR 17X24 6PK STRL BLUE (TOWEL DISPOSABLE) ×4 IMPLANT
TOWEL OR 17X26 10 PK STRL BLUE (TOWEL DISPOSABLE) ×6 IMPLANT
TRAY FOLEY CATH 16FRSI W/METER (SET/KITS/TRAYS/PACK) ×2 IMPLANT
TUBING EXTENTION W/L.L. (IV SETS) IMPLANT
UNDERPAD 30X30 INCONTINENT (UNDERPADS AND DIAPERS) ×2 IMPLANT
WATER STERILE IRR 1000ML POUR (IV SOLUTION) ×2 IMPLANT

## 2012-09-03 NOTE — Op Note (Signed)
[facility]  Patient name: [name] MRN: [id number] DOB: 08/09/1950 Sex: male  09/03/2012 Pre-operative Diagnosis: Left leg ulcer Post-operative diagnosis:  Same Surgeon:  Jorge Ny Procedure:   Left femoral to above-knee popliteal artery bypass with 6 mm propatent PTFE   #2: Harvest, left great saphenous vein Anesthesia:  Gen. Blood Loss:  See anesthesia record Specimens:  None  Findings:  Distal target was a healthy artery behind the patella. The proximal anastomosis was to the common femoral artery which had minimal disease. I harvested the left saphenous vein. By ultrasound imaging it appeared to be adequate however when preparing the vein on the back table it had a long sclerotic segment which did not dilate. Therefore I did not think it was an adequate conduit, and so I elected to use PTFE  Indications:  The patient has previously undergone percutaneous interventions which have now occluded. He presented to the hospital with ischemic changes to his left foot. He has subsequently undergone amputation of his fourth toe. He is here for limb salvage femoral-popliteal bypass graft. He has a history of metastatic lung cancer, currently undergoing chemotherapy. I have been getting clearance from his oncologist/pulmonologist to proceed.  Procedure:  The patient was identified in the holding area and taken to [facility] OR ROOM 17  The patient was then placed supine on the table. general anesthesia was administered.  The patient was prepped and draped in the usual sterile fashion.  A time out was called and antibiotics were administered.  A longitudinal left groin incision was made. Cautery was used to divide the subcutaneous tissue down to the femoral sheath. The common femoral artery was then dissected out sharply up to the inguinal ligament and down to the bifurcation. A Starr close device was visualized within the mid common femoral artery. The artery  appeared healthy without calcified plaque. There was a good pulse within the artery. Next, I identified the saphenofemoral junction. The saphenous vein was dissected free. At the saphenofemoral junction it was approximately a 4 mm vein. I then proceeded to dissect out the saphenous vein through skip incisions down the leg. Side branches were ligated between silk ties. The vein was then fully harvested. Using the vein harvest incision just above the knee I entered the popliteal space and exposed the popliteal artery. The patient had the stents within the superficial femoral artery that were easily identified. I dissected out the artery for approximately 8 cm below the stents, down to the mid to distal patella. The artery in this area was soft. A large collateral was visualized. Next a tunnel was then created in a subsartorial plane. The vein was then removed from the field. Distally it was ligated with a 2-0 silk tie. The saphenofemoral junction was oversewn with a 5-0 Prolene in 2 layers. I then distended the vein on the back table. There was a long segment sclerotic area in the midportion of approximately 20 cm that did not dilate greater than 2 mm. I felt that the vein was not a suitable conduit, and therefore I elected to use PTFE. The patient was fully heparinized. After the heparin circulated the common femoral artery was occluded with vascular clamps. A longitudinal arteriotomy was made with a 11 blade. This was extended with Potts scissors the longitudinal direction. The graft was then cut to fit the size of the arteriotomy, and a running anastomosis was created with CV 6 Gore-Tex suture. Prior to completion, the appropriate flushing maneuvers  were performed, and the anastomosis was completed. There was excellent pulsatile flow through the graft. The graft was then occluded and flushed with heparin saline. It was brought through the previously created tunnel, making sure to maintain proper orientation. After  the graft was brought to the tunnel a tourniquet was placed on the upper thigh. The leg was exsanguinated with an Esmarch. The tourniquet was taken to 250 mm of pressure. I then opened the popliteal artery behind the patella with a #11 blade and extended the arteriotomy in a longitudinal fashion. The arteriotomy was positioned around a large collateral. The graft was cut to the appropriate length and then spatulated. A running anastomosis was created with 6-0 Prolene. Prior to completion, the tourniquet was let down. The graft was appropriately flushed. The anastomosis was completed. The patient had excellent Doppler dependent signal in his dorsalis pedis artery. 25 mg of protamine was then administered. The groin incision was closed with a 2-0 Vicryl to reapproximate the femoral sheath. 3-0 Vicryl was then used to close the subcutaneous tissue, followed by 4-0 Vicryl. The vein harvest incisions were closed by a 3-0 Vicryl in the subcutaneous tissue and a 4-0 Vicryl. The popliteal artery incision was closed by reapproximating the fascia with 2-0 Vicryl, subcutaneous tissue with 3-0 Vicryl, and the skin with 4-0 Vicryl. Dermabond placed on the incisions. There were no complications.   Disposition:  To PACU in stable condition.   Juleen China, M.D. [facility] [place] Office: [phone] Pager:  [phone]

## 2012-09-03 NOTE — Interval H&P Note (Signed)
History and Physical Interval Note:  09/03/2012 7:28 AM  [name]  has presented today for surgery, with the diagnosis of PVD;GANGRENE FOOTLEFT  The various methods of treatment have been discussed with the patient and family. After consideration of risks, benefits and other options for treatment, the patient has consented to  Procedure(s): BYPASS GRAFT FEMORAL-POPLITEAL ARTERY (Left) as a surgical intervention .  The patient's history has been reviewed, patient examined, no change in status, stable for surgery.  I have reviewed the patient's chart and labs.  Questions were answered to the patient's satisfaction.     BRABHAM IV, V. WELLS

## 2012-09-03 NOTE — H&P (View-Only) (Signed)
[facility]      Consult Note  Patient name: [name] MRN: [id number] DOB: 02/22/1950 Sex: male  Consulting Physician:  Dr. Berry  Reason for Consult:  Chief Complaint  Patient presents with  . Fever    HISTORY OF PRESENT ILLNESS: This is a 62 yo male who was admitted with a left 5th toe ulcer, present for several weeks.  He underwent 4th and 5th ray amputation by Dr. Hewitt. MRI suggested early osteo and cellulitis. He suffers from PVD and has previously undergone atherectomy and angioplasty of his left SFA in 2012.  In 2013 he required stenting for a total occlusion.  He also underwent additional atherectomy later in 2013.  He underwent angiogram earlier today which revealed an occluded SFA with reconstitution of the above knee popliteal artery with single vessel runoff via the peroneal artery.  The patient is currently undergoing chemotherapy for stage IV lung cancer, dx'd in March 2014.  The patient suffered a stroke in 2013 with residual right sided weakness.  He is medically managed for his hypercholesterolemia with a statin.  He is also treated for HTN with an ACE inhibitor.  He is an active smoker.  I have been asked to consult for possible surgical bypass due to concerns of healing his toe amputation.  Past Medical History  Diagnosis Date  . BENIGN PROSTATIC HYPERTROPHY 12/24/2009  . CEREBROVASCULAR ACCIDENT, HX OF 12/24/2009  . HYPERLIPIDEMIA 12/24/2009  . HYPERTENSION 12/24/2009  . Testosterone deficiency   . ED (erectile dysfunction)   . Claudication in peripheral vascular disease, lifestyle limiting, with abnormal arterial dopplers of lower ext. 06/30/2011  . S/P angioplasty with stent to Lt. SFA 06/30/11 06/30/2011  . COPD 12/24/2009  . Shortness of breath     "from the COPD" (10/13/2011)  . Stroke 1996    "right side not as strong as left since; numbness R leg/foot" (10/13/2011)  . Pernicious anemia     Past Surgical History  Procedure  Laterality Date  . Cervical fusion  1980's    "total of 3-4 OR's on my neck; pinched nerve" (10/13/2011)  . Aneurysm coiling  2007  . Peripheral arterial stent graft  06/30/2010; 10/13/10    left; left  . Video bronchoscopy Bilateral 04/03/2012    Procedure: VIDEO BRONCHOSCOPY WITHOUT FLUORO;  Surgeon: Michael B Wert, MD;  Location: [facility];  Service: Cardiopulmonary;  Laterality: Bilateral;  . Amputation Left 08/26/2012    Procedure: IRRIGATION AND DEBRIDMENT LEFT DORSAL ABSCESS/ 4TH AND 5TH LEFT RAY AMPUTATION ;  Surgeon: John Hewitt, MD;  Location: [facility];  Service: Orthopedics;  Laterality: Left;    History   Social History  . Marital Status: Married    Spouse Name: N/A    Number of Children: 3  . Years of Education: N/A   Occupational History  . Retired     Sales   Social History Main Topics  . Smoking status: Current Every Day Smoker -- 0.50 packs/day for 45 years    Types: Cigarettes  . Smokeless tobacco: Never Used  . Alcohol Use: No  . Drug Use: No  . Sexually Active: Not Currently   Other Topics Concern  . Not on file   Social History Narrative  . No narrative on file    Family History  Problem Relation Age of Onset  . Emphysema Father     smoked  . Emphysema Paternal Uncle     smoked  . Heart disease Mother   .   Lung cancer Father     smoked  . Lung cancer Paternal Grandfather     smoked    Allergies as of 08/21/2012  . (No Known Allergies)    No current facility-administered medications on file prior to encounter.   Current Outpatient Prescriptions on File Prior to Encounter  Medication Sig Dispense Refill  . acetaminophen (TYLENOL) 500 MG tablet Take 1,000 mg by mouth every 6 (six) hours as needed for pain.       . aspirin 81 MG tablet Take 81 mg by mouth daily.      . atorvastatin (LIPITOR) 20 MG tablet Take 20 mg by mouth daily.      . budesonide-formoterol (SYMBICORT) 160-4.5 MCG/ACT inhaler Inhale 2 puffs into the lungs 2 (two) times  daily as needed (for shortness of breath).      . clopidogrel (PLAVIX) 75 MG tablet Take 75 mg by mouth daily.      . doxycycline (VIBRA-TABS) 100 MG tablet Take 1 tablet (100 mg total) by mouth 2 (two) times daily.  20 tablet  0  . gabapentin (NEURONTIN) 600 MG tablet Take 600 mg by mouth every morning.       . lisinopril (PRINIVIL,ZESTRIL) 20 MG tablet Take 20 mg by mouth daily.      . mupirocin cream (BACTROBAN) 2 % Apply topically daily. Onto left plantar wound  15 g  0  . nortriptyline (PAMELOR) 75 MG capsule Take 75 mg by mouth at bedtime.      . prochlorperazine (COMPAZINE) 10 MG tablet Take 10 mg by mouth every 6 (six) hours as needed (for nausea).      . tamsulosin (FLOMAX) 0.4 MG CAPS Take 0.4 mg by mouth daily.          REVIEW OF SYSTEMS: See HPI, all other systems negative  PHYSICAL EXAMINATION: General: The patient appears their stated age.  Vital signs are BP 126/66  Pulse 100  Temp(Src) 98.5 F (36.9 C) (Oral)  Resp 16  Ht 5' 6" (1.676 m)  Wt 136 lb 14.5 oz (62.1 kg)  BMI 22.11 kg/m2  SpO2 92% Pulmonary: Respirations are non-labored HEENT:  No gross abnormalities Abdomen: Soft and non-tender  Musculoskeletal: There are no major deformities.   Neurologic: No focal weakness or paresthesias are detected, Skin: left foot dressing intact Psychiatric: The patient has normal affect. Cardiovascular: There is a regular rate and rhythm without significant murmur appreciated.   Assessment:  Left foot ulcer, s/p toe amputation x2 Plan: This is a very complicated situation given that the patient is undergoing active treatment for stage IV lung cancer.  He is at high risk for not healing his amputation site given his poor circulation.  His chances of wound healing would be much higher with revascularization.  I would consider left leg bypass for limb salvage, but the patient will need to be cleared medically.  I would recommend getting oncology involved to evaluate his  pulmonary status and get their input regarding surgical revascularization with regards to how it will impact his lung cancer treatment.  He will need to be off plavix for 5 days before surgery.  I ordered LE vein mapping.  I will follow up with the patient tomorrow.     V. [place] IV, M.D. [facility] Office: [phone] Pager:  [phone] 

## 2012-09-03 NOTE — Progress Notes (Signed)
ANTIBIOTIC CONSULT NOTE - INITIAL  Pharmacy Consult for Vancomycin Indication: osteomyelitis  No Known Allergies  Patient Measurements: Height: 5\' 6"  (167.6 cm) Weight: 121 lb 14.6 oz (55.3 kg) IBW/kg (Calculated) : 63.8  Vital Signs: Temp: 98.7 F (37.1 C) (08/19 1655) Temp src: Oral (08/19 0614) BP: 118/53 mmHg (08/19 1655) Pulse Rate: 89 (08/19 1655)  Intake/Output from this shift: Total I/O In: 2200 [I.V.:2200] Out: 955 [Urine:755; Blood:200]  Labs:  Recent Labs  09/03/12 0718 09/03/12 1357  HGB 10.9*  --   CREATININE  --  0.64   Estimated Creatinine Clearance: 73.9 ml/min (by C-G formula based on Cr of 0.64).  Medical History: Past Medical History  Diagnosis Date  . BENIGN PROSTATIC HYPERTROPHY 12/24/2009  . CEREBROVASCULAR ACCIDENT, HX OF 12/24/2009  . HYPERLIPIDEMIA 12/24/2009  . HYPERTENSION 12/24/2009  . Testosterone deficiency   . ED (erectile dysfunction)   . Claudication in peripheral vascular disease, lifestyle limiting, with abnormal arterial dopplers of lower ext. 06/30/2011  . S/P angioplasty with stent to Lt. SFA 06/30/11 06/30/2011  . COPD 12/24/2009  . Shortness of breath     "from the COPD" (10/13/2011)  . Stroke 1996    "right side not as strong as left since; numbness R leg/foot" (10/13/2011)  . Pernicious anemia   . Complication of anesthesia     hard to wake up  . Lung cancer    Assessment:   s/p fem-pop bypass graft. To continue antibiotics as prior to admission.  Rx assisted with Vancomycin dosing during 8/7-8/14 admission for gangrene, abscess, osteomyelitis, s/p left 4th & 5th Ray amputation and I&D of foot absess on 08/26/12.  Plan was for 4-6 weeks antibiotics from 8/11 (through 10/07/12. No organisms isolated.   Discharged on 8/14 on Vancomycin 1250 mg IV q12hrs, Cefepime 2 grams IV q8hrs, and Flagyl 500 mg PO q8hrs, per ID guidance (see 8/13 note).    Spoke with Mrs. Harsha via telephone.  Last Vancomycin dose was 8/18 pm. Has been  getting Vancomycin at 8am/8pm.  Dose has not been changed since discharged last week.        Vanc begun 8/7 - dose incr from 1gm to 1250 mg IV q12hrs on 8/10       Vanc trough on 8/10 = 10.7 mcg/ml       Vanc trough on 8/14 = 19.7 mcg/ml     Zosyn 8/6>>8/13    Cefepime 8/13>>    Flagyl PO 8/13>>  Goal of Therapy:  Vancomycin trough level 15-20 mcg/ml  Plan:    Continue Vancomycin 1250 mg IV q12hrs.   Continue Cefepime 2 grams IV q8 hrs and Flagyl 500 mg PO q8hrs.   Will plan to check Vancomycin trough level in a few days.  Dennie Fetters, [place] Pager: [phone] 09/03/2012,6:06 PM

## 2012-09-03 NOTE — Preoperative (Signed)
Beta Blockers   Reason not to administer Beta Blockers:Not Applicable 

## 2012-09-03 NOTE — Progress Notes (Signed)
Report given to Maria RN.

## 2012-09-03 NOTE — Anesthesia Preprocedure Evaluation (Addendum)
Anesthesia Evaluation  Patient identified by MRN, date of birth, ID band Patient awake    Reviewed: Allergy & Precautions, H&P , NPO status , Patient's Chart, lab work & pertinent test results  Airway Mallampati: I TM Distance: >3 FB Neck ROM: Full    Dental  (+) Dental Advisory Given Very poor dentition:   Pulmonary shortness of breath, COPD         Cardiovascular hypertension, Pt. on medications + Peripheral Vascular Disease     Neuro/Psych    GI/Hepatic   Endo/Other    Renal/GU      Musculoskeletal   Abdominal   Peds  Hematology   Anesthesia Other Findings   Reproductive/Obstetrics                          Anesthesia Physical Anesthesia Plan  ASA: III  Anesthesia Plan: General   Post-op Pain Management:    Induction: Intravenous  Airway Management Planned: Oral ETT  Additional Equipment:   Intra-op Plan:   Post-operative Plan: Extubation in OR  Informed Consent: I have reviewed the patients History and Physical, chart, labs and discussed the procedure including the risks, benefits and alternatives for the proposed anesthesia with the patient or authorized representative who has indicated his/her understanding and acceptance.     Plan Discussed with: CRNA and Surgeon  Anesthesia Plan Comments:         Anesthesia Quick Evaluation

## 2012-09-03 NOTE — Anesthesia Procedure Notes (Signed)
Procedure Name: Intubation Date/Time: 09/03/2012 7:49 AM Performed by: Armandina Gemma Pre-anesthesia Checklist: Patient identified, Timeout performed, Emergency Drugs available, Suction available and Patient being monitored Patient Re-evaluated:Patient Re-evaluated prior to inductionOxygen Delivery Method: Circle system utilized Preoxygenation: Pre-oxygenation with 100% oxygen Intubation Type: IV induction Ventilation: Mask ventilation without difficulty Laryngoscope Size: Miller and 2 Tube type: Oral Tube size: 7.5 mm Number of attempts: 1 Airway Equipment and Method: Stylet Placement Confirmation: ETT inserted through vocal cords under direct vision,  breath sounds checked- equal and bilateral and positive ETCO2 Secured at: 22 cm Tube secured with: Tape Dental Injury: Teeth and Oropharynx as per pre-operative assessment  Comments: IV induction Ossey- intubation AM CRNA- atraumatic - teeth and mouth as preop- very poor dentition- regular ETT per [employer]

## 2012-09-03 NOTE — Anesthesia Postprocedure Evaluation (Signed)
Anesthesia Post Note  Patient: [name]  Procedure(s) Performed: Procedure(s) (LRB): BYPASS GRAFT FEMORAL-POPLITEAL ARTERY (Left)  Anesthesia type: general  Patient location: PACU  Post pain: Pain level controlled  Post assessment: Patient's Cardiovascular Status Stable  Last Vitals:  Filed Vitals:   09/03/12 1500  BP: 144/62  Pulse:   Temp:   Resp:     Post vital signs: Reviewed and stable  Level of consciousness: sedated  Complications: No apparent anesthesia complications

## 2012-09-03 NOTE — Progress Notes (Signed)
Pt. Unable to give urine speciman.

## 2012-09-03 NOTE — Transfer of Care (Signed)
Immediate Anesthesia Transfer of Care Note  Patient: [name]  Procedure(s) Performed: Procedure(s): BYPASS GRAFT FEMORAL-POPLITEAL ARTERY (Left)  Patient Location: PACU  Anesthesia Type:General  Level of Consciousness: awake, alert  and oriented  Airway & Oxygen Therapy: Patient Spontanous Breathing and Patient connected to face mask oxygen  Post-op Assessment: Report given to PACU RN  Post vital signs: Reviewed and stable  Complications: No apparent anesthesia complications

## 2012-09-04 ENCOUNTER — Encounter (HOSPITAL_COMMUNITY): Payer: Self-pay | Admitting: Surgery

## 2012-09-04 DIAGNOSIS — I96 Gangrene, not elsewhere classified: Secondary | ICD-10-CM

## 2012-09-04 LAB — BASIC METABOLIC PANEL
Calcium: 8.1 mg/dL — ABNORMAL LOW (ref 8.4–10.5)
GFR calc non Af Amer: >90 mL/min (ref >90)
Glucose, Bld: 90 mg/dL (ref 70–99)
Sodium: 140 mEq/L (ref 135–145)

## 2012-09-04 LAB — CBC
Hemoglobin: 7.6 g/dL — ABNORMAL LOW (ref 13.0–17.0)
MCH: 31.4 pg (ref 26.0–34.0)
MCHC: 33.8 g/dL (ref 30.0–36.0)

## 2012-09-04 MED ORDER — BOOST / RESOURCE BREEZE PO LIQD
1.0000 | Freq: Two times a day (BID) | ORAL | Status: DC
  Administered 2012-09-05 – 2012-09-06 (×3): 1 via ORAL

## 2012-09-04 NOTE — Progress Notes (Signed)
[facility]  Patient Status: Active (receiving services up to time of hospitalization)  [facility] is providing the following services: RN, PT, OT and Home Infusion Services (teaching and education will be done by nurse in the home with patient and caregiver)  If patient discharges after hours, please call [phone].   [name] 09/04/2012, 10:00 AM

## 2012-09-04 NOTE — Progress Notes (Signed)
Physical Therapy Evaluation Patient Details Name: [name] MRN: [id number] DOB: 22-Mar-1950 Today's Date: 09/04/2012 Time: 4098-1191 PT Time Calculation (min): 27 min  PT Assessment / Plan / Recommendation History of Present Illness  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 He has been receiving chemo for lung cancer, it is currently on hold. 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   Clinical Impression  Pt limited with mobility by pain in the LLE but able to ambulate 15' with RW and min A. Pt will benefit from acute PT to increase ROM and strength of the LLE and increase mobility and independence to d/c home with family. Recommend HHPT and RW for d/c.    PT Assessment  Patient needs continued PT services    Follow Up Recommendations  Home health PT;Supervision/Assistance - 24 hour    Does the patient have the potential to tolerate intense rehabilitation      Barriers to Discharge        Equipment Recommendations  Rolling walker with 5" wheels    Recommendations for Other Services     Frequency Min 3X/week    Precautions / Restrictions Precautions Precautions: Fall Required Braces or Orthoses: Other Brace/Splint Other Brace/Splint: post-op shoe for left foot Restrictions Weight Bearing Restrictions: No   Pertinent Vitals/Pain Faces 6/10 left LE pain with standing      Mobility  Bed Mobility Bed Mobility: Supine to Sit;Sitting - Scoot to Edge of Bed Supine to Sit: 4: Min assist Sitting - Scoot to [place] of Bed: 4: Min assist Details for Bed Mobility Assistance: min A to left leg, pt very fearful of mvmt because he felt that incisions were tearing open. Incisions checked and in tact, pt encouraged Transfers Transfers: Sit to Stand;Stand to Sit Sit to Stand: 4: Min assist;From bed Stand to Sit: 4: Min assist;To chair/3-in-1 Details for Transfer Assistance: min A to steady and due to  pain in left leg. Post-op shoe placed on left foot and tennis shoe on right to balance him Ambulation/Gait Ambulation/Gait Assistance: 4: Min assist Ambulation Distance (Feet): 15 Feet Assistive device: Rolling walker Ambulation/Gait Assistance Details: pt could not tolerate further ambulation today due to LLE pain with leg in dependent position. Reliance on RW for support but able to get left heel fully to floor with post-op shoe. Education given on keeping wt posterior on left for continues healing of amps. Gait Pattern: Step-to pattern;Decreased stance time - left;Trunk flexed Gait velocity: decreased Stairs: No Wheelchair Mobility Wheelchair Mobility: No    Exercises     PT Diagnosis: Difficulty walking;Abnormality of gait;Acute pain  PT Problem List: Decreased strength;Decreased activity tolerance;Decreased range of motion;Decreased balance;Decreased mobility;Decreased knowledge of use of DME;Decreased knowledge of precautions;Pain PT Treatment Interventions: DME instruction;Gait training;Stair training;Functional mobility training;Therapeutic activities;Therapeutic exercise;Balance training;Patient/family education     PT Goals(Current goals can be found in the care plan section) Acute Rehab PT Goals Patient Stated Goal: to return home PT Goal Formulation: With patient/family Time For Goal Achievement: 09/18/12 Potential to Achieve Goals: Good  Visit Information  Last PT Received On: 09/04/12 Assistance Needed: +1 PT/OT Co-Evaluation/Treatment: Yes History of Present Illness: 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 He has been receiving chemo for lung cancer, it is currently on hold. 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  Prior Functioning  Home Living Family/patient expects to be discharged to:: Private residence Living Arrangements: Spouse/significant other Available Help at  Discharge: Family;Available 24 hours/day Type of Home: House Home Access: Stairs to enter [employer] of Steps: 3 Entrance Stairs-Rails: Right Home Layout: One level Home Equipment: Wheelchair - manual;Cane - single point Additional Comments: pt's wife works during the day but she reports that family can be with him while she is gone and had been with him PTA Prior Function Level of Independence: Independent Communication Communication: No difficulties Dominant Hand: Right    Cognition  Cognition Arousal/Alertness: Awake/alert Behavior During Therapy: WFL for tasks assessed/performed Overall Cognitive Status: Within Functional Limits for tasks assessed    Extremity/Trunk Assessment Upper Extremity Assessment Upper Extremity Assessment: Defer to OT evaluation RUE Deficits / Details: h/o of RUE weakness from old CVA but [facility] for ADL tasks. Lower Extremity Assessment Lower Extremity Assessment: RLE deficits/detail;LLE deficits/detail RLE Deficits / Details: mild strength deficit but grossly 4+/5, WFL for daily activity LLE Deficits / Details: unable to lift left leg against gravity due to surgery LLE: Unable to fully assess due to pain Cervical / Trunk Assessment Cervical / Trunk Assessment: Normal   Balance Balance Balance Assessed: Yes Dynamic Standing Balance Dynamic Standing - Balance Support: Bilateral upper extremity supported;During functional activity Dynamic Standing - Level of Assistance: 4: Min assist  End of Session PT - End of Session Equipment Utilized During Treatment: Gait belt Activity Tolerance: Patient tolerated treatment well;Patient limited by pain Patient left: in chair;with call bell/phone within reach;with family/visitor present Nurse Communication: Mobility status  GP   Lyanne Co, PT  Acute Rehab Services  [phone]   [place], [place] 09/04/2012, 10:09 AM

## 2012-09-04 NOTE — Progress Notes (Signed)
INITIAL NUTRITION ASSESSMENT  DOCUMENTATION CODES Per approved criteria  -Severe malnutrition in the context of chronic illness   INTERVENTION: 1. D/c Ensure  2. Resource Breeze po BID, each supplement provides 250 kcal and 9 grams of protein. 3. Recommend referral to RD at [facility] for ongoing nutrition management   NUTRITION DIAGNOSIS: Inadequate oral intake related to taste changes as evidenced by weight loss.   Goal: PO intake to meet >/=90% estimated nutrition needs.   Monitor:  PO intake, weight trends, labs, I/O's  Reason for Assessment: Malnutrition Screening Tool  62 y.o. male  Admitting Dx: <principal problem not specified>  ASSESSMENT: Pt admitted for Fem-pop bypass. With hx of lung CA.   Pt reports ongoing weight loss, likely related to chemo as pt reports is has caused taste changes. Weight now down 16 lbs in the past month (11.6% body weight). States he is only able to eat a small amount, less then 50% of his usual intake. Does not drink oral nutrition supplements at home. Had an Ensure at bedside but does not like them. Pt in pain and requesting more pain medication, physical exam deferred at this time.   Pt meets criteria for severe malnutrition in the context of chronic illness 2/2 weight loss of 11.6% body weight in the past month and meeting </=75% estimated nutrition needs for >/= 1 month.   Height: Ht Readings from Last 1 Encounters:  09/03/12 5\' 6"  (1.676 m)    Weight: Wt Readings from Last 1 Encounters:  09/03/12 121 lb 14.6 oz (55.3 kg)    Ideal Body Weight: 142 lbs   % Ideal Body Weight: 85%  Wt Readings from Last 10 Encounters:  09/03/12 121 lb 14.6 oz (55.3 kg)  09/03/12 121 lb 14.6 oz (55.3 kg)  08/29/12 121 lb 14.6 oz (55.3 kg)  08/29/12 121 lb 14.6 oz (55.3 kg)  08/29/12 121 lb 14.6 oz (55.3 kg)  08/16/12 141 lb (63.957 kg)  08/16/12 141 lb (63.957 kg)  08/15/12 137 lb 11.2 oz (62.46 kg)  08/08/12 136 lb 1.6 oz (61.735 kg)   07/25/12 137 lb 9.6 oz (62.415 kg)    Usual Body Weight: ~150 lbs, over 1 year ago   % Usual Body Weight: 81%  BMI:  Body mass index is 19.69 kg/(m^2). WNL   Estimated Nutritional Needs: Kcal: 1700-1925  Protein: 70-80 gm  Fluid: 1.7-1.9 L   Skin: multiple incision in L leg   Diet Order: Cardiac  EDUCATION NEEDS: -No education needs identified at this time   Intake/Output Summary (Last 24 hours) at 09/04/12 1506 Last data filed at 09/04/12 1300  Gross per 24 hour  Intake   3040 ml  Output   1950 ml  Net   1090 ml    Last BM: PTA    Labs:   Recent Labs Lab 08/29/12 0540 09/03/12 0718 09/03/12 1357 09/03/12 1850 09/04/12 0545  NA 132* 137 133*  --  140  K 3.6 2.9* 3.5  --  3.1*  CL 97  --  102  --  105  CO2 24  --  21  --  28  BUN 5*  --  7  --  6  CREATININE 0.60  --  0.64 0.68 0.66  CALCIUM 9.3  --  8.9  --  8.1*  GLUCOSE 112* 107* 138*  --  90    CBG (last 3)  No results found for this basename: GLUCAP,  in the last 72 hours  Scheduled Meds: .  aspirin EC  81 mg Oral Daily  . atorvastatin  20 mg Oral Daily  . ceFEPIme (MAXIPIME) 2 GM IVP  2 g Intravenous Q8H  . docusate sodium  100 mg Oral Daily  . enoxaparin (LOVENOX) injection  40 mg Subcutaneous Q24H  . feeding supplement  237 mL Oral BID BM  . gabapentin  600 mg Oral Daily  . metroNIDAZOLE  500 mg Oral Q8H  . nortriptyline  75 mg Oral QHS  . tamsulosin  0.4 mg Oral Daily  . vancomycin  1,250 mg Intravenous Q12H    Continuous Infusions: . sodium chloride 100 mL/hr at 09/03/12 1800    Past Medical History  Diagnosis Date  . BENIGN PROSTATIC HYPERTROPHY 12/24/2009  . CEREBROVASCULAR ACCIDENT, HX OF 12/24/2009  . HYPERLIPIDEMIA 12/24/2009  . HYPERTENSION 12/24/2009  . Testosterone deficiency   . ED (erectile dysfunction)   . Claudication in peripheral vascular disease, lifestyle limiting, with abnormal arterial dopplers of lower ext. 06/30/2011  . S/P angioplasty with stent to Lt. SFA  06/30/11 06/30/2011  . COPD 12/24/2009  . Shortness of breath     "from the COPD" (10/13/2011)  . Stroke 1996    "right side not as strong as left since; numbness R leg/foot" (10/13/2011)  . Pernicious anemia   . Complication of anesthesia     hard to wake up  . Lung cancer     Past Surgical History  Procedure Laterality Date  . Cervical fusion  1980's    "total of 3-4 OR's on my neck; pinched nerve" (10/13/2011)  . Aneurysm coiling  2007  . Peripheral arterial stent graft  06/30/2010; 10/13/10    left; left  . Video bronchoscopy Bilateral 04/03/2012    Procedure: VIDEO BRONCHOSCOPY WITHOUT FLUORO;  Surgeon: Nyoka Cowden, MD;  Location: [facility];  Service: Cardiopulmonary;  Laterality: Bilateral;  . Amputation Left 08/26/2012    Procedure: IRRIGATION AND DEBRIDMENT LEFT DORSAL ABSCESS/ 4TH AND 5TH LEFT RAY AMPUTATION ;  Surgeon: Toni Arthurs, MD;  Location: [facility];  Service: Orthopedics;  Laterality: Left;  . Femoral-popliteal bypass graft Left 09/03/2012    Procedure: BYPASS GRAFT FEMORAL-POPLITEAL ARTERY;  Surgeon: Nada Libman, MD;  Location: [facility] OR;  Service: Vascular;  Laterality: Left;    Clarene Duke RD, LDN Pager [phone] After Hours pager [phone]

## 2012-09-04 NOTE — Progress Notes (Signed)
Utilization review completed.  

## 2012-09-04 NOTE — Care Management Note (Signed)
    Page 1 of 2   09/06/2012     11:27:40 AM   CARE MANAGEMENT NOTE 09/06/2012  Patient:  [name], [name]   Account Number:  [id number]  Date Initiated:  09/04/2012  Documentation initiated by:  Donn Pierini  Subjective/Objective Assessment:   Pt admitted s/p fem-pop  bypass-     Action/Plan:   PTA pt lived at home with spouse- was active with [facility] for HH-RN/PT/OT - if returns home will need resumption orders for [facility]--- PT eval ordered   Anticipated DC Date:  09/06/2012   Anticipated DC Plan:  HOME W HOME HEALTH SERVICES      DC Planning Services  CM consult      [facility] Choice  Resumption Of Svcs/PTA Provider   Choice offered to / List presented to:  C-1 Patient   DME arranged  Levan Hurst      DME agency  [facility]     [facility] arranged  HH-1 RN  HH-2 PT      [facility] agency  [facility]   Status of service:  Completed, signed off Medicare Important Message given?   (If response is "NO", the following Medicare IM given date fields will be blank) Date Medicare IM given:   Date Additional Medicare IM given:    Discharge Disposition:  HOME W HOME HEALTH SERVICES  Per UR Regulation:  Reviewed for med. necessity/level of care/duration of stay  If discussed at Long Length of Stay Meetings, dates discussed:    Comments:  09/06/12 Woodward Klem,RN,BSN [phone] PT FOR DC HOME TODAY WITH SPOUSE AND HH SERVICES AS PRIOR TO ADMISSION.  [facility] NOTIFIED OF DC DATE.  WIFE STATES PT NEEDS MORE IV VANC DELIVERED, AND I HAVE NOTIFIED [facility] OF THIS.  SHE IS ALSO CONCERNED ABOUT AIR BUBBLES IN LINE WHEN DOING HOME INFUSIONS.  PAM WITH [facility] ON FLOOR TO PROVIDE EDUCATION WITH WIFE.  REFERRAL TO [facility] FOR DME, AS PT REQUESTING RW FOR HOME.

## 2012-09-04 NOTE — Progress Notes (Signed)
VASCULAR LAB PRELIMINARY  ARTERIAL  ABI completed:    RIGHT    LEFT    PRESSURE WAVEFORM  PRESSURE WAVEFORM  BRACHIAL  triphasic BRACHIAL 104 triphasic  DP   DP    AT 68 Dampened monophasic AT 93 triphasic  PT 31 Dampened monophasic PT 126 Dampened monophasic  PER   PER    GREAT TOE  NA GREAT TOE  NA    RIGHT LEFT  ABI 0.65 >1.0     Cerra Eisenhower, RVT 09/04/2012, 2:34 PM

## 2012-09-04 NOTE — Evaluation (Signed)
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 He has been receiving chemo for lung cancer, it is currently on hold. 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    Clinical Impression   Pt presenting with decreased activity tolerance and limited functional mobility due to pain in LLE. Will continue to follow acutely in order to address below problem list.  Recommending 24/7 supervision/assist upon return home.   OT Assessment  Patient needs continued OT Services    Follow Up Recommendations  No OT follow up;Supervision/Assistance - 24 hour    Barriers to Discharge      Equipment Recommendations  3 in 1 bedside comode    Recommendations for Other Services    Frequency  Min 2X/week    Precautions / Restrictions Precautions Precautions: Fall Required Braces or Orthoses: Other Brace/Splint Other Brace/Splint: post-op shoe for left foot Restrictions Weight Bearing Restrictions: No   Pertinent Vitals/Pain See vitals    ADL  Eating/Feeding: Performed;Independent Where Assessed - Eating/Feeding: Bed level;Chair Upper Body Dressing: Performed;Set up Where Assessed - Upper Body Dressing: Unsupported sitting Lower Body Dressing: Performed;Maximal assistance Where Assessed - Lower Body Dressing: Unsupported sitting Toilet Transfer: Simulated;Minimal assistance Toilet Transfer Method: Sit to stand (ambulating) Toilet Transfer Equipment:  (bed ambulating to chair) Equipment Used: Gait belt (post op shoe for left foot due to ray amputation) Transfers/Ambulation Related to ADLs: Min assist with RW.  Incr time due to pain. ADL Comments: Pt reports he is typically able to lean  forward and don shoes/socks.  Has been wearing left post op shoe since 4th and 5th ray amputation with tennis shoe on right foot.  Pt requiring increased assist with LB dressing due to LLE pain.    OT Diagnosis: Generalized weakness;Acute pain  OT Problem List: Decreased strength;Decreased activity tolerance;Impaired balance (sitting and/or standing);Decreased knowledge of use of DME or AE;Pain OT Treatment Interventions: Self-care/ADL training;DME and/or AE instruction;Therapeutic activities;Balance training;Patient/family education   OT Goals(Current goals can be found in the care plan section) Acute Rehab OT Goals Patient Stated Goal: to return home OT Goal Formulation: With patient Time For Goal Achievement: 09/11/12 Potential to Achieve Goals: Good  Visit Information  Last OT Received On: 09/04/12 Assistance Needed: +1 PT/OT Co-Evaluation/Treatment: Yes History of Present Illness: 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 He has been receiving chemo for lung cancer, it is currently on hold. 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        Prior Functioning     Home Living Family/patient expects to be discharged to:: Private residence Living Arrangements: Spouse/significant other Available Help at Discharge: Family;Available 24 hours/day Type of Home: House Home Access: Stairs to enter [employer] of Steps: 3 Entrance Stairs-Rails: Right Home Layout: One level Home Equipment: Wheelchair - manual;Cane - single point Additional Comments: pt's wife works during the day but she reports that family can be with him while she is gone and had been with him PTA Prior Function Level of Independence: Independent Communication Communication: No difficulties Dominant Hand: Right         Vision/Perception     Cognition  Cognition Arousal/Alertness: Awake/alert Behavior During Therapy:  WFL for tasks assessed/performed Overall Cognitive Status: Within Functional Limits for tasks assessed    Extremity/Trunk Assessment Upper Extremity Assessment Upper Extremity Assessment: Defer to OT evaluation RUE Deficits / Details: h/o of RUE weakness from old CVA but [facility] for ADL tasks. Lower Extremity Assessment Lower Extremity Assessment: RLE deficits/detail;LLE deficits/detail RLE Deficits / Details: mild strength deficit but grossly 4+/5, WFL for daily activity LLE Deficits / Details: unable to lift left leg against gravity due to surgery LLE: Unable to fully assess due to pain Cervical / Trunk Assessment Cervical / Trunk Assessment: Normal     Mobility Bed Mobility Bed Mobility: Supine to Sit;Sitting - Scoot to Edge of Bed Supine to Sit: 4: Min assist Sitting - Scoot to [employer] of Bed: 4: Min assist Details for Bed Mobility Assistance: min A to left leg, pt very fearful of mvmt because he felt that incisions were tearing open. Incisions checked and in tact, pt encouraged Transfers Transfers: Sit to Stand;Stand to Sit Sit to Stand: 4: Min assist;From bed Stand to Sit: 4: Min assist;To chair/3-in-1 Details for Transfer Assistance: min A to steady and due to pain in left leg. Post-op shoe placed on left foot and tennis shoe on right to balance him     Exercise     Balance Balance Balance Assessed: Yes Dynamic Standing Balance Dynamic Standing - Balance Support: Bilateral upper extremity supported;During functional activity Dynamic Standing - Level of Assistance: 4: Min assist   End of Session OT - End of Session Equipment Utilized During Treatment: Rolling walker;Gait belt Activity Tolerance: Patient tolerated treatment well Patient left: in chair;with call bell/phone within reach;with family/visitor present Nurse Communication: Mobility status  GO   09/04/2012 Cipriano Mile OTR/L Pager [phone] Office [phone]   Cipriano Mile 09/04/2012, 11:05  AM

## 2012-09-04 NOTE — Progress Notes (Signed)
[facility] [place]  Subjective  - POD #1, s/p left fem AK pop BPG with PTFE  Feels good this am.  Pain controlled   Physical Exam:  Multiphasic DP signal in left Incisions clean       Assessment/Plan:  POD #1  D/c foley Transfer to 2000 OOB with PT Left foot per Dr. Victorino Dike IV abx per ID  BRABHAM IV, V. WELLS 09/04/2012 8:31 AM --  Ceasar Mons Vitals:   09/04/12 0747  BP: 138/64  Pulse:   Temp: 99.2 F (37.3 C)  Resp:     Intake/Output Summary (Last 24 hours) at 09/04/12 0831 Last data filed at 09/04/12 0720  Gross per 24 hour  Intake   4280 ml  Output   2905 ml  Net   1375 ml     Laboratory CBC    Component Value Date/Time   WBC 6.6 09/04/2012 0545   WBC 2.7 Repeated and Verified* 08/15/2012 1117   HGB 7.6* 09/04/2012 0545   HGB 8.3 Repeated and Verified* 08/15/2012 1117   HCT 22.5* 09/04/2012 0545   HCT 24.2* 08/15/2012 1117   PLT 279 09/04/2012 0545   PLT 187 08/15/2012 1117    BMET    Component Value Date/Time   NA 140 09/04/2012 0545   NA 135* 08/15/2012 1117   K 3.1* 09/04/2012 0545   K 3.4* 08/15/2012 1117   CL 105 09/04/2012 0545   CL 97* 06/13/2012 1300   CO2 28 09/04/2012 0545   CO2 25 08/15/2012 1117   GLUCOSE 90 09/04/2012 0545   GLUCOSE 115 08/15/2012 1117   GLUCOSE 123* 06/13/2012 1300   BUN 6 09/04/2012 0545   BUN 7.1 08/15/2012 1117   CREATININE 0.66 09/04/2012 0545   CREATININE 0.7 08/15/2012 1117   CALCIUM 8.1* 09/04/2012 0545   CALCIUM 9.0 08/15/2012 1117   GFRNONAA >90 09/04/2012 0545   GFRAA >90 09/04/2012 0545    COAG Lab Results  Component Value Date   INR 1.13 08/26/2012   No results found for this basename: PTT    Antibiotics Anti-infectives   Start     Dose/Rate Route Frequency Ordered Stop   09/03/12 2000  vancomycin (VANCOCIN) 1,250 mg in sodium chloride 0.9 % 250 mL IVPB     1,250 mg 166.7 mL/hr over 90 Minutes Intravenous Every 12 hours 09/03/12 1804     09/03/12 1800  ceFEPIme (MAXIPIME) 2 g in  dextrose 5 % 50 mL IVPB     2 g 100 mL/hr over 30 Minutes Intravenous Every 8 hours 09/03/12 1728     09/03/12 1800  metroNIDAZOLE (FLAGYL) tablet 500 mg     500 mg Oral Every 8 hours 09/03/12 1728     09/03/12 1200  cefUROXime (ZINACEF) 1.5 g in dextrose 5 % 50 mL IVPB  Status:  Discontinued     1.5 g 100 mL/hr over 30 Minutes Intravenous To Surgery 09/03/12 1147 09/03/12 1715   09/02/12 1425  cefUROXime (ZINACEF) 1.5 g in dextrose 5 % 50 mL IVPB     1.5 g 100 mL/hr over 30 Minutes Intravenous 30 min pre-op 09/02/12 1425 09/03/12 1151       V. Charlena Cross, M.D. [facility] [place] Office: [phone] Pager:  [phone]

## 2012-09-04 NOTE — Progress Notes (Signed)
Patient being transferred to 2W per MD order, Report called to [place], RN.Patient will transfer in wheel chair, wife at bedside.

## 2012-09-05 ENCOUNTER — Ambulatory Visit: Payer: Medicare Other

## 2012-09-05 ENCOUNTER — Telehealth: Payer: Self-pay | Admitting: Dietician

## 2012-09-05 ENCOUNTER — Other Ambulatory Visit: Payer: Medicare Other | Admitting: Lab

## 2012-09-05 LAB — BASIC METABOLIC PANEL
BUN: 10 mg/dL (ref 6–23)
CO2: 24 mEq/L (ref 19–32)
Chloride: 104 mEq/L (ref 96–112)
Creatinine, Ser: 0.72 mg/dL (ref 0.50–1.35)
Glucose, Bld: 112 mg/dL — ABNORMAL HIGH (ref 70–99)

## 2012-09-05 LAB — GLUCOSE, CAPILLARY
Glucose-Capillary: 127 mg/dL — ABNORMAL HIGH (ref 70–99)
Glucose-Capillary: 141 mg/dL — ABNORMAL HIGH (ref 70–99)

## 2012-09-05 LAB — HEMOGLOBIN AND HEMATOCRIT, BLOOD: Hemoglobin: 6.5 g/dL — CL (ref 13.0–17.0)

## 2012-09-05 MED ORDER — POTASSIUM CHLORIDE CRYS ER 20 MEQ PO TBCR
30.0000 meq | EXTENDED_RELEASE_TABLET | Freq: Once | ORAL | Status: AC
  Administered 2012-09-05: 30 meq via ORAL
  Filled 2012-09-05: qty 1

## 2012-09-05 NOTE — Progress Notes (Signed)
CRITICAL VALUE ALERT  Critical value received:  Hem 6.5  Date of notification:  09/05/12  Time of notification:  5:52  Critical value read back: YES  Nurse who received alert:  Rosanne Sack RN  MD notified (1st page):  Vasc. Surg. Answering Service  Time of first page:  0555   MD notified (2nd page): Hart Rochester MD  Time of second page: 0600  MD notified (3rd page): Vasc. Surg. Answering Service  Time of third page: 0615  MD notified (4th page): Hart Rochester MD  Time of fourth page: 0630  Pt stable and comfortable. Will pass along critical value to day shift.

## 2012-09-05 NOTE — Progress Notes (Signed)
PT Cancellation Note  Patient Details Name: [name] MRN: [id number] DOB: May 12, 1950   Cancelled Treatment:    Reason Eval/Treat Not Completed: Medical issues which prohibited therapy.  Hgb 6.5 and BP this morning in bed low 115/45.  PT will await MD recs.  Per department policy this Hgb level warrants holding pt's treatment today.  PT will check back tomorrow.     [name] [name], PT, DPT [phone]   09/05/2012, 9:59 AM

## 2012-09-05 NOTE — Progress Notes (Addendum)
[facility] [place]  Progress Note Bypass Surgery  Date of Surgery: 09/03/2012  Procedure(s): LEFT BYPASS GRAFT FEMORAL-POPLITEAL ARTERY Surgeon: Surgeon(s): Nada Libman, MD  2 Days Post-Op  History of Present Illness  [name] is a 62 y.o. male who is  2 Days Post-Op. The patient's pre-op symptoms  are Improved . Patients pain is well controlled. "sore when I try to get up". Pt feeling slightly lightheaded, H/H down today. Pt states he gets transfusion at cancer center when HGB <7 VASC. LAB Studies:        ABI: Right 0.65;  Left >1;   CBC Lab Results  Component Value Date   WBC 6.6 09/04/2012   HGB 6.5* 09/05/2012   HCT 18.8* 09/05/2012   MCV 93.0 09/04/2012   PLT 279 09/04/2012    BMET    Component Value Date/Time   NA 134* 09/05/2012 0500   NA 135* 08/15/2012 1117   K 3.3* 09/05/2012 0500   K 3.4* 08/15/2012 1117   CL 104 09/05/2012 0500   CL 97* 06/13/2012 1300   CO2 24 09/05/2012 0500   CO2 25 08/15/2012 1117   GLUCOSE 112* 09/05/2012 0500   GLUCOSE 115 08/15/2012 1117   GLUCOSE 123* 06/13/2012 1300   BUN 10 09/05/2012 0500   BUN 7.1 08/15/2012 1117   CREATININE 0.72 09/05/2012 0500   CREATININE 0.7 08/15/2012 1117   CALCIUM 7.9* 09/05/2012 0500   CALCIUM 9.0 08/15/2012 1117   GFRNONAA >90 09/05/2012 0500   GFRAA >90 09/05/2012 0500    COAG Lab Results  Component Value Date   INR 1.13 08/26/2012   No results found for this basename: PTT    Physical Examination  BP Readings from Last 3 Encounters:  09/05/12 115/48  09/05/12 115/48  08/29/12 140/50   Temp Readings from Last 3 Encounters:  09/05/12 98.4 F (36.9 C) Oral  09/05/12 98.4 F (36.9 C) Oral  08/29/12 98.2 F (36.8 C) Oral   SpO2 Readings from Last 3 Encounters:  09/05/12 93%  09/05/12 93%  08/29/12 93%   Pulse Readings from Last 3 Encounters:  09/05/12 114  09/05/12 114  08/29/12 98    Pt is A&O x 3 left lower extremity: Incision/s is/are clean,dry.intact, and   healing without hematoma, erythema or drainage Limb is warm; with good color  Assessment/Plan: Pt. Doing well Post-op pain is controlled Wounds are healing well PT/OT for ambulation Continue wound care as ordered Post-op acute blood loss on chronic anemia - will transfuse 2UPC Hypokalemia - replace Labs in am Change dressing to left foot.  [name]  09/05/2012 9:58 AM        The patient is doing very well following his bypass surgery. Pending his ability to ambulate, he'll most likely be able to be discharged home tomorrow with followup with Dr. Victorino Dike for ongoing care of his toe amputation site  [name]   Pt has been on Vanc/ cefepime/flagyl per ID and will resume at home to continue 4-6 week course

## 2012-09-06 ENCOUNTER — Telehealth: Payer: Self-pay | Admitting: Surgery

## 2012-09-06 LAB — TYPE AND SCREEN: Unit division: 0

## 2012-09-06 LAB — CBC
MCH: 30.9 pg (ref 26.0–34.0)
MCHC: 34.2 g/dL (ref 30.0–36.0)
MCV: 90.4 fL (ref 78.0–100.0)
Platelets: 276 10*3/uL (ref 150–400)
RBC: 2.91 MIL/uL — ABNORMAL LOW (ref 4.22–5.81)
RDW: 17.4 % — ABNORMAL HIGH (ref 11.5–15.5)

## 2012-09-06 LAB — BASIC METABOLIC PANEL
Calcium: 8 mg/dL — ABNORMAL LOW (ref 8.4–10.5)
Creatinine, Ser: 0.64 mg/dL (ref 0.50–1.35)
GFR calc Af Amer: >90 mL/min (ref >90)
GFR calc non Af Amer: >90 mL/min (ref >90)
Sodium: 131 mEq/L — ABNORMAL LOW (ref 135–145)

## 2012-09-06 MED ORDER — DEXTROSE 5 % IV SOLN: 2.0000 g | Freq: Three times a day (TID) | INTRAVENOUS | Status: DC

## 2012-09-06 MED ORDER — POTASSIUM CHLORIDE CRYS ER 20 MEQ PO TBCR
40.0000 meq | EXTENDED_RELEASE_TABLET | Freq: Once | ORAL | Status: AC
  Administered 2012-09-06: 40 meq via ORAL
  Filled 2012-09-06: qty 2

## 2012-09-06 MED ORDER — VANCOMYCIN HCL 10 G IV SOLR: 1250.0000 mg | Freq: Two times a day (BID) | INTRAVENOUS | Status: DC

## 2012-09-06 MED ORDER — OXYCODONE-ACETAMINOPHEN 5-325 MG PO TABS: 1.0000 | ORAL_TABLET | ORAL | Status: DC | PRN

## 2012-09-06 MED ORDER — SODIUM CHLORIDE 0.9 % IJ SOLN
10.0000 mL | INTRAMUSCULAR | Status: DC | PRN
  Administered 2012-09-06 (×2): 10 mL

## 2012-09-06 MED ORDER — HEPARIN SOD (PORK) LOCK FLUSH 100 UNIT/ML IV SOLN
250.0000 [IU] | INTRAVENOUS | Status: AC | PRN
  Administered 2012-09-06: 250 [IU]

## 2012-09-06 NOTE — Progress Notes (Signed)
Discharge instructions along with med list and scripts provided. PICC flushed/disconnected per IV team. [facility] representative to see patient and reviewed care at home. Patient provided wheelchair and Darco boot for home care. Patient transported via wheel chair to lobby per volunteer staff. Belongings with patient and wife. [name]

## 2012-09-06 NOTE — Discharge Summary (Signed)
[facility] Discharge Summary   Patient ID:  [name] MRN: [id number] DOB/AGE: Jan 24, 1950 62 y.o.  Admit date: 09/03/2012 Discharge date: 09/06/2012 Date of Surgery: 09/03/2012 Surgeon: Surgeon(s): Nada Libman, MD  Admission Diagnosis: PVD;GANGRENE FOOTLEFT  Discharge Diagnoses:  PVD;GANGRENE FOOTLEFT  Secondary Diagnoses: Past Medical History  Diagnosis Date  . BENIGN PROSTATIC HYPERTROPHY 12/24/2009  . CEREBROVASCULAR ACCIDENT, HX OF 12/24/2009  . HYPERLIPIDEMIA 12/24/2009  . HYPERTENSION 12/24/2009  . Testosterone deficiency   . ED (erectile dysfunction)   . Claudication in peripheral vascular disease, lifestyle limiting, with abnormal arterial dopplers of lower ext. 06/30/2011  . S/P angioplasty with stent to Lt. SFA 06/30/11 06/30/2011  . COPD 12/24/2009  . Shortness of breath     "from the COPD" (10/13/2011)  . Stroke 1996    "right side not as strong as left since; numbness R leg/foot" (10/13/2011)  . Pernicious anemia   . Complication of anesthesia     hard to wake up  . Lung cancer     Procedure(s):Left femoral to above-knee popliteal artery bypass with 6 mm propatent PTFE  #2: [place], left great saphenous vein  Discharged Condition: good  HPI: [name] is a 62 y.o. male who was admitted with a left 5th toe ulcer, present for several weeks. He underwent 4th and 5th ray amputation by Dr. Victorino Dike. MRI suggested early osteo and cellulitis. He suffers from PVD and has previously undergone atherectomy and angioplasty of his left SFA in 2012. In 2013 he required stenting for a total occlusion. He also underwent additional atherectomy later in 2013. He underwent angiogram earlier today which revealed an occluded SFA with reconstitution of the above knee popliteal artery with single vessel runoff via the peroneal artery.  The patient is currently undergoing chemotherapy for stage IV lung cancer, dx'd in March 2014. The patient suffered a stroke in  2013 with residual right sided weakness. He is medically managed for his hypercholesterolemia with a statin. He is also treated for HTN with an ACE inhibitor. He is an active smoker.  I have been asked to consult for possible surgical bypass due to concerns of healing his toe amputation.  His chances of wound healing would be much higher with revascularization. Pt was admitted for left fem-pop bypass after being cleared medically.    Hospital Course:  [name] is a 62 y.o. male is S/P Left Procedure(s): BYPASS GRAFT FEMORAL-POPLITEAL ARTERY Extubated: POD # 0 Physical exam: left leg incisions healing, leg and foot are warm with doppler signal left DP Post-op wounds healing well Pt. Ambulating, voiding and taking PO diet without difficulty. Pt pain controlled with PO pain meds. Labs as below Complications:acute blood loss on chronic anemia with HGB 6.5. Pt symptomatic. Pt given 2UPC with repeat HGB 9.0 Hypokalemia - replaced with po potassium  Consults:     Significant Diagnostic Studies: CBC Lab Results  Component Value Date   WBC 7.3 09/06/2012   HGB 9.0* 09/06/2012   HCT 26.3* 09/06/2012   MCV 90.4 09/06/2012   PLT 276 09/06/2012    BMET    Component Value Date/Time   NA 131* 09/06/2012 0521   NA 135* 08/15/2012 1117   K 3.1* 09/06/2012 0521   K 3.4* 08/15/2012 1117   CL 101 09/06/2012 0521   CL 97* 06/13/2012 1300   CO2 25 09/06/2012 0521   CO2 25 08/15/2012 1117   GLUCOSE 99 09/06/2012 0521   GLUCOSE 115 08/15/2012 1117   GLUCOSE  123* 06/13/2012 1300   BUN 8 09/06/2012 0521   BUN 7.1 08/15/2012 1117   CREATININE 0.64 09/06/2012 0521   CREATININE 0.7 08/15/2012 1117   CALCIUM 8.0* 09/06/2012 0521   CALCIUM 9.0 08/15/2012 1117   GFRNONAA >90 09/06/2012 0521   GFRAA >90 09/06/2012 0521   COAG Lab Results  Component Value Date   INR 1.13 08/26/2012     Disposition:  Discharge to :Home Discharge Orders   Future Appointments Provider Department Dept Phone   09/18/2012  4:30 PM Randall Hiss, MD [facility] [phone]   12/06/2012 8:30 AM Gordy Savers, MD [facility] [phone]   Future Orders Complete By Expires   Call MD for:  redness, tenderness, or signs of infection (pain, swelling, bleeding, redness, odor or green/yellow discharge around incision site)  As directed    Call MD for:  severe or increased pain, loss or decreased feeling  in affected limb(s)  As directed    Call MD for:  temperature >100.5  As directed    Change dressing (specify)  As directed    Comments:     Dressing change: to left foot daily, may leave dressings off of leg incisions   Increase activity slowly  As directed    Comments:     Walk with assistance use walker or cane as needed   may wash over wound with mild soap and water  As directed    Resume previous diet  As directed        Medication List         acetaminophen 500 MG tablet  Commonly known as:  TYLENOL  Take 1,000 mg by mouth every 6 (six) hours as needed for pain.     aspirin 81 MG tablet  Take 81 mg by mouth daily.     atorvastatin 20 MG tablet  Commonly known as:  LIPITOR  Take 20 mg by mouth daily.     budesonide-formoterol 160-4.5 MCG/ACT inhaler  Commonly known as:  SYMBICORT  Inhale 2 puffs into the lungs 2 (two) times daily as needed (for shortness of breath).     dextrose 5 % SOLN 50 mL with ceFEPIme 2 G SOLR 2 g  Inject 2 g into the vein every 8 (eight) hours.     dextrose 5 % SOLN 50 mL with ceFEPIme 2 G SOLR 2 g  Inject 2 g into the vein every 8 (eight) hours.     feeding supplement Liqd  Take 237 mL by mouth 2 (two) times daily between meals.     gabapentin 600 MG tablet  Commonly known as:  NEURONTIN  Take 600 mg by mouth every morning.     lisinopril 20 MG tablet  Commonly known as:  PRINIVIL,ZESTRIL  Take 20 mg by mouth daily as needed (as needed for increased blood pressure.).     metroNIDAZOLE 500 MG  tablet  Commonly known as:  FLAGYL  Take 1 tablet (500 mg total) by mouth every 8 (eight) hours.     nortriptyline 75 MG capsule  Commonly known as:  PAMELOR  Take 75 mg by mouth at bedtime.     oxyCODONE-acetaminophen 5-325 MG per tablet  Commonly known as:  PERCOCET/ROXICET  Take 1-2 tablets by mouth every 4 (four) hours as needed.     prochlorperazine 10 MG tablet  Commonly known as:  COMPAZINE  Take 10 mg by mouth every 6 (six) hours as needed (for  nausea).     sodium chloride 0.9 % SOLN 250 mL with vancomycin 10 G SOLR 1,250 mg  Inject 1,250 mg into the vein every 12 (twelve) hours. Dose to be monitored by outpatient pharmacy and ID physician, total 21 day course     sodium chloride 0.9 % SOLN 250 mL with vancomycin 10 G SOLR 1,250 mg  Inject 1,250 mg into the vein every 12 (twelve) hours.     tamsulosin 0.4 MG Caps capsule  Commonly known as:  FLOMAX  Take 0.4 mg by mouth daily.        Verbal and written Discharge instructions given to the patient. Wound care per Discharge AVS     Follow-up Information   Follow up with Myra Gianotti IV, Lala Lund, MD In 4 weeks. (office will arrange)    Specialty:  Vascular Surgery   Contact information:   [address] [place] [address] [phone]       Signed: Marlowe Shores 09/06/2012, 11:07 AM  - For VQI Registry use --- Instructions: Press F2 to tab through selections.  Delete question if not applicable.   Post-op:  Wound infection: No  Graft infection: No  Transfusion: No   New Arrhythmia: No Ipsilateral amputation: [x ] no, [ ]  Minor, [ ]  BKA, [ ]  AKA Discharge patency: [x ] Primary, [ ]  Primary assisted, [ ]  Secondary, [ ]  Occluded Patency judged by: [x ] Dopper only, [ ]  Palpable graft pulse, [ ]  Palpable distal pulse, [ ]  ABI inc. > 0.15, [ ]  Duplex Discharge ABI: R 0.63, L 0.53  D/C Ambulatory Status: Ambulatory with Assistance  Complications: MI: [x ] No, [ ]  Troponin only, [ ]  EKG or Clinical CHF:  No Resp failure: [x ] none, [ ]  Pneumonia, [ ]  Ventilator Chg in renal function: [x ] none, [ ]  Inc. Cr > 0.5, [ ]  Temp. Dialysis, [ ]  Permanent dialysis Stroke: [x ] None, [ ]  Minor, [ ]  Major Return to OR: No  Reason for return to OR: [ ]  Bleeding, [ ]  Infection, [ ]  Thrombosis, [ ]  Revision  Discharge medications: Statin use:  Yes ASA use:  Yes Plavix use: no Beta blocker use: no Coumadin use: no

## 2012-09-06 NOTE — Telephone Encounter (Signed)
Message copied by Jena Gauss on Fri Sep 06, 2012  3:13 PM ------      Message from: Marlowe Shores      Created: Fri Sep 06, 2012 10:49 AM       4 week f/u bypass - brab ------

## 2012-09-06 NOTE — Progress Notes (Addendum)
ANTIBIOTIC CONSULT NOTE - Follow Up Pharmacy Consult for Vancomycin Indication: osteomyelitis  No Known Allergies  Patient Measurements: Height: 5\' 6"  (167.6 cm) Weight: 121 lb 14.6 oz (55.3 kg) IBW/kg (Calculated) : 63.8  Vital Signs: Temp: 98 F (36.7 C) (08/22 0442) Temp src: Oral (08/22 0442) BP: 116/70 mmHg (08/22 0442) Pulse Rate: 103 (08/22 0442)  Intake/Output from this shift: Total I/O In: -  Out: 300 [Urine:300]  Labs:  Recent Labs  09/03/12 1850 09/04/12 0545 09/05/12 0500 09/06/12 0521  WBC 7.0 6.6  --  7.3  HGB 8.4* 7.6* 6.5* 9.0*  PLT 280 279  --  276  CREATININE 0.68 0.66 0.72 0.64   Estimated Creatinine Clearance: 73.9 ml/min (by C-G formula based on Cr of 0.64).  Medical History: Past Medical History  Diagnosis Date  . BENIGN PROSTATIC HYPERTROPHY 12/24/2009  . CEREBROVASCULAR ACCIDENT, HX OF 12/24/2009  . HYPERLIPIDEMIA 12/24/2009  . HYPERTENSION 12/24/2009  . Testosterone deficiency   . ED (erectile dysfunction)   . Claudication in peripheral vascular disease, lifestyle limiting, with abnormal arterial dopplers of lower ext. 06/30/2011  . S/P angioplasty with stent to Lt. SFA 06/30/11 06/30/2011  . COPD 12/24/2009  . Shortness of breath     "from the COPD" (10/13/2011)  . Stroke 1996    "right side not as strong as left since; numbness R leg/foot" (10/13/2011)  . Pernicious anemia   . Complication of anesthesia     hard to wake up  . Lung cancer    62 yo M admitted 09/03/2012 w 5th toe ulcer x several weeks MRI suggested early osteo and cellulitis. Hx toe amputation x 2 (4th & 5th left Ray amputation 08/26/12) gangrene, PVD now s/p FPBG 09/03/12. Pharmacy consulted to continue vancomycin  PMH: Non healing foot ulcer: now s/p left 4th & 5th Ray amputation and I&D of foot absess on 08/26/12; PVD, BPH, HLD, PAD, lung CA (active chemo), HTN, COPD,   Events; s/p 2 units yesterday, Hgb up  Coag: VTE Px: enoxaparin; now s/p 2 units  ID - gangrene/osteo   (History: PTA Last Vancomycin dose was 8/18 pm. Has been getting Vancomycin at 8am/8pm. Dose has not been changed since discharged; Vanc during 8/7-8/14; . Plan was for 4-6 weeks antibiotics from 8/11 (through 10/07/12. No organisms isolated.) DCd on 8/14 on Vanc/cefepime/flag per ID (see 8/13 note).   Vanc begun 8/7 (plan 4-6wks) - dose incr from 1gm to 1250 mg IV q12hrs on 8/10; Tr 8/10 = 10.7 mcg/ml; Tr 8/14 = 19.7 mcg/ml Zosyn 8/6>>8/13 Cefepime 8/13>> Flagyl PO 8/13>> Zinacef 8/19>>8/19 (2 doses in OR)  8/19 - MRSA screen neg 8/11 - L foot tissue cx - neg  Cardiovascular - HTN, HLD, PVD: asa, atorva, BP < goal, HR 100-100s Neurology - stroke 2013, resid L-sided weakness; PVD: gabapentin, nortriptyline Neph/Uro - Scr 0.64 crcl ~ 70-75, K 3.3; BPH: tamsulosin Pulmonary -COPD, active smoker Hematology / Oncology - stage IV lung cancer, dx'd March 2014 - recent chemo, (Abraxanxe x 4 cycles completed, 5th started 7/17, not completed, Dr. Gwenyth Bouillon; off d/t infxn), ; Hgb trend down 6.5, req 2 units 8.21 PTA Medication Issues: Home medications not ordered: Lisinopril Best Practices: VTE Px: enoxaparin Goal of Therapy:  Vancomycin trough level 15-20 mcg/ml  Plan:    Continue Vancomycin 1250 mg IV q12hrs. Will check weekly trough with AM labs 8/23   Continue Cefepime 2 grams IV q8 hrs and Flagyl 500 mg PO q8hrs.  Thank you for allowing pharmacy to be  a part of this patients care team.  Lovenia Kim Pharm.D., BCPS Clinical Pharmacist 09/06/2012 10:22 AM Pager: [phone] Phone: [phone]  11:15 AM Plan for DC home today.  Communication via Case Management for vancomycin trough early next week as outpatient

## 2012-09-06 NOTE — Progress Notes (Signed)
Left foot dressing changed per order. Patient tolerated well. OOB to chair. Wife to bedside. Call bell near.[name]

## 2012-09-06 NOTE — Progress Notes (Signed)
Physical Therapy Treatment Patient Details Name: [name] MRN: [id number] DOB: 09/19/1950 Today's Date: 09/06/2012 Time: 4098-1191 PT Time Calculation (min): 29 min  PT Assessment / Plan / Recommendation  History of Present Illness 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 He has been receiving chemo for lung cancer, it is currently on hold. 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    PT Comments   Pt with excellent progression with mobility and gait. Pt able to don sock and shoe without assist. MD present during session and requested Darco shoe to decrease weight bearing on toes. Pt educated for gait, stairs and HEP and safe for discharge.   Follow Up Recommendations  Supervision for mobility/OOB;Home health PT     Does the patient have the potential to tolerate intense rehabilitation     Barriers to Discharge        Equipment Recommendations       Recommendations for Other Services    Frequency     Progress towards PT Goals Progress towards PT goals: Goals met and updated - see care plan  Plan Current plan remains appropriate    Precautions / Restrictions Precautions Precautions: Fall Other Brace/Splint: post-op shoe for left foot   Pertinent Vitals/Pain 4/10 pain LLE    Mobility  Bed Mobility Bed Mobility: Supine to Sit Supine to Sit: 6: Modified independent (Device/Increase time);With rails;HOB flat Details for Bed Mobility Assistance: increased time to complete but no physical assist required Transfers Sit to Stand: 5: Supervision;From bed;From toilet Stand to Sit: 5: Supervision;To toilet;To chair/3-in-1 Details for Transfer Assistance: cueing for hand placement, safety and sliding left foot forward first to decrease pain with sitting Ambulation/Gait Ambulation/Gait Assistance: 4: Min guard Ambulation Distance (Feet): 300 Feet Assistive device: Rolling walker Ambulation/Gait  Assistance Details: cueing for posture, position in RW and sequence to put weight back on left heel  Gait Pattern: Step-to pattern;Decreased stance time - left Gait velocity: decreased Stairs: Yes Stairs Assistance: 5: Supervision Stairs Assistance Details (indicate cue type and reason): cueing for sequence Stair Management Technique: Forwards;Two rails;Step to pattern Number of Stairs: 3    Exercises General Exercises - Lower Extremity Long Arc Quad: AROM;10 reps;Left;Seated Toe Raises: AROM;10 reps;Left;Seated   PT Diagnosis:    PT Problem List:   PT Treatment Interventions:     PT Goals (current goals can now be found in the care plan section)    Visit Information  Last PT Received On: 09/06/12 Assistance Needed: +1 History of Present Illness: 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 He has been receiving chemo for lung cancer, it is currently on hold. 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     Subjective Data      Cognition  Cognition Arousal/Alertness: Awake/alert Behavior During Therapy: WFL for tasks assessed/performed Overall Cognitive Status: Within Functional Limits for tasks assessed    Balance     End of Session PT - End of Session Equipment Utilized During Treatment: Gait belt Activity Tolerance: Patient tolerated treatment well Patient left: in chair;with call bell/phone within reach;with family/visitor present Nurse Communication: Mobility status   GP     [name] 09/06/2012, 12:16 PM [name], PT [phone]

## 2012-09-10 ENCOUNTER — Telehealth: Payer: Self-pay

## 2012-09-10 NOTE — Telephone Encounter (Signed)
Pt's. Wife called to request "2nd opinion" regarding possible (L) BK amputation.  Stated pt's. toe amputation site did not look as good recently, so he was evaluated by Dr. Victorino Dike yesterday.  Stated  "Dr. Victorino Dike recommended a Left BKA, due to poor circulation and gangrene."  Reports pt. is on 2 IV antibiotics through his PICC line, and taking one oral antibiotic.  Wife and pt. request to get 2nd opinion from Dr. Myra Gianotti re: need to do the Left BKA.   Advised pt. that Dr. Myra Gianotti isn't available in the office until 09/23/12.  Wife stated "I don't think he can wait that long to have the amputation."  Will make Dr. Myra Gianotti aware of above request.

## 2012-09-11 ENCOUNTER — Encounter (HOSPITAL_COMMUNITY): Payer: Self-pay | Admitting: Pharmacy Technician

## 2012-09-11 NOTE — Discharge Summary (Signed)
I agree with the above  [name] 

## 2012-09-11 NOTE — Telephone Encounter (Signed)
Rec'd recommendation per Dr. Myra Gianotti to schedule pt's appt. when he is in office next, on 09/23/12.  Called pt's wife.  Appt. given for 09/23/12 @ 1:45 PM.  Wife verb. understanding.  Reports that pt. has appt.. with Dr. Victorino Dike tomorrow, to recheck his foot, and determine if pt. can wait for further surgical treatment of right lower extremity.   Wife will contact office if pt. has to proceed with surgery with Dr. Victorino Dike.

## 2012-09-18 ENCOUNTER — Ambulatory Visit (INDEPENDENT_AMBULATORY_CARE_PROVIDER_SITE_OTHER): Payer: Medicare Other | Admitting: Infectious Disease

## 2012-09-18 ENCOUNTER — Encounter: Payer: Self-pay | Admitting: Infectious Disease

## 2012-09-18 VITALS — BP 112/70 | HR 118 | Temp 99.0°F | Wt 134.0 lb

## 2012-09-18 DIAGNOSIS — I96 Gangrene, not elsewhere classified: Secondary | ICD-10-CM

## 2012-09-18 DIAGNOSIS — I739 Peripheral vascular disease, unspecified: Secondary | ICD-10-CM

## 2012-09-18 DIAGNOSIS — M9080 Osteopathy in diseases classified elsewhere, unspecified site: Secondary | ICD-10-CM

## 2012-09-18 DIAGNOSIS — M869 Osteomyelitis, unspecified: Secondary | ICD-10-CM

## 2012-09-18 DIAGNOSIS — E1169 Type 2 diabetes mellitus with other specified complication: Secondary | ICD-10-CM

## 2012-09-18 DIAGNOSIS — F17200 Nicotine dependence, unspecified, uncomplicated: Secondary | ICD-10-CM

## 2012-09-18 NOTE — Patient Instructions (Addendum)
rtc on the 23rd at 345pm  In the meantime continue IV vancomycin, cefepime and oral flagyl

## 2012-09-18 NOTE — Progress Notes (Signed)
  Subjective:    Patient ID: [name], male    DOB: [birth year], 62 y.o.   MRN: [id number]  HPI  62 y.o. male with Gangrene, abscess, osteomyelitis sp 4th, 5th ray amputaion and I and D of foot abscess. Cultures were no growth (on abx). There were dense major revascularization percutaneously and ultimately the patient underwent vascular surgery on 09/03/12 with:  Left femoral to above-knee popliteal artery bypass with 6 mm propatent PTFE  #2: Harvest, left great saphenous vein  He has continued on IV vancomycin and cefepime and flagyl since then. His wound bed still appears necrotic and there had been apparently discussion of more proximal amputation by Dr. Victorino Dike per pt and wife but apparently now plan is for wound vacuum application. I am bothered by persistent black necrotic tissue in bed.   Review of Systems  Constitutional: Negative for fever, chills, diaphoresis, activity change, appetite change, fatigue and unexpected weight change.  HENT: Negative for congestion, sore throat, rhinorrhea, sneezing, trouble swallowing and sinus pressure.   Eyes: Negative for photophobia and visual disturbance.  Respiratory: Negative for cough, chest tightness, shortness of breath, wheezing and stridor.   Cardiovascular: Negative for chest pain, palpitations and leg swelling.  Gastrointestinal: Negative for nausea, vomiting, abdominal pain, diarrhea, constipation, blood in stool, abdominal distention and anal bleeding.  Genitourinary: Negative for dysuria, hematuria, flank pain and difficulty urinating.  Musculoskeletal: Negative for myalgias, back pain, joint swelling, arthralgias and gait problem.  Skin: Positive for color change and wound. Negative for pallor and rash.  Neurological: Negative for dizziness, tremors, weakness and light-headedness.  Hematological: Negative for adenopathy. Does not bruise/bleed easily.  Psychiatric/Behavioral: Negative for behavioral problems, confusion, sleep  disturbance, dysphoric mood, decreased concentration and agitation.       Objective:   Physical Exam  Constitutional: He is oriented to person, place, and time. He appears well-developed and well-nourished. No distress.  HENT:  Head: Normocephalic and atraumatic.  Mouth/Throat: Oropharynx is clear and moist.  Eyes: Conjunctivae and EOM are normal. Pupils are equal, round, and reactive to light. No scleral icterus.  Neck: Normal range of motion. Neck supple.  Cardiovascular: Normal rate, regular rhythm and normal heart sounds.  Exam reveals no gallop and no friction rub.   No murmur heard. Pulmonary/Chest: Effort normal. No respiratory distress. He has wheezes.  Abdominal: He exhibits no distension. There is no tenderness.  Musculoskeletal: He exhibits edema. He exhibits no tenderness.       Feet:  Neurological: He is alert and oriented to person, place, and time.  Skin: Skin is warm and dry. He is not diaphoretic.     Psychiatric: He has a normal mood and affect. Judgment normal.          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 I will make sure we push to 6 weeks of IV vancomycin, cefepime and flagyl and then give him protracted oral therapy while he has wound vacuum etc in place  PVD: sp bypass surgery

## 2012-09-19 ENCOUNTER — Inpatient Hospital Stay (HOSPITAL_COMMUNITY): Admission: RE | Admit: 2012-09-19 | Payer: Medicare Other | Source: Ambulatory Visit | Admitting: Orthopedic Surgery

## 2012-09-19 ENCOUNTER — Encounter (HOSPITAL_COMMUNITY): Admission: RE | Payer: Self-pay | Source: Ambulatory Visit

## 2012-09-19 SURGERY — AMPUTATION BELOW KNEE
Anesthesia: General | Site: Foot | Laterality: Left

## 2012-09-20 ENCOUNTER — Encounter: Payer: Self-pay | Admitting: Surgery

## 2012-09-21 ENCOUNTER — Other Ambulatory Visit: Payer: Self-pay | Admitting: Internal Medicine

## 2012-09-23 ENCOUNTER — Encounter: Payer: Self-pay | Admitting: Surgery

## 2012-09-23 ENCOUNTER — Ambulatory Visit (INDEPENDENT_AMBULATORY_CARE_PROVIDER_SITE_OTHER): Payer: Self-pay | Admitting: Surgery

## 2012-09-23 VITALS — BP 102/66 | HR 113 | Ht 66.0 in | Wt 131.1 lb

## 2012-09-23 DIAGNOSIS — I739 Peripheral vascular disease, unspecified: Secondary | ICD-10-CM

## 2012-09-23 DIAGNOSIS — I7025 Atherosclerosis of native arteries of other extremities with ulceration: Secondary | ICD-10-CM | POA: Insufficient documentation

## 2012-09-23 NOTE — Progress Notes (Signed)
[facility] POST OPERATIVE OFFICE NOTE  CC:  F/u for surgery  HPI:  This is a 62 y.o. male who is s/p left femoral to AK popliteal artery bypass with 6mm proaten PTFE on 09/03/12.  He has an extensive wound on the lateral aspect of his left foot.  He recently saw Dr. Victorino Dike who recommended a left BKA.  He did see Dr. Daiva Eves last week and he will continue his IV ABx for 6 weeks.  He states that the wound looks a little better than it did.  No Known Allergies  Current Outpatient Prescriptions  Medication Sig Dispense Refill  . acetaminophen (TYLENOL) 500 MG tablet Take 1,000 mg by mouth every 6 (six) hours as needed for pain.       Marland Kitchen aspirin 81 MG tablet Take 81 mg by mouth daily.      Marland Kitchen atorvastatin (LIPITOR) 20 MG tablet Take 20 mg by mouth daily.      . budesonide-formoterol (SYMBICORT) 160-4.5 MCG/ACT inhaler Inhale 2 puffs into the lungs 2 (two) times daily as needed (for shortness of breath).      Marland Kitchen dextrose 5 % SOLN 50 mL with ceFEPIme 2 G SOLR 2 g Inject 2 g into the vein every 8 (eight) hours.      . feeding supplement (ENSURE COMPLETE) LIQD Take 237 mLs by mouth 2 (two) times daily between meals.      . gabapentin (NEURONTIN) 600 MG tablet Take 600 mg by mouth every morning.       Marland Kitchen lisinopril (PRINIVIL,ZESTRIL) 20 MG tablet Take 20 mg by mouth daily as needed (as needed for increased blood pressure.).       Marland Kitchen metroNIDAZOLE (FLAGYL) 500 MG tablet Take 500 mg by mouth every 8 (eight) hours.      . nortriptyline (PAMELOR) 75 MG capsule Take 75 mg by mouth at bedtime.      Marland Kitchen oxyCODONE-acetaminophen (PERCOCET/ROXICET) 5-325 MG per tablet Take 1-2 tablets by mouth every 4 (four) hours as needed.      . sodium chloride 0.9 % SOLN 250 mL with vancomycin 10 G SOLR 1,250 mg Inject 1,250 mg into the vein every 12 (twelve) hours. Dose to be monitored by outpatient pharmacy and ID physician, total 21 day course  42 ampule  0  . tamsulosin (FLOMAX) 0.4 MG CAPS Take 0.4 mg by mouth  daily.        No current facility-administered medications for this visit.     ROS:  See HPI  Physical Exam:  Filed Vitals:   09/23/12 1351  BP: 102/66  Pulse: 113    Incision:  Left groin wound is healed as well as all other incisions. Extremities:  Left foot with necrotic tissue to the lateral aspect of the foot.  There are two circular areas on the dorsum of the right foot with necrotic tissue.   A/P:  This is a 62 y.o. male here for f/u to his left femoral to AK popliteal bypass with non-healing wound to left foot.  -Dr. Myra Gianotti debrided part of the wound today in the office.  He did have adequate blood flow to the area after debridement.  There was a portion of it the pt could not tolerate due to pain. -will have pt do wet to dry dressing to the foot for two days.  Will take pt to the operating room on Thursday, 09/26/12 to undergo further debridement and placement of wound vac. -pt is not on plavix, only  aspirin and he is to continue this.   Doreatha Massed, PA-C [facility] [phone]  Clinic MD:  Pt seen and examined with Dr. Myra Gianotti   I agree with the above. The patient has been seen and examined. I debrided a large eschar off of the lateral side of the left foot. There does appear to be viable tissue distally, however proximally it was questionable. The patient did not tolerate the debridement secondary to pain, and therefore I have recommended taken into the operating room for debridement under general anesthesia and application of a wound VAC. This is been scheduled for this Thursday.  Durene Cal

## 2012-09-24 ENCOUNTER — Other Ambulatory Visit: Payer: Self-pay

## 2012-09-25 ENCOUNTER — Encounter (HOSPITAL_COMMUNITY): Payer: Self-pay | Admitting: *Deleted

## 2012-09-25 ENCOUNTER — Encounter (HOSPITAL_COMMUNITY): Payer: Self-pay | Admitting: Pharmacy Technician

## 2012-09-25 MED ORDER — DEXTROSE 5 % IV SOLN
1.5000 g | INTRAVENOUS | Status: AC
  Administered 2012-09-26: 1.5 g via INTRAVENOUS
  Filled 2012-09-25: qty 1.5

## 2012-09-25 NOTE — Progress Notes (Addendum)
Pt denies SOB and chest pain.  According to pt, he is under the care of Dr. Allyson Sabal ( cardiology ) at [facility] and Dr. Myra Gianotti [facility]. Pt stated that he had a cardiac cath 10 years ago at [facility]. When attempting to give pt instructions on what medications to take on the DOS, pt stated that he was advised by Dr. Estanislado Spire nurse to "only take Lisinopril and Aspirin on the morning of surgery." Pt advised to follow those instructions in addition to bringing in his budesonide-formoterol (SYMBICORT) 160-4.5 MCG/ACT inhaler on the DOS. Revonda Standard, PA  (anesthesia) reviewed pt chest x rays and cardiac studies; see printed progress note in pt chart.

## 2012-09-26 ENCOUNTER — Encounter (HOSPITAL_COMMUNITY): Payer: Self-pay | Admitting: Anesthesiology

## 2012-09-26 ENCOUNTER — Encounter (HOSPITAL_COMMUNITY): Payer: Self-pay | Admitting: *Deleted

## 2012-09-26 ENCOUNTER — Ambulatory Visit (HOSPITAL_COMMUNITY): Payer: Medicare Other | Admitting: Anesthesiology

## 2012-09-26 ENCOUNTER — Observation Stay (HOSPITAL_COMMUNITY)
Admission: RE | Admit: 2012-09-26 | Discharge: 2012-09-27 | Disposition: A | Discharge status: Home Health | Payer: Medicare Other | Source: Ambulatory Visit | Attending: Surgery | Admitting: Surgery

## 2012-09-26 ENCOUNTER — Encounter (HOSPITAL_COMMUNITY)
Admission: RE | Disposition: A | Discharge status: Home Health | Payer: Self-pay | Source: Ambulatory Visit | Attending: Surgery

## 2012-09-26 ENCOUNTER — Telehealth: Payer: Self-pay | Admitting: Surgery

## 2012-09-26 DIAGNOSIS — Z7982 Long term (current) use of aspirin: Secondary | ICD-10-CM | POA: Insufficient documentation

## 2012-09-26 DIAGNOSIS — Y849 Medical procedure, unspecified as the cause of abnormal reaction of the patient, or of later complication, without mention of misadventure at the time of the procedure: Secondary | ICD-10-CM | POA: Insufficient documentation

## 2012-09-26 DIAGNOSIS — T8189XA Other complications of procedures, not elsewhere classified, initial encounter: Principal | ICD-10-CM | POA: Insufficient documentation

## 2012-09-26 DIAGNOSIS — J449 Chronic obstructive pulmonary disease, unspecified: Secondary | ICD-10-CM | POA: Insufficient documentation

## 2012-09-26 DIAGNOSIS — S98139A Complete traumatic amputation of one unspecified lesser toe, initial encounter: Secondary | ICD-10-CM | POA: Insufficient documentation

## 2012-09-26 DIAGNOSIS — L98492 Non-pressure chronic ulcer of skin of other sites with fat layer exposed: Secondary | ICD-10-CM

## 2012-09-26 DIAGNOSIS — Z85118 Personal history of other malignant neoplasm of bronchus and lung: Secondary | ICD-10-CM | POA: Insufficient documentation

## 2012-09-26 DIAGNOSIS — Z7901 Long term (current) use of anticoagulants: Secondary | ICD-10-CM | POA: Insufficient documentation

## 2012-09-26 DIAGNOSIS — Z8673 Personal history of transient ischemic attack (TIA), and cerebral infarction without residual deficits: Secondary | ICD-10-CM | POA: Insufficient documentation

## 2012-09-26 DIAGNOSIS — E785 Hyperlipidemia, unspecified: Secondary | ICD-10-CM | POA: Insufficient documentation

## 2012-09-26 DIAGNOSIS — Z79899 Other long term (current) drug therapy: Secondary | ICD-10-CM | POA: Insufficient documentation

## 2012-09-26 DIAGNOSIS — J4489 Other specified chronic obstructive pulmonary disease: Secondary | ICD-10-CM | POA: Insufficient documentation

## 2012-09-26 DIAGNOSIS — E876 Hypokalemia: Secondary | ICD-10-CM | POA: Insufficient documentation

## 2012-09-26 DIAGNOSIS — I10 Essential (primary) hypertension: Secondary | ICD-10-CM | POA: Insufficient documentation

## 2012-09-26 DIAGNOSIS — T879 Unspecified complications of amputation stump: Secondary | ICD-10-CM

## 2012-09-26 HISTORY — DX: Pneumonia, unspecified organism: J18.9

## 2012-09-26 HISTORY — PX: I&D EXTREMITY: SHX5045

## 2012-09-26 LAB — POCT I-STAT 4, (NA,K, GLUC, HGB,HCT)
Glucose, Bld: 101 mg/dL — ABNORMAL HIGH (ref 70–99)
HCT: 34 % — ABNORMAL LOW (ref 39.0–52.0)
Sodium: 140 mEq/L (ref 135–145)

## 2012-09-26 LAB — CBC
MCH: 32.7 pg (ref 26.0–34.0)
MCV: 96.1 fL (ref 78.0–100.0)
Platelets: 213 10*3/uL (ref 150–400)
RDW: 19 % — ABNORMAL HIGH (ref 11.5–15.5)

## 2012-09-26 SURGERY — IRRIGATION AND DEBRIDEMENT EXTREMITY
Anesthesia: Monitor Anesthesia Care | Site: Foot | Laterality: Left | Wound class: Clean

## 2012-09-26 MED ORDER — DEXAMETHASONE SODIUM PHOSPHATE 4 MG/ML IJ SOLN
INTRAMUSCULAR | Status: DC | PRN
  Administered 2012-09-26: 4 mg

## 2012-09-26 MED ORDER — NORTRIPTYLINE HCL 25 MG PO CAPS
75.0000 mg | ORAL_CAPSULE | Freq: Every day | ORAL | Status: DC
  Administered 2012-09-26: 75 mg via ORAL
  Filled 2012-09-26 (×2): qty 3

## 2012-09-26 MED ORDER — PANTOPRAZOLE SODIUM 40 MG PO TBEC
40.0000 mg | DELAYED_RELEASE_TABLET | Freq: Every day | ORAL | Status: DC
  Administered 2012-09-26 – 2012-09-27 (×2): 40 mg via ORAL
  Filled 2012-09-26 (×2): qty 1

## 2012-09-26 MED ORDER — ENSURE COMPLETE PO LIQD
237.0000 mL | Freq: Two times a day (BID) | ORAL | Status: DC
  Administered 2012-09-26: 237 mL via ORAL

## 2012-09-26 MED ORDER — ACETAMINOPHEN 325 MG PO TABS: 325.0000 mg | ORAL_TABLET | ORAL | Status: DC | PRN

## 2012-09-26 MED ORDER — BUDESONIDE-FORMOTEROL FUMARATE 160-4.5 MCG/ACT IN AERO
2.0000 | INHALATION_SPRAY | Freq: Two times a day (BID) | RESPIRATORY_TRACT | Status: DC
  Filled 2012-09-26: qty 6

## 2012-09-26 MED ORDER — SODIUM CHLORIDE 0.9 % IV SOLN: INTRAVENOUS | Status: DC

## 2012-09-26 MED ORDER — TAMSULOSIN HCL 0.4 MG PO CAPS
0.4000 mg | ORAL_CAPSULE | Freq: Every day | ORAL | Status: DC
  Administered 2012-09-27: 0.4 mg via ORAL
  Filled 2012-09-26 (×2): qty 1

## 2012-09-26 MED ORDER — POTASSIUM CHLORIDE CRYS ER 20 MEQ PO TBCR: 20.0000 meq | EXTENDED_RELEASE_TABLET | Freq: Once | ORAL | Status: AC | PRN

## 2012-09-26 MED ORDER — GUAIFENESIN-DM 100-10 MG/5ML PO SYRP: 15.0000 mL | ORAL_SOLUTION | ORAL | Status: DC | PRN

## 2012-09-26 MED ORDER — GABAPENTIN 600 MG PO TABS
600.0000 mg | ORAL_TABLET | Freq: Every day | ORAL | Status: DC
  Administered 2012-09-26 – 2012-09-27 (×2): 600 mg via ORAL
  Filled 2012-09-26 (×2): qty 1

## 2012-09-26 MED ORDER — OXYCODONE HCL 5 MG PO TABS: 5.0000 mg | ORAL_TABLET | Freq: Four times a day (QID) | ORAL | Status: DC | PRN

## 2012-09-26 MED ORDER — POTASSIUM CHLORIDE 10 MEQ/50ML IV SOLN
10.0000 meq | INTRAVENOUS | Status: AC
  Administered 2012-09-26 (×2): 10 meq via INTRAVENOUS
  Filled 2012-09-26 (×2): qty 50

## 2012-09-26 MED ORDER — PHENOL 1.4 % MT LIQD
1.0000 | OROMUCOSAL | Status: DC | PRN
  Filled 2012-09-26: qty 177

## 2012-09-26 MED ORDER — DEXTROSE 5 % IV SOLN
1.5000 g | Freq: Two times a day (BID) | INTRAVENOUS | Status: AC
  Administered 2012-09-26 – 2012-09-27 (×2): 1.5 g via INTRAVENOUS
  Filled 2012-09-26 (×2): qty 1.5

## 2012-09-26 MED ORDER — ENOXAPARIN SODIUM 30 MG/0.3ML ~~LOC~~ SOLN
30.0000 mg | SUBCUTANEOUS | Status: DC
  Administered 2012-09-27: 30 mg via SUBCUTANEOUS
  Filled 2012-09-26: qty 0.3

## 2012-09-26 MED ORDER — HYDRALAZINE HCL 20 MG/ML IJ SOLN: 10.0000 mg | INTRAMUSCULAR | Status: DC | PRN

## 2012-09-26 MED ORDER — ONDANSETRON HCL 4 MG/2ML IJ SOLN
INTRAMUSCULAR | Status: DC | PRN
  Administered 2012-09-26: 4 mg via INTRAVENOUS

## 2012-09-26 MED ORDER — PROMETHAZINE HCL 25 MG/ML IJ SOLN
6.2500 mg | INTRAMUSCULAR | Status: DC | PRN
Start: 2012-09-26 — End: 2012-09-26

## 2012-09-26 MED ORDER — VANCOMYCIN HCL 10 G IV SOLR
1500.0000 mg | INTRAVENOUS | Status: DC
  Administered 2012-09-26: 1500 mg via INTRAVENOUS
  Filled 2012-09-26 (×2): qty 1500

## 2012-09-26 MED ORDER — ACETAMINOPHEN 650 MG RE SUPP: 325.0000 mg | RECTAL | Status: DC | PRN

## 2012-09-26 MED ORDER — DOPAMINE-DEXTROSE 3.2-5 MG/ML-% IV SOLN
3.0000 ug/kg/min | INTRAVENOUS | Status: DC
  Filled 2012-09-26: qty 250

## 2012-09-26 MED ORDER — OXYCODONE HCL 5 MG PO TABS
5.0000 mg | ORAL_TABLET | Freq: Four times a day (QID) | ORAL | Status: DC | PRN
  Administered 2012-09-26: 5 mg via ORAL
  Administered 2012-09-26 – 2012-09-27 (×3): 10 mg via ORAL
  Filled 2012-09-26 (×3): qty 2
  Filled 2012-09-26: qty 1

## 2012-09-26 MED ORDER — ALUM & MAG HYDROXIDE-SIMETH 200-200-20 MG/5ML PO SUSP: 15.0000 mL | ORAL | Status: DC | PRN

## 2012-09-26 MED ORDER — SODIUM CHLORIDE 0.9 % IV SOLN
INTRAVENOUS | Status: DC
  Administered 2012-09-26: 15:00:00 via INTRAVENOUS

## 2012-09-26 MED ORDER — LISINOPRIL 20 MG PO TABS
20.0000 mg | ORAL_TABLET | Freq: Every day | ORAL | Status: DC
  Administered 2012-09-27: 20 mg via ORAL
  Filled 2012-09-26: qty 1

## 2012-09-26 MED ORDER — ONDANSETRON HCL 4 MG/2ML IJ SOLN: 4.0000 mg | Freq: Four times a day (QID) | INTRAMUSCULAR | Status: DC | PRN

## 2012-09-26 MED ORDER — FENTANYL CITRATE 0.05 MG/ML IJ SOLN
INTRAMUSCULAR | Status: AC
Start: 2012-09-26 — End: 2012-09-26
  Administered 2012-09-26: 50 ug
  Filled 2012-09-26: qty 2

## 2012-09-26 MED ORDER — ASPIRIN 81 MG PO TABS: 81.0000 mg | ORAL_TABLET | Freq: Every day | ORAL | Status: DC

## 2012-09-26 MED ORDER — LACTATED RINGERS IV SOLN
INTRAVENOUS | Status: DC | PRN
  Administered 2012-09-26: 09:00:00 via INTRAVENOUS

## 2012-09-26 MED ORDER — HYDROMORPHONE HCL PF 1 MG/ML IJ SOLN: 0.2500 mg | INTRAMUSCULAR | Status: DC | PRN

## 2012-09-26 MED ORDER — SODIUM CHLORIDE 0.9 % IV SOLN: 500.0000 mL | Freq: Once | INTRAVENOUS | Status: AC | PRN

## 2012-09-26 MED ORDER — LACTATED RINGERS IV SOLN
INTRAVENOUS | Status: DC
  Administered 2012-09-26: 08:00:00 via INTRAVENOUS

## 2012-09-26 MED ORDER — DOCUSATE SODIUM 100 MG PO CAPS
100.0000 mg | ORAL_CAPSULE | Freq: Every day | ORAL | Status: DC
  Administered 2012-09-27: 100 mg via ORAL
  Filled 2012-09-26: qty 1

## 2012-09-26 MED ORDER — METOPROLOL TARTRATE 1 MG/ML IV SOLN: 2.0000 mg | INTRAVENOUS | Status: DC | PRN

## 2012-09-26 MED ORDER — ASPIRIN EC 81 MG PO TBEC
81.0000 mg | DELAYED_RELEASE_TABLET | Freq: Every day | ORAL | Status: DC
  Administered 2012-09-27: 81 mg via ORAL
  Filled 2012-09-26: qty 1

## 2012-09-26 MED ORDER — DEXTROSE 5 % IV SOLN
2.0000 g | Freq: Three times a day (TID) | INTRAVENOUS | Status: DC
  Administered 2012-09-26 – 2012-09-27 (×2): 2 g via INTRAVENOUS
  Filled 2012-09-26 (×6): qty 2

## 2012-09-26 MED ORDER — ROPIVACAINE HCL 5 MG/ML IJ SOLN
INTRAMUSCULAR | Status: DC | PRN
  Administered 2012-09-26: 150 mg via EPIDURAL

## 2012-09-26 MED ORDER — MIDAZOLAM HCL 2 MG/2ML IJ SOLN
INTRAMUSCULAR | Status: AC
  Administered 2012-09-26: 1 mg
  Filled 2012-09-26: qty 2

## 2012-09-26 MED ORDER — OXYCODONE HCL 5 MG PO TABS
5.0000 mg | ORAL_TABLET | Freq: Once | ORAL | Status: DC | PRN
Start: 2012-09-26 — End: 2012-09-26

## 2012-09-26 MED ORDER — MORPHINE SULFATE 2 MG/ML IJ SOLN: 2.0000 mg | INTRAMUSCULAR | Status: DC | PRN

## 2012-09-26 MED ORDER — OXYCODONE HCL 5 MG/5ML PO SOLN: 5.0000 mg | Freq: Once | ORAL | Status: DC | PRN

## 2012-09-26 MED ORDER — LABETALOL HCL 5 MG/ML IV SOLN
10.0000 mg | INTRAVENOUS | Status: DC | PRN
  Filled 2012-09-26: qty 4

## 2012-09-26 MED ORDER — ATORVASTATIN CALCIUM 20 MG PO TABS
20.0000 mg | ORAL_TABLET | Freq: Every day | ORAL | Status: DC
  Administered 2012-09-26: 20 mg via ORAL
  Filled 2012-09-26 (×2): qty 1

## 2012-09-26 MED ORDER — 0.9 % SODIUM CHLORIDE (POUR BTL) OPTIME
TOPICAL | Status: DC | PRN
  Administered 2012-09-26: 1000 mL

## 2012-09-26 SURGICAL SUPPLY — 39 items
BANDAGE ELASTIC 4 VELCRO ST LF (GAUZE/BANDAGES/DRESSINGS) ×2 IMPLANT
BANDAGE ELASTIC 6 VELCRO ST LF (GAUZE/BANDAGES/DRESSINGS) IMPLANT
BANDAGE GAUZE ELAST BULKY 4 IN (GAUZE/BANDAGES/DRESSINGS) ×2 IMPLANT
CANISTER SUCTION 2500CC (MISCELLANEOUS) ×2 IMPLANT
CANISTER WOUND CARE 500ML ATS (WOUND CARE) ×2 IMPLANT
CLIP TI MEDIUM 6 (CLIP) ×2 IMPLANT
CLIP TI WIDE RED SMALL 6 (CLIP) ×2 IMPLANT
CLOTH BEACON ORANGE TIMEOUT ST (SAFETY) ×2 IMPLANT
COVER SURGICAL LIGHT HANDLE (MISCELLANEOUS) ×2 IMPLANT
DRAPE EXTREMITY BILATERAL (DRAPE) IMPLANT
DRAPE EXTREMITY T 121X128X90 (DRAPE) ×2 IMPLANT
DRAPE U-SHAPE 76X120 STRL (DRAPES) IMPLANT
DRSG VAC ATS SM SENSATRAC (GAUZE/BANDAGES/DRESSINGS) ×2 IMPLANT
ELECT REM PT RETURN 9FT ADLT (ELECTROSURGICAL) ×2
ELECTRODE REM PT RTRN 9FT ADLT (ELECTROSURGICAL) ×1 IMPLANT
GAUZE XEROFORM 5X9 LF (GAUZE/BANDAGES/DRESSINGS) IMPLANT
GLOVE BIO SURGEON STRL SZ7.5 (GLOVE) ×2 IMPLANT
GLOVE BIOGEL PI IND STRL 7.5 (GLOVE) ×2 IMPLANT
GLOVE BIOGEL PI INDICATOR 7.5 (GLOVE) ×2
GLOVE SURG SS PI 7.5 STRL IVOR (GLOVE) ×2 IMPLANT
GOWN PREVENTION PLUS XXLARGE (GOWN DISPOSABLE) ×2 IMPLANT
GOWN STRL NON-REIN LRG LVL3 (GOWN DISPOSABLE) ×6 IMPLANT
KIT BASIN OR (CUSTOM PROCEDURE TRAY) ×2 IMPLANT
KIT ROOM TURNOVER OR (KITS) ×2 IMPLANT
NS IRRIG 1000ML POUR BTL (IV SOLUTION) ×2 IMPLANT
PACK CV ACCESS (CUSTOM PROCEDURE TRAY) IMPLANT
PACK GENERAL/GYN (CUSTOM PROCEDURE TRAY) ×2 IMPLANT
PACK UNIVERSAL I (CUSTOM PROCEDURE TRAY) IMPLANT
PAD ARMBOARD 7.5X6 YLW CONV (MISCELLANEOUS) ×4 IMPLANT
SPONGE GAUZE 4X4 12PLY (GAUZE/BANDAGES/DRESSINGS) ×2 IMPLANT
STAPLER VISISTAT 35W (STAPLE) IMPLANT
SUT ETHILON 3 0 PS 1 (SUTURE) IMPLANT
SUT VIC AB 2-0 CTX 36 (SUTURE) IMPLANT
SUT VIC AB 3-0 SH 27 (SUTURE)
SUT VIC AB 3-0 SH 27X BRD (SUTURE) IMPLANT
SUT VICRYL 4-0 PS2 18IN ABS (SUTURE) IMPLANT
TOWEL OR 17X24 6PK STRL BLUE (TOWEL DISPOSABLE) ×2 IMPLANT
TOWEL OR 17X26 10 PK STRL BLUE (TOWEL DISPOSABLE) ×2 IMPLANT
WATER STERILE IRR 1000ML POUR (IV SOLUTION) ×2 IMPLANT

## 2012-09-26 NOTE — Anesthesia Postprocedure Evaluation (Signed)
Anesthesia Post Note  Patient: [name]  Procedure(s) Performed: Procedure(s) (LRB): IRRIGATION AND DEBRIDEMENT EXTREMITY- LEFT FOOT WITH WOUND VAC PLACEMENT (Left)  Anesthesia type: MAC  Patient location: PACU  Post pain: Pain level controlled  Post assessment: Patient's Cardiovascular Status Stable  Last Vitals:  Filed Vitals:   09/26/12 1145  BP: 141/63  Pulse: 108  Temp:   Resp: 23    Post vital signs: Reviewed and stable  Level of consciousness: sedated  Complications: No apparent anesthesia complications

## 2012-09-26 NOTE — Interval H&P Note (Signed)
History and Physical Interval Note:  09/26/2012 7:48 AM  [name]  has presented today for surgery, with the diagnosis of Nonhealing surgical wound left foot  The various methods of treatment have been discussed with the patient and family. After consideration of risks, benefits and other options for treatment, the patient has consented to  Procedure(s): IRRIGATION AND DEBRIDEMENT EXTREMITY- LEFT FOOT WITH WOUND VAC PLACEMENT (Left) as a surgical intervention .  The patient's history has been reviewed, patient examined, no change in status, stable for surgery.  I have reviewed the patient's chart and labs.  Questions were answered to the patient's satisfaction.     BRABHAM IV, V. WELLS

## 2012-09-26 NOTE — Telephone Encounter (Addendum)
Message copied by Rosalyn Charters on Thu Sep 26, 2012 10:33 AM ------      Message from: Lorin Mercy K      Created: Thu Sep 26, 2012 10:07 AM      Regarding: schedule                   ----- Message -----         From: Dara Lords, PA-C         Sent: 09/26/2012  10:02 AM           To: Sharee Pimple, CMA            S/p wound debridement of left foot with placement of wound vac 09/26/12.  F/u with Dr. Myra Gianotti in 2 weeks.            Thanks,      Lelon Mast ------ notified patient of post op appt on 10-21-12 8:45 am with dr. Myra Gianotti

## 2012-09-26 NOTE — Preoperative (Signed)
Beta Blockers   Reason not to administer Beta Blockers:Not Applicable 

## 2012-09-26 NOTE — Anesthesia Preprocedure Evaluation (Addendum)
Anesthesia Evaluation  Patient identified by MRN, date of birth, ID band Patient awake    Reviewed: Allergy & Precautions, H&P , NPO status , Patient's Chart, lab work & pertinent test results  History of Anesthesia Complications Negative for: history of anesthetic complications  Airway Mallampati: II TM Distance: >3 FB Neck ROM: Full    Dental  (+) Poor Dentition, Dental Advisory Given and Edentulous Upper   Pulmonary shortness of breath and with exertion, pneumonia -, COPD   Pulmonary exam normal       Cardiovascular hypertension, Pt. on medications + Peripheral Vascular Disease Rhythm:Regular Rate:Normal     Neuro/Psych CVA, Residual Symptoms negative psych ROS   GI/Hepatic negative GI ROS, Neg liver ROS,   Endo/Other  negative endocrine ROS  Renal/GU negative Renal ROS     Musculoskeletal   Abdominal   Peds  Hematology   Anesthesia Other Findings   Reproductive/Obstetrics negative OB ROS                          Anesthesia Physical Anesthesia Plan  ASA: III  Anesthesia Plan: MAC and Regional   Post-op Pain Management:    Induction:   Airway Management Planned: Simple Face Mask  Additional Equipment:   Intra-op Plan:   Post-operative Plan:   Informed Consent:   Dental advisory given and Consent reviewed with POA  Plan Discussed with: CRNA, Anesthesiologist and Surgeon  Anesthesia Plan Comments:         Anesthesia Quick Evaluation

## 2012-09-26 NOTE — Progress Notes (Signed)
ANTIBIOTIC CONSULT NOTE - INITIAL  Pharmacy Consult for vancomycin and cefepime Indication: gangrene, osteo, non-healing foot wound s/p I&D  No Known Allergies  Patient Measurements: Height: 5\' 6"  (167.6 cm) Weight: 132 lb (59.875 kg) IBW/kg (Calculated) : 63.8   Vital Signs: Temp: 98.2 F (36.8 C) (09/11 1145) Temp src: Oral (09/11 0733) BP: 141/63 mmHg (09/11 1145) Pulse Rate: 108 (09/11 1145) Intake/Output from previous day:   Intake/Output from this shift: Total I/O In: 340 [P.O.:240; I.V.:100] Out: 300 [Urine:300]  Labs:  Recent Labs  09/26/12 0747 09/26/12 1230  WBC  --  5.4  HGB 11.6* 10.0*  PLT  --  213   Estimated Creatinine Clearance: 80.1 ml/min (by C-G formula based on Cr of 0.64). No results found for this basename: VANCOTROUGH, Leodis Binet, VANCORANDOM, GENTTROUGH, GENTPEAK, GENTRANDOM, TOBRATROUGH, TOBRAPEAK, TOBRARND, AMIKACINPEAK, AMIKACINTROU, AMIKACIN,  in the last 72 hours   Microbiology: Recent Results (from the past 720 hour(s))  SURGICAL PCR SCREEN     Status: None   Collection Time    09/03/12  6:57 AM      Result Value Range Status   MRSA, PCR NEGATIVE  NEGATIVE Final   Staphylococcus aureus NEGATIVE  NEGATIVE Final   Comment:            The Xpert SA Assay (FDA     approved for NASAL specimens     in patients over 63 years of age),     is one component of     a comprehensive surveillance     program.  Test performance has     been validated by [employer] for patients greater     than or equal to 63 year old.     It is not intended     to diagnose infection nor to     guide or monitor treatment.    Medical History: Past Medical History  Diagnosis Date  . BENIGN PROSTATIC HYPERTROPHY 12/24/2009  . CEREBROVASCULAR ACCIDENT, HX OF 12/24/2009  . HYPERLIPIDEMIA 12/24/2009  . HYPERTENSION 12/24/2009  . Testosterone deficiency   . ED (erectile dysfunction)   . Claudication in peripheral vascular disease, lifestyle limiting, with  abnormal arterial dopplers of lower ext. 06/30/2011  . S/P angioplasty with stent to Lt. SFA 06/30/11 06/30/2011  . COPD 12/24/2009  . Shortness of breath     "from the COPD" (10/13/2011)  . Stroke 1996    "right side not as strong as left since; numbness R leg/foot" (10/13/2011)  . Pernicious anemia   . Complication of anesthesia     hard to wake up  . Lung cancer   . Pneumonia     Assessment: 62 YOM with gangrene, abscess, osteomyelitis sp 4th, 5th ray amputaion and I&D of foot abscess now s/p repeat I&D and vac placement of his left foot 9/11. He was in the hospital twice in August, and pharmacy managed antibiotics for him then. Per ID clinic notes, he is to have 6 weeks of IV antibiotics (appears stop date is ~9/21 per chart review). Confirmed most recent doses of abx with pt and wife- Vanc 1500mg  IV Q24 (last dose PTA 9/10 @1800 ), cefepime 2g IV Q8 (last dose 9/10 @2100 ), and Flagyl PO 500mg  Q8 (last dose 9/10 @ 1400). Flagyl has not yet been ordered.  Vanc began 8/7 - dose incr from 1gm to 1250 mg IV q12hrs on 8/10  VT on 8/10 = 10.7 mcg/ml  VT on 8/14 = 19.4 mcg/ml VT was drawn  by [facility] 9/7, family unsure of level- but dose was decreased at that point  The last SCr is from 8/22 which was 0.64 and an est CrCl of ~37mL/min.  Goal of Therapy:  Vancomycin trough level 15-20 mcg/ml  Plan:  1. Vancomycin 1500mg  IV Q24h starting at 1800 2. Cefepime 2g IV Q8h starting at 1500 3. Follow up restart of Flagyl 4. Follow renal function, clinical progression, changes in ID recommendations, and vanc trough at [facility]  Mary-Anne Polizzi D. Neng Albee, PharmD Clinical Pharmacist Pager: [phone] 09/26/2012 2:00 PM

## 2012-09-26 NOTE — Op Note (Signed)
[facility]  Patient name: [name] MRN: [id number] DOB: Oct 15, 1950 Sex: male  09/26/2012 Pre-operative Diagnosis: Left foot wound Post-operative diagnosis:  Same Surgeon:  Jorge Ny Assistants:  None Procedure:   Incision and debridement of left foot including skin and soft tissue Anesthesia:  Regional block Blood Loss:  See anesthesia record Specimens:  None  Findings:  I was able to debride back to healthy tissue  Indications:  The patient initially underwent fourth and fifth toe amputation. This did not heal and therefore he underwent revascularization. He has a marginal foot. Prior to amputation, we will exhaust wound care options. Initially, he is going to have to undergo further debridement of the necrotic wound bed. I tried to do this in the office, however he could not tolerate it secondary to pain.  Procedure:  The patient was identified in the holding area and taken to [facility] OR ROOM 11  The patient was then placed supine on the table. regional anesthesia was administered.  The patient was prepped and draped in the usual sterile fashion.  A time out was called and antibiotics were administered.  A 10 blade was used to debris the nonviable tissue in the foot wound on the left foot. This included skin and soft tissue. I was able to debride back to healthy tissue which did bleed adequately. No bone was exposed. He also had 2 areas on the anterior surface of his foot which I removed the eschar over top. I was very pleased with the amount of bleeding in the debrided area. I placed a wet-to-dry dressing on the 2 small wounds on the anterior foot and then a wound VAC on the amputation site.   Disposition:  To PACU in stable condition.   Juleen China, M.D. [facility] [place] Office: [phone] Pager:  [phone]

## 2012-09-26 NOTE — Transfer of Care (Signed)
Immediate Anesthesia Transfer of Care Note  Patient: [name]  Procedure(s) Performed: Procedure(s): IRRIGATION AND DEBRIDEMENT EXTREMITY- LEFT FOOT WITH WOUND VAC PLACEMENT (Left)  Patient Location: PACU  Anesthesia Type:MAC  Level of Consciousness: awake, alert , oriented and patient cooperative  Airway & Oxygen Therapy: Patient Spontanous Breathing  Post-op Assessment: Report given to PACU RN and Post -op Vital signs reviewed and stable  Post vital signs: Reviewed and stable  Complications: No apparent anesthesia complications

## 2012-09-26 NOTE — Progress Notes (Signed)
Dr. Krista Blue stated not to repeat cxr.

## 2012-09-26 NOTE — Progress Notes (Addendum)
Dr. Krista Blue notified of potassium of 2.6. Stated to call Dr. Myra Gianotti.  Dr. Myra Gianotti notified of 2.6 potassium  Orders received.  Dr.Singer stated it was okay to use pt's picc line for iv access.

## 2012-09-26 NOTE — H&P (View-Only) (Signed)
[facility] POST OPERATIVE OFFICE NOTE  CC:  F/u for surgery  HPI:  This is a [name] who is s/p left femoral to AK popliteal artery bypass with 6mm proaten PTFE on 09/03/12.  He has an extensive wound on the lateral aspect of his left foot.  He recently saw Dr. Hewitt who recommended a left BKA.  He did see Dr. Van Dam last week and he will continue his IV ABx for 6 weeks.  He states that the wound looks a little better than it did.  No Known Allergies  Current Outpatient Prescriptions  Medication Sig Dispense Refill  . acetaminophen (TYLENOL) 500 MG tablet Take 1,000 mg by mouth every 6 (six) hours as needed for pain.       . aspirin 81 MG tablet Take 81 mg by mouth daily.      . atorvastatin (LIPITOR) 20 MG tablet Take 20 mg by mouth daily.      . budesonide-formoterol (SYMBICORT) 160-4.5 MCG/ACT inhaler Inhale 2 puffs into the lungs 2 (two) times daily as needed (for shortness of breath).      . dextrose 5 % SOLN 50 mL with ceFEPIme 2 G SOLR 2 g Inject 2 g into the vein every 8 (eight) hours.      . feeding supplement (ENSURE COMPLETE) LIQD Take 237 mLs by mouth 2 (two) times daily between meals.      . gabapentin (NEURONTIN) 600 MG tablet Take 600 mg by mouth every morning.       . lisinopril (PRINIVIL,ZESTRIL) 20 MG tablet Take 20 mg by mouth daily as needed (as needed for increased blood pressure.).       . metroNIDAZOLE (FLAGYL) 500 MG tablet Take 500 mg by mouth every 8 (eight) hours.      . nortriptyline (PAMELOR) 75 MG capsule Take 75 mg by mouth at bedtime.      . oxyCODONE-acetaminophen (PERCOCET/ROXICET) 5-325 MG per tablet Take 1-2 tablets by mouth every 4 (four) hours as needed.      . sodium chloride 0.9 % SOLN 250 mL with vancomycin 10 G SOLR 1,250 mg Inject 1,250 mg into the vein every 12 (twelve) hours. Dose to be monitored by outpatient pharmacy and ID physician, total 21 day course  42 ampule  0  . tamsulosin (FLOMAX) 0.4 MG CAPS Take 0.4 mg by mouth  daily.        No current facility-administered medications for this visit.     ROS:  See HPI  Physical Exam:  Filed Vitals:   09/23/12 1351  BP: 102/66  Pulse: 113    Incision:  Left groin wound is healed as well as all other incisions. Extremities:  Left foot with necrotic tissue to the lateral aspect of the foot.  There are two circular areas on the dorsum of the right foot with necrotic tissue.   A/P:  This is a [name] here for f/u to his left femoral to AK popliteal bypass with non-healing wound to left foot.  -Dr. Mishawn Hemann debrided part of the wound today in the office.  He did have adequate blood flow to the area after debridement.  There was a portion of it the pt could not tolerate due to pain. -will have pt do wet to dry dressing to the foot for two days.  Will take pt to the operating room on Thursday, 09/26/12 to undergo further debridement and placement of wound vac. -pt is not on plavix, only   aspirin and he is to continue this.   Samantha Rhyne, PA-C [facility] [phone]  Clinic MD:  Pt seen and examined with Dr. Cecilia Nishikawa   I agree with the above. The patient has been seen and examined. I debrided a large eschar off of the lateral side of the left foot. There does appear to be viable tissue distally, however proximally it was questionable. The patient did not tolerate the debridement secondary to pain, and therefore I have recommended taken into the operating room for debridement under general anesthesia and application of a wound VAC. This is been scheduled for this Thursday.  [name] 

## 2012-09-26 NOTE — Anesthesia Procedure Notes (Addendum)
Procedure Name: MAC Date/Time: 09/26/2012 9:20 AM Performed by: Lovie Chol Pre-anesthesia Checklist: Patient identified, Emergency Drugs available, Suction available, Patient being monitored and Timeout performed Patient Re-evaluated:Patient Re-evaluated prior to inductionOxygen Delivery Method: Simple face mask    Anesthesia Regional Block:  Popliteal block  Pre-Anesthetic Checklist: ,, timeout performed, Correct Patient, Correct Site, Correct Laterality, Correct Procedure, Correct Position, site marked, Risks and benefits discussed,  Surgical consent,  Pre-op evaluation,  At surgeon's request and post-op pain management  Laterality: Left  Prep: chloraprep       Needles:  Injection technique: Single-shot  Needle Type: Echogenic Stimulator Needle          Additional Needles:  Procedures: ultrasound guided (picture in chart) and nerve stimulator Popliteal block  Nerve Stimulator or Paresthesia:  Response: plantar flexion, 0.45 mA,   Additional Responses:   Narrative:  Start time: 09/26/2012 8:55 AM End time: 09/26/2012 9:08 AM Injection made incrementally with aspirations every 5 mL.  Performed by: Personally  Anesthesiologist: J. Adonis Huguenin, MD  Additional Notes: A functioning IV was confirmed and monitors were applied.  Sterile prep and drape, hand hygiene and sterile gloves were used.  Negative aspiration and test dose prior to incremental administration of local anesthetic. The patient tolerated the procedure well.Ultrasound  guidance: relevant anatomy identified, needle position confirmed, local anesthetic spread visualized around nerve(s), vascular puncture avoided.  Image printed for medical record.   Popliteal block

## 2012-09-27 ENCOUNTER — Encounter (HOSPITAL_COMMUNITY): Payer: Self-pay | Admitting: Surgery

## 2012-09-27 LAB — BASIC METABOLIC PANEL
BUN: 10 mg/dL (ref 6–23)
CO2: 27 mEq/L (ref 19–32)
Calcium: 8.6 mg/dL (ref 8.4–10.5)
Creatinine, Ser: 0.62 mg/dL (ref 0.50–1.35)
Glucose, Bld: 122 mg/dL — ABNORMAL HIGH (ref 70–99)

## 2012-09-27 LAB — CBC
Hemoglobin: 9.5 g/dL — ABNORMAL LOW (ref 13.0–17.0)
MCH: 32.8 pg (ref 26.0–34.0)
MCV: 94.8 fL (ref 78.0–100.0)
RBC: 2.9 MIL/uL — ABNORMAL LOW (ref 4.22–5.81)

## 2012-09-27 MED ORDER — AMOXICILLIN-POT CLAVULANATE 500-125 MG PO TABS: 1.0000 | ORAL_TABLET | Freq: Three times a day (TID) | ORAL | Status: DC

## 2012-09-27 MED ORDER — AMOXICILLIN-POT CLAVULANATE 500-125 MG PO TABS
1.0000 | ORAL_TABLET | Freq: Three times a day (TID) | ORAL | Status: DC
  Filled 2012-09-27 (×3): qty 1

## 2012-09-27 MED ORDER — POTASSIUM CHLORIDE CRYS ER 20 MEQ PO TBCR: 20.0000 meq | EXTENDED_RELEASE_TABLET | Freq: Two times a day (BID) | ORAL | Status: DC

## 2012-09-27 MED ORDER — POTASSIUM CHLORIDE CRYS ER 20 MEQ PO TBCR
40.0000 meq | EXTENDED_RELEASE_TABLET | Freq: Two times a day (BID) | ORAL | Status: DC
  Administered 2012-09-27: 40 meq via ORAL
  Filled 2012-09-27: qty 2

## 2012-09-27 NOTE — Care Management Note (Signed)
    Page 1 of 2   09/27/2012     5:30:44 PM   CARE MANAGEMENT NOTE 09/27/2012  Patient:  [name], [name]   Account Number:  [id number]  Date Initiated:  09/27/2012  Documentation initiated by:  Teller Wakefield  Subjective/Objective Assessment:   PT S/P LT FOOT I& D AND WOUND VAC PLACEMENT ON 09/26/12. PTA, PT LIVES WITH WIFE AND IS ACTIVE WITH [facility]-RN TO ASSIST WITH IV ANTIBIOTICS.     Action/Plan:   PT FOR DC HOME TODAY WITH VAC PENDING INSURANCE AUTH AND DELIVERY OF HOME VAC.   Anticipated DC Date:  09/27/2012   Anticipated DC Plan:  HOME W HOME HEALTH SERVICES      DC Planning Services  CM consult      [facility] Choice  HOME HEALTH  Resumption Of Svcs/PTA Provider   Choice offered to / List presented to:  C-1 Patient   DME arranged  VAC      DME agency  [facility]     HH arranged  HH-1 RN      Status of service:  Completed, signed off Medicare Important Message given?   (If response is "NO", the following Medicare IM given date fields will be blank) Date Medicare IM given:   Date Additional Medicare IM given:    Discharge Disposition:  HOME W HOME HEALTH SERVICES  Per UR Regulation:  Reviewed for med. necessity/level of care/duration of stay  If discussed at Long Length of Stay Meetings, dates discussed:    Comments:  09/27/12 Eileen Kangas,RN,BSN [phone]  1515 WOUND VAC APPROVED BY INSURANCE AND FOR DELIVERY WITHIN NEXT 2HRS.  [facility] AWARE OF DC TODAY AND PT'S NEED FOR WOUND VAC CHANGE ON 9/13.  PICC LINE DC'D TODAY, AS NO CONT NEED FOR IV ANTIBIOTICS, PER MD.

## 2012-09-27 NOTE — Progress Notes (Signed)
[facility] [place]  Subjective  -" My foot feels better and I can move and feel it more today."   Objective 131/65 93 97.6 F (36.4 C) (Oral) 18 95%  Intake/Output Summary (Last 24 hours) at 09/27/12 0742 Last data filed at 09/27/12 0515  Gross per 24 hour  Intake   2200 ml  Output    925 ml  Net   1275 ml    Left foot wound vac in place, dressing is clean and dry Sensation left great toe grossly intact  Assessment/Planning: Procedure(s): IRRIGATION AND DEBRIDEMENT EXTREMITY- LEFT FOOT WITH WOUND VAC PLACEMENT  1 Day Post-OpSurgeon(s): Nada Libman, MD Patient has K+ 2.7 ordered PO 40 meq BID Wound Vac for home? WB status? I will discuss the plan with Dr. Diamond Nickel, [facility] 09/27/2012 7:42 AM --  Laboratory Lab Results:  Recent Labs  09/26/12 1230 09/27/12 0600  WBC 5.4 7.2  HGB 10.0* 9.5*  HCT 29.4* 27.5*  PLT 213 202   BMET  Recent Labs  09/26/12 0747 09/26/12 1230 09/27/12 0600  NA 140  --  136  K 2.6*  --  2.7*  CL  --   --  101  CO2  --   --  27  GLUCOSE 101*  --  122*  BUN  --   --  10  CREATININE  --  0.63 0.62  CALCIUM  --   --  8.6    COAG Lab Results  Component Value Date   INR 1.13 08/26/2012   No results found for this basename: PTT

## 2012-09-27 NOTE — Progress Notes (Signed)
Nutrition Brief Note  Malnutrition Screening Tool result is inaccurate.  Please consult if nutrition needs are identified.  Lizann Edelman Kowalski RD, LDN Pager #[phone] After Hours pager #[phone]   

## 2012-09-27 NOTE — Evaluation (Addendum)
Physical Therapy Evaluation Patient Details Name: [name] MRN: [id number] DOB: 18-Dec-1950 Today's Date: 09/27/2012 Time: 4098-1191 PT Time Calculation (min): 16 min  PT Assessment / Plan / Recommendation History of Present Illness  Left foot wound s/p I &D  after recent amputation of the left 4th and 5th toes and fem-pop bypass graft. He has been receiving chemo for lung cancer, it is currently on hold. 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   Clinical Impression  Pt has been ambulating since prior surgeries and caring for himself. Pt currently able to don both shoes, perform transfers and gait with only initial cueing for sequence, weight on heel and safety with stairs, pt verbalizes understanding of all education and will have family at home. Pt agreeable to no further therapy needs. Will sign off.     PT Assessment  Patent does not need any further PT services    Follow Up Recommendations  Supervision - Intermittent;No PT follow up    Does the patient have the potential to tolerate intense rehabilitation      Barriers to Discharge        Equipment Recommendations  None recommended by PT    Recommendations for Other Services     Frequency      Precautions / Restrictions Precautions Precautions: Fall Required Braces or Orthoses: Other Brace/Splint Other Brace/Splint: darco shoe left foot Restrictions Weight Bearing Restrictions: Yes LLE Weight Bearing: Weight bearing as tolerated Other Position/Activity Restrictions: on heel only   Pertinent Vitals/Pain 5/10 left foot pain, RN notified      Mobility  Bed Mobility Supine to Sit: 6: Modified independent (Device/Increase time);HOB flat Sitting - Scoot to Edge of Bed: 6: Modified independent (Device/Increase time) Transfers Sit to Stand: 6: Modified independent (Device/Increase time);From bed Stand to Sit: 6: Modified independent (Device/Increase time);To  chair/3-in-1 Ambulation/Gait Ambulation/Gait Assistance: 5: Supervision Ambulation Distance (Feet): 300 Feet Assistive device: Rolling walker Ambulation/Gait Assistance Details: cueing for posture, position in RW and sequence Gait Pattern: Step-through pattern;Decreased stride length Gait velocity: decreased Stairs: Yes Stairs Assistance: 5: Supervision Stairs Assistance Details (indicate cue type and reason): cueing for sequence Stair Management Technique: Forwards;Two rails;Step to pattern Number of Stairs: 3    Exercises     PT Diagnosis: Difficulty walking  PT Problem List:   PT Treatment Interventions:       PT Goals(Current goals can be found in the care plan section) Acute Rehab PT Goals PT Goal Formulation: No goals set, d/c therapy  Visit Information  Last PT Received On: 09/27/12 Assistance Needed: +1 History of Present Illness: Left foot wound s/p I &D  after recent amputation of the left 4th and 5th toes and fem-pop bypass graft. He has been receiving chemo for lung cancer, it is currently on hold. 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        Prior Functioning  Home Living Family/patient expects to be discharged to:: Private residence Living Arrangements: Spouse/significant other Available Help at Discharge: Family;Available 24 hours/day Type of Home: House Home Access: Stairs to enter [employer] of Steps: 3 Entrance Stairs-Rails: Right Home Layout: One level Home Equipment: Wheelchair - manual;Cane - single point;Walker - 2 wheels Additional Comments: pt's wife works during the day but she reports that family can be with him while she is gone and had been with him PTA Prior Function Level of Independence: Independent Communication Communication: No difficulties Dominant Hand: Right  Cognition  Cognition Arousal/Alertness: Awake/alert Behavior During Therapy: WFL for tasks assessed/performed Overall Cognitive  Status: Within Functional Limits for tasks assessed    Extremity/Trunk Assessment Upper Extremity Assessment Upper Extremity Assessment: Overall WFL for tasks assessed Lower Extremity Assessment Lower Extremity Assessment: Overall WFL for tasks assessed Cervical / Trunk Assessment Cervical / Trunk Assessment: Normal   Balance    End of Session PT - End of Session Equipment Utilized During Treatment: Gait belt;Other (comment) (Darco) Activity Tolerance: Patient tolerated treatment well Patient left: in chair Nurse Communication: Mobility status  GP Functional Assessment Tool Used: clinical judgement Functional Limitation: Mobility: Walking and moving around Mobility: Walking and Moving Around Current Status (W0981): At least 1 percent but less than 20 percent impaired, limited or restricted Mobility: Walking and Moving Around Goal Status [phone]): At least 1 percent but less than 20 percent impaired, limited or restricted Mobility: Walking and Moving Around Discharge Status [phone]): At least 1 percent but less than 20 percent impaired, limited or restricted   [name] 09/27/2012, 2:16 PM [name], PT [phone]

## 2012-09-27 NOTE — Discharge Summary (Signed)
[facility] Discharge Summary   Patient ID:  [name] MRN: [id number] DOB/AGE: 10/13/1950 62 y.o.  Admit date: 09/26/2012 Discharge date: 09/27/2012 Date of Surgery: 09/26/2012 Surgeon: Surgeon(s): Nada Libman, MD  Admission Diagnosis: Nonhealing surgical wound left foot  Discharge Diagnoses:  Nonhealing surgical wound left foot  Secondary Diagnoses: Past Medical History  Diagnosis Date  . BENIGN PROSTATIC HYPERTROPHY 12/24/2009  . CEREBROVASCULAR ACCIDENT, HX OF 12/24/2009  . HYPERLIPIDEMIA 12/24/2009  . HYPERTENSION 12/24/2009  . Testosterone deficiency   . ED (erectile dysfunction)   . Claudication in peripheral vascular disease, lifestyle limiting, with abnormal arterial dopplers of lower ext. 06/30/2011  . S/P angioplasty with stent to Lt. SFA 06/30/11 06/30/2011  . COPD 12/24/2009  . Shortness of breath     "from the COPD" (10/13/2011)  . Stroke 1996    "right side not as strong as left since; numbness R leg/foot" (10/13/2011)  . Pernicious anemia   . Complication of anesthesia     hard to wake up  . Lung cancer   . Pneumonia     Procedure(s): IRRIGATION AND DEBRIDEMENT EXTREMITY- LEFT FOOT WITH WOUND VAC PLACEMENT  Discharged Condition: good  HPI: This is a 62 y.o. male who is s/p left femoral to AK popliteal artery bypass with 6mm proaten PTFE on 09/03/12. He has an extensive wound on the lateral aspect of his left foot. He recently saw Dr. Victorino Dike who recommended a left BKA. He did see Dr. Daiva Eves last week and he will continue his IV ABx for 6 weeks. He states that the wound looks a little better than it did. He was taken to the OR by Dr. Myra Gianotti 09/26/2012 for Incision and debridement of left foot including skin and soft tissue.  Dr. Myra Gianotti  placed a wet-to-dry dressing on the 2 small wounds on the anterior foot and then a wound VAC on the amputation site.  He was given K= post op secondary to hypokalemia.  His K+ this am is still low at 2.7.  He  will be given 40 meq prior to discharge and sent home with 20 meq PO BID until he follows up with his primary car doctor early next week.  He is heel weight bearing in a darco shoe.  His PICC line will be discharged prior to him going home.  He has been on IV antibiotics for 6 weeks and now will be placed on PO Augmentin 500 mg TID for 2 weeks and follow up with Dr. Daiva Eves. Home health RN ordered for wound care and wound vac dressing changes 3 times weekly.    Hospital Course:  [name] is a 62 y.o. male is S/P Left Procedure(s): IRRIGATION AND DEBRIDEMENT EXTREMITY- LEFT FOOT WITH WOUND VAC PLACEMENT Extubated: POD # 0 Physical exam: Clean dry dressing, wound vac in place Post-op wounds clean, dry, intact Pt. Ambulating, voiding and taking PO diet without difficulty. Pt pain controlled with PO pain meds. Labs as below Complications:see hospital course  Consults:     Significant Diagnostic Studies: CBC Lab Results  Component Value Date   WBC 7.2 09/27/2012   HGB 9.5* 09/27/2012   HCT 27.5* 09/27/2012   MCV 94.8 09/27/2012   PLT 202 09/27/2012    BMET    Component Value Date/Time   NA 136 09/27/2012 0600   NA 135* 08/15/2012 1117   K 2.7* 09/27/2012 0600   K 3.4* 08/15/2012 1117   CL 101 09/27/2012 0600   CL  97* 06/13/2012 1300   CO2 27 09/27/2012 0600   CO2 25 08/15/2012 1117   GLUCOSE 122* 09/27/2012 0600   GLUCOSE 115 08/15/2012 1117   GLUCOSE 123* 06/13/2012 1300   BUN 10 09/27/2012 0600   BUN 7.1 08/15/2012 1117   CREATININE 0.62 09/27/2012 0600   CREATININE 0.7 08/15/2012 1117   CALCIUM 8.6 09/27/2012 0600   CALCIUM 9.0 08/15/2012 1117   GFRNONAA >90 09/27/2012 0600   GFRAA >90 09/27/2012 0600   COAG Lab Results  Component Value Date   INR 1.13 08/26/2012     Disposition:  Discharge to :Home Discharge Orders   Future Appointments Provider Department Dept Phone   10/21/2012 8:45 AM Nada Libman, MD [facility] [phone]   12/06/2012  8:30 AM Gordy Savers, MD [facility] [phone]   Future Orders Complete By Expires   Call MD for:  redness, tenderness, or signs of infection (pain, swelling, bleeding, redness, odor or green/yellow discharge around incision site)  As directed    Call MD for:  severe or increased pain, loss or decreased feeling  in affected limb(s)  As directed    Call MD for:  temperature >100.5  As directed    Discharge patient  As directed    Comments:     Discharge pt to home   Discharge wound care:  As directed    Comments:     Wound vac changes M/W/F starting 09/30/12.   Resume previous diet  As directed        Medication List    STOP taking these medications       oxyCODONE-acetaminophen 5-325 MG per tablet  Commonly known as:  PERCOCET/ROXICET      TAKE these medications       acetaminophen 500 MG tablet  Commonly known as:  TYLENOL  Take 1,000 mg by mouth every 6 (six) hours as needed for pain.     amoxicillin-clavulanate 500-125 MG per tablet  Commonly known as:  AUGMENTIN  Take 1 tablet (500 mg total) by mouth every 8 (eight) hours.     aspirin 81 MG tablet  Take 81 mg by mouth daily.     atorvastatin 20 MG tablet  Commonly known as:  LIPITOR  Take 20 mg by mouth daily.     budesonide-formoterol 160-4.5 MCG/ACT inhaler  Commonly known as:  SYMBICORT  Inhale 2 puffs into the lungs 2 (two) times daily as needed (for shortness of breath).     dextrose 5 % SOLN 50 mL with ceFEPIme 2 G SOLR 2 g  Inject 2 g into the vein every 8 (eight) hours.     feeding supplement Liqd  Take 237 mLs by mouth 2 (two) times daily between meals.     gabapentin 600 MG tablet  Commonly known as:  NEURONTIN  Take 600 mg by mouth every morning.     lisinopril 20 MG tablet  Commonly known as:  PRINIVIL,ZESTRIL  Take 20 mg by mouth daily as needed (as needed for increased blood pressure.).     metroNIDAZOLE 500 MG tablet  Commonly known as:  FLAGYL  Take 500 mg  by mouth 3 (three) times daily.     nortriptyline 75 MG capsule  Commonly known as:  PAMELOR  Take 75 mg by mouth at bedtime.     oxyCODONE 5 MG immediate release tablet  Commonly known as:  ROXICODONE  Take 1-2 tablets (5-10 mg total) by mouth every 6 (six)  hours as needed for pain.     potassium chloride SA 20 MEQ tablet  Commonly known as:  K-DUR,KLOR-CON  Take 1 tablet (20 mEq total) by mouth 2 (two) times daily.     sodium chloride 0.9 % SOLN 500 mL with vancomycin 10 G SOLR 1,500 mg  Inject 1,500 mg into the vein daily.     tamsulosin 0.4 MG Caps capsule  Commonly known as:  FLOMAX  Take 0.4 mg by mouth daily.         Verbal and written Discharge instructions given to the patient. Wound care per Discharge AVS     Follow-up Information   Follow up with Myra Gianotti IV, Lala Lund, MD In 2 weeks. (Office will call you to make your appt (sent))    Specialty:  Vascular Surgery   Contact information:   [address] [place] [address] [phone]     Follow up with infectious disease Dr. Daiva Eves 2 weeks  Signed: Clinton Gallant [facility] 09/27/2012, 8:08 AM

## 2012-09-27 NOTE — Progress Notes (Signed)
CRITICAL VALUE ALERT  Critical value received:  K+ 2.7  Date of notification:  09/27/12  Time of notification:  0725 am  Critical value read back:yes  Nurse who received alert:  Barnett Applebaum, RN BC, BSN, MSN  MD notified (1st page):  Presley Raddle PA  Time of first page:  0730  MD notified (2nd page):  Time of second page:  Responding MD:  Presley Raddle PA  Time MD responded:  [phone] am

## 2012-09-30 ENCOUNTER — Ambulatory Visit: Payer: Medicare Other | Admitting: Surgery

## 2012-10-01 ENCOUNTER — Encounter: Payer: Self-pay | Admitting: Surgery

## 2012-10-01 NOTE — Discharge Summary (Signed)
I agree with the above  Wells Brabham 

## 2012-10-09 ENCOUNTER — Telehealth: Payer: Self-pay | Admitting: Internal Medicine

## 2012-10-09 ENCOUNTER — Other Ambulatory Visit: Payer: Self-pay | Admitting: *Deleted

## 2012-10-09 NOTE — Telephone Encounter (Signed)
pt called and needed to sched appt with Dr MM...done...see orders pt switched from Dr. Clelia Croft to Dr. Kerry Fort

## 2012-10-11 ENCOUNTER — Telehealth: Payer: Self-pay

## 2012-10-11 NOTE — Telephone Encounter (Signed)
Pt. called to inquire if he needs to have his antibiotic renewed, since his f/u appt. with Dr. Myra Gianotti is not for 10 days.( has appt. 10/21/12)  States he is finishing the last antibiotic tonight, and concerned about infection reoccurring.  Advised will discuss w/ Dr. Myra Gianotti, if he wants to refill the Augmentin.

## 2012-10-14 ENCOUNTER — Telehealth: Payer: Self-pay | Admitting: *Deleted

## 2012-10-14 NOTE — Telephone Encounter (Signed)
Ok but not too many Overbooks wed am as Lavenia Atlas got appt to be at post clinic thanks

## 2012-10-14 NOTE — Telephone Encounter (Signed)
Discussed with Dr. Myra Gianotti regarding question of renewing pt's antibiotic.  Stated that the infectious disease MD's are managing the antibiotic, but doesn't feel pt. will need a refill at this time.  Attempted to call pt.  Left voice message with Dr. Estanislado Spire response.

## 2012-10-14 NOTE — Telephone Encounter (Signed)
Pt never made a f/u appointment at his last visit, calling today b/c his [facility] told him the drainage around his wound looks greenish.  Pt would like appointment Wednesday 10/1 if possible, has 11:00 oncology appt already scheduled.  Pt overbooked for 9:00. Andree Coss, RN

## 2012-10-16 ENCOUNTER — Ambulatory Visit (INDEPENDENT_AMBULATORY_CARE_PROVIDER_SITE_OTHER): Payer: Medicare Other | Admitting: Infectious Disease

## 2012-10-16 ENCOUNTER — Telehealth: Payer: Self-pay

## 2012-10-16 ENCOUNTER — Other Ambulatory Visit (HOSPITAL_BASED_OUTPATIENT_CLINIC_OR_DEPARTMENT_OTHER): Payer: Medicare Other | Admitting: Lab

## 2012-10-16 ENCOUNTER — Encounter: Payer: Self-pay | Admitting: Internal Medicine

## 2012-10-16 ENCOUNTER — Encounter: Payer: Self-pay | Admitting: Infectious Disease

## 2012-10-16 ENCOUNTER — Telehealth: Payer: Self-pay | Admitting: Internal Medicine

## 2012-10-16 ENCOUNTER — Ambulatory Visit (HOSPITAL_BASED_OUTPATIENT_CLINIC_OR_DEPARTMENT_OTHER): Payer: Medicare Other | Admitting: Internal Medicine

## 2012-10-16 VITALS — BP 126/72 | HR 120 | Temp 97.9°F | Ht 66.0 in | Wt 130.0 lb

## 2012-10-16 DIAGNOSIS — S98919A Complete traumatic amputation of unspecified foot, level unspecified, initial encounter: Secondary | ICD-10-CM

## 2012-10-16 DIAGNOSIS — E1169 Type 2 diabetes mellitus with other specified complication: Secondary | ICD-10-CM

## 2012-10-16 DIAGNOSIS — M869 Osteomyelitis, unspecified: Secondary | ICD-10-CM

## 2012-10-16 DIAGNOSIS — C3410 Malignant neoplasm of upper lobe, unspecified bronchus or lung: Secondary | ICD-10-CM

## 2012-10-16 DIAGNOSIS — I70209 Unspecified atherosclerosis of native arteries of extremities, unspecified extremity: Secondary | ICD-10-CM

## 2012-10-16 DIAGNOSIS — I739 Peripheral vascular disease, unspecified: Secondary | ICD-10-CM

## 2012-10-16 DIAGNOSIS — L97509 Non-pressure chronic ulcer of other part of unspecified foot with unspecified severity: Secondary | ICD-10-CM

## 2012-10-16 DIAGNOSIS — L98499 Non-pressure chronic ulcer of skin of other sites with unspecified severity: Secondary | ICD-10-CM

## 2012-10-16 DIAGNOSIS — G8918 Other acute postprocedural pain: Secondary | ICD-10-CM

## 2012-10-16 DIAGNOSIS — E11621 Type 2 diabetes mellitus with foot ulcer: Secondary | ICD-10-CM

## 2012-10-16 DIAGNOSIS — Z23 Encounter for immunization: Secondary | ICD-10-CM

## 2012-10-16 LAB — COMPREHENSIVE METABOLIC PANEL (CC13)
ALT: 8 U/L (ref 0–55)
CO2: 26 mEq/L (ref 22–29)
Calcium: 9 mg/dL (ref 8.4–10.4)
Chloride: 103 mEq/L (ref 98–109)
Glucose: 103 mg/dl (ref 70–140)
Sodium: 137 mEq/L (ref 136–145)
Total Bilirubin: 0.28 mg/dL (ref 0.20–1.20)
Total Protein: 6.7 g/dL (ref 6.4–8.3)

## 2012-10-16 LAB — CBC WITH DIFFERENTIAL/PLATELET
BASO%: 1.1 % (ref 0.0–2.0)
Basophils Absolute: 0.1 10*3/uL (ref 0.0–0.1)
Basophils Relative: 1 % (ref 0–1)
Eosinophils Absolute: 0.5 10*3/uL (ref 0.0–0.7)
Eosinophils Absolute: 0.5 10*3/uL (ref 0.0–0.5)
Eosinophils Relative: 6 % — ABNORMAL HIGH (ref 0–5)
HCT: 30.6 % — ABNORMAL LOW (ref 39.0–52.0)
HCT: 30.2 % — ABNORMAL LOW (ref 38.4–49.9)
LYMPH%: 24.9 % (ref 14.0–49.0)
MCH: 33.3 pg (ref 26.0–34.0)
MCHC: 34 g/dL (ref 30.0–36.0)
MCHC: 33.8 g/dL (ref 32.0–36.0)
MCV: 98.1 fL (ref 78.0–100.0)
MONO#: 0.9 10*3/uL (ref 0.1–0.9)
Monocytes Absolute: 1.2 10*3/uL — ABNORMAL HIGH (ref 0.1–1.0)
NEUT#: 5.1 10*3/uL (ref 1.5–6.5)
NEUT%: 57.8 % (ref 39.0–75.0)
Platelets: 328 10*3/uL (ref 150–400)
Platelets: 316 10*3/uL (ref 140–400)
RBC: 3.05 10*6/uL — ABNORMAL LOW (ref 4.20–5.82)
RDW: 18 % — ABNORMAL HIGH (ref 11.5–15.5)
WBC: 9.9 10*3/uL (ref 4.0–10.5)
WBC: 8.8 10*3/uL (ref 4.0–10.3)
lymph#: 2.2 10*3/uL (ref 0.9–3.3)

## 2012-10-16 LAB — SEDIMENTATION RATE: Sed Rate: 45 mm/hr — ABNORMAL HIGH (ref 0–16)

## 2012-10-16 LAB — BASIC METABOLIC PANEL WITH GFR
BUN: 7 mg/dL (ref 6–23)
CO2: 25 mEq/L (ref 19–32)
Calcium: 8.8 mg/dL (ref 8.4–10.5)
Chloride: 100 mEq/L (ref 96–112)
Creat: 0.82 mg/dL (ref 0.50–1.35)
Glucose, Bld: 97 mg/dL (ref 70–99)

## 2012-10-16 MED ORDER — HYDROCODONE-ACETAMINOPHEN 5-325 MG PO TABS: ORAL_TABLET | ORAL | Status: DC

## 2012-10-16 MED ORDER — SULFAMETHOXAZOLE-TMP DS 800-160 MG PO TABS: 1.0000 | ORAL_TABLET | Freq: Two times a day (BID) | ORAL | Status: DC

## 2012-10-16 NOTE — Telephone Encounter (Signed)
Gave pt appt for lab and MD, for November 2014, gave pt oral contrast

## 2012-10-16 NOTE — Progress Notes (Signed)
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   PRIOR THERAPY:None.   CURRENT THERAPY: Systemic chemotherapy with carboplatin for an AUC of 5 given on day 1 and Abraxane at 100 mg per meter squared given on days 1, 8 and 15 every 3 weeks. Status post 4 cycles, as well as day 1 of cycle 5.   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  CURRENT SMOKING STATUS: Current smoker and was strongly advised to quit smoking and offered smoke cessation materials.  ORAL CHEMOTHERAPY AND CONSENT: None  CURRENT BISPHOSPHONATES USE: None  LIVING WILL AND CODE STATUS: Full code   INTERVAL HISTORY: TORELL MINDER 62 y.o. male returns to the clinic today for followup visit accompanied by his wife. The patient is feeling fine today with no specific complaints. 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 Myra Gianotti. He completed 5 cycles of his systemic chemotherapy and was supposed to start cycle #6 on 08/22/2012 but this was discontinued secondary to his recent hospitalization for the gangrene and bypass surgery. He is feeling much better today. He came for evaluation and discussion of his treatment options. He was seen earlier today by Dr. Algis Liming and was started on treatment with Bactrim DS for one month because of the persistent cellulitis of the left foot. The patient denied having any  significant weight loss or night sweats. He denied having any chest pain, shortness breath, cough or hemoptysis.  MEDICAL HISTORY: Past Medical History  Diagnosis Date  . BENIGN PROSTATIC HYPERTROPHY 12/24/2009  . CEREBROVASCULAR ACCIDENT, HX OF 12/24/2009  . HYPERLIPIDEMIA 12/24/2009  . HYPERTENSION 12/24/2009  . Testosterone deficiency   . ED (erectile dysfunction)   . Claudication in peripheral vascular disease, lifestyle limiting, with abnormal arterial dopplers of lower ext. 06/30/2011  . S/P angioplasty with stent to Lt. SFA 06/30/11 06/30/2011  . COPD 12/24/2009  . Shortness of breath     "from the COPD" (10/13/2011)  . Stroke 1996    "right side not as strong as left since; numbness R leg/foot" (10/13/2011)  . Pernicious anemia   . Complication of anesthesia     hard to wake up  . Lung cancer   . Pneumonia     ALLERGIES:  has No Known Allergies.  MEDICATIONS:  Current Outpatient Prescriptions  Medication Sig Dispense Refill  . acetaminophen (TYLENOL) 500 MG tablet Take 1,000 mg by mouth every 6 (six) hours as needed for pain.       Marland Kitchen aspirin 81 MG tablet Take 81 mg by mouth daily.      Marland Kitchen atorvastatin (LIPITOR) 20 MG tablet Take 20 mg by mouth daily.      . budesonide-formoterol (SYMBICORT) 160-4.5 MCG/ACT inhaler Inhale 2 puffs into the lungs 2 (two) times daily as needed (for shortness of breath).      . feeding supplement (ENSURE COMPLETE) LIQD Take 237 mLs by mouth 2 (two) times  daily between meals.      . gabapentin (NEURONTIN) 600 MG tablet Take 600 mg by mouth every morning.       Marland Kitchen HYDROcodone-acetaminophen (NORCO/VICODIN) 5-325 MG per tablet Take 1 tablet by mouth every 4 (four) hours as needed for pain (every 4 hours prn).      Marland Kitchen lisinopril (PRINIVIL,ZESTRIL) 20 MG tablet Take 20 mg by mouth daily as needed (as needed for increased blood pressure.).       Marland Kitchen nortriptyline (PAMELOR) 75 MG capsule Take 75 mg by mouth at bedtime.      . sulfamethoxazole-trimethoprim (BACTRIM  DS) 800-160 MG per tablet Take 1 tablet by mouth 2 (two) times daily.  60 tablet  1  . tamsulosin (FLOMAX) 0.4 MG CAPS Take 0.4 mg by mouth daily.       Marland Kitchen oxyCODONE (ROXICODONE) 5 MG immediate release tablet Take 1-2 tablets (5-10 mg total) by mouth every 6 (six) hours as needed for pain.  30 tablet  0   No current facility-administered medications for this visit.    SURGICAL HISTORY:  Past Surgical History  Procedure Laterality Date  . Cervical fusion  1980's    "total of 3-4 OR's on my neck; pinched nerve" (10/13/2011)  . Aneurysm coiling  2007  . Peripheral arterial stent graft  06/30/2010; 10/13/10    left; left  . Video bronchoscopy Bilateral 04/03/2012    Procedure: VIDEO BRONCHOSCOPY WITHOUT FLUORO;  Surgeon: Nyoka Cowden, MD;  Location: [facility]COPY;  Service: Cardiopulmonary;  Laterality: Bilateral;  . Amputation Left 08/26/2012    Procedure: IRRIGATION AND DEBRIDMENT LEFT DORSAL ABSCESS/ 4TH AND 5TH LEFT RAY AMPUTATION ;  Surgeon: Toni Arthurs, MD;  Location: [facility] ORS;  Service: Orthopedics;  Laterality: Left;  . Femoral-popliteal bypass graft Left 09/03/2012    Procedure: BYPASS GRAFT FEMORAL-POPLITEAL ARTERY;  Surgeon: Nada Libman, MD;  Location: [facility] OR;  Service: Vascular;  Laterality: Left;  . Cardiac catheterization    . Colonoscopy      hx: of  . I&d extremity Left 09/26/2012    Procedure: IRRIGATION AND DEBRIDEMENT EXTREMITY- LEFT FOOT WITH WOUND VAC PLACEMENT;  Surgeon: Nada Libman, MD;  Location: [facility] OR;  Service: Vascular;  Laterality: Left;    REVIEW OF SYSTEMS:  A comprehensive review of systems was negative except for: Constitutional: positive for fatigue Cellulitis of the left foot.   PHYSICAL EXAMINATION: General appearance: alert, cooperative and no distress Head: Normocephalic, without obvious abnormality, atraumatic Neck: no adenopathy, no JVD, supple, symmetrical, trachea midline and thyroid not enlarged, symmetric, no tenderness/mass/nodules Lymph nodes:  Cervical, supraclavicular, and axillary nodes normal. Resp: clear to auscultation bilaterally Cardio: regular rate and rhythm, S1, S2 normal, no murmur, click, rub or gallop GI: soft, non-tender; bowel sounds normal; no masses,  no organomegaly Extremities: extremities normal, atraumatic, no cyanosis or edema  ECOG PERFORMANCE STATUS: 1 - Symptomatic but completely ambulatory  Blood pressure 104/61, pulse 100, temperature 97.5 F (36.4 C), temperature source Oral, resp. rate 18, height 5\' 6"  (1.676 m), weight 131 lb 1.6 oz (59.467 kg), SpO2 100.00%.  LABORATORY DATA: Lab Results  Component Value Date   WBC 8.8 10/16/2012   HGB 10.2* 10/16/2012   HCT 30.2* 10/16/2012   MCV 99.3* 10/16/2012   PLT 316 10/16/2012      Chemistry      Component Value Date/Time   NA 136 09/27/2012 0600   NA 135* 08/15/2012 1117   K 2.7* 09/27/2012 0600   K 3.4* 08/15/2012 1117  CL 101 09/27/2012 0600   CL 97* 06/13/2012 1300   CO2 27 09/27/2012 0600   CO2 25 08/15/2012 1117   BUN 10 09/27/2012 0600   BUN 7.1 08/15/2012 1117   CREATININE 0.62 09/27/2012 0600   CREATININE 0.7 08/15/2012 1117      Component Value Date/Time   CALCIUM 8.6 09/27/2012 0600   CALCIUM 9.0 08/15/2012 1117   ALKPHOS 128* 08/21/2012 2300   ALKPHOS 134 08/15/2012 1117   AST 9 08/21/2012 2300   AST 11 08/15/2012 1117   ALT 8 08/21/2012 2300   ALT 6 08/15/2012 1117   BILITOT 0.3 08/21/2012 2300   BILITOT 0.24 08/15/2012 1117       RADIOGRAPHIC STUDIES: No results found.  ASSESSMENT AND PLAN: This is a very pleasant 62 years old white male with metastatic non-small cell lung cancer, squamous cell carcinoma status post treatment with chemotherapy in the form of carboplatin and Abraxane completed 5 cycles. The patient has been off treatment for the last 2 months secondary to gangrene of the left foot status post amputation. He is still recovering from his recent surgery and currently under treatment for cellulitis of the left lower extremity. I  recommended for the patient to continue on the antibiotics prescribed by Dr. Algis Liming today. I would see the patient back for followup visit in one month with repeat CT scan of the chest, abdomen and pelvis for restaging of his disease and discussion of his treatment options based on the scan results. The patient and his wife agreed to the current plan. He was advised to call immediately if he has any concerning symptoms in the interval. The patient voices understanding of current disease status and treatment options and is in agreement with the current care plan.  All questions were answered. The patient knows to call the clinic with any problems, questions or concerns. We can certainly see the patient much sooner if necessary.

## 2012-10-16 NOTE — Patient Instructions (Addendum)
Book on the 31st into new pt slot at [facility]

## 2012-10-16 NOTE — Progress Notes (Signed)
Subjective:    Patient ID: [name], male    DOB: Dec 13, 1950, 62 y.o.   MRN: [id number]  HPI   62 y.o. male with Gangrene, abscess, osteomyelitis sp 4th, 5th ray amputaion and I and D of foot abscess. Cultures were no growth (on abx). There were dense major revascularization percutaneously and ultimately the patient underwent vascular surgery on 09/03/12 with:  Left femoral to above-knee popliteal artery bypass with 6 mm propatent PTFE  #2: Harvest, left great saphenous vein  He hadcontinued on IV vancomycin and cefepime and flagyl since then. His wound bed still appears necrotic when I saw him earlier this month withpersistent black necrotic tissue in bed. He was seen by [facility] who performed I and D in hospital and applied wound vacuum. They dc the IV abx and placed him on oral Augmentin 3 times daily which he had taken through last Friday. He had failed to make his followup with me and his home health nurse was concerned by the appearance of the wound at the top of his foot which had having more of a greenish discharge. He also is having more material sucked into his vacuum collection device. He is without fevers chills or nausea or malaise. He does have a ulcer on his heel as well which had a necrotic area that is now come off.  Review of Systems  Constitutional: Negative for fever, chills, diaphoresis, activity change, appetite change, fatigue and unexpected weight change.  HENT: Negative for congestion, sore throat, rhinorrhea, sneezing, trouble swallowing and sinus pressure.   Eyes: Negative for photophobia and visual disturbance.  Respiratory: Negative for cough, chest tightness, shortness of breath, wheezing and stridor.   Cardiovascular: Negative for chest pain, palpitations and leg swelling.  Gastrointestinal: Negative for nausea, vomiting, abdominal pain, diarrhea, constipation, blood in stool, abdominal distention and anal bleeding.  Genitourinary: Negative for dysuria, hematuria,  flank pain and difficulty urinating.  Musculoskeletal: Negative for myalgias, back pain, joint swelling, arthralgias and gait problem.  Skin: Positive for color change and wound. Negative for pallor and rash.  Neurological: Negative for dizziness, tremors, weakness and light-headedness.  Hematological: Negative for adenopathy. Does not bruise/bleed easily.  Psychiatric/Behavioral: Negative for behavioral problems, confusion, sleep disturbance, dysphoric mood, decreased concentration and agitation.       Objective:   Physical Exam  Constitutional: He is oriented to person, place, and time. He appears well-developed and well-nourished. No distress.  HENT:  Head: Normocephalic and atraumatic.  Mouth/Throat: Oropharynx is clear and moist.  Eyes: Conjunctivae and EOM are normal. Pupils are equal, round, and reactive to light. No scleral icterus.  Neck: Normal range of motion. Neck supple.  Cardiovascular: Normal rate, regular rhythm and normal heart sounds.  Exam reveals no gallop and no friction rub.   No murmur heard. Pulmonary/Chest: Effort normal. No respiratory distress. He has wheezes.  Abdominal: He exhibits no distension. There is no tenderness.  Musculoskeletal: He exhibits edema. He exhibits no tenderness.       Feet:  Neurological: He is alert and oriented to person, place, and time.  Skin: Skin is warm and dry. He is not diaphoretic.  Psychiatric: He has a normal mood and affect. Judgment normal.          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 Still with ulcer on the lateral side dorsum and one on heel --culture taken from dorsum of foot --check labs today --start Bactrim DS bid x 30 days  and rtc on 11/15/12  PVD: sp bypass surgery  Flu vaccine need: given  Pain: asked him to take this up with [facility] and or PCP

## 2012-10-16 NOTE — Telephone Encounter (Signed)
Pt. Walked in to clinic to request refill on pain medication.  Stated his pain level is "3-4"/ 10.  States only using approx. 2 doses / day.  Pt. continues to have [facility] RN changing wound vac 3 x / week.  Discussed with Dr. Edilia Bo.  Gave verbal okay to give pt. Hydrocodone/ Acetaminophen 5/325 mg; take 1-2 q 6 hrs/ prn/ pain/ # 30; no refills.  Rx given to pt.

## 2012-10-16 NOTE — Patient Instructions (Signed)
Followup in one month with repeat CT scan of the chest, abdomen and pelvis

## 2012-10-18 ENCOUNTER — Encounter: Payer: Self-pay | Admitting: Surgery

## 2012-10-19 LAB — WOUND CULTURE

## 2012-10-21 ENCOUNTER — Ambulatory Visit (INDEPENDENT_AMBULATORY_CARE_PROVIDER_SITE_OTHER): Payer: Self-pay | Admitting: Surgery

## 2012-10-21 ENCOUNTER — Encounter: Payer: Self-pay | Admitting: Surgery

## 2012-10-21 VITALS — BP 110/73 | HR 102 | Temp 97.7°F | Ht 66.0 in | Wt 134.0 lb

## 2012-10-21 DIAGNOSIS — I739 Peripheral vascular disease, unspecified: Secondary | ICD-10-CM

## 2012-10-21 NOTE — Progress Notes (Signed)
This is a 62 y.o. male with stage IV lung cancer who is s/p left femoral to AK popliteal artery bypass with 6mm proaten PTFE on 09/03/12. He has an extensive wound on the lateral aspect of his left foot. On 09/26/2012, I taken to the operating room for debridement of his left foot wound. He is back today for followup.  He has a wound VAC to the large area on his left foot. There are 2 areas on the dorsum as well as a spot on his left heel and are being treated with wet-to-dry dressing changes. The heel wound is well bothers him the most. He is wearing a Darco shoe which he feels is rubbing the heel  On examination: The large wound that is in the wound VAC shows beefy red granulation tissue. This wound looks excellent. The 2 spots on the dorsum of the wound have a small amount of fibrinous tissue. At roughly the same size. The heel wound appears to have gotten deeper. It is now approximately 1 mm deep with fibrinous tissue at its base.  I am very pleased with the wound on the lateral foot. This appears to be decreasing in size and looks very healthy. There is no bone exposed. The 2 areas on the top of his foot have not significantly changed. Because there is fibrinous tissue present, I am prescribing Santyl ointment. He will continue with the wound VAC to the large wound on the lateral side of the foot. I am most concerned about the heel wound which is the only spot that appears to have progressed. He will place n.p.o. on this area. This is most likely secondary to pressure. He reports that the Darco shoe is rubbing this area. I have told him to go back to the flap support shoe he got in the hospital, and cut out an area in the back of the shoe to accommodate the heel wound so that nothing touches it. He is going to follow up with me in one month.

## 2012-10-22 ENCOUNTER — Other Ambulatory Visit: Payer: Medicare Other | Admitting: Lab

## 2012-10-22 ENCOUNTER — Ambulatory Visit: Payer: Medicare Other | Admitting: Internal Medicine

## 2012-10-23 ENCOUNTER — Other Ambulatory Visit: Payer: Self-pay | Admitting: *Deleted

## 2012-10-23 MED ORDER — ATORVASTATIN CALCIUM 20 MG PO TABS: 20.0000 mg | ORAL_TABLET | Freq: Every day | ORAL | Status: DC

## 2012-10-28 ENCOUNTER — Telehealth: Payer: Self-pay | Admitting: *Deleted

## 2012-10-28 NOTE — Telephone Encounter (Signed)
Dr. Myra Gianotti gave the following orders for [name]:  Wound vac to Left lateral foot to be changed on Monday, Wednesday and Friday ( No Weekend). Santyl is to be applied daily to the 2 wounds on the dorsum of the left foot. Santyl may be applied to the Left heel as needed to clean up slough. I left the message on [name]'s phone [phone]).

## 2012-11-05 ENCOUNTER — Telehealth: Payer: Self-pay | Admitting: Internal Medicine

## 2012-11-05 MED ORDER — LISINOPRIL 20 MG PO TABS: 20.0000 mg | ORAL_TABLET | Freq: Every day | ORAL | Status: DC | PRN

## 2012-11-05 MED ORDER — TAMSULOSIN HCL 0.4 MG PO CAPS: 0.4000 mg | ORAL_CAPSULE | Freq: Every day | ORAL | Status: DC

## 2012-11-05 MED ORDER — NORTRIPTYLINE HCL 75 MG PO CAPS: 75.0000 mg | ORAL_CAPSULE | Freq: Every day | ORAL | Status: DC

## 2012-11-05 MED ORDER — GABAPENTIN 600 MG PO TABS: 600.0000 mg | ORAL_TABLET | ORAL | Status: DC

## 2012-11-05 NOTE — Telephone Encounter (Signed)
Rx's sent to [facility].  

## 2012-11-05 NOTE — Telephone Encounter (Signed)
Pt needs refill of gabapentin 600 mg #90 lisinopril 20 mg #90,flomax 0.4mg  #90 and nortriptyline 75 mg #90 with refills [facility] to [facility]

## 2012-11-06 NOTE — Telephone Encounter (Signed)
Left detailed message Rx's were sent to pharmacy as requested. 

## 2012-11-14 ENCOUNTER — Ambulatory Visit (HOSPITAL_COMMUNITY)
Admission: RE | Admit: 2012-11-14 | Discharge: 2012-11-14 | Disposition: A | Discharge status: Home / Self Care | Payer: Medicare Other | Source: Ambulatory Visit | Attending: Internal Medicine | Admitting: Internal Medicine

## 2012-11-14 ENCOUNTER — Other Ambulatory Visit (HOSPITAL_BASED_OUTPATIENT_CLINIC_OR_DEPARTMENT_OTHER): Payer: Medicare Other | Admitting: Lab

## 2012-11-14 DIAGNOSIS — C3490 Malignant neoplasm of unspecified part of unspecified bronchus or lung: Secondary | ICD-10-CM | POA: Insufficient documentation

## 2012-11-14 DIAGNOSIS — C3410 Malignant neoplasm of upper lobe, unspecified bronchus or lung: Secondary | ICD-10-CM

## 2012-11-14 DIAGNOSIS — J438 Other emphysema: Secondary | ICD-10-CM | POA: Insufficient documentation

## 2012-11-14 DIAGNOSIS — R222 Localized swelling, mass and lump, trunk: Secondary | ICD-10-CM | POA: Insufficient documentation

## 2012-11-14 DIAGNOSIS — M4310 Spondylolisthesis, site unspecified: Secondary | ICD-10-CM | POA: Insufficient documentation

## 2012-11-14 LAB — COMPREHENSIVE METABOLIC PANEL (CC13)
ALT: 10 U/L (ref 0–55)
Albumin: 3.2 g/dL — ABNORMAL LOW (ref 3.5–5.0)
Anion Gap: 9 mEq/L (ref 3–11)
CO2: 19 mEq/L — ABNORMAL LOW (ref 22–29)
Calcium: 10 mg/dL (ref 8.4–10.4)
Chloride: 103 mEq/L (ref 98–109)
Glucose: 82 mg/dl (ref 70–140)
Potassium: 5.8 mEq/L — ABNORMAL HIGH (ref 3.5–5.1)
Sodium: 131 mEq/L — ABNORMAL LOW (ref 136–145)
Total Bilirubin: <0.2 mg/dL (ref 0.20–1.20)
Total Protein: 8.1 g/dL (ref 6.4–8.3)

## 2012-11-14 LAB — CBC WITH DIFFERENTIAL/PLATELET
BASO%: 1.4 % (ref 0.0–2.0)
Eosinophils Absolute: 0.5 10*3/uL (ref 0.0–0.5)
MCHC: 33.2 g/dL (ref 32.0–36.0)
MONO#: 0.6 10*3/uL (ref 0.1–0.9)
NEUT#: 4.4 10*3/uL (ref 1.5–6.5)
RBC: 3.2 10*6/uL — ABNORMAL LOW (ref 4.20–5.82)
RDW: 16.3 % — ABNORMAL HIGH (ref 11.0–14.6)
WBC: 7.3 10*3/uL (ref 4.0–10.3)
lymph#: 1.6 10*3/uL (ref 0.9–3.3)

## 2012-11-14 MED ORDER — IOHEXOL 300 MG/ML  SOLN
80.0000 mL | Freq: Once | INTRAMUSCULAR | Status: AC | PRN
  Administered 2012-11-14: 100 mL via INTRAVENOUS

## 2012-11-14 NOTE — Progress Notes (Signed)
Quick Note:  Call patient with the result and repeat B MET in few days ______ 

## 2012-11-15 ENCOUNTER — Ambulatory Visit (INDEPENDENT_AMBULATORY_CARE_PROVIDER_SITE_OTHER): Payer: Medicare Other | Admitting: Infectious Disease

## 2012-11-15 ENCOUNTER — Encounter: Payer: Self-pay | Admitting: Infectious Disease

## 2012-11-15 ENCOUNTER — Ambulatory Visit
Admission: RE | Admit: 2012-11-15 | Discharge: 2012-11-15 | Disposition: A | Discharge status: Home / Self Care | Payer: Medicare Other | Source: Ambulatory Visit | Attending: Infectious Disease | Admitting: Infectious Disease

## 2012-11-15 ENCOUNTER — Other Ambulatory Visit: Payer: Self-pay | Admitting: Infectious Disease

## 2012-11-15 ENCOUNTER — Telehealth: Payer: Self-pay | Admitting: Medical Oncology

## 2012-11-15 ENCOUNTER — Encounter: Payer: Self-pay | Admitting: Surgery

## 2012-11-15 VITALS — BP 90/56 | HR 109 | Temp 97.6°F | Ht 67.0 in | Wt 135.0 lb

## 2012-11-15 DIAGNOSIS — M869 Osteomyelitis, unspecified: Secondary | ICD-10-CM

## 2012-11-15 DIAGNOSIS — E1140 Type 2 diabetes mellitus with diabetic neuropathy, unspecified: Secondary | ICD-10-CM

## 2012-11-15 DIAGNOSIS — C3490 Malignant neoplasm of unspecified part of unspecified bronchus or lung: Secondary | ICD-10-CM

## 2012-11-15 DIAGNOSIS — C3491 Malignant neoplasm of unspecified part of right bronchus or lung: Secondary | ICD-10-CM

## 2012-11-15 DIAGNOSIS — I70209 Unspecified atherosclerosis of native arteries of extremities, unspecified extremity: Secondary | ICD-10-CM

## 2012-11-15 DIAGNOSIS — E1149 Type 2 diabetes mellitus with other diabetic neurological complication: Secondary | ICD-10-CM

## 2012-11-15 DIAGNOSIS — M9080 Osteopathy in diseases classified elsewhere, unspecified site: Secondary | ICD-10-CM

## 2012-11-15 DIAGNOSIS — E1151 Type 2 diabetes mellitus with diabetic peripheral angiopathy without gangrene: Secondary | ICD-10-CM

## 2012-11-15 DIAGNOSIS — E1159 Type 2 diabetes mellitus with other circulatory complications: Secondary | ICD-10-CM

## 2012-11-15 DIAGNOSIS — E1169 Type 2 diabetes mellitus with other specified complication: Secondary | ICD-10-CM

## 2012-11-15 DIAGNOSIS — E875 Hyperkalemia: Secondary | ICD-10-CM

## 2012-11-15 DIAGNOSIS — E11621 Type 2 diabetes mellitus with foot ulcer: Secondary | ICD-10-CM

## 2012-11-15 DIAGNOSIS — E1142 Type 2 diabetes mellitus with diabetic polyneuropathy: Secondary | ICD-10-CM

## 2012-11-15 DIAGNOSIS — L97509 Non-pressure chronic ulcer of other part of unspecified foot with unspecified severity: Secondary | ICD-10-CM

## 2012-11-15 DIAGNOSIS — C801 Malignant (primary) neoplasm, unspecified: Secondary | ICD-10-CM

## 2012-11-15 LAB — BASIC METABOLIC PANEL WITH GFR
BUN: 12 mg/dL (ref 6–23)
CO2: 21 mEq/L (ref 19–32)
Chloride: 98 mEq/L (ref 96–112)
GFR, Est African American: >89 mL/min
Glucose, Bld: 73 mg/dL (ref 70–99)
Potassium: 5.5 mEq/L — ABNORMAL HIGH (ref 3.5–5.3)
Sodium: 130 mEq/L — ABNORMAL LOW (ref 135–145)

## 2012-11-15 MED ORDER — SULFAMETHOXAZOLE-TMP DS 800-160 MG PO TABS: 1.0000 | ORAL_TABLET | Freq: Two times a day (BID) | ORAL | Status: DC

## 2012-11-15 MED ORDER — DOXYCYCLINE HYCLATE 100 MG PO TABS: 100.0000 mg | ORAL_TABLET | Freq: Two times a day (BID) | ORAL | Status: DC

## 2012-11-15 NOTE — Telephone Encounter (Signed)
Spoke to [place] and notified her of pts high potassium, Dr Lattie Haw nurse notified and they will redraw his potassium today

## 2012-11-15 NOTE — Telephone Encounter (Signed)
Spoke to Dr Lattie Haw nurse and she will get pt back for labs to recheck potassium.Pts wife notified too.

## 2012-11-15 NOTE — Telephone Encounter (Signed)
Message copied by Charma Igo on Fri Nov 15, 2012  9:37 AM ------      Message from: Si Gaul      Created: Thu Nov 14, 2012  6:22 PM       Call patient with the result and repeat B MET in few days. ------

## 2012-11-15 NOTE — Progress Notes (Signed)
Subjective:    Patient ID: [name], male    DOB: 01-Mar-1950, 62 y.o.   MRN: [id number]  HPI   62 y.o. male with Gangrene, abscess, osteomyelitis sp 4th, 5th ray amputaion and I and D of foot abscess. Cultures were no growth (on abx). There were dense major revascularization percutaneously and ultimately the patient underwent vascular surgery on 09/03/12 with:  Left femoral to above-knee popliteal artery bypass with 6 mm propatent PTFE  #2: Harvest, left great saphenous vein  He hadcontinued on IV vancomycin and cefepime and flagyl since then. His wound bed still appears necrotic when I saw him earlier this month withpersistent black necrotic tissue in bed. He was seen by [facility] who performed I and D in hospital and applied wound vacuum. They dc the IV abx and placed him on oral Augmentin 3 times daily which he had taken through last Friday. He had failed to make his followup with me and his home health nurse was concerned by the appearance of the wound at the top of his foot which had having more of a greenish discharge. He also is having more material sucked into his vacuum collection device. He is without fevers chills or nausea or malaise. He does have a ulcer on his heel as well which had a necrotic area thaT HAD  now come off. I saw him one month ago and placed him on bactrim DS po bid and he has continued on this. Dr. Shirline Frees has held off on reinstituting chemo n light of his recent illnesses. Pt has seen Dr. Myra Gianotti who is happy with progress of the foot wounds with the exception of the heel ulcer.   Review of Systems  Constitutional: Negative for fever, chills, diaphoresis, activity change, appetite change, fatigue and unexpected weight change.  HENT: Negative for congestion, rhinorrhea, sinus pressure, sneezing, sore throat and trouble swallowing.   Eyes: Negative for photophobia and visual disturbance.  Respiratory: Negative for cough, chest tightness, shortness of breath, wheezing  and stridor.   Cardiovascular: Negative for chest pain, palpitations and leg swelling.  Gastrointestinal: Negative for nausea, vomiting, abdominal pain, diarrhea, constipation, blood in stool, abdominal distention and anal bleeding.  Genitourinary: Negative for dysuria, hematuria, flank pain and difficulty urinating.  Musculoskeletal: Negative for arthralgias, back pain, gait problem, joint swelling and myalgias.  Skin: Positive for color change and wound. Negative for pallor and rash.  Neurological: Negative for dizziness, tremors, weakness and light-headedness.  Hematological: Negative for adenopathy. Does not bruise/bleed easily.  Psychiatric/Behavioral: Negative for behavioral problems, confusion, sleep disturbance, dysphoric mood, decreased concentration and agitation.       Objective:   Physical Exam  Constitutional: He is oriented to person, place, and time. He appears well-developed and well-nourished. No distress.  HENT:  Head: Normocephalic and atraumatic.  Mouth/Throat: Oropharynx is clear and moist.  Eyes: Conjunctivae and EOM are normal. Pupils are equal, round, and reactive to light. No scleral icterus.  Neck: Normal range of motion. Neck supple.  Cardiovascular: Normal rate, regular rhythm and normal heart sounds.  Exam reveals no gallop and no friction rub.   No murmur heard. Pulmonary/Chest: Effort normal. No respiratory distress. He has wheezes.  Abdominal: He exhibits no distension. There is no tenderness.  Musculoskeletal: He exhibits edema. He exhibits no tenderness.       Feet:  Neurological: He is alert and oriented to person, place, and time.  Skin: Skin is warm and dry. He is not diaphoretic.  Psychiatric: He has a normal  mood and affect. Judgment normal.          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 Still with ulcer on the lateral side dorsum and one on heel  -change to doxycyline 100mg  po bid (given  hyperkalemia on bactrim) and continue thru to December  --check xray of the foot and heel, repeat BMP , esr, crp today PVD due to DM, smoking: sp bypass surgery  I spent greater than 45 minutes with the patient including greater than 50% of time in face to face counsel of the patient and in coordination of their care.   Hyperkalemia on labs done yesterday with Oncology: recheck K today in clinic with stat labs, change bactrim to doxy, dc the ACEI, if K still very high will need to go to ED for rx  Neuropathy due DM  Metastatic non-small cell lung cancer, squamous cell carcinoma: being followed by Dr. Shirline Frees and chemo being held for now  Need for flu shot: was going to give him one but he appears to have left already

## 2012-11-15 NOTE — Telephone Encounter (Signed)
Message copied by Charma Igo on Fri Nov 15, 2012  9:50 AM ------      Message from: Si Gaul      Created: Thu Nov 14, 2012  6:22 PM       Call patient with the result and repeat B MET in few days. ------

## 2012-11-18 ENCOUNTER — Ambulatory Visit (HOSPITAL_BASED_OUTPATIENT_CLINIC_OR_DEPARTMENT_OTHER): Payer: Medicare Other | Admitting: Internal Medicine

## 2012-11-18 ENCOUNTER — Other Ambulatory Visit: Payer: Self-pay | Admitting: Licensed Clinical Social Worker

## 2012-11-18 ENCOUNTER — Ambulatory Visit (INDEPENDENT_AMBULATORY_CARE_PROVIDER_SITE_OTHER): Payer: Self-pay | Admitting: Surgery

## 2012-11-18 ENCOUNTER — Encounter: Payer: Self-pay | Admitting: Internal Medicine

## 2012-11-18 ENCOUNTER — Encounter: Payer: Self-pay | Admitting: Surgery

## 2012-11-18 VITALS — BP 142/72 | HR 88 | Ht 67.0 in | Wt 136.3 lb

## 2012-11-18 DIAGNOSIS — M869 Osteomyelitis, unspecified: Secondary | ICD-10-CM

## 2012-11-18 DIAGNOSIS — C3410 Malignant neoplasm of upper lobe, unspecified bronchus or lung: Secondary | ICD-10-CM

## 2012-11-18 DIAGNOSIS — C778 Secondary and unspecified malignant neoplasm of lymph nodes of multiple regions: Secondary | ICD-10-CM

## 2012-11-18 DIAGNOSIS — I739 Peripheral vascular disease, unspecified: Secondary | ICD-10-CM

## 2012-11-18 DIAGNOSIS — S98139A Complete traumatic amputation of one unspecified lesser toe, initial encounter: Secondary | ICD-10-CM

## 2012-11-18 MED ORDER — DOXYCYCLINE HYCLATE 100 MG PO TABS: 100.0000 mg | ORAL_TABLET | Freq: Two times a day (BID) | ORAL | Status: DC

## 2012-11-18 NOTE — Progress Notes (Signed)
[facility] [phone]   [phone]  OFFICE PROGRESS NOTE  [name], MD [address] [place] [address]  DIAGNOSIS: Metastatic non-small cell lung cancer, squamous cell carcinoma diagnosed in March 2014.   PRIOR THERAPY:None.   CURRENT THERAPY: Systemic chemotherapy with carboplatin for an AUC of 5 given on day 1 and Abraxane at 100 mg per meter squared given on days 1, 8 and 15 every 3 weeks. Status post 4 cycles, as well as day 1 of cycle 5.   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  CURRENT SMOKING STATUS: Current smoker and was strongly advised to quit smoking and offered smoke cessation materials.  ORAL CHEMOTHERAPY AND CONSENT: None  CURRENT BISPHOSPHONATES USE: None  LIVING WILL AND CODE STATUS: Full code   INTERVAL HISTORY:  [name] 62 y.o. male returns to the clinic today for followup visit accompanied by his wife. The patient is feeling fine today with no specific complaints. 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 Myra Gianotti. He completed 5 cycles of his systemic chemotherapy and was supposed to start cycle #6 on 08/22/2012 but this was discontinued secondary to his recent hospitalization for the gangrene and bypass surgery. He is feeling much better today. He came for evaluation and discussion of his treatment options. He was seen by Dr. Algis Liming and was started on treatment with Bactrim DS for one month because of the persistent cellulitis of the left foot. The patient denied having any significant  weight loss or night sweats. He denied having any chest pain, shortness breath, cough or hemoptysis. The patient came today for evaluation after repeating CT scan of the chest, abdomen and pelvis. He is feeling fine with no specific complaints. His potassium has been elevated recently and the patient discontinued treatment with lisinopril as well as Bactrim.  INTERVAL HISTORY: [name] 62 y.o. male returns to the clinic today for   MEDICAL HISTORY: Past Medical History  Diagnosis Date  . BENIGN PROSTATIC HYPERTROPHY 12/24/2009  . CEREBROVASCULAR ACCIDENT, HX OF 12/24/2009  . HYPERLIPIDEMIA 12/24/2009  . HYPERTENSION 12/24/2009  . Testosterone deficiency   . ED (erectile dysfunction)   . Claudication in peripheral vascular disease, lifestyle limiting, with abnormal arterial dopplers of lower ext. 06/30/2011  . S/P angioplasty with stent to Lt. SFA 06/30/11 06/30/2011  . COPD 12/24/2009  . Shortness of breath     "from the COPD" (10/13/2011)  . Stroke 1996    "right side not as strong as left since; numbness R leg/foot" (10/13/2011)  . Pernicious anemia   . Complication of anesthesia     hard to wake up  . Lung cancer   . Pneumonia     ALLERGIES:  has No Known Allergies.  MEDICATIONS:  Current Outpatient Prescriptions  Medication Sig Dispense Refill  . acetaminophen (TYLENOL) 500 MG tablet Take 1,000 mg by mouth every 6 (six) hours as needed for pain.       Marland Kitchen aspirin 81 MG tablet Take 81 mg by mouth daily.      Marland Kitchen atorvastatin (  LIPITOR) 20 MG tablet Take 1 tablet (20 mg total) by mouth daily.  90 tablet  1  . budesonide-formoterol (SYMBICORT) 160-4.5 MCG/ACT inhaler Inhale 2 puffs into the lungs 2 (two) times daily as needed (for shortness of breath).      . doxycycline (VIBRA-TABS) 100 MG tablet Take 1 tablet (100 mg total) by mouth 2 (two) times daily.  60 tablet  3  . feeding supplement (ENSURE COMPLETE) LIQD Take 237 mLs by mouth 2 (two) times daily between meals.      .  gabapentin (NEURONTIN) 600 MG tablet Take 1 tablet (600 mg total) by mouth every morning.  90 tablet  1  . HYDROcodone-acetaminophen (NORCO/VICODIN) 5-325 MG per tablet Take 1 tablet by mouth every 4 (four) hours as needed for pain (every 4 hours prn).      Marland Kitchen lisinopril (PRINIVIL,ZESTRIL) 20 MG tablet Take 1 tablet (20 mg total) by mouth daily as needed (as needed for increased blood pressure.).  90 tablet  1  . nortriptyline (PAMELOR) 75 MG capsule Take 1 capsule (75 mg total) by mouth at bedtime.  90 capsule  1  . oxyCODONE (ROXICODONE) 5 MG immediate release tablet Take 1-2 tablets (5-10 mg total) by mouth every 6 (six) hours as needed for pain.  30 tablet  0  . tamsulosin (FLOMAX) 0.4 MG CAPS capsule Take 1 capsule (0.4 mg total) by mouth daily.  90 capsule  1   No current facility-administered medications for this visit.    SURGICAL HISTORY:  Past Surgical History  Procedure Laterality Date  . Cervical fusion  1980's    "total of 3-4 OR's on my neck; pinched nerve" (10/13/2011)  . Aneurysm coiling  2007  . Peripheral arterial stent graft  06/30/2010; 10/13/10    left; left  . Video bronchoscopy Bilateral 04/03/2012    Procedure: VIDEO BRONCHOSCOPY WITHOUT FLUORO;  Surgeon: Nyoka Cowden, MD;  Location: [facility];  Service: Cardiopulmonary;  Laterality: Bilateral;  . Amputation Left 08/26/2012    Procedure: IRRIGATION AND DEBRIDMENT LEFT DORSAL ABSCESS/ 4TH AND 5TH LEFT RAY AMPUTATION ;  Surgeon: Toni Arthurs, MD;  Location: [facility];  Service: Orthopedics;  Laterality: Left;  . Femoral-popliteal bypass graft Left 09/03/2012    Procedure: BYPASS GRAFT FEMORAL-POPLITEAL ARTERY;  Surgeon: Nada Libman, MD;  Location: [facility];  Service: Vascular;  Laterality: Left;  . Cardiac catheterization    . Colonoscopy      hx: of  . I&d extremity Left 09/26/2012    Procedure: IRRIGATION AND DEBRIDEMENT EXTREMITY- LEFT FOOT WITH WOUND VAC PLACEMENT;  Surgeon: Nada Libman, MD;  Location: [facility] OR;   Service: Vascular;  Laterality: Left;    REVIEW OF SYSTEMS:  Constitutional: positive for fatigue Eyes: negative Ears, nose, mouth, throat, and face: negative Respiratory: positive for dyspnea on exertion Cardiovascular: negative Gastrointestinal: negative Genitourinary:negative Integument/breast: negative Hematologic/lymphatic: negative Musculoskeletal:negative Neurological: negative Behavioral/Psych: negative Endocrine: negative Allergic/Immunologic: negative   PHYSICAL EXAMINATION: General appearance: alert, cooperative, appears stated age, fatigued and no distress Head: Normocephalic, without obvious abnormality, atraumatic Neck: no adenopathy, no JVD, supple, symmetrical, trachea midline and thyroid not enlarged, symmetric, no tenderness/mass/nodules Lymph nodes: Cervical, supraclavicular, and axillary nodes normal. Resp: clear to auscultation bilaterally Back: symmetric, no curvature. ROM normal. No CVA tenderness. Cardio: regular rate and rhythm, S1, S2 normal, no murmur, click, rub or gallop GI: soft, non-tender; bowel sounds normal; no masses,  no organomegaly Extremities: edema 1+ left foot Neurologic: Alert and oriented X 3, normal strength and  tone. Normal symmetric reflexes. Normal coordination and gait  ECOG PERFORMANCE STATUS: 1 - Symptomatic but completely ambulatory  Blood pressure 104/69, pulse 104, temperature 97.4 F (36.3 C), temperature source Oral, resp. rate 18, height 5\' 7"  (1.702 m), SpO2 96.00%.  LABORATORY DATA: Lab Results  Component Value Date   WBC 7.3 11/14/2012   HGB 10.5* 11/14/2012   HCT 31.6* 11/14/2012   MCV 98.8* 11/14/2012   PLT 265 11/14/2012      Chemistry      Component Value Date/Time   NA 130* 11/28/2012 1009   NA 131* 11/14/2012 0902   K 5.5* 28-Nov-2012 1009   K 5.8* 11/14/2012 0902   CL 98 11-28-12 1009   CL 97* 06/13/2012 1300   CO2 21 11-28-12 1009   CO2 19* 11/14/2012 0902   BUN 12 28-Nov-2012 1009   BUN 13.9  11/14/2012 0902   CREATININE 1.02 11/28/12 1009   CREATININE 1.1 11/14/2012 0902   CREATININE 0.62 09/27/2012 0600      Component Value Date/Time   CALCIUM 9.5 2012/11/28 1009   CALCIUM 10.0 11/14/2012 0902   ALKPHOS 156* 11/14/2012 0902   ALKPHOS 128* 08/21/2012 2300   AST 16 11/14/2012 0902   AST 9 08/21/2012 2300   ALT 10 11/14/2012 0902   ALT 8 08/21/2012 2300   BILITOT <0.20 11/14/2012 0902   BILITOT 0.3 08/21/2012 2300       RADIOGRAPHIC STUDIES: Dg Ankle Complete Left  11/28/2012   CLINICAL DATA:  History of osteomyelitis  EXAM: LEFT ANKLE COMPLETE - 3+ VIEW  COMPARISON:  None.  FINDINGS: No acute fracture or dislocation. 4th and 5th metatarsal amputation is partially imaged.  IMPRESSION: No evidence of acute bony pathology in the ankle joint region.   Electronically Signed   By: Maryclare Bean M.D.   On: Nov 28, 2012 10:20   Ct Chest W Contrast  11/14/2012   CLINICAL DATA:  Restaging for metastatic lung carcinoma.  EXAM: CT CHEST, ABDOMEN, AND PELVIS WITH CONTRAST  TECHNIQUE: Multidetector CT imaging of the chest, abdomen and pelvis was performed following the standard protocol during bolus administration of intravenous contrast.  CONTRAST:  OMNIPAQUE IOHEXOL 300 MG/ML  SOLN  COMPARISON:  07/08/2012  FINDINGS:   CT CHEST FINDINGS  Mediastinal soft tissue density in the AP window has further decreased in size since previous study, currently measuring 1.2 x 2.0 cm on image 27 compared to 2.6 x 2.8 cm previously. No other sites of mediastinal or hilar lymphadenopathy identified. No adenopathy seen elsewhere within the thorax.  Previously seen bilateral pulmonary nodular opacities have nearly completely resolved, consistent with resolving infectious or inflammatory process. Mild emphysema again noted. A cluster of tiny nodules persists in the left upper lobe, largest measuring 7 mm on image 15, which is stable since previous study. No evidence of pleural or pericardial effusion. No  suspicious bone lesions identified.    CT ABDOMEN AND PELVIS FINDINGS  The liver, spleen, pancreas, adrenal glands, and kidneys are normal in appearance. No evidence of hydronephrosis. No soft tissue masses or lymphadenopathy identified within the abdomen or pelvis.  No evidence of inflammatory process or abnormal fluid collections. No evidence of bowel wall thickening, dilatation, or hernia. No suspicious bone lesions are identified. Again noted are severe lower lumbar spine degenerative changes, with bilateral L5 pars defects and grade 2 anterolisthesis at L5-S1.  IMPRESSION: Further decrease in mediastinal mass since previous study.  Near complete resolution of bilateral pulmonary nodular densities, consistent with resolving infectious or  inflammatory process. Emphysema again noted.  No new or progressive disease identified. No evidence of abdominal or pelvic metastatic disease.   Electronically Signed   By: Myles Rosenthal M.D.   On: 11/14/2012 10:24   Dg Foot Complete Left  11/15/2012   CLINICAL DATA:  History of osteomyelitis.  Ulcer.  EXAM: LEFT FOOT - COMPLETE 3+ VIEW  COMPARISON:  08/21/2012  FINDINGS: The mid and distal 4th and 5th metatarsals as well as the phalanges have been amputated. There is now 0 erosion of the head of the 3rd metatarsal. Periosteum reaction is suspected. A destructive process such as osteomyelitis cannot be excluded. There is an ulcer adjacent to the head of the 3rd metatarsal.  IMPRESSION: Status post amputation of the 4th and 5th rays.  Destructive process involving   Electronically Signed   By: Maryclare Bean M.D.   On: 11/15/2012 10:23    ASSESSMENT AND PLAN: This is a very pleasant 62 years old white male with metastatic non-small cell lung cancer, squamous cell carcinoma status post treatment with chemotherapy in the form of carboplatin and Abraxane completed 5 cycles.  The patient has been off treatment for the last 3 months secondary to gangrene of the left foot status  post amputation.  He is still under treatment for cellulitis of the left lower extremity.  I recommended for the patient to continue on the antibiotics prescribed by Dr. Algis Liming today.  His recent CT scan of the chest, abdomen and pelvis showed mild improvement in his disease with decreased in the mediastinal lymphadenopathy and resolution of the pulmonary nodules. I discussed the scan results with the patient and his wife. I recommended for him to continue on observation with repeat CT scan of the chest, abdomen and pelvis in 3 months. The patient and his wife agreed to the current plan.  He was advised to call immediately if he has any concerning symptoms in the interval.  The patient voices understanding of current disease status and treatment options and is in agreement with the current care plan.  All questions were answered. The patient knows to call the clinic with any problems, questions or concerns. We can certainly see the patient much sooner if necessary.  I spent 15 minutes counseling the patient face to face. The total time spent in the appointment was 25 minutes.

## 2012-11-18 NOTE — Progress Notes (Signed)
The patient is back today for followup.  He is status post left femoral to above-knee popliteal artery bypass graft with 6 mm Gore-Tex on 09/03/2012.  He has an extensive wound on the lateral aspect of his foot and underwent operative debridement on 09/26/2012.  He continues to have a wound VAC on this area.  There is also a heel ulcer which I am placing Santyl on.  The wound on his foot continues to decrease in size.  There was an area where there was cartilage exposed.  I tried to remove this in the office today.  The heel ulcer has also decreased in size and is more superficial.  The patient will continue with Santyl on his heel wound and wound VAC on the foot wound.  He'll follow up in one month.  If there is continued bone exposed on the foot wound this will need to be resected in the operating room.

## 2012-11-18 NOTE — Patient Instructions (Signed)
Follow up visit in 3 months with repeat CT scan of the chest, abdomen and pelvis 

## 2012-11-20 ENCOUNTER — Ambulatory Visit: Payer: Medicare Other | Admitting: Oncology

## 2012-11-20 ENCOUNTER — Other Ambulatory Visit: Payer: Medicare Other | Admitting: Lab

## 2012-11-21 ENCOUNTER — Other Ambulatory Visit: Payer: Self-pay

## 2012-12-05 ENCOUNTER — Encounter: Payer: Self-pay | Admitting: Internal Medicine

## 2012-12-05 ENCOUNTER — Ambulatory Visit (INDEPENDENT_AMBULATORY_CARE_PROVIDER_SITE_OTHER): Payer: Medicare Other | Admitting: Internal Medicine

## 2012-12-05 VITALS — BP 140/80 | HR 98 | Temp 97.4°F | Resp 20 | Wt 141.0 lb

## 2012-12-05 DIAGNOSIS — Z23 Encounter for immunization: Secondary | ICD-10-CM

## 2012-12-05 DIAGNOSIS — J4489 Other specified chronic obstructive pulmonary disease: Secondary | ICD-10-CM

## 2012-12-05 DIAGNOSIS — E785 Hyperlipidemia, unspecified: Secondary | ICD-10-CM

## 2012-12-05 DIAGNOSIS — C3490 Malignant neoplasm of unspecified part of unspecified bronchus or lung: Secondary | ICD-10-CM

## 2012-12-05 DIAGNOSIS — J449 Chronic obstructive pulmonary disease, unspecified: Secondary | ICD-10-CM

## 2012-12-05 DIAGNOSIS — I10 Essential (primary) hypertension: Secondary | ICD-10-CM

## 2012-12-05 LAB — BASIC METABOLIC PANEL
CO2: 30 mEq/L (ref 19–32)
Calcium: 9.4 mg/dL (ref 8.4–10.5)
Chloride: 99 mEq/L (ref 96–112)
Creatinine, Ser: 1.1 mg/dL (ref 0.4–1.5)
GFR: 75.74 mL/min (ref >60.00)
Sodium: 135 mEq/L (ref 135–145)

## 2012-12-05 NOTE — Progress Notes (Signed)
Subjective:    Patient ID: ABRHAM [name], male    DOB: 02-Jan-1951, 62 y.o.   MRN: [id number]  HPI  62 year old patient who is seen today for followup.  He is followed closely by VVS and ID as well as oncology.  Recent laboratory studies revealed mild hyperkalemia in the setting of Bactrim and lisinopril use. He has normal renal function. In general feels quite well today. He has nursing home assistance 3 times weekly to assist with  wound healing. He generally feels reasonably well although still having some left foot pain related to active ulceration  Past Medical History  Diagnosis Date  . BENIGN PROSTATIC HYPERTROPHY 12/24/2009  . CEREBROVASCULAR ACCIDENT, HX OF 12/24/2009  . HYPERLIPIDEMIA 12/24/2009  . HYPERTENSION 12/24/2009  . Testosterone deficiency   . ED (erectile dysfunction)   . Claudication in peripheral vascular disease, lifestyle limiting, with abnormal arterial dopplers of lower ext. 06/30/2011  . S/P angioplasty with stent to Lt. SFA 06/30/11 06/30/2011  . COPD 12/24/2009  . Shortness of breath     "from the COPD" (10/13/2011)  . Stroke 1996    "right side not as strong as left since; numbness R leg/foot" (10/13/2011)  . Pernicious anemia   . Complication of anesthesia     hard to wake up  . Lung cancer   . Pneumonia     History   Social History  . Marital Status: Married    Spouse Name: N/A    Number of Children: 3  . Years of Education: N/A   Occupational History  . Retired     Airline pilot   Social History Main Topics  . Smoking status: Current Every Day Smoker -- 0.60 packs/day for 45 years    Types: Cigarettes  . Smokeless tobacco: Never Used  . Alcohol Use: No  . Drug Use: No  . Sexual Activity: Not Currently   Other Topics Concern  . Not on file   Social History Narrative  . No narrative on file    Past Surgical History  Procedure Laterality Date  . Cervical fusion  1980's    "total of 3-4 OR's on my neck; pinched nerve" (10/13/2011)  . Aneurysm  coiling  2007  . Peripheral arterial stent graft  06/30/2010; 10/13/10    left; left  . Video bronchoscopy Bilateral 04/03/2012    Procedure: VIDEO BRONCHOSCOPY WITHOUT FLUORO;  Surgeon: Nyoka Cowden, MD;  Location: [facility];  Service: Cardiopulmonary;  Laterality: Bilateral;  . Amputation Left 08/26/2012    Procedure: IRRIGATION AND DEBRIDMENT LEFT DORSAL ABSCESS/ 4TH AND 5TH LEFT RAY AMPUTATION ;  Surgeon: Toni Arthurs, MD;  Location: [facility];  Service: Orthopedics;  Laterality: Left;  . Femoral-popliteal bypass graft Left 09/03/2012    Procedure: BYPASS GRAFT FEMORAL-POPLITEAL ARTERY;  Surgeon: Nada Libman, MD;  Location: [facility] OR;  Service: Vascular;  Laterality: Left;  . Cardiac catheterization    . Colonoscopy      hx: of  . I&d extremity Left 09/26/2012    Procedure: IRRIGATION AND DEBRIDEMENT EXTREMITY- LEFT FOOT WITH WOUND VAC PLACEMENT;  Surgeon: Nada Libman, MD;  Location: [facility] OR;  Service: Vascular;  Laterality: Left;    Family History  Problem Relation Age of Onset  . Emphysema Father     smoked  . Emphysema Paternal Uncle     smoked  . Heart disease Mother   . Lung cancer Father     smoked  . Lung cancer Paternal Grandfather     smoked  No Known Allergies  Current Outpatient Prescriptions on File Prior to Visit  Medication Sig Dispense Refill  . acetaminophen (TYLENOL) 500 MG tablet Take 1,000 mg by mouth every 6 (six) hours as needed for pain.       Marland Kitchen aspirin 81 MG tablet Take 81 mg by mouth daily.      Marland Kitchen atorvastatin (LIPITOR) 20 MG tablet Take 1 tablet (20 mg total) by mouth daily.  90 tablet  1  . budesonide-formoterol (SYMBICORT) 160-4.5 MCG/ACT inhaler Inhale 2 puffs into the lungs 2 (two) times daily as needed (for shortness of breath).      . doxycycline (VIBRA-TABS) 100 MG tablet Take 1 tablet (100 mg total) by mouth 2 (two) times daily.  60 tablet  3  . feeding supplement (ENSURE COMPLETE) LIQD Take 237 mLs by mouth 2 (two) times daily between meals.       . gabapentin (NEURONTIN) 600 MG tablet Take 1 tablet (600 mg total) by mouth every morning.  90 tablet  1  . nortriptyline (PAMELOR) 75 MG capsule Take 1 capsule (75 mg total) by mouth at bedtime.  90 capsule  1  . oxyCODONE (ROXICODONE) 5 MG immediate release tablet Take 1-2 tablets (5-10 mg total) by mouth every 6 (six) hours as needed for pain.  30 tablet  0  . tamsulosin (FLOMAX) 0.4 MG CAPS capsule Take 1 capsule (0.4 mg total) by mouth daily.  90 capsule  1  . HYDROcodone-acetaminophen (NORCO/VICODIN) 5-325 MG per tablet Take 1 tablet by mouth every 4 (four) hours as needed for pain (every 4 hours prn).       No current facility-administered medications on file prior to visit.    BP 140/80  Pulse 98  Temp(Src) 97.4 F (36.3 C) (Oral)  Resp 20  Wt 141 lb (63.957 kg)  SpO2 96%      Review of Systems  Constitutional: Negative for fever, chills, appetite change and fatigue.  HENT: Negative for congestion, dental problem, ear pain, hearing loss, sore throat, tinnitus, trouble swallowing and voice change.   Eyes: Negative for pain, discharge and visual disturbance.  Respiratory: Positive for cough and shortness of breath. Negative for chest tightness, wheezing and stridor.   Cardiovascular: Negative for chest pain, palpitations and leg swelling.  Gastrointestinal: Negative for nausea, vomiting, abdominal pain, diarrhea, constipation, blood in stool and abdominal distention.  Genitourinary: Negative for urgency, hematuria, flank pain, discharge, difficulty urinating and genital sores.  Musculoskeletal: Positive for arthralgias. Negative for back pain, gait problem, joint swelling, myalgias and neck stiffness.  Skin: Negative for rash.  Neurological: Negative for dizziness, syncope, speech difficulty, weakness, numbness and headaches.  Hematological: Negative for adenopathy. Does not bruise/bleed easily.  Psychiatric/Behavioral: Negative for behavioral problems and dysphoric mood.  The patient is not nervous/anxious.        Objective:   Physical Exam  Constitutional: He is oriented to person, place, and time. He appears well-developed.  HENT:  Head: Normocephalic.  Right Ear: External ear normal.  Left Ear: External ear normal.  Eyes: Conjunctivae and EOM are normal.  Neck: Normal range of motion.  Cardiovascular: Normal rate and normal heart sounds.   Pulmonary/Chest: Effort normal. No respiratory distress.  Bilateral rhonchi Slight diminished breath sounds left base  Abdominal: Bowel sounds are normal.  Musculoskeletal: Normal range of motion. He exhibits no edema and no tenderness.  Left foot bandaged with wound VAC in place  Neurological: He is alert and oriented to person, place, and time.  Psychiatric:  He has a normal mood and affect. His behavior is normal.          Assessment & Plan:  CAD stable Lung cancer. Followup oncology. Last imaging study stable scheduled for 3 months CAD stable PAD Hyperkalemia   Flu vaccine administered Recheck potassium

## 2012-12-05 NOTE — Patient Instructions (Signed)
Limit your sodium (Salt) intake  Return in 4 months for follow-up  

## 2012-12-06 ENCOUNTER — Ambulatory Visit: Payer: Medicare Other | Admitting: Internal Medicine

## 2012-12-11 ENCOUNTER — Encounter: Payer: Self-pay | Admitting: Surgery

## 2012-12-16 ENCOUNTER — Ambulatory Visit (INDEPENDENT_AMBULATORY_CARE_PROVIDER_SITE_OTHER): Payer: Self-pay | Admitting: Surgery

## 2012-12-16 ENCOUNTER — Encounter: Payer: Self-pay | Admitting: Internal Medicine

## 2012-12-16 ENCOUNTER — Ambulatory Visit (INDEPENDENT_AMBULATORY_CARE_PROVIDER_SITE_OTHER): Payer: Medicare Other | Admitting: Internal Medicine

## 2012-12-16 ENCOUNTER — Encounter: Payer: Self-pay | Admitting: Surgery

## 2012-12-16 ENCOUNTER — Other Ambulatory Visit: Payer: Self-pay | Admitting: *Deleted

## 2012-12-16 VITALS — BP 100/60 | HR 126 | Temp 98.1°F | Resp 20 | Wt 139.0 lb

## 2012-12-16 VITALS — BP 162/74 | HR 130 | Temp 98.6°F | Ht 67.0 in | Wt 139.0 lb

## 2012-12-16 DIAGNOSIS — J449 Chronic obstructive pulmonary disease, unspecified: Secondary | ICD-10-CM

## 2012-12-16 DIAGNOSIS — C3490 Malignant neoplasm of unspecified part of unspecified bronchus or lung: Secondary | ICD-10-CM

## 2012-12-16 DIAGNOSIS — I739 Peripheral vascular disease, unspecified: Secondary | ICD-10-CM

## 2012-12-16 DIAGNOSIS — M869 Osteomyelitis, unspecified: Secondary | ICD-10-CM

## 2012-12-16 DIAGNOSIS — Z48812 Encounter for surgical aftercare following surgery on the circulatory system: Secondary | ICD-10-CM

## 2012-12-16 DIAGNOSIS — I10 Essential (primary) hypertension: Secondary | ICD-10-CM

## 2012-12-16 NOTE — Progress Notes (Signed)
The patient is back today for followup. He is status post left femoral to above-knee popliteal artery bypass graft with 6 mm Gore-Tex on 09/03/2012. He has an extensive wound on the lateral aspect of his foot and underwent operative debridement on 09/26/2012. He continues to have a wound VAC on this area. There is also a heel ulcer which I am placing Santyl on.  On examination, there has been dramatic improvement in the wound on the lateral side of the foot.  There is a healthy granulation tissue at its base.  The wounds on the dorsum of the foot has completely healed.  The heel ulcer remains but has decreased in size and is more superficial.  There is no evidence of infection.  The patient wants to try dressing changes as the wound VAC is getting somewhat cumbersome.  I have changed him to daily dressing changes with hydrogel on the lateral foot and continuation of Santyl to the heel area.  I gave him 30 oxycodone 5 mg tablets a day.  The patient will follow up in 6 weeks with a duplex of his bypass graft.

## 2012-12-16 NOTE — Patient Instructions (Signed)
Acute bronchitis symptoms are generally not helped by antibiotics.  Take over-the-counter expectorants and cough medications such as  Mucinex DM.  Call if there is no improvement in 5 to 7 days or if he developed worsening cough, fever, or new symptoms, such as shortness of breath or chest pain. 

## 2012-12-16 NOTE — Progress Notes (Signed)
Pre-visit discussion using our clinic review tool. No additional management support is needed unless otherwise documented below in the visit note.  

## 2012-12-16 NOTE — Progress Notes (Signed)
Subjective:    Patient ID: [name], male    DOB: [birth year], 62 y.o.   MRN: [id number]  HPI Pre-visit discussion using our clinic review tool. No additional management support is needed unless otherwise documented below in the visit note.  62 year old patient who is being followed closely for lung cancer as well as peripheral vascular disease. He has a history of left foot on some myelitis and was seen by vascular surgery earlier today. The wound VAC was discontinued and the patient remains on antibiotic therapy. He is also followed by infectious disease. He complains of some cough intermittently productive of clear to green sputum. He has noticed some intermittent low-grade fever. Blood pressure was slightly elevated at the vascular clinic and the patient returned home took lisinopril 20 mg daily which had been discontinued in the past. He states that he has had chronic tachycardia  Past Medical History  Diagnosis Date  . BENIGN PROSTATIC HYPERTROPHY 12/24/2009  . CEREBROVASCULAR ACCIDENT, HX OF 12/24/2009  . HYPERLIPIDEMIA 12/24/2009  . HYPERTENSION 12/24/2009  . Testosterone deficiency   . ED (erectile dysfunction)   . Claudication in peripheral vascular disease, lifestyle limiting, with abnormal arterial dopplers of lower ext. 06/30/2011  . S/P angioplasty with stent to Lt. SFA 06/30/11 06/30/2011  . COPD 12/24/2009  . Shortness of breath     "from the COPD" (10/13/2011)  . Stroke 1996    "right side not as strong as left since; numbness R leg/foot" (10/13/2011)  . Pernicious anemia   . Complication of anesthesia     hard to wake up  . Lung cancer   . Pneumonia     History   Social History  . Marital Status: Married    Spouse Name: N/A    Number of Children: 3  . Years of Education: N/A   Occupational History  . Retired     Airline pilot   Social History Main Topics  . Smoking status: Current Every Day Smoker -- 0.60 packs/day for 45 years    Types: Cigarettes  . Smokeless  tobacco: Never Used  . Alcohol Use: No  . Drug Use: No  . Sexual Activity: Not Currently   Other Topics Concern  . Not on file   Social History Narrative  . No narrative on file    Past Surgical History  Procedure Laterality Date  . Cervical fusion  1980's    "total of 3-4 OR's on my neck; pinched nerve" (10/13/2011)  . Aneurysm coiling  2007  . Peripheral arterial stent graft  06/30/2010; 10/13/10    left; left  . Video bronchoscopy Bilateral 04/03/2012    Procedure: VIDEO BRONCHOSCOPY WITHOUT FLUORO;  Surgeon: Nyoka Cowden, MD;  Location: [facility];  Service: Cardiopulmonary;  Laterality: Bilateral;  . Amputation Left 08/26/2012    Procedure: IRRIGATION AND DEBRIDMENT LEFT DORSAL ABSCESS/ 4TH AND 5TH LEFT RAY AMPUTATION ;  Surgeon: Toni Arthurs, MD;  Location: [facility];  Service: Orthopedics;  Laterality: Left;  . Femoral-popliteal bypass graft Left 09/03/2012    Procedure: BYPASS GRAFT FEMORAL-POPLITEAL ARTERY;  Surgeon: Nada Libman, MD;  Location: [facility] OR;  Service: Vascular;  Laterality: Left;  . Cardiac catheterization    . Colonoscopy      hx: of  . I&d extremity Left 09/26/2012    Procedure: IRRIGATION AND DEBRIDEMENT EXTREMITY- LEFT FOOT WITH WOUND VAC PLACEMENT;  Surgeon: Nada Libman, MD;  Location: [facility] OR;  Service: Vascular;  Laterality: Left;    Family History  Problem Relation Age of Onset  . Emphysema Father     smoked  . Emphysema Paternal Uncle     smoked  . Heart disease Mother   . Lung cancer Father     smoked  . Lung cancer Paternal Grandfather     smoked    No Known Allergies  Current Outpatient Prescriptions on File Prior to Visit  Medication Sig Dispense Refill  . acetaminophen (TYLENOL) 500 MG tablet Take 1,000 mg by mouth every 6 (six) hours as needed for pain.       Marland Kitchen aspirin 81 MG tablet Take 81 mg by mouth daily.      Marland Kitchen atorvastatin (LIPITOR) 20 MG tablet Take 1 tablet (20 mg total) by mouth daily.  90 tablet  1  . budesonide-formoterol  (SYMBICORT) 160-4.5 MCG/ACT inhaler Inhale 2 puffs into the lungs 2 (two) times daily as needed (for shortness of breath).      . doxycycline (VIBRA-TABS) 100 MG tablet Take 1 tablet (100 mg total) by mouth 2 (two) times daily.  60 tablet  3  . feeding supplement (ENSURE COMPLETE) LIQD Take 237 mLs by mouth 2 (two) times daily between meals.      . gabapentin (NEURONTIN) 600 MG tablet Take 1 tablet (600 mg total) by mouth every morning.  90 tablet  1  . nortriptyline (PAMELOR) 75 MG capsule Take 1 capsule (75 mg total) by mouth at bedtime.  90 capsule  1  . oxyCODONE (ROXICODONE) 5 MG immediate release tablet Take 1-2 tablets (5-10 mg total) by mouth every 6 (six) hours as needed for pain.  30 tablet  0  . SANTYL ointment       . tamsulosin (FLOMAX) 0.4 MG CAPS capsule Take 1 capsule (0.4 mg total) by mouth daily.  90 capsule  1  . HYDROcodone-acetaminophen (NORCO/VICODIN) 5-325 MG per tablet Take 1 tablet by mouth every 4 (four) hours as needed for pain (every 4 hours prn).       No current facility-administered medications on file prior to visit.    BP 100/60  Pulse 126  Temp(Src) 98.1 F (36.7 C) (Oral)  Resp 20  Wt 139 lb (63.05 kg)  SpO2 95%       Review of Systems  Constitutional: Positive for fever and fatigue. Negative for chills and appetite change.  HENT: Negative for congestion, dental problem, ear pain, hearing loss, sore throat, tinnitus, trouble swallowing and voice change.   Eyes: Negative for pain, discharge and visual disturbance.  Respiratory: Positive for cough. Negative for chest tightness, wheezing and stridor.   Cardiovascular: Negative for chest pain, palpitations and leg swelling.  Gastrointestinal: Negative for nausea, vomiting, abdominal pain, diarrhea, constipation, blood in stool and abdominal distention.  Genitourinary: Negative for urgency, hematuria, flank pain, discharge, difficulty urinating and genital sores.  Musculoskeletal: Negative for  arthralgias, back pain, gait problem, joint swelling, myalgias and neck stiffness.  Skin: Negative for rash.  Neurological: Negative for dizziness, syncope, speech difficulty, weakness, numbness and headaches.  Hematological: Negative for adenopathy. Does not bruise/bleed easily.  Psychiatric/Behavioral: Negative for behavioral problems and dysphoric mood. The patient is not nervous/anxious.        Objective:   Physical Exam  Constitutional: He is oriented to person, place, and time. He appears well-developed. No distress.  No distress Blood pressure 90/60 Pulse rate 120  HENT:  Head: Normocephalic.  Right Ear: External ear normal.  Left Ear: External ear normal.  Eyes: Conjunctivae and EOM are normal.  Neck: Normal range of motion.  Cardiovascular: Normal heart sounds.   Rate 120  Pulmonary/Chest: Effort normal. No respiratory distress. He has no wheezes.  Decreased breath sounds on the left with a few rhonchi  Abdominal: Bowel sounds are normal.  Musculoskeletal: Normal range of motion. He exhibits no edema and no tenderness.  Left foot bandaged  Neurological: He is alert and oriented to person, place, and time.  Psychiatric: He has a normal mood and affect. His behavior is normal.          Assessment & Plan:   COPD with cough. Patient is presently afebrile and the tachycardia is chronic he is on chronic antibiotics for left foot osteomyelitis. We'll observe on present regimen. If there is any clinical deterioration we'll check a chest x-ray and lab Hypertension,  history of. Patient was told to discontinue lisinopril

## 2012-12-17 NOTE — Addendum Note (Signed)
Addended by: Adria Dill L on: 12/17/2012 10:47 AM   Modules accepted: Orders

## 2012-12-18 ENCOUNTER — Telehealth: Payer: Self-pay | Admitting: *Deleted

## 2012-12-18 NOTE — Telephone Encounter (Signed)
I called patient to inform him that I had found a place that carried the padded boot that Dr Myra Gianotti had ordered.  [facility] has the padded boot for $ 22.95 and patient said that he would go and pick it up tomorrow.  Patient understands the importance of wearing this on his left foot at all times.

## 2012-12-30 ENCOUNTER — Encounter: Payer: Self-pay | Admitting: Infectious Disease

## 2012-12-30 ENCOUNTER — Ambulatory Visit (INDEPENDENT_AMBULATORY_CARE_PROVIDER_SITE_OTHER): Payer: Medicare Other | Admitting: Infectious Disease

## 2012-12-30 VITALS — BP 132/74 | HR 112 | Temp 97.8°F | Wt 130.2 lb

## 2012-12-30 DIAGNOSIS — C3491 Malignant neoplasm of unspecified part of right bronchus or lung: Secondary | ICD-10-CM

## 2012-12-30 DIAGNOSIS — L97509 Non-pressure chronic ulcer of other part of unspecified foot with unspecified severity: Secondary | ICD-10-CM

## 2012-12-30 DIAGNOSIS — C3490 Malignant neoplasm of unspecified part of unspecified bronchus or lung: Secondary | ICD-10-CM

## 2012-12-30 DIAGNOSIS — E11621 Type 2 diabetes mellitus with foot ulcer: Secondary | ICD-10-CM

## 2012-12-30 DIAGNOSIS — E1169 Type 2 diabetes mellitus with other specified complication: Secondary | ICD-10-CM

## 2012-12-30 DIAGNOSIS — M869 Osteomyelitis, unspecified: Secondary | ICD-10-CM

## 2012-12-30 LAB — C-REACTIVE PROTEIN: CRP: 0.9 mg/dL — ABNORMAL HIGH (ref <0.60)

## 2012-12-30 LAB — SEDIMENTATION RATE: Sed Rate: 69 mm/hr — ABNORMAL HIGH (ref 0–16)

## 2012-12-30 MED ORDER — DOXYCYCLINE HYCLATE 100 MG PO TABS: 100.0000 mg | ORAL_TABLET | Freq: Two times a day (BID) | ORAL | Status: DC

## 2012-12-30 NOTE — Progress Notes (Signed)
Subjective:    Patient ID: [name], male    DOB: 1950-06-19, 62 y.o.   MRN: [id number]  HPI   62 y.o. male with Gangrene, abscess, osteomyelitis sp 4th, 5th ray amputaion and I and D of foot abscess. Cultures were no growth (on abx). There were dense major revascularization percutaneously and ultimately the patient underwent vascular surgery on 09/03/12 with:  Left femoral to above-knee popliteal artery bypass with 6 mm propatent PTFE  #2: Harvest, left great saphenous vein  He hadcontinued on IV vancomycin and cefepime and flagyl since then. His wound bed still appears necrotic when I saw him earlier this month withpersistent black necrotic tissue in bed. He was seen by [facility] who performed I and D in hospital and applied wound vacuum. They dc the IV abx and placed him on oral Augmentin 3 times daily which he then ultimately stopped  He had failed to make his followup with me and his home health nurse was concerned by the appearance of the wound at the top of his foot which had having more of a greenish discharge.   He at that time was having more material sucked into his vacuum collection device.  We obtained plain films of the foot and ankle that time which failed to show any evidence of new sites of osteomyelitis.  I placed him on oral doxycycline twice daily and since then he has continued to improve.  No longer using the vacuum dressing but is applying hydrogel to the lateral foot and Santyl to his heel.   He overall feels well without fevers no chills malaise or nausea. Tolerating doxycycline well.    Review of Systems  Constitutional: Negative for fever, chills, diaphoresis, activity change, appetite change, fatigue and unexpected weight change.  HENT: Negative for congestion, rhinorrhea, sinus pressure, sneezing, sore throat and trouble swallowing.   Eyes: Negative for photophobia and visual disturbance.  Respiratory: Negative for cough, chest tightness, shortness of breath,  wheezing and stridor.   Cardiovascular: Negative for chest pain, palpitations and leg swelling.  Gastrointestinal: Negative for nausea, vomiting, abdominal pain, diarrhea, constipation, blood in stool, abdominal distention and anal bleeding.  Genitourinary: Negative for dysuria, hematuria, flank pain and difficulty urinating.  Musculoskeletal: Negative for arthralgias, back pain, gait problem, joint swelling and myalgias.  Skin: Positive for color change and wound. Negative for pallor and rash.  Neurological: Negative for dizziness, tremors, weakness and light-headedness.  Hematological: Negative for adenopathy. Does not bruise/bleed easily.  Psychiatric/Behavioral: Negative for behavioral problems, confusion, sleep disturbance, dysphoric mood, decreased concentration and agitation.       Objective:   Physical Exam  Constitutional: He is oriented to person, place, and time. He appears well-developed and well-nourished. No distress.  HENT:  Head: Normocephalic and atraumatic.  Mouth/Throat: Oropharynx is clear and moist.  Eyes: Conjunctivae and EOM are normal. Pupils are equal, round, and reactive to light. No scleral icterus.  Neck: Normal range of motion. Neck supple.  Cardiovascular: Normal rate, regular rhythm and normal heart sounds.  Exam reveals no gallop and no friction rub.   No murmur heard. Pulmonary/Chest: Effort normal. No respiratory distress. He has wheezes.  Abdominal: He exhibits no distension. There is no tenderness.  Musculoskeletal: He exhibits edema. He exhibits no tenderness.       Feet:  Neurological: He is alert and oriented to person, place, and time.  Skin: Skin is warm and dry. He is not diaphoretic.  Psychiatric: He has a normal mood and affect. Judgment normal.  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 With ulcer on dorsum and heel.  --Will check inflammatory markers today and continue him  on his doxycyline 100mg    --I. would see him back in the next 2 months time to reevaluate him.  I spent greater than 25 minutes with the patient including greater than 50% of time in face to face counsel of the patient and in coordination of their care.   Metastatic non-small cell lung cancer, squamous cell carcinoma: being followed by Dr. Shirline Frees and chemo being held for now

## 2013-01-06 ENCOUNTER — Other Ambulatory Visit: Payer: Self-pay | Admitting: *Deleted

## 2013-01-07 ENCOUNTER — Other Ambulatory Visit: Payer: Self-pay | Admitting: *Deleted

## 2013-01-07 ENCOUNTER — Telehealth: Payer: Self-pay | Admitting: Internal Medicine

## 2013-01-07 MED ORDER — HYDROCODONE-ACETAMINOPHEN 5-325 MG PO TABS: 1.0000 | ORAL_TABLET | Freq: Four times a day (QID) | ORAL | Status: DC | PRN

## 2013-01-07 NOTE — Telephone Encounter (Signed)
Verified with [facility] nurse that patient was having heel pain (osteomyelitis), all other wounds have healed significantly. Patient has an appt with Dr. Myra Gianotti on 01-27-13 and was told this Rx from Dr. Arbie Cookey had to last until then. He voiced understanding of this plan. He is currently on a antibiotic per Dr. Daiva Eves but has not received any pain meds. I called his stated pharmacy [facility] [address] and they confirmed this.

## 2013-01-07 NOTE — Telephone Encounter (Signed)
Patient Information:  Caller Name: Abijah  Phone: [phone]  Patient: [name], [name]  Gender: Male  DOB: 09-19-1950  Age: 62 Years  PCP: Eleonore Chiquito (Family Practice > 13yrs old)  Office Follow Up:  Does the office need to follow up with this patient?: No  Instructions For The Office: N/A  RN Note:  Pt. advised to call the Drs. that did the surgery. Dr. Kirtland Bouchard" will not be able to give a narcotic.  Symptoms  Reason For Call & Symptoms: Pt. states he is calling for pain medication related to a surgery he had several months ago where he had 2 toes amputated. Pt is on Oxycodone 5mg .  Reviewed Health History In EMR: N/A  Reviewed Medications In EMR: N/A  Reviewed Allergies In EMR: N/A  Reviewed Surgeries / Procedures: N/A  Date of Onset of Symptoms: 01/07/2013  Guideline(s) Used:  No Protocol Available - Information Only  Disposition Per Guideline:   Home Care  Reason For Disposition Reached:   Information only question and nurse able to answer  Advice Given:  Call Back If:  New symptoms develop  You become worse.  Patient Will Follow Care Advice:  YES

## 2013-01-24 ENCOUNTER — Encounter: Payer: Self-pay | Admitting: Surgery

## 2013-01-27 ENCOUNTER — Ambulatory Visit (HOSPITAL_COMMUNITY)
Admission: RE | Admit: 2013-01-27 | Discharge: 2013-01-27 | Disposition: A | Discharge status: Home / Self Care | Payer: Medicare Other | Source: Ambulatory Visit | Attending: Surgery | Admitting: Surgery

## 2013-01-27 ENCOUNTER — Ambulatory Visit (INDEPENDENT_AMBULATORY_CARE_PROVIDER_SITE_OTHER): Payer: Medicare Other | Admitting: Surgery

## 2013-01-27 ENCOUNTER — Other Ambulatory Visit: Payer: Self-pay | Admitting: Surgery

## 2013-01-27 ENCOUNTER — Encounter: Payer: Self-pay | Admitting: Surgery

## 2013-01-27 VITALS — BP 160/68 | HR 84 | Ht 67.0 in | Wt 132.2 lb

## 2013-01-27 DIAGNOSIS — I739 Peripheral vascular disease, unspecified: Secondary | ICD-10-CM

## 2013-01-27 DIAGNOSIS — Z48812 Encounter for surgical aftercare following surgery on the circulatory system: Secondary | ICD-10-CM

## 2013-01-27 DIAGNOSIS — L98499 Non-pressure chronic ulcer of skin of other sites with unspecified severity: Principal | ICD-10-CM

## 2013-01-27 NOTE — Addendum Note (Signed)
Addended by: Dorthula Rue L on: 01/27/2013 03:44 PM   Modules accepted: Orders

## 2013-01-27 NOTE — Progress Notes (Signed)
Patient name: [name] MRN: [id number] DOB: 29-May-1950 Sex: male     Chief Complaint  Patient presents with  . Re-evaluation    6 wk f/u with duplex -  L foot wound     HISTORY OF PRESENT ILLNESS: The patient is back today for followup. He is status post left femoral to above-knee popliteal artery bypass graft with 6 mm Gore-Tex on 09/03/2012. He has an extensive wound on the lateral aspect of his foot and underwent operative debridement on 09/26/2012.  Excellent progress with his wound healing.  He now only has a small area on the lateral foot and on the heel.  He is wearing and EHOB boot   Past Medical History  Diagnosis Date  . BENIGN PROSTATIC HYPERTROPHY 12/24/2009  . CEREBROVASCULAR ACCIDENT, HX OF 12/24/2009  . HYPERLIPIDEMIA 12/24/2009  . HYPERTENSION 12/24/2009  . Testosterone deficiency   . ED (erectile dysfunction)   . Claudication in peripheral vascular disease, lifestyle limiting, with abnormal arterial dopplers of lower ext. 06/30/2011  . S/P angioplasty with stent to Lt. SFA 06/30/11 06/30/2011  . COPD 12/24/2009  . Shortness of breath     "from the COPD" (10/13/2011)  . Stroke 1996    "right side not as strong as left since; numbness R leg/foot" (10/13/2011)  . Pernicious anemia   . Complication of anesthesia     hard to wake up  . Lung cancer   . Pneumonia     Past Surgical History  Procedure Laterality Date  . Cervical fusion  1980's    "total of 3-4 OR's on my neck; pinched nerve" (10/13/2011)  . Aneurysm coiling  2007  . Peripheral arterial stent graft  06/30/2010; 10/13/10    left; left  . Video bronchoscopy Bilateral 04/03/2012    Procedure: VIDEO BRONCHOSCOPY WITHOUT FLUORO;  Surgeon: Tanda Rockers, MD;  Location: [facility];  Service: Cardiopulmonary;  Laterality: Bilateral;  . Amputation Left 08/26/2012    Procedure: IRRIGATION AND DEBRIDMENT LEFT DORSAL ABSCESS/ 4TH AND 5TH LEFT RAY AMPUTATION ;  Surgeon: Wylene Simmer, MD;  Location: [facility];   Service: Orthopedics;  Laterality: Left;  . Femoral-popliteal bypass graft Left 09/03/2012    Procedure: BYPASS GRAFT FEMORAL-POPLITEAL ARTERY;  Surgeon: Serafina Mitchell, MD;  Location: [place];  Service: Vascular;  Laterality: Left;  . Cardiac catheterization    . Colonoscopy      hx: of  . I&d extremity Left 09/26/2012    Procedure: IRRIGATION AND DEBRIDEMENT EXTREMITY- LEFT FOOT WITH WOUND VAC PLACEMENT;  Surgeon: Serafina Mitchell, MD;  Location: [place];  Service: Vascular;  Laterality: Left;    History   Social History  . Marital Status: Married    Spouse Name: N/A    Number of Children: 3  . Years of Education: N/A   Occupational History  . Retired     Press photographer   Social History Main Topics  . Smoking status: Current Every Day Smoker -- 0.60 packs/day for 45 years    Types: Cigarettes  . Smokeless tobacco: Never Used  . Alcohol Use: No  . Drug Use: No  . Sexual Activity: Not Currently   Other Topics Concern  . Not on file   Social History Narrative  . No narrative on file    Family History  Problem Relation Age of Onset  . Emphysema Father     smoked  . Emphysema Paternal Uncle     smoked  . Heart disease Mother   .  Lung cancer Father     smoked  . Lung cancer Paternal Grandfather     smoked    Allergies as of 01/27/2013  . (No Known Allergies)    Current Outpatient Prescriptions on File Prior to Visit  Medication Sig Dispense Refill  . acetaminophen (TYLENOL) 500 MG tablet Take 1,000 mg by mouth every 6 (six) hours as needed for pain.       Marland Kitchen aspirin 81 MG tablet Take 81 mg by mouth daily.      Marland Kitchen atorvastatin (LIPITOR) 20 MG tablet Take 1 tablet (20 mg total) by mouth daily.  90 tablet  1  . budesonide-formoterol (SYMBICORT) 160-4.5 MCG/ACT inhaler Inhale 2 puffs into the lungs 2 (two) times daily as needed (for shortness of breath).      . doxycycline (VIBRA-TABS) 100 MG tablet Take 1 tablet (100 mg total) by mouth 2 (two) times daily.  60 tablet  3  .  feeding supplement (ENSURE COMPLETE) LIQD Take 237 mLs by mouth 2 (two) times daily between meals.      . gabapentin (NEURONTIN) 600 MG tablet Take 1 tablet (600 mg total) by mouth every morning.  90 tablet  1  . HYDROcodone-acetaminophen (NORCO/VICODIN) 5-325 MG per tablet Take 1 tablet by mouth every 6 (six) hours as needed for moderate pain.  30 tablet  0  . nortriptyline (PAMELOR) 75 MG capsule Take 1 capsule (75 mg total) by mouth at bedtime.  90 capsule  1  . oxyCODONE (ROXICODONE) 5 MG immediate release tablet Take 1-2 tablets (5-10 mg total) by mouth every 6 (six) hours as needed for pain.  30 tablet  0  . SANTYL ointment       . tamsulosin (FLOMAX) 0.4 MG CAPS capsule Take 1 capsule (0.4 mg total) by mouth daily.  90 capsule  1   No current facility-administered medications on file prior to visit.     REVIEW OF SYSTEMS: Cardiovascular: No chest pain, chest pressure, palpitations, orthopnea, or dyspnea on exertion.  Positive for swelling Pulmonary: Shortness of breath with exertion Neurologic: No weakness, paresthesias, aphasia, or amaurosis. No dizziness. Hematologic: No bleeding problems or clotting disorders. Musculoskeletal: No joint pain or joint swelling. Gastrointestinal: No blood in stool or hematemesis Genitourinary: No dysuria or hematuria. Psychiatric:: No history of major depression. Integumentary: See above Constitutional: No fever or chills.  PHYSICAL EXAMINATION:   Vital signs are BP 160/68  Pulse 84  Ht 5\' 7"  (1.702 m)  Wt 132 lb 3.2 oz (59.966 kg)  BMI 20.70 kg/m2  SpO2 100% General: The patient appears their stated age. HEENT:  No gross abnormalities Pulmonary:  Non labored breathing Abdomen: Soft and non-tender Musculoskeletal: There are no major deformities. Neurologic: No focal weakness or paresthesias are detected, Skin: Improvement in the left heel ulcer.  This is now very superficial with minimal drainage.  There is only a small 1 x 0.5 cm open  area on the lateral side of the left foot. Psychiatric: The patient has normal affect. Cardiovascular: There is a regular rate and rhythm without significant murmur appreciated.   Diagnostic Studies Duplex ultrasound was ordered and reviewed today.  His bypass graft remained widely patent.  There were no areas of elevated velocity  Assessment: Status post left leg revascularization and amputation of the fourth and fifth toes Plan: The patient has made excellent progress since I last saw him.  He is placing hydrogel on the lateral side of his foot for a small wound, and Santyl on  the heel.  These wounds have nearly completely closed.  I have stressed the importance of pressure offloading.  He is scheduled to followup in 3 months with a repeat ultrasound of his bypass graft  V. Leia Alf, M.D. [facility] Office: [phone] Pager:  [phone]

## 2013-02-06 ENCOUNTER — Other Ambulatory Visit: Payer: Self-pay | Admitting: *Deleted

## 2013-02-06 DIAGNOSIS — L98499 Non-pressure chronic ulcer of skin of other sites with unspecified severity: Principal | ICD-10-CM

## 2013-02-06 DIAGNOSIS — I739 Peripheral vascular disease, unspecified: Secondary | ICD-10-CM

## 2013-02-06 MED ORDER — COLLAGENASE 250 UNIT/GM EX OINT: TOPICAL_OINTMENT | CUTANEOUS | Status: DC

## 2013-03-03 ENCOUNTER — Ambulatory Visit (INDEPENDENT_AMBULATORY_CARE_PROVIDER_SITE_OTHER): Payer: Medicare Other | Admitting: Infectious Disease

## 2013-03-03 ENCOUNTER — Encounter: Payer: Self-pay | Admitting: Infectious Disease

## 2013-03-03 VITALS — BP 177/78 | HR 89 | Temp 97.9°F | Wt 135.0 lb

## 2013-03-03 DIAGNOSIS — L98499 Non-pressure chronic ulcer of skin of other sites with unspecified severity: Secondary | ICD-10-CM

## 2013-03-03 DIAGNOSIS — C3490 Malignant neoplasm of unspecified part of unspecified bronchus or lung: Secondary | ICD-10-CM

## 2013-03-03 DIAGNOSIS — M869 Osteomyelitis, unspecified: Secondary | ICD-10-CM

## 2013-03-03 DIAGNOSIS — B351 Tinea unguium: Secondary | ICD-10-CM

## 2013-03-03 DIAGNOSIS — I739 Peripheral vascular disease, unspecified: Secondary | ICD-10-CM

## 2013-03-03 LAB — COMPLETE METABOLIC PANEL WITH GFR
AST: 11 U/L (ref 0–37)
Albumin: 4.1 g/dL (ref 3.5–5.2)
Alkaline Phosphatase: 108 U/L (ref 39–117)
BILIRUBIN TOTAL: 0.4 mg/dL (ref 0.2–1.2)
BUN: 13 mg/dL (ref 6–23)
CO2: 29 meq/L (ref 19–32)
Calcium: 9.4 mg/dL (ref 8.4–10.5)
Chloride: 98 mEq/L (ref 96–112)
Creat: 0.65 mg/dL (ref 0.50–1.35)
GFR, Est African American: >89 mL/min
GFR, Est Non African American: >89 mL/min
Glucose, Bld: 91 mg/dL (ref 70–99)
Potassium: 3.8 mEq/L (ref 3.5–5.3)
SODIUM: 135 meq/L (ref 135–145)
TOTAL PROTEIN: 7.3 g/dL (ref 6.0–8.3)

## 2013-03-03 LAB — C-REACTIVE PROTEIN

## 2013-03-03 LAB — SEDIMENTATION RATE: SED RATE: 13 mm/h (ref 0–16)

## 2013-03-03 MED ORDER — TERBINAFINE HCL 250 MG PO TABS: 250.0000 mg | ORAL_TABLET | Freq: Every day | ORAL | Status: DC

## 2013-03-03 NOTE — Progress Notes (Signed)
Subjective:    Patient ID: [name], male    DOB: 05-Jul-1950, 63 y.o.   MRN: [id number]  HPI   63 y.o. male with Gangrene, abscess, osteomyelitis sp 4th, 5th ray amputaion and I and D of foot abscess. Cultures were no growth (on abx). There were dense major revascularization percutaneously and ultimately the patient underwent vascular surgery on 09/03/12 with:  Left femoral to above-knee popliteal artery bypass with 6 mm propatent PTFE  #2: [place], left great saphenous vein  He hadcontinued on IV vancomycin and cefepime and flagyl since then. His wound bed still appears necrotic when I saw him earlier this month withpersistent black necrotic tissue in bed. He was seen by [facility] who performed I and D in hospital and applied wound vacuum. They dc the IV abx and placed him on oral Augmentin 3 times daily which he then ultimately stopped  He had failed to make his followup with me and his home health nurse was concerned by the appearance of the wound at the top of his foot which had having more of a greenish discharge.   He at that time was having more material sucked into his vacuum collection device.  We obtained plain films of the foot and ankle that time which failed to show any evidence of new sites of osteomyelitis.  I placed him on oral doxycycline twice daily and since then he has continued to improve.  He has continued to be followed by [facility].     Review of Systems  Constitutional: Negative for fever, chills, diaphoresis, activity change, appetite change, fatigue and unexpected weight change.  HENT: Negative for congestion, rhinorrhea, sinus pressure, sneezing, sore throat and trouble swallowing.   Eyes: Negative for photophobia and visual disturbance.  Respiratory: Negative for cough, chest tightness, shortness of breath, wheezing and stridor.   Cardiovascular: Negative for chest pain, palpitations and leg swelling.  Gastrointestinal: Negative for nausea, vomiting, abdominal pain,  diarrhea, constipation, blood in stool, abdominal distention and anal bleeding.  Genitourinary: Negative for dysuria, hematuria, flank pain and difficulty urinating.  Musculoskeletal: Negative for arthralgias, back pain, gait problem, joint swelling and myalgias.  Skin: Positive for color change and wound. Negative for pallor and rash.  Neurological: Negative for dizziness, tremors, weakness and light-headedness.  Hematological: Negative for adenopathy. Does not bruise/bleed easily.  Psychiatric/Behavioral: Negative for behavioral problems, confusion, sleep disturbance, dysphoric mood, decreased concentration and agitation.       Objective:   Physical Exam  Constitutional: He is oriented to person, place, and time. He appears well-developed and well-nourished. No distress.  HENT:  Head: Normocephalic and atraumatic.  Mouth/Throat: Oropharynx is clear and moist.  Eyes: Conjunctivae and EOM are normal. Pupils are equal, round, and reactive to light. No scleral icterus.  Neck: Normal range of motion. Neck supple.  Cardiovascular: Normal rate, regular rhythm and normal heart sounds.  Exam reveals no gallop and no friction rub.   No murmur heard. Pulmonary/Chest: Effort normal. No respiratory distress.  Abdominal: He exhibits no distension. There is no tenderness.  Musculoskeletal: He exhibits edema. He exhibits no tenderness.       Feet:  Neurological: He is alert and oriented to person, place, and time.  Skin: Skin is warm and dry. He is not diaphoretic.  Psychiatric: He has a normal mood and affect. Judgment normal.   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 With ulcer on dorsum and heel.  --Will check inflammatory markers today and  continue him on his doxycyline 100mg  and see him back in 2 months. I am happy with progress   I spent greater than 25 minutes with the patient including greater than 50% of time in face to face counsel of the patient and in coordination of their care.  ONychomycosis: give lamisil for 6 months systemically  Metastatic non-small cell lung cancer, squamous cell carcinoma: being followed by Dr. Earlie Server and chemo being held for now

## 2013-03-18 ENCOUNTER — Ambulatory Visit: Payer: Medicare Other | Admitting: Internal Medicine

## 2013-04-01 ENCOUNTER — Telehealth: Payer: Self-pay | Admitting: Internal Medicine

## 2013-04-01 ENCOUNTER — Ambulatory Visit (INDEPENDENT_AMBULATORY_CARE_PROVIDER_SITE_OTHER): Payer: Medicare Other | Admitting: Internal Medicine

## 2013-04-01 ENCOUNTER — Encounter: Payer: Self-pay | Admitting: Internal Medicine

## 2013-04-01 VITALS — BP 130/90 | HR 106 | Temp 97.7°F | Resp 20 | Ht 67.0 in | Wt 133.0 lb

## 2013-04-01 DIAGNOSIS — C3490 Malignant neoplasm of unspecified part of unspecified bronchus or lung: Secondary | ICD-10-CM

## 2013-04-01 DIAGNOSIS — J449 Chronic obstructive pulmonary disease, unspecified: Secondary | ICD-10-CM

## 2013-04-01 DIAGNOSIS — L98499 Non-pressure chronic ulcer of skin of other sites with unspecified severity: Secondary | ICD-10-CM

## 2013-04-01 DIAGNOSIS — Z23 Encounter for immunization: Secondary | ICD-10-CM

## 2013-04-01 DIAGNOSIS — M869 Osteomyelitis, unspecified: Secondary | ICD-10-CM

## 2013-04-01 DIAGNOSIS — I10 Essential (primary) hypertension: Secondary | ICD-10-CM

## 2013-04-01 DIAGNOSIS — I739 Peripheral vascular disease, unspecified: Secondary | ICD-10-CM

## 2013-04-01 DIAGNOSIS — R300 Dysuria: Secondary | ICD-10-CM

## 2013-04-01 DIAGNOSIS — J4489 Other specified chronic obstructive pulmonary disease: Secondary | ICD-10-CM

## 2013-04-01 LAB — POCT URINALYSIS DIPSTICK
Bilirubin, UA: NEGATIVE
GLUCOSE UA: NEGATIVE
KETONES UA: NEGATIVE
Leukocytes, UA: NEGATIVE
Nitrite, UA: NEGATIVE
Protein, UA: NEGATIVE
RBC UA: NEGATIVE
SPEC GRAV UA: 1.02
UROBILINOGEN UA: 0.2
pH, UA: 6

## 2013-04-01 MED ORDER — TAMSULOSIN HCL 0.4 MG PO CAPS: 0.4000 mg | ORAL_CAPSULE | Freq: Every day | ORAL | Status: DC

## 2013-04-01 MED ORDER — GABAPENTIN 600 MG PO TABS: 600.0000 mg | ORAL_TABLET | ORAL | Status: DC

## 2013-04-01 NOTE — Progress Notes (Signed)
Pre-visit discussion using our clinic review tool. No additional management support is needed unless otherwise documented below in the visit note.  

## 2013-04-01 NOTE — Patient Instructions (Signed)
Limit your sodium (Salt) intake  Return in 4 months for follow-up

## 2013-04-01 NOTE — Telephone Encounter (Signed)
Relevant patient education assigned to patient using Emmi. ° °

## 2013-04-01 NOTE — Progress Notes (Signed)
Subjective:    Patient ID: [name], male    DOB: 08-04-1950, 63 y.o.   MRN: [id number]  HPI  63 year old patient who is seen today for followup.  He has a history of PAD and prior osteomyelitis of the left foot.  He continues to be followed closely by vascular surgery and I&D.  He is followed by oncology for lung cancer.  We'll recently he has done quite well clinically with the weight gain and in general, feels quite well.  Last visit lisinopril was placed on hold due to hypotension.  Blood pressure readings have generally been normal.  He had some mild intermittent dysuria.  Otherwise, no focal complaints today.  In general, feels well  Past Medical History  Diagnosis Date  . BENIGN PROSTATIC HYPERTROPHY 12/24/2009  . CEREBROVASCULAR ACCIDENT, HX OF 12/24/2009  . HYPERLIPIDEMIA 12/24/2009  . HYPERTENSION 12/24/2009  . Testosterone deficiency   . ED (erectile dysfunction)   . Claudication in peripheral vascular disease, lifestyle limiting, with abnormal arterial dopplers of lower ext. 06/30/2011  . S/P angioplasty with stent to Lt. SFA 06/30/11 06/30/2011  . COPD 12/24/2009  . Shortness of breath     "from the COPD" (10/13/2011)  . Stroke 1996    "right side not as strong as left since; numbness R leg/foot" (10/13/2011)  . Pernicious anemia   . Complication of anesthesia     hard to wake up  . Lung cancer   . Pneumonia     History   Social History  . Marital Status: Married    Spouse Name: N/A    Number of Children: 3  . Years of Education: N/A   Occupational History  . Retired     Press photographer   Social History Main Topics  . Smoking status: Current Every Day Smoker -- 0.60 packs/day for 45 years    Types: Cigarettes  . Smokeless tobacco: Never Used  . Alcohol Use: No  . Drug Use: No  . Sexual Activity: Not Currently   Other Topics Concern  . Not on file   Social History Narrative  . No narrative on file    Past Surgical History  Procedure Laterality Date  .  Cervical fusion  1980's    "total of 3-4 OR's on my neck; pinched nerve" (10/13/2011)  . Aneurysm coiling  2007  . Peripheral arterial stent graft  06/30/2010; 10/13/10    left; left  . Video bronchoscopy Bilateral 04/03/2012    Procedure: VIDEO BRONCHOSCOPY WITHOUT FLUORO;  Surgeon: Tanda Rockers, MD;  Location: [facility];  Service: Cardiopulmonary;  Laterality: Bilateral;  . Amputation Left 08/26/2012    Procedure: IRRIGATION AND DEBRIDMENT LEFT DORSAL ABSCESS/ 4TH AND 5TH LEFT RAY AMPUTATION ;  Surgeon: Wylene Simmer, MD;  Location: [facility];  Service: Orthopedics;  Laterality: Left;  . Femoral-popliteal bypass graft Left 09/03/2012    Procedure: BYPASS GRAFT FEMORAL-POPLITEAL ARTERY;  Surgeon: Serafina Mitchell, MD;  Location: [place] [facility];  Service: Vascular;  Laterality: Left;  . Cardiac catheterization    . Colonoscopy      hx: of  . I&d extremity Left 09/26/2012    Procedure: IRRIGATION AND DEBRIDEMENT EXTREMITY- LEFT FOOT WITH WOUND VAC PLACEMENT;  Surgeon: Serafina Mitchell, MD;  Location: [facility];  Service: Vascular;  Laterality: Left;    Family History  Problem Relation Age of Onset  . Emphysema Father     smoked  . Emphysema Paternal Uncle     smoked  . Heart disease  Mother   . Lung cancer Father     smoked  . Lung cancer Paternal Grandfather     smoked    No Known Allergies  Current Outpatient Prescriptions on File Prior to Visit  Medication Sig Dispense Refill  . acetaminophen (TYLENOL) 500 MG tablet Take 1,000 mg by mouth every 6 (six) hours as needed for pain.       Marland Kitchen aspirin 81 MG tablet Take 81 mg by mouth daily.      Marland Kitchen atorvastatin (LIPITOR) 20 MG tablet Take 1 tablet (20 mg total) by mouth daily.  90 tablet  1  . budesonide-formoterol (SYMBICORT) 160-4.5 MCG/ACT inhaler Inhale 2 puffs into the lungs 2 (two) times daily as needed (for shortness of breath).      . collagenase (SANTYL) ointment Apply topically as directed.  15 g  1  . doxycycline (VIBRA-TABS) 100 MG tablet Take  1 tablet (100 mg total) by mouth 2 (two) times daily.  60 tablet  3  . feeding supplement (ENSURE COMPLETE) LIQD Take 237 mLs by mouth 2 (two) times daily between meals.      Marland Kitchen HYDROcodone-acetaminophen (NORCO/VICODIN) 5-325 MG per tablet Take 1 tablet by mouth every 6 (six) hours as needed for moderate pain.  30 tablet  0  . nortriptyline (PAMELOR) 75 MG capsule Take 1 capsule (75 mg total) by mouth at bedtime.  90 capsule  1  . oxyCODONE (ROXICODONE) 5 MG immediate release tablet Take 1-2 tablets (5-10 mg total) by mouth every 6 (six) hours as needed for pain.  30 tablet  0  . terbinafine (LAMISIL) 250 MG tablet Take 1 tablet (250 mg total) by mouth daily.  30 tablet  5   No current facility-administered medications on file prior to visit.    BP 130/90  Pulse 106  Temp(Src) 97.7 F (36.5 C) (Oral)  Resp 20  Ht 5\' 7"  (1.702 m)  Wt 133 lb (60.328 kg)  BMI 20.83 kg/m2  SpO2 97%       Review of Systems  Constitutional: Negative for fever, chills, appetite change and fatigue.  HENT: Negative for congestion, dental problem, ear pain, hearing loss, sore throat, tinnitus, trouble swallowing and voice change.   Eyes: Negative for pain, discharge and visual disturbance.  Respiratory: Positive for shortness of breath. Negative for cough, chest tightness, wheezing and stridor.   Cardiovascular: Positive for leg swelling. Negative for chest pain and palpitations.  Gastrointestinal: Negative for nausea, vomiting, abdominal pain, diarrhea, constipation, blood in stool and abdominal distention.  Genitourinary: Negative for urgency, hematuria, flank pain, discharge, difficulty urinating and genital sores.  Musculoskeletal: Negative for arthralgias, back pain, gait problem, joint swelling, myalgias and neck stiffness.  Skin: Negative for rash.  Neurological: Negative for dizziness, syncope, speech difficulty, weakness, numbness and headaches.  Hematological: Negative for adenopathy. Does not  bruise/bleed easily.  Psychiatric/Behavioral: Negative for behavioral problems and dysphoric mood. The patient is not nervous/anxious.        Objective:   Physical Exam  Constitutional: He is oriented to person, place, and time. He appears well-developed.  Blood pressure 134/78  HENT:  Head: Normocephalic.  Right Ear: External ear normal.  Left Ear: External ear normal.  Eyes: Conjunctivae and EOM are normal.  Neck: Normal range of motion.  Cardiovascular: Normal rate and normal heart sounds.   Pulmonary/Chest: Effort normal. No respiratory distress. He has no wheezes. He has no rales.  Diminished breath sounds  Abdominal: Bowel sounds are normal.  Musculoskeletal: Normal range  of motion. He exhibits edema. He exhibits no tenderness.  Chronic edema, left ankle  Neurological: He is alert and oriented to person, place, and time.  Psychiatric: He has a normal mood and affect. His behavior is normal.          Assessment & Plan:   PAD History osteomyelitis, left foot Lung cancer Hypertension well controlled.  Repeat blood pressure 134/78

## 2013-04-02 ENCOUNTER — Other Ambulatory Visit: Payer: Self-pay | Admitting: *Deleted

## 2013-04-02 ENCOUNTER — Telehealth: Payer: Self-pay | Admitting: Internal Medicine

## 2013-04-02 DIAGNOSIS — C3490 Malignant neoplasm of unspecified part of unspecified bronchus or lung: Secondary | ICD-10-CM

## 2013-04-02 NOTE — Progress Notes (Signed)
Pt called wanting to know when his CT and f/u with [facility] would be.  No current appts placed.  Per Dr Vista Mink office note, pt needed lab/ CT and f/u needed in 3 months.  Pt last seen November 2014.  Onc tx schedule created.  SLJ

## 2013-04-02 NOTE — Telephone Encounter (Signed)
Relevant patient education assigned to patient using Emmi. ° °

## 2013-04-03 ENCOUNTER — Telehealth: Payer: Self-pay | Admitting: Internal Medicine

## 2013-04-03 ENCOUNTER — Other Ambulatory Visit: Payer: Self-pay | Admitting: Medical Oncology

## 2013-04-03 DIAGNOSIS — C3490 Malignant neoplasm of unspecified part of unspecified bronchus or lung: Secondary | ICD-10-CM

## 2013-04-03 NOTE — Telephone Encounter (Signed)
, °

## 2013-04-04 ENCOUNTER — Telehealth: Payer: Self-pay | Admitting: Internal Medicine

## 2013-04-04 NOTE — Telephone Encounter (Signed)
Relevant patient education assigned to patient using Emmi. ° °

## 2013-04-14 ENCOUNTER — Other Ambulatory Visit (HOSPITAL_BASED_OUTPATIENT_CLINIC_OR_DEPARTMENT_OTHER): Payer: Medicare Other

## 2013-04-14 ENCOUNTER — Ambulatory Visit (HOSPITAL_COMMUNITY)
Admission: RE | Admit: 2013-04-14 | Discharge: 2013-04-14 | Disposition: A | Discharge status: Home / Self Care | Payer: Medicare Other | Source: Ambulatory Visit | Attending: Internal Medicine | Admitting: Internal Medicine

## 2013-04-14 ENCOUNTER — Encounter (HOSPITAL_COMMUNITY): Payer: Self-pay

## 2013-04-14 DIAGNOSIS — C778 Secondary and unspecified malignant neoplasm of lymph nodes of multiple regions: Secondary | ICD-10-CM

## 2013-04-14 DIAGNOSIS — C3490 Malignant neoplasm of unspecified part of unspecified bronchus or lung: Secondary | ICD-10-CM | POA: Insufficient documentation

## 2013-04-14 DIAGNOSIS — C3410 Malignant neoplasm of upper lobe, unspecified bronchus or lung: Secondary | ICD-10-CM

## 2013-04-14 DIAGNOSIS — R918 Other nonspecific abnormal finding of lung field: Secondary | ICD-10-CM | POA: Insufficient documentation

## 2013-04-14 LAB — COMPREHENSIVE METABOLIC PANEL (CC13)
ALK PHOS: 130 U/L (ref 40–150)
ALT: 8 U/L (ref 0–55)
AST: 14 U/L (ref 5–34)
Albumin: 3.8 g/dL (ref 3.5–5.0)
Anion Gap: 9 mEq/L (ref 3–11)
BILIRUBIN TOTAL: 0.26 mg/dL (ref 0.20–1.20)
BUN: 11.2 mg/dL (ref 7.0–26.0)
CO2: 25 mEq/L (ref 22–29)
CREATININE: 0.8 mg/dL (ref 0.7–1.3)
Calcium: 9.7 mg/dL (ref 8.4–10.4)
Chloride: 101 mEq/L (ref 98–109)
Glucose: 93 mg/dl (ref 70–140)
Potassium: 4.1 mEq/L (ref 3.5–5.1)
Sodium: 135 mEq/L — ABNORMAL LOW (ref 136–145)
TOTAL PROTEIN: 7.8 g/dL (ref 6.4–8.3)

## 2013-04-14 LAB — CBC WITH DIFFERENTIAL/PLATELET
BASO%: 1.1 % (ref 0.0–2.0)
BASOS ABS: 0.1 10*3/uL (ref 0.0–0.1)
EOS%: 4.8 % (ref 0.0–7.0)
Eosinophils Absolute: 0.3 10*3/uL (ref 0.0–0.5)
HEMATOCRIT: 38.1 % — AB (ref 38.4–49.9)
HEMOGLOBIN: 12.5 g/dL — AB (ref 13.0–17.1)
LYMPH%: 23.5 % (ref 14.0–49.0)
MCH: 31.1 pg (ref 27.2–33.4)
MCHC: 32.7 g/dL (ref 32.0–36.0)
MCV: 95.1 fL (ref 79.3–98.0)
MONO#: 0.5 10*3/uL (ref 0.1–0.9)
MONO%: 7.6 % (ref 0.0–14.0)
NEUT%: 63 % (ref 39.0–75.0)
NEUTROS ABS: 4.5 10*3/uL (ref 1.5–6.5)
PLATELETS: 328 10*3/uL (ref 140–400)
RBC: 4.01 10*6/uL — AB (ref 4.20–5.82)
RDW: 15.8 % — ABNORMAL HIGH (ref 11.0–14.6)
WBC: 7.1 10*3/uL (ref 4.0–10.3)
lymph#: 1.7 10*3/uL (ref 0.9–3.3)

## 2013-04-14 MED ORDER — IOHEXOL 300 MG/ML  SOLN
100.0000 mL | Freq: Once | INTRAMUSCULAR | Status: AC | PRN
  Administered 2013-04-14: 100 mL via INTRAVENOUS

## 2013-04-16 ENCOUNTER — Ambulatory Visit (HOSPITAL_BASED_OUTPATIENT_CLINIC_OR_DEPARTMENT_OTHER): Payer: Medicare Other | Admitting: Internal Medicine

## 2013-04-16 ENCOUNTER — Encounter: Payer: Self-pay | Admitting: Internal Medicine

## 2013-04-16 ENCOUNTER — Telehealth: Payer: Self-pay | Admitting: Internal Medicine

## 2013-04-16 VITALS — BP 154/55 | HR 94 | Temp 97.7°F | Resp 20 | Ht 67.0 in | Wt 134.6 lb

## 2013-04-16 DIAGNOSIS — C778 Secondary and unspecified malignant neoplasm of lymph nodes of multiple regions: Secondary | ICD-10-CM

## 2013-04-16 DIAGNOSIS — C3492 Malignant neoplasm of unspecified part of left bronchus or lung: Secondary | ICD-10-CM

## 2013-04-16 DIAGNOSIS — F17200 Nicotine dependence, unspecified, uncomplicated: Secondary | ICD-10-CM

## 2013-04-16 DIAGNOSIS — C3410 Malignant neoplasm of upper lobe, unspecified bronchus or lung: Secondary | ICD-10-CM

## 2013-04-16 DIAGNOSIS — Z8673 Personal history of transient ischemic attack (TIA), and cerebral infarction without residual deficits: Secondary | ICD-10-CM

## 2013-04-16 NOTE — Telephone Encounter (Signed)
per [facility] sch lab 1 wk prior to f/u in 34mths-sch and printed calander for pt/adv [facility] will call to sch CT

## 2013-04-16 NOTE — Progress Notes (Signed)
[facility] [phone]   Fax:([phone]) [phone]  OFFICE PROGRESS NOTE  Frank Cowden, MD [place] [address]  DIAGNOSIS: Metastatic non-small cell lung cancer, squamous cell carcinoma diagnosed in March 2014.   PRIOR THERAPY:None.   CURRENT THERAPY: Systemic chemotherapy with carboplatin for an AUC of 5 given on day 1 and Abraxane at 100 mg per meter squared given on days 1, 8 and 15 every 3 weeks. Status post 4 cycles, as well as day 1 of cycle 5.   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  CURRENT SMOKING STATUS: Current smoker and was strongly advised to quit smoking and offered smoke cessation materials.  ORAL CHEMOTHERAPY AND CONSENT: None  CURRENT BISPHOSPHONATES USE: None  LIVING WILL AND CODE STATUS: Full code   INTERVAL HISTORY:  [name] 63 y.o. male returns to the clinic today for followup visit accompanied by his wife. The patient is feeling fine today with no specific complaints. The patient denied having any significant weight loss or night sweats. He denied having any chest pain, shortness of breath, cough or hemoptysis. He denied having any significant nausea or vomiting, no fever or chills. The patient came today for evaluation after repeating CT scan of the chest, abdomen and pelvis.  INTERVAL HISTORY: [name] 63 y.o. male returns to the clinic today for   MEDICAL HISTORY: Past Medical History  Diagnosis Date  . BENIGN PROSTATIC HYPERTROPHY 12/24/2009  . CEREBROVASCULAR ACCIDENT, HX OF 12/24/2009  . HYPERLIPIDEMIA 12/24/2009  . HYPERTENSION 12/24/2009  . Testosterone deficiency   . ED (erectile dysfunction)   . Claudication in peripheral vascular disease,  lifestyle limiting, with abnormal arterial dopplers of lower ext. 06/30/2011  . S/P angioplasty with stent to Lt. SFA 06/30/11 06/30/2011  . COPD 12/24/2009  . Shortness of breath     "from the COPD" (10/13/2011)  . Stroke 1996    "right side not as strong as left since; numbness R leg/foot" (10/13/2011)  . Pernicious anemia   . Complication of anesthesia     hard to wake up  . Lung cancer   . Pneumonia     ALLERGIES:  has No Known Allergies.  MEDICATIONS:  Current Outpatient Prescriptions  Medication Sig Dispense Refill  . acetaminophen (TYLENOL) 500 MG tablet Take 1,000 mg by mouth every 6 (six) hours as needed for pain.       Marland Kitchen aspirin 81 MG tablet Take 81 mg by mouth daily.      Marland Kitchen atorvastatin (LIPITOR) 20 MG tablet Take 1 tablet (20 mg total) by mouth daily.  90 tablet  1  . budesonide-formoterol (SYMBICORT) 160-4.5 MCG/ACT inhaler Inhale 2 puffs into the lungs 2 (two) times daily as needed (for shortness of breath).      . collagenase (SANTYL) ointment Apply topically as directed.  15 g  1  . doxycycline (VIBRA-TABS) 100 MG tablet Take 1 tablet (100 mg total) by mouth 2 (two) times daily.  60 tablet  3  . gabapentin (NEURONTIN) 600 MG tablet Take 1 tablet (600 mg total) by mouth every morning.  90 tablet  3  . nortriptyline (PAMELOR) 75 MG capsule Take 1 capsule (75 mg total) by mouth at bedtime.  90 capsule  1  . oxyCODONE (ROXICODONE) 5 MG immediate release tablet Take 1-2 tablets (5-10 mg total) by mouth every 6 (six) hours as needed for pain.  30 tablet  0  . tamsulosin (FLOMAX) 0.4 MG CAPS capsule Take 1 capsule (0.4 mg total) by mouth daily.  90 capsule  3  . terbinafine (LAMISIL) 250 MG tablet Take 1 tablet (250 mg total) by mouth daily.  30 tablet  5  . feeding supplement (ENSURE COMPLETE) LIQD Take 237 mLs by mouth 2 (two) times daily between meals.      Marland Kitchen HYDROcodone-acetaminophen (NORCO/VICODIN) 5-325 MG per tablet Take 1 tablet by mouth every 6 (six) hours as needed for  moderate pain.  30 tablet  0   No current facility-administered medications for this visit.    SURGICAL HISTORY:  Past Surgical History  Procedure Laterality Date  . Cervical fusion  1980's    "total of 3-4 OR's on my neck; pinched nerve" (10/13/2011)  . Aneurysm coiling  2007  . Peripheral arterial stent graft  06/30/2010; 10/13/10    left; left  . Video bronchoscopy Bilateral 04/03/2012    Procedure: VIDEO BRONCHOSCOPY WITHOUT FLUORO;  Surgeon: Tanda Rockers, MD;  Location: [facility];  Service: Cardiopulmonary;  Laterality: Bilateral;  . Amputation Left 08/26/2012    Procedure: IRRIGATION AND DEBRIDMENT LEFT DORSAL ABSCESS/ 4TH AND 5TH LEFT RAY AMPUTATION ;  Surgeon: Wylene Simmer, MD;  Location: [facility];  Service: Orthopedics;  Laterality: Left;  . Femoral-popliteal bypass graft Left 09/03/2012    Procedure: BYPASS GRAFT FEMORAL-POPLITEAL ARTERY;  Surgeon: Serafina Mitchell, MD;  Location: [facility];  Service: Vascular;  Laterality: Left;  . Cardiac catheterization    . Colonoscopy      hx: of  . I&d extremity Left 09/26/2012    Procedure: IRRIGATION AND DEBRIDEMENT EXTREMITY- LEFT FOOT WITH WOUND VAC PLACEMENT;  Surgeon: Serafina Mitchell, MD;  Location: [facility];  Service: Vascular;  Laterality: Left;    REVIEW OF SYSTEMS:  Constitutional: positive for fatigue Eyes: negative Ears, nose, mouth, throat, and face: negative Respiratory: positive for dyspnea on exertion Cardiovascular: negative Gastrointestinal: negative Genitourinary:negative Integument/breast: negative Hematologic/lymphatic: negative Musculoskeletal:negative Neurological: negative Behavioral/Psych: negative Endocrine: negative Allergic/Immunologic: negative   PHYSICAL EXAMINATION: General appearance: alert, cooperative, appears stated age, fatigued and no distress Head: Normocephalic, without obvious abnormality, atraumatic Neck: no adenopathy, no JVD, supple, symmetrical, trachea midline and thyroid not enlarged,  symmetric, no tenderness/mass/nodules Lymph nodes: Cervical, supraclavicular, and axillary nodes normal. Resp: clear to auscultation bilaterally Back: symmetric, no curvature. ROM normal. No CVA tenderness. Cardio: regular rate and rhythm, S1, S2 normal, no murmur, click, rub or gallop GI: soft, non-tender; bowel sounds normal; no masses,  no organomegaly Extremities: edema 1+ left foot Neurologic: Alert and oriented X 3, normal strength and tone. Normal symmetric reflexes. Normal coordination and gait  ECOG PERFORMANCE STATUS: 1 - Symptomatic but completely ambulatory  Blood pressure 154/55, pulse 94, temperature 97.7 F (36.5 C), temperature source Oral, resp. rate 20, height 5\' 7"  (1.702 m), weight 134 lb 9.6 oz (61.054 kg), SpO2 97.00%.  LABORATORY DATA: Lab Results  Component Value Date   WBC 7.1 04/14/2013   HGB 12.5* 04/14/2013   HCT 38.1* 04/14/2013   MCV 95.1 04/14/2013   PLT 328 04/14/2013      Chemistry      Component Value Date/Time   NA 135* 04/14/2013 1135   NA 135 03/03/2013 0908   K 4.1 04/14/2013 1135   K 3.8 03/03/2013 0908  CL 98 03/03/2013 0908   CL 97* 06/13/2012 1300   CO2 25 04/14/2013 1135   CO2 29 03/03/2013 0908   BUN 11.2 04/14/2013 1135   BUN 13 03/03/2013 0908   CREATININE 0.8 04/14/2013 1135   CREATININE 0.65 03/03/2013 0908   CREATININE 1.1 12/05/2012 1348      Component Value Date/Time   CALCIUM 9.7 04/14/2013 1135   CALCIUM 9.4 03/03/2013 0908   ALKPHOS 130 04/14/2013 1135   ALKPHOS 108 03/03/2013 0908   AST 14 04/14/2013 1135   AST 11 03/03/2013 0908   ALT 8 04/14/2013 1135   ALT <8 03/03/2013 0908   BILITOT 0.26 04/14/2013 1135   BILITOT 0.4 03/03/2013 0908       RADIOGRAPHIC STUDIES: Ct Chest W Contrast  04/14/2013   CLINICAL DATA:  Restaging for metastatic lung cancer.  EXAM: CT CHEST, ABDOMEN, AND PELVIS WITH CONTRAST  TECHNIQUE: Multidetector CT imaging of the chest, abdomen and pelvis was performed following the standard protocol during bolus  administration of intravenous contrast.  CONTRAST:  132mL OMNIPAQUE IOHEXOL 300 MG/ML  SOLN  COMPARISON:  CT CHEST W/CM dated 11/14/2012; CT CHEST W/CM dated 07/08/2012; NM PET IMAGE INITIAL (PI) SKULL BASE TO THIGH dated 04/10/2012  FINDINGS:   CT CHEST FINDINGS  Visualized thyroid is unremarkable. Unchanged 1 cm left axillary lymph node (image 14; series 2). Unchanged 0.7 cm precarinal lymph node (image 27; series 2). Normal heart size. No pericardial effusion. Dense coronary arterial calcifications. Normal caliber aorta and main pulmonary artery. No significant interval change in size of mediastinal mass near the AP window measuring 1.9 x 1.0 cm (image 26), previously 2.0 x 1.2 cm.  Central airways are patent. Interval development of a 1.4 x 0.9 cm mass within the left upper lobe (image 19; series 5). Multiple additional irregular left upper lobe pulmonary nodules have increased in size when compared to prior examination including a 0.9 cm nodule (image 15), previously measuring 0.7 cm; 0.8 cm left upper lobe nodule (image 11), previously measuring 0.4 cm ; 1.0 cm irregular lingular nodule (image 38), previously measuring 0.7 cm. Centrilobular emphysematous change. No pleural effusion or pneumothorax.    CT ABDOMEN AND PELVIS FINDINGS  Liver is normal in size and contour without focal hepatic lesion identified. Portal vein is patent. Gallbladder is unremarkable. Spleen, pancreas and right adrenal gland are unremarkable. Unchanged 1.7 x 1.0 cm left adrenal nodule.  Kidneys enhance symmetrically with contrast. No hydronephrosis. Extensive calcified atherosclerotic plaque involving the abdominal aorta. Postsurgical change left inguinal region. Left femoral bypass graft and arterial stenting, incompletely evaluated. Urinary bladder is unremarkable. Prostate is enlarged.  No abnormal bowel wall thickening. No evidence for bowel obstruction. No free fluid or free intraperitoneal air.  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 No aggressive or acute appearing osseous lesions    IMPRESSION: 1. Interval development of a new 1.4 cm left upper lobe mass. Additionally multiple irregular left upper lobe pulmonary nodules have increased in size when compared to prior examination. This is concerning for progressive metastatic disease. 2. No significant interval change in size of mediastinal mass.   Electronically Signed   By: Lovey Newcomer M.D.   On: 04/14/2013 15:16    ASSESSMENT AND PLAN: This is a very pleasant 63 years old white male with metastatic non-small cell lung cancer, squamous cell carcinoma status post treatment with chemotherapy in the form of carboplatin and Abraxane completed 5 cycles.  His recent CT scan of  the chest, abdomen and pelvis showed interval development of a new 1.4 CM left upper lobe nodule in addition to slight increase in the size of the small pulmonary nodules. I discussed the scan results with the patient and his wife have given him the option of referral to radiation oncology for consideration of stereotactic radiotherapy to the new left upper lobe lung nodule versus continuous observation and close monitoring with repeat CT scan of the chest, abdomen and pelvis in 2 months for reevaluation of his disease. The patient would like to continue on observation for now.  He was advised to call immediately if he has any concerning symptoms in the interval.  The patient voices understanding of current disease status and treatment options and is in agreement with the current care plan.  All questions were answered. The patient knows to call the clinic with any problems, questions or concerns. We can certainly see the patient much sooner if necessary.  Disclaimer: This note was dictated with voice recognition software. Similar sounding words can inadvertently be transcribed and may not be corrected upon review.

## 2013-05-02 ENCOUNTER — Encounter: Payer: Self-pay | Admitting: Surgery

## 2013-05-05 ENCOUNTER — Ambulatory Visit (HOSPITAL_COMMUNITY)
Admission: RE | Admit: 2013-05-05 | Discharge: 2013-05-05 | Disposition: A | Discharge status: Home / Self Care | Payer: Medicare Other | Source: Ambulatory Visit | Attending: Surgery | Admitting: Surgery

## 2013-05-05 ENCOUNTER — Encounter: Payer: Self-pay | Admitting: Surgery

## 2013-05-05 ENCOUNTER — Other Ambulatory Visit: Payer: Self-pay | Admitting: Internal Medicine

## 2013-05-05 ENCOUNTER — Ambulatory Visit (INDEPENDENT_AMBULATORY_CARE_PROVIDER_SITE_OTHER): Payer: Medicare Other | Admitting: Surgery

## 2013-05-05 VITALS — BP 147/70 | HR 84 | Ht 67.0 in | Wt 133.0 lb

## 2013-05-05 DIAGNOSIS — L98499 Non-pressure chronic ulcer of skin of other sites with unspecified severity: Principal | ICD-10-CM | POA: Insufficient documentation

## 2013-05-05 DIAGNOSIS — I739 Peripheral vascular disease, unspecified: Secondary | ICD-10-CM

## 2013-05-05 DIAGNOSIS — Z48812 Encounter for surgical aftercare following surgery on the circulatory system: Secondary | ICD-10-CM | POA: Insufficient documentation

## 2013-05-05 NOTE — Addendum Note (Signed)
Addended by: Mena Goes on: 05/05/2013 06:49 PM   Modules accepted: Orders

## 2013-05-05 NOTE — Progress Notes (Signed)
Patient name: [name] MRN: [id number] DOB: 1950/06/10 Sex: male     Chief Complaint  Patient presents with  . Re-evaluation    3 month f/u     HISTORY OF PRESENT ILLNESS: The patient is back today for followup. He is status post left femoral to above-knee popliteal artery bypass graft with 6 mm Gore-Tex on 09/03/2012. He has an extensive wound on the lateral aspect of his foot and underwent operative debridement on 09/26/2012.  His wound has completely healed and he has not place any dressing on it for over one month.  He does complain of some pain on the bottom of his foot when he walks. A new spot on his lung was recently detected, this is going to be followed initially   Past Medical History  Diagnosis Date  . BENIGN PROSTATIC HYPERTROPHY 12/24/2009  . CEREBROVASCULAR ACCIDENT, HX OF 12/24/2009  . HYPERLIPIDEMIA 12/24/2009  . HYPERTENSION 12/24/2009  . Testosterone deficiency   . ED (erectile dysfunction)   . Claudication in peripheral vascular disease, lifestyle limiting, with abnormal arterial dopplers of lower ext. 06/30/2011  . S/P angioplasty with stent to Lt. SFA 06/30/11 06/30/2011  . COPD 12/24/2009  . Shortness of breath     "from the COPD" (10/13/2011)  . Stroke 1996    "right side not as strong as left since; numbness R leg/foot" (10/13/2011)  . Pernicious anemia   . Complication of anesthesia     hard to wake up  . Lung cancer   . Pneumonia     Past Surgical History  Procedure Laterality Date  . Cervical fusion  1980's    "total of 3-4 OR's on my neck; pinched nerve" (10/13/2011)  . Aneurysm coiling  2007  . Peripheral arterial stent graft  06/30/2010; 10/13/10    left; left  . Video bronchoscopy Bilateral 04/03/2012    Procedure: VIDEO BRONCHOSCOPY WITHOUT FLUORO;  Surgeon: Tanda Rockers, MD;  Location: [facility];  Service: Cardiopulmonary;  Laterality: Bilateral;  . Amputation Left 08/26/2012    Procedure: IRRIGATION AND DEBRIDMENT LEFT DORSAL ABSCESS/  4TH AND 5TH LEFT RAY AMPUTATION ;  Surgeon: Wylene Simmer, MD;  Location: [facility];  Service: Orthopedics;  Laterality: Left;  . Femoral-popliteal bypass graft Left 09/03/2012    Procedure: BYPASS GRAFT FEMORAL-POPLITEAL ARTERY;  Surgeon: Serafina Mitchell, MD;  Location: [facility];  Service: Vascular;  Laterality: Left;  . Cardiac catheterization    . Colonoscopy      hx: of  . I&d extremity Left 09/26/2012    Procedure: IRRIGATION AND DEBRIDEMENT EXTREMITY- LEFT FOOT WITH WOUND VAC PLACEMENT;  Surgeon: Serafina Mitchell, MD;  Location: [facility];  Service: Vascular;  Laterality: Left;    History   Social History  . Marital Status: Married    Spouse Name: N/A    Number of Children: 3  . Years of Education: N/A   Occupational History  . Retired     Press photographer   Social History Main Topics  . Smoking status: Current Every Day Smoker -- 0.60 packs/day for 45 years    Types: Cigarettes  . Smokeless tobacco: Never Used  . Alcohol Use: No  . Drug Use: No  . Sexual Activity: Not Currently   Other Topics Concern  . Not on file   Social History Narrative  . No narrative on file    Family History  Problem Relation Age of Onset  . Emphysema Father     smoked  .  Emphysema Paternal Uncle     smoked  . Heart disease Mother   . Lung cancer Father     smoked  . Lung cancer Paternal Grandfather     smoked    Allergies as of 05/05/2013  . (No Known Allergies)    Current Outpatient Prescriptions on File Prior to Visit  Medication Sig Dispense Refill  . acetaminophen (TYLENOL) 500 MG tablet Take 1,000 mg by mouth every 6 (six) hours as needed for pain.       Marland Kitchen aspirin 81 MG tablet Take 81 mg by mouth daily.      Marland Kitchen atorvastatin (LIPITOR) 20 MG tablet Take 1 tablet (20 mg total) by mouth daily.  90 tablet  1  . budesonide-formoterol (SYMBICORT) 160-4.5 MCG/ACT inhaler Inhale 2 puffs into the lungs 2 (two) times daily as needed (for shortness of breath).      . collagenase (SANTYL) ointment Apply  topically as directed.  15 g  1  . doxycycline (VIBRA-TABS) 100 MG tablet Take 1 tablet (100 mg total) by mouth 2 (two) times daily.  60 tablet  3  . feeding supplement (ENSURE COMPLETE) LIQD Take 237 mLs by mouth 2 (two) times daily between meals.      . gabapentin (NEURONTIN) 600 MG tablet Take 1 tablet (600 mg total) by mouth every morning.  90 tablet  3  . oxyCODONE (ROXICODONE) 5 MG immediate release tablet Take 1-2 tablets (5-10 mg total) by mouth every 6 (six) hours as needed for pain.  30 tablet  0  . tamsulosin (FLOMAX) 0.4 MG CAPS capsule Take 1 capsule (0.4 mg total) by mouth daily.  90 capsule  3  . terbinafine (LAMISIL) 250 MG tablet Take 1 tablet (250 mg total) by mouth daily.  30 tablet  5  . HYDROcodone-acetaminophen (NORCO/VICODIN) 5-325 MG per tablet Take 1 tablet by mouth every 6 (six) hours as needed for moderate pain.  30 tablet  0   No current facility-administered medications on file prior to visit.     REVIEW OF SYSTEMS: Please see history of present illness.  The patient also complains of leg swelling.  Other than that all systems are negative  PHYSICAL EXAMINATION:   Vital signs are BP 147/70  Pulse 84  Ht 5\' 7"  (1.702 m)  Wt 133 lb (60.328 kg)  BMI 20.83 kg/m2  SpO2 97% General: The patient appears their stated age. HEENT:  No gross abnormalities Pulmonary:  Non labored breathing Musculoskeletal: There are no major deformities. Neurologic: No focal weakness or paresthesias are detected, Skin: Left foot ulcer is completely healed Psychiatric: The patient has normal affect. Cardiovascular: There is a regular rate and rhythm without significant murmur appreciated.  1+ edema to the left leg.   Diagnostic Studies Duplex ultrasound was ordered and reviewed.  This shows a widely patent left femoral popliteal bypass graft.  The ankle-brachial index is 1.18 with biphasic waveforms.  Assessment: Status post left femoral-popliteal bypass graft for ulcer  disease Plan: The patient's foot has completely healed.  Ultrasound today shows a widely patent bypass graft.  The patient will followup in 6 months for repeat ultrasound imaging.  Eldridge Abrahams, M.D. [facility] Office: [phone] Pager:  [phone]

## 2013-05-06 ENCOUNTER — Telehealth: Payer: Self-pay | Admitting: Internal Medicine

## 2013-05-06 NOTE — Telephone Encounter (Signed)
Relevant patient education assigned to patient using Emmi. ° °

## 2013-05-14 ENCOUNTER — Encounter: Payer: Self-pay | Admitting: Infectious Disease

## 2013-05-14 ENCOUNTER — Ambulatory Visit (INDEPENDENT_AMBULATORY_CARE_PROVIDER_SITE_OTHER): Payer: Medicare Other | Admitting: Infectious Disease

## 2013-05-14 VITALS — BP 106/62 | HR 102 | Temp 97.7°F | Ht 66.0 in | Wt 137.0 lb

## 2013-05-14 DIAGNOSIS — L98499 Non-pressure chronic ulcer of skin of other sites with unspecified severity: Secondary | ICD-10-CM

## 2013-05-14 DIAGNOSIS — I739 Peripheral vascular disease, unspecified: Secondary | ICD-10-CM

## 2013-05-14 DIAGNOSIS — I96 Gangrene, not elsewhere classified: Secondary | ICD-10-CM

## 2013-05-14 DIAGNOSIS — F17200 Nicotine dependence, unspecified, uncomplicated: Secondary | ICD-10-CM

## 2013-05-14 DIAGNOSIS — M869 Osteomyelitis, unspecified: Secondary | ICD-10-CM

## 2013-05-14 DIAGNOSIS — C7800 Secondary malignant neoplasm of unspecified lung: Secondary | ICD-10-CM

## 2013-05-14 DIAGNOSIS — B351 Tinea unguium: Secondary | ICD-10-CM

## 2013-05-14 LAB — SEDIMENTATION RATE: SED RATE: 12 mm/h (ref 0–16)

## 2013-05-14 LAB — C-REACTIVE PROTEIN: CRP: 1.9 mg/dL — AB (ref <0.60)

## 2013-05-14 MED ORDER — TERBINAFINE HCL 250 MG PO TABS: 250.0000 mg | ORAL_TABLET | Freq: Every day | ORAL | Status: DC

## 2013-05-14 NOTE — Progress Notes (Signed)
Subjective:    Patient ID: [name], male    DOB: 03-22-1950, 63 y.o.   MRN: [id number]  HPI   63 y.o. male with Gangrene, abscess, osteomyelitis sp 4th, 5th ray amputaion and I and D of foot abscess. Cultures were no growth (on abx). There were dense major revascularization percutaneously and ultimately the patient underwent vascular surgery on 09/03/12 with:  Left femoral to above-knee popliteal artery bypass with 6 mm propatent PTFE  #2: [place], left great saphenous vein  He hadcontinued on IV vancomycin and cefepime and flagyl since then. His wound bed still appears necrotic when I saw him earlier this month withpersistent black necrotic tissue in bed. He was seen by [facility] who performed I and D in hospital and applied wound vacuum. They dc the IV abx and placed him on oral Augmentin 3 times daily which he then ultimately stopped  He had failed to make his followup with me and his home health nurse was concerned by the appearance of the wound at the top of his foot which had having more of a greenish discharge.   He at that time was having more material sucked into his vacuum collection device.  We obtained plain films of the foot and ankle that time which failed to show any evidence of new sites of osteomyelitis.  I placed him on oral doxycycline twice daily and since then he has continued to improve.  He has continued to be followed by [facility].   I last saw him in February and his ESR and CRp were normal and he was doing well.    Review of Systems  Constitutional: Negative for fever, chills, diaphoresis, activity change, appetite change, fatigue and unexpected weight change.  HENT: Negative for congestion, rhinorrhea, sinus pressure, sneezing, sore throat and trouble swallowing.   Eyes: Negative for photophobia and visual disturbance.  Respiratory: Negative for cough, chest tightness, shortness of breath, wheezing and stridor.   Cardiovascular: Negative for chest pain, palpitations  and leg swelling.  Gastrointestinal: Negative for nausea, vomiting, abdominal pain, diarrhea, constipation, blood in stool, abdominal distention and anal bleeding.  Genitourinary: Negative for dysuria, hematuria, flank pain and difficulty urinating.  Musculoskeletal: Negative for arthralgias, back pain, gait problem, joint swelling and myalgias.  Skin: Positive for color change and wound. Negative for pallor and rash.  Neurological: Negative for dizziness, tremors, weakness and light-headedness.  Hematological: Negative for adenopathy. Does not bruise/bleed easily.  Psychiatric/Behavioral: Negative for behavioral problems, confusion, sleep disturbance, dysphoric mood, decreased concentration and agitation.       Objective:   Physical Exam  Constitutional: He is oriented to person, place, and time. He appears well-developed and well-nourished. No distress.  HENT:  Head: Normocephalic and atraumatic.  Mouth/Throat: Oropharynx is clear and moist.  Eyes: Conjunctivae and EOM are normal. Pupils are equal, round, and reactive to light. No scleral icterus.  Neck: Normal range of motion. Neck supple.  Cardiovascular: Normal rate, regular rhythm and normal heart sounds.  Exam reveals no gallop and no friction rub.   No murmur heard. Pulmonary/Chest: Effort normal. No respiratory distress.  Abdominal: He exhibits no distension. There is no tenderness.  Musculoskeletal: He exhibits edema. He exhibits no tenderness.       Feet:  Neurological: He is alert and oriented to person, place, and time.  Skin: Skin is warm and dry. He is not diaphoretic.  Psychiatric: He has a normal mood and affect. Judgment normal.   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 With  ulcer on dorsum and heel.  --will stop his doxycyline today if his ESR and CRP are reassuring --which they were  rtc in 3 months   ONychomycosis: renewed lamisil for 6 months systemically  Metastatic non-small cell lung cancer, squamous cell carcinoma: being followed by Dr. Earlie Server

## 2013-05-21 ENCOUNTER — Encounter: Payer: Self-pay | Admitting: Internal Medicine

## 2013-05-22 ENCOUNTER — Ambulatory Visit (INDEPENDENT_AMBULATORY_CARE_PROVIDER_SITE_OTHER)
Admission: RE | Admit: 2013-05-22 | Discharge: 2013-05-22 | Disposition: A | Discharge status: Home / Self Care | Payer: Medicare Other | Source: Ambulatory Visit | Attending: Internal Medicine | Admitting: Internal Medicine

## 2013-05-22 ENCOUNTER — Telehealth: Payer: Self-pay | Admitting: Medical Oncology

## 2013-05-22 ENCOUNTER — Telehealth: Payer: Self-pay | Admitting: Internal Medicine

## 2013-05-22 ENCOUNTER — Ambulatory Visit (INDEPENDENT_AMBULATORY_CARE_PROVIDER_SITE_OTHER): Payer: Medicare Other | Admitting: Internal Medicine

## 2013-05-22 ENCOUNTER — Other Ambulatory Visit: Payer: Self-pay | Admitting: Internal Medicine

## 2013-05-22 ENCOUNTER — Encounter: Payer: Self-pay | Admitting: Internal Medicine

## 2013-05-22 VITALS — BP 150/80 | HR 104 | Temp 97.8°F | Wt 138.0 lb

## 2013-05-22 DIAGNOSIS — E871 Hypo-osmolality and hyponatremia: Secondary | ICD-10-CM

## 2013-05-22 DIAGNOSIS — I739 Peripheral vascular disease, unspecified: Secondary | ICD-10-CM

## 2013-05-22 DIAGNOSIS — R2681 Unsteadiness on feet: Secondary | ICD-10-CM

## 2013-05-22 DIAGNOSIS — C3490 Malignant neoplasm of unspecified part of unspecified bronchus or lung: Secondary | ICD-10-CM

## 2013-05-22 DIAGNOSIS — C3492 Malignant neoplasm of unspecified part of left bronchus or lung: Secondary | ICD-10-CM

## 2013-05-22 DIAGNOSIS — D709 Neutropenia, unspecified: Secondary | ICD-10-CM

## 2013-05-22 DIAGNOSIS — C7931 Secondary malignant neoplasm of brain: Secondary | ICD-10-CM

## 2013-05-22 DIAGNOSIS — E876 Hypokalemia: Secondary | ICD-10-CM

## 2013-05-22 DIAGNOSIS — R269 Unspecified abnormalities of gait and mobility: Secondary | ICD-10-CM

## 2013-05-22 DIAGNOSIS — D696 Thrombocytopenia, unspecified: Secondary | ICD-10-CM

## 2013-05-22 DIAGNOSIS — L98499 Non-pressure chronic ulcer of skin of other sites with unspecified severity: Secondary | ICD-10-CM

## 2013-05-22 HISTORY — DX: Secondary malignant neoplasm of brain: C79.31

## 2013-05-22 LAB — CBC WITH DIFFERENTIAL/PLATELET
BASOS PCT: 0.7 % (ref 0.0–3.0)
Basophils Absolute: 0 10*3/uL (ref 0.0–0.1)
EOS ABS: 0 10*3/uL (ref 0.0–0.7)
Eosinophils Relative: 0.7 % (ref 0.0–5.0)
HCT: 35.9 % — ABNORMAL LOW (ref 39.0–52.0)
HEMOGLOBIN: 12 g/dL — AB (ref 13.0–17.0)
Lymphocytes Relative: 18 % (ref 12.0–46.0)
Lymphs Abs: 1.2 10*3/uL (ref 0.7–4.0)
MCHC: 33.4 g/dL (ref 30.0–36.0)
MCV: 94.6 fl (ref 78.0–100.0)
MONO ABS: 0.4 10*3/uL (ref 0.1–1.0)
Monocytes Relative: 5.4 % (ref 3.0–12.0)
NEUTROS ABS: 5.1 10*3/uL (ref 1.4–7.7)
Neutrophils Relative %: 75.2 % (ref 43.0–77.0)
Platelets: 292 10*3/uL (ref 150.0–400.0)
RBC: 3.79 Mil/uL — ABNORMAL LOW (ref 4.22–5.81)
RDW: 15.4 % (ref 11.5–15.5)
WBC: 6.7 10*3/uL (ref 4.0–10.5)

## 2013-05-22 LAB — COMPREHENSIVE METABOLIC PANEL
ALBUMIN: 3.9 g/dL (ref 3.5–5.2)
ALT: 9 U/L (ref 0–53)
AST: 13 U/L (ref 0–37)
Alkaline Phosphatase: 99 U/L (ref 39–117)
BUN: 12 mg/dL (ref 6–23)
CHLORIDE: 98 meq/L (ref 96–112)
CO2: 27 mEq/L (ref 19–32)
CREATININE: 0.9 mg/dL (ref 0.4–1.5)
Calcium: 9.3 mg/dL (ref 8.4–10.5)
GFR: 88.09 mL/min (ref >60.00)
Glucose, Bld: 94 mg/dL (ref 70–99)
POTASSIUM: 3.8 meq/L (ref 3.5–5.1)
Sodium: 132 mEq/L — ABNORMAL LOW (ref 135–145)
Total Bilirubin: 0.7 mg/dL (ref 0.2–1.2)
Total Protein: 7.6 g/dL (ref 6.0–8.3)

## 2013-05-22 LAB — MAGNESIUM: Magnesium: 1.7 mg/dL (ref 1.5–2.5)

## 2013-05-22 MED ORDER — DEXAMETHASONE 4 MG PO TABS: 4.0000 mg | ORAL_TABLET | Freq: Four times a day (QID) | ORAL | Status: DC

## 2013-05-22 NOTE — Progress Notes (Signed)
Subjective:    Patient ID: [name], male    DOB: 05-24-1950, 63 y.o.   MRN: [id number]  HPI  BP Readings from Last 3 Encounters:  05/22/13 150/80  05/14/13 106/62  05/05/13 147/70   Wt Readings from Last 3 Encounters:  05/22/13 138 lb (62.596 kg)  05/14/13 137 lb (62.143 kg)  05/05/13 133 lb (60.47 kg)   63 year old patient who has a history of stage IV lung cancer as well as peripheral and cerebrovascular disease.  He has a history of hypertension and presently is controlled off medication.  More recently, he has had some unsteady gait and has fallen single occasion.  He attributes this to his partial left foot amputation.  He is now using a walker.  He complains of feeling a bit days unsteady and with dizziness.  He does have a history of electrolyte abnormalities.  Serum sodium was 135 on March 3. Denies any headaches or other focal neurological symptoms.  Past Medical History  Diagnosis Date  . BENIGN PROSTATIC HYPERTROPHY 12/24/2009  . CEREBROVASCULAR ACCIDENT, HX OF 12/24/2009  . HYPERLIPIDEMIA 12/24/2009  . HYPERTENSION 12/24/2009  . Testosterone deficiency   . ED (erectile dysfunction)   . Claudication in peripheral vascular disease, lifestyle limiting, with abnormal arterial dopplers of lower ext. 06/30/2011  . S/P angioplasty with stent to Lt. SFA 06/30/11 06/30/2011  . COPD 12/24/2009  . Shortness of breath     "from the COPD" (10/13/2011)  . Stroke 1996    "right side not as strong as left since; numbness R leg/foot" (10/13/2011)  . Pernicious anemia   . Complication of anesthesia     hard to wake up  . Lung cancer   . Pneumonia     History   Social History  . Marital Status: Married    Spouse Name: N/A    Number of Children: 3  . Years of Education: N/A   Occupational History  . Retired     Press photographer   Social History Main Topics  . Smoking status: Current Every Day Smoker -- 1.00 packs/day for 45 years    Types: Cigarettes  . Smokeless tobacco: Never  Used     Comment: cutting back  . Alcohol Use: No  . Drug Use: No  . Sexual Activity: Not Currently   Other Topics Concern  . Not on file   Social History Narrative  . No narrative on file    Past Surgical History  Procedure Laterality Date  . Cervical fusion  1980's    "total of 3-4 OR's on my neck; pinched nerve" (10/13/2011)  . Aneurysm coiling  2007  . Peripheral arterial stent graft  06/30/2010; 10/13/10    left; left  . Video bronchoscopy Bilateral 04/03/2012    Procedure: VIDEO BRONCHOSCOPY WITHOUT FLUORO;  Surgeon: Tanda Rockers, MD;  Location: [facility];  Service: Cardiopulmonary;  Laterality: Bilateral;  . Amputation Left 08/26/2012    Procedure: IRRIGATION AND DEBRIDMENT LEFT DORSAL ABSCESS/ 4TH AND 5TH LEFT RAY AMPUTATION ;  Surgeon: Wylene Simmer, MD;  Location: [facility];  Service: Orthopedics;  Laterality: Left;  . Femoral-popliteal bypass graft Left 09/03/2012    Procedure: BYPASS GRAFT FEMORAL-POPLITEAL ARTERY;  Surgeon: Serafina Mitchell, MD;  Location: [facility];  Service: Vascular;  Laterality: Left;  . Cardiac catheterization    . Colonoscopy      hx: of  . I&d extremity Left 09/26/2012    Procedure: IRRIGATION AND DEBRIDEMENT EXTREMITY- LEFT FOOT WITH WOUND VAC PLACEMENT;  Surgeon: Serafina Mitchell, MD;  Location: [facility];  Service: Vascular;  Laterality: Left;    Family History  Problem Relation Age of Onset  . Emphysema Father     smoked  . Emphysema Paternal Uncle     smoked  . Heart disease Mother   . Lung cancer Father     smoked  . Lung cancer Paternal Grandfather     smoked    No Known Allergies  Current Outpatient Prescriptions on File Prior to Visit  Medication Sig Dispense Refill  . acetaminophen (TYLENOL) 500 MG tablet Take 1,000 mg by mouth every 6 (six) hours as needed for pain.       Marland Kitchen aspirin 81 MG tablet Take 81 mg by mouth daily.      Marland Kitchen atorvastatin (LIPITOR) 20 MG tablet Take 1 tablet (20 mg total) by mouth daily.  90 tablet  1  .  budesonide-formoterol (SYMBICORT) 160-4.5 MCG/ACT inhaler Inhale 2 puffs into the lungs 2 (two) times daily as needed (for shortness of breath).      . feeding supplement (ENSURE COMPLETE) LIQD Take 237 mLs by mouth 2 (two) times daily between meals.      . gabapentin (NEURONTIN) 600 MG tablet Take 1 tablet (600 mg total) by mouth every morning.  90 tablet  3  . nortriptyline (PAMELOR) 75 MG capsule TAKE 1 CAPSULE AT BEDTIME  90 capsule  1  . tamsulosin (FLOMAX) 0.4 MG CAPS capsule Take 1 capsule (0.4 mg total) by mouth daily.  90 capsule  3  . terbinafine (LAMISIL) 250 MG tablet Take 1 tablet (250 mg total) by mouth daily.  30 tablet  5   No current facility-administered medications on file prior to visit.    BP 150/80  Pulse 104  Temp(Src) 97.8 F (36.6 C) (Oral)  Wt 138 lb (62.596 kg)  SpO2 92%       Review of Systems  Constitutional: Negative for fever, chills, appetite change and fatigue.  HENT: Negative for congestion, dental problem, ear pain, hearing loss, sore throat, tinnitus, trouble swallowing and voice change.   Eyes: Negative for pain, discharge and visual disturbance.  Respiratory: Negative for cough, chest tightness, wheezing and stridor.   Cardiovascular: Negative for chest pain, palpitations and leg swelling.  Gastrointestinal: Negative for nausea, vomiting, abdominal pain, diarrhea, constipation, blood in stool and abdominal distention.  Genitourinary: Negative for urgency, hematuria, flank pain, discharge, difficulty urinating and genital sores.  Musculoskeletal: Positive for gait problem. Negative for arthralgias, back pain, joint swelling, myalgias and neck stiffness.  Skin: Negative for rash.  Neurological: Positive for dizziness, weakness and light-headedness. Negative for syncope, speech difficulty, numbness and headaches.  Hematological: Negative for adenopathy. Does not bruise/bleed easily.  Psychiatric/Behavioral: Negative for behavioral problems and  dysphoric mood. The patient is not nervous/anxious.        Objective:   Physical Exam  Constitutional: He is oriented to person, place, and time. He appears well-developed. No distress.  Appears weak Walks with a cane Repeat blood pressure 120/70  HENT:  Head: Normocephalic.  Right Ear: External ear normal.  Left Ear: External ear normal.  Eyes: Conjunctivae and EOM are normal.  Neck: Normal range of motion.  Cardiovascular: Normal rate and normal heart sounds.   Pulmonary/Chest: Effort normal.  Generally diminished breath sounds  Abdominal: Bowel sounds are normal.  Musculoskeletal: Normal range of motion. He exhibits no edema and no tenderness.  Neurological: He is oriented to person, place, and time.  Appears slightly  somnolent Cranial nerve examination largely unremarkable.  Suggestion of blunting of the left nasal labial fold No drift Normal finger to nose testing Heel-to-shin testing slightly impaired bilaterally Plantar response absent Gait is very unsteady, but not grossly ataxic  Psychiatric: He has a normal mood and affect. His behavior is normal.          Assessment & Plan:  Stage IV lung cancer with abnormal gait.  Wife also feels there has been some mental status changes.  We'll check lab including electrolytes, and renal function indices and schedule a head CT Hypertension.  Remains normotensive off medication Cerebrovascular disease PA D.

## 2013-05-22 NOTE — Telephone Encounter (Signed)
Scheduled pt, Diane  RN has notified pt , email Santiago Glad @ [facility] for referral

## 2013-05-22 NOTE — Telephone Encounter (Signed)
Wife notified of appt.tomorrow.

## 2013-05-22 NOTE — Patient Instructions (Signed)
Head CT scan as scheduled  Call or return to clinic prn if these symptoms worsen or fail to improve as anticipated.

## 2013-05-23 ENCOUNTER — Ambulatory Visit
Admission: RE | Admit: 2013-05-23 | Discharge: 2013-05-23 | Disposition: A | Discharge status: Home / Self Care | Payer: Medicare Other | Source: Ambulatory Visit | Attending: Internal Medicine | Admitting: Internal Medicine

## 2013-05-23 ENCOUNTER — Encounter: Payer: Self-pay | Admitting: Internal Medicine

## 2013-05-23 ENCOUNTER — Telehealth: Payer: Self-pay | Admitting: Internal Medicine

## 2013-05-23 ENCOUNTER — Encounter: Payer: Self-pay | Admitting: Radiation Oncology

## 2013-05-23 ENCOUNTER — Ambulatory Visit (HOSPITAL_BASED_OUTPATIENT_CLINIC_OR_DEPARTMENT_OTHER): Payer: Medicare Other | Admitting: Internal Medicine

## 2013-05-23 VITALS — BP 135/60 | HR 101 | Temp 98.2°F | Resp 18 | Ht 66.0 in | Wt 136.2 lb

## 2013-05-23 DIAGNOSIS — C3492 Malignant neoplasm of unspecified part of left bronchus or lung: Secondary | ICD-10-CM

## 2013-05-23 DIAGNOSIS — C3410 Malignant neoplasm of upper lobe, unspecified bronchus or lung: Secondary | ICD-10-CM

## 2013-05-23 DIAGNOSIS — C7949 Secondary malignant neoplasm of other parts of nervous system: Secondary | ICD-10-CM

## 2013-05-23 DIAGNOSIS — C7931 Secondary malignant neoplasm of brain: Secondary | ICD-10-CM

## 2013-05-23 DIAGNOSIS — F17200 Nicotine dependence, unspecified, uncomplicated: Secondary | ICD-10-CM

## 2013-05-23 MED ORDER — LORAZEPAM 0.5 MG PO TABS: 0.5000 mg | ORAL_TABLET | Freq: Once | ORAL | Status: DC

## 2013-05-23 NOTE — Progress Notes (Addendum)
Location/Histology of Brain Tumor: Metastatic multiple intracranial metastasis/ Lung Cancer  Patient presented with symptoms of:  Unsteady gait, with dizziness ,  falling  recently    Past or anticipated interventions, if any, per neurosurgery:   Past or anticipated interventions, if any, per medical oncology  :  Dr.Mohamed5/10/15 note:PRIOR THERAPY: Systemic chemotherapy with carboplatin for an AUC of 5 given on day 1 and Abraxane at 100 mg per meter squared given on days 1, 8 and 15 every 3 weeks. Status post 4 cycles, as well as day 1 of cycle 5. .  CURRENT THERAPY: Observation.  DISEASE STAGE: Stage IV non-small cell lung cancer, squamous cell carcinoma diagnosed in March of 2014  CHEMOTHERAPY INTENT: Palliative  CURRENT # OF CHEMOTHERAPY CYCLES: 4 + day 1 of cycle 5    MRI 05/27/13 Brain,  appt Dr.Mohamed 06/26/13, Ct head 06/19/13  Dose of Decadron, if applicable: 4mg  oral 4x day rx written 05/22/13  Recent neurologic symptoms, if any: numbness in finger tips and feet  Seizures: no  Headaches: no  Nausea: no  Dizziness/ataxia: yes  Difficulty with hand coordination:   Focal numbness/weakness:  Partial left foot amputation 08/26/12, I&D left foot with wound vac,09/26/12   Visual deficits/changes:   Confusion/Memory deficits: hx CVA, yes per wife some mental status changes 05/22/13 note  Painful bone metastases at present, if any: no  SAFETY ISSUES: yes, unsteady   Prior radiation? No  Pacemaker/ICD?  No   Is the patient on methotrexate? No  Additional Complaints / other details: Married 3 children , new Dx  Lung Ca, CVA Stroke 12/24/09, COPD, , pernicious anemia, s/p angioplasty with stent to Lt SFA 06/30/11 cervical fusion 10/13/11, aneurysm coiling 2007, fem/pop bypass graft 09/03/12, Cardiac catheterization, Bronchoscopy  With  Bx=04/03/12 Smoker 45 years cigarettes  1ppd no alcohol or illicit drug use, Father deceased  Smoker, ,emphysema, paternal grandfather lung ca, mother  heart disease,   Depression/Anxiety, will need rx for ativan for Ct Simulation

## 2013-05-23 NOTE — Patient Instructions (Signed)
Smoking Cessation, Tips for Success If you are ready to quit smoking, congratulations! You have chosen to help yourself be healthier. Cigarettes bring nicotine, tar, carbon monoxide, and other irritants into your body. Your lungs, heart, and blood vessels will be able to work better without these poisons. There are many different ways to quit smoking. Nicotine gum, nicotine patches, a nicotine inhaler, or nicotine nasal spray can help with physical craving. Hypnosis, support groups, and medicines help break the habit of smoking. WHAT THINGS CAN I DO TO MAKE QUITTING EASIER?  Here are some tips to help you quit for good:  Pick a date when you will quit smoking completely. Tell all of your friends and family about your plan to quit on that date.  Do not try to slowly cut down on the number of cigarettes you are smoking. Pick a quit date and quit smoking completely starting on that day.  Throw away all cigarettes.   Clean and remove all ashtrays from your home, work, and car.   On a card, write down your reasons for quitting. Carry the card with you and read it when you get the urge to smoke.   Cleanse your body of nicotine. Drink enough water and fluids to keep your urine clear or pale yellow. Do this after quitting to flush the nicotine from your body.   Learn to predict your moods. Do not let a bad situation be your excuse to have a cigarette. Some situations in your life might tempt you into wanting a cigarette.   Never have "just one" cigarette. It leads to wanting another and another. Remind yourself of your decision to quit.   Change habits associated with smoking. If you smoked while driving or when feeling stressed, try other activities to replace smoking. Stand up when drinking your coffee. Brush your teeth after eating. Sit in a different chair when you read the paper. Avoid alcohol while trying to quit, and try to drink fewer caffeinated beverages. Alcohol and caffeine may urge  you to smoke.   Avoid foods and drinks that can trigger a desire to smoke, such as sugary or spicy foods and alcohol.   Ask people who smoke not to smoke around you.   Have something planned to do right after eating or having a cup of coffee. For example, plan to take a walk or exercise.   Try a relaxation exercise to calm you down and decrease your stress. Remember, you may be tense and nervous for the first 2 weeks after you quit, but this will pass.   Find new activities to keep your hands busy. Play with a pen, coin, or rubber band. Doodle or draw things on paper.   Brush your teeth right after eating. This will help cut down on the craving for the taste of tobacco after meals. You can also try mouthwash.   Use oral substitutes in place of cigarettes. Try using lemon drops, carrots, cinnamon sticks, or chewing gum. Keep them handy so they are available when you have the urge to smoke.   When you have the urge to smoke, try deep breathing.   Designate your home as a nonsmoking area.   If you are a heavy smoker, ask your health care provider about a prescription for nicotine chewing gum. It can ease your withdrawal from nicotine.   Reward yourself. Set aside the cigarette money you save and buy yourself something nice.   Look for support from others. Join a support group or   smoking cessation program. Ask someone at home or at work to help you with your plan to quit smoking.   Always ask yourself, "Do I need this cigarette or is this just a reflex?" Tell yourself, "Today, I choose not to smoke," or "I do not want to smoke." You are reminding yourself of your decision to quit.  Do not replace cigarette smoking with electronic cigarettes (commonly called e-cigarettes). The safety of e-cigarettes is unknown, and some may contain harmful chemicals.  If you relapse, do not give up! Plan ahead and think about what you will do the next time you get the urge to smoke.  HOW WILL  I FEEL WHEN I QUIT SMOKING? You may have symptoms of withdrawal because your body is used to nicotine (the addictive substance in cigarettes). You may crave cigarettes, be irritable, feel very hungry, cough often, get headaches, or have difficulty concentrating. The withdrawal symptoms are only temporary. They are strongest when you first quit but will go away within 10 14 days. When withdrawal symptoms occur, stay in control. Think about your reasons for quitting. Remind yourself that these are signs that your body is healing and getting used to being without cigarettes. Remember that withdrawal symptoms are easier to treat than the major diseases that smoking can cause.  Even after the withdrawal is over, expect periodic urges to smoke. However, these cravings are generally short lived and will go away whether you smoke or not. Do not smoke!  WHAT RESOURCES ARE AVAILABLE TO HELP ME QUIT SMOKING? Your health care provider can direct you to community resources or hospitals for support, which may include:  Group support.  Education.  Hypnosis.  Therapy. Document Released: 10/01/2003 Document Revised: 10/23/2012 Document Reviewed: 06/20/2012 [employer] Patient Information 2014 [employer].  

## 2013-05-23 NOTE — Telephone Encounter (Signed)
gv and printed appt sched and avs for pt for May and June

## 2013-05-23 NOTE — Progress Notes (Signed)
O2 saturation checked at rest on room air:  91% O2 saturation checked while ambulating on room air: 90%  Asked patient to keep [place] posted on his SOB and we can continue to check his O2 levels to see if he qualifies for O2 in the future.  SLJ

## 2013-05-25 ENCOUNTER — Other Ambulatory Visit: Payer: Medicare Other

## 2013-05-25 ENCOUNTER — Encounter: Payer: Self-pay | Admitting: Internal Medicine

## 2013-05-25 NOTE — Progress Notes (Signed)
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   PRIOR THERAPY: Systemic chemotherapy with carboplatin for an AUC of 5 given on day 1 and Abraxane at 100 mg per meter squared given on days 1, 8 and 15 every 3 weeks. Status post 4 cycles, as well as day 1 of cycle 5. .   CURRENT THERAPY: Observation.   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  CURRENT SMOKING STATUS: Current smoker and was strongly advised to quit smoking and offered smoke cessation materials.  ORAL CHEMOTHERAPY AND CONSENT: None  CURRENT BISPHOSPHONATES USE: None  LIVING WILL AND CODE STATUS: Full code   INTERVAL HISTORY: [name] 63 y.o. male returns to the clinic today for followup visit accompanied by his wife. The patient has been complaining recently of increasing fatigue and weakness as well as unsteady gait and change in his mental status. He was seen by his primary care physician Dr. Burnice Logan. He ordered CT scan of the head without contrast on 05/22/2013 and it showed Multiple intracranial masses, most consistent with metastatic disease. A hypo attenuating lesion in the left frontal lobe measures 2.5 x 2.5 cm. Dominant lesion within the high right temporal lobe extending into the right parietal lobe measures 4.0 x 4.5 cm. This causes early enlargement of the right temporal horn. Minimal mass effect, with 5 mm of right-to-left midline shift. Basal cisterns remain patent. A right cerebellar lesion measures 2.0 cm and impresses upon the fourth ventricle. I was  contacted by Dr. Burnice Logan yesterday regarding these findings and I recommended that starting the patient on Decadron currently 4 mg by mouth every 6 hours. He is little bit better today but continues to complain of increasing fatigue and weakness. He denied having any significant chest pain, shortness of breath, cough or hemoptysis. He has no nausea or vomiting. He is here today for evaluation and discussion of his treatment options.    MEDICAL HISTORY: Past Medical History  Diagnosis Date  . BENIGN PROSTATIC HYPERTROPHY 12/24/2009  . CEREBROVASCULAR ACCIDENT, HX OF 12/24/2009  . HYPERLIPIDEMIA 12/24/2009  . HYPERTENSION 12/24/2009  . Testosterone deficiency   . ED (erectile dysfunction)   . Claudication in peripheral vascular disease, lifestyle limiting, with abnormal arterial dopplers of lower ext. 06/30/2011  . S/P angioplasty with stent to Lt. SFA 06/30/11 06/30/2011  . COPD 12/24/2009  . Shortness of breath     "from the COPD" (10/13/2011)  . Stroke 1996    "right side not as strong as left since; numbness R leg/foot" (10/13/2011)  . Pernicious anemia   . Complication of anesthesia     hard to wake up  . Pneumonia   . 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 Brain metastases 05/22/13    ct head     ALLERGIES:  has No Known Allergies.  MEDICATIONS:  Current Outpatient Prescriptions  Medication Sig Dispense Refill  . acetaminophen (TYLENOL) 500 MG tablet Take 1,000 mg by mouth every 6 (six) hours as needed for pain.       Marland Kitchen aspirin 81 MG  tablet Take 81 mg by mouth daily.      Marland Kitchen atorvastatin (LIPITOR) 20 MG tablet Take 1 tablet (20 mg total) by mouth daily.  90 tablet  1  . budesonide-formoterol (SYMBICORT) 160-4.5 MCG/ACT inhaler Inhale 2 puffs into the lungs 2 (two) times daily as needed (for shortness of breath).      Marland Kitchen dexamethasone (DECADRON) 4 MG tablet Take 1 tablet (4 mg total) by mouth 4 (four) times daily.  50 tablet  3  . feeding supplement (ENSURE  COMPLETE) LIQD Take 237 mLs by mouth 2 (two) times daily between meals.      . gabapentin (NEURONTIN) 600 MG tablet Take 1 tablet (600 mg total) by mouth every morning.  90 tablet  3  . nortriptyline (PAMELOR) 75 MG capsule TAKE 1 CAPSULE AT BEDTIME  90 capsule  1  . tamsulosin (FLOMAX) 0.4 MG CAPS capsule Take 1 capsule (0.4 mg total) by mouth daily.  90 capsule  3  . terbinafine (LAMISIL) 250 MG tablet Take 1 tablet (250 mg total) by mouth daily.  30 tablet  5  . LORazepam (ATIVAN) 0.5 MG tablet Take 1 tablet (0.5 mg total) by mouth once. 1 tablet 30 minutes before MRI  1 tablet  0   No current facility-administered medications for this visit.    SURGICAL HISTORY:  Past Surgical History  Procedure Laterality Date  . Cervical fusion  1980's    "total of 3-4 OR's on my neck; pinched nerve" (10/13/2011)  . Aneurysm coiling  2007  . Peripheral arterial stent graft  06/30/2010; 10/13/10    left; left  . Video bronchoscopy Bilateral 04/03/2012    Procedure: VIDEO BRONCHOSCOPY WITHOUT FLUORO;  Surgeon: Tanda Rockers, MD;  Location: [facility];  Service: Cardiopulmonary;  Laterality: Bilateral;  . Amputation Left 08/26/2012    Procedure: IRRIGATION AND DEBRIDMENT LEFT DORSAL ABSCESS/ 4TH AND 5TH LEFT RAY AMPUTATION ;  Surgeon: Wylene Simmer, MD;  Location: [facility];  Service: Orthopedics;  Laterality: Left;  . Femoral-popliteal bypass graft Left 09/03/2012    Procedure: BYPASS GRAFT FEMORAL-POPLITEAL ARTERY;  Surgeon: Serafina Mitchell, MD;  Location: [facility];  Service: Vascular;  Laterality: Left;  . Cardiac catheterization    . Colonoscopy      hx: of  . I&d extremity Left 09/26/2012    Procedure: IRRIGATION AND DEBRIDEMENT EXTREMITY- LEFT FOOT WITH WOUND VAC PLACEMENT;  Surgeon: Serafina Mitchell, MD;  Location: [facility];  Service: Vascular;  Laterality: Left;    REVIEW OF SYSTEMS:  Constitutional: positive for fatigue Eyes: negative Ears, nose, mouth, throat, and face: negative Respiratory: positive  for dyspnea on exertion Cardiovascular: negative Gastrointestinal: negative Genitourinary:negative Integument/breast: negative Hematologic/lymphatic: negative Musculoskeletal:positive for muscle weakness Neurological: positive for coordination problems, gait problems and weakness Behavioral/Psych: negative Endocrine: negative Allergic/Immunologic: negative   PHYSICAL EXAMINATION: General appearance: alert, cooperative, appears stated age, fatigued and no distress Head: Normocephalic, without obvious abnormality, atraumatic Neck: no adenopathy, no JVD, supple, symmetrical, trachea midline and thyroid not enlarged, symmetric, no tenderness/mass/nodules Lymph nodes: Cervical, supraclavicular, and axillary nodes normal. Resp: clear to auscultation bilaterally Back: symmetric, no curvature. ROM normal. No CVA tenderness. Cardio: regular rate and rhythm, S1, S2 normal, no murmur, click, rub or gallop GI: soft, non-tender; bowel sounds normal; no masses,  no organomegaly Extremities: edema 1+ left foot Neurologic: Alert and oriented X 3, normal strength and tone. Normal symmetric reflexes. Normal coordination and gait  ECOG PERFORMANCE STATUS: 1 - Symptomatic but completely ambulatory  Blood pressure  135/60, pulse 101, temperature 98.2 F (36.8 C), temperature source Oral, resp. rate 18, height 5\' 6"  (1.676 m), weight 136 lb 3.2 oz (61.78 kg), SpO2 98.00%.  LABORATORY DATA: Lab Results  Component Value Date   WBC 6.7 05/22/2013   HGB 12.0* 05/22/2013   HCT 35.9* 05/22/2013   MCV 94.6 05/22/2013   PLT 292.0 05/22/2013      Chemistry      Component Value Date/Time   NA 132* 05/22/2013 1315   NA 135* 04/14/2013 1135   K 3.8 05/22/2013 1315   K 4.1 04/14/2013 1135   CL 98 05/22/2013 1315   CL 97* 06/13/2012 1300   CO2 27 05/22/2013 1315   CO2 25 04/14/2013 1135   BUN 12 05/22/2013 1315   BUN 11.2 04/14/2013 1135   CREATININE 0.9 05/22/2013 1315   CREATININE 0.8 04/14/2013 1135   CREATININE 0.65  03/03/2013 0908      Component Value Date/Time   CALCIUM 9.3 05/22/2013 1315   CALCIUM 9.7 04/14/2013 1135   ALKPHOS 99 05/22/2013 1315   ALKPHOS 130 04/14/2013 1135   AST 13 05/22/2013 1315   AST 14 04/14/2013 1135   ALT 9 05/22/2013 1315   ALT 8 04/14/2013 1135   BILITOT 0.7 05/22/2013 1315   BILITOT 0.26 04/14/2013 1135       RADIOGRAPHIC STUDIES: Ct Head Wo Contrast  05/22/2013   CLINICAL DATA:  Stage IV lung cancer with unsteady gait.  EXAM: CT HEAD WITHOUT CONTRAST  TECHNIQUE: Contiguous axial images were obtained from the base of the skull through the vertex without intravenous contrast.  COMPARISON:  04/17/2012  FINDINGS: Sinuses/Soft tissues: Left sphenoid sinus mucous retention cyst or polyp. Clear mastoid air cells.  Prior left-sided posterior communicating artery aneurysm.  Intracranial: Multiple intracranial masses, most consistent with metastatic disease. A hypo attenuating lesion in the left frontal lobe measures 2.5 x 2.5 cm on image 27. Dominant lesion within the high right temporal lobe extending into the right parietal lobe measures 4.0 x 4.5 cm on image 15/series 2. This causes early enlargement of the right temporal horn on image 11/series 2. Minimal mass effect, with 5 mm of right-to-left midline shift. Basal cisterns remain patent.  A right cerebellar lesion measures 2.0 cm on image 19/ series 2 and impresses upon the fourth ventricle.  Vertebral atherosclerosis. No complicating infarct, hemorrhage, intra-axial, or extra-axial fluid collection.  IMPRESSION: 1. Development of intracranial metastasis. 2. Dominant right sided cerebral hemisphere lesion causes minimal mass effect and midline shift. 3. Right cerebellar lesion causes mass effect upon the fourth ventricle. No significant proximal hydrocephalus. These results will be called to the ordering clinician or representative by the Radiologist Assistant, and communication documented in the PACS Dashboard.   Electronically Signed   By: Abigail Miyamoto M.D.   On: 05/22/2013 15:32   ASSESSMENT AND PLAN: This is a very pleasant 63 years old white male with metastatic non-small cell lung cancer, squamous cell carcinoma status post treatment with chemotherapy in the form of carboplatin and Abraxane completed 5 cycles.  His recent CT scan of the head without contrast showed multiple metastatic brain lesions. I will order MRI of the brain for further evaluation of these lesions. I recommended for the patient to continue on Decadron 4 mg by mouth every 6 hours and this will be tapered gradually during his radiotherapy.  I requested an urgent referral for the patient to see Dr. Lisbeth Renshaw for evaluation and management of his metastatic brain lesions. The patient  has a scheduled CT scan of the chest, abdomen and pelvis in June of 2015 for evaluation of his systemic disease. I would see him back for followup visit as previously scheduled. If she has any evidence for progression of his systemic disease, I would consider the patient for systemic chemotherapy at that time. The patient and his wife agreed to the current plan. He was advised to call immediately if he has any concerning symptoms in the interval.  The patient voices understanding of current disease status and treatment options and is in agreement with the current care plan.  All questions were answered. The patient knows to call the clinic with any problems, questions or concerns. We can certainly see the patient much sooner if necessary.  Disclaimer: This note was dictated with voice recognition software. Similar sounding words can inadvertently be transcribed and may not be corrected upon review.

## 2013-05-26 ENCOUNTER — Encounter: Payer: Self-pay | Admitting: Radiation Oncology

## 2013-05-26 ENCOUNTER — Ambulatory Visit
Admission: RE | Admit: 2013-05-26 | Discharge: 2013-05-26 | Disposition: A | Discharge status: Home / Self Care | Payer: Medicare Other | Source: Ambulatory Visit | Attending: Radiation Oncology | Admitting: Radiation Oncology

## 2013-05-26 VITALS — BP 144/73 | HR 97 | Temp 98.1°F | Resp 20 | Ht 67.0 in | Wt 142.0 lb

## 2013-05-26 DIAGNOSIS — C3490 Malignant neoplasm of unspecified part of unspecified bronchus or lung: Secondary | ICD-10-CM | POA: Insufficient documentation

## 2013-05-26 DIAGNOSIS — Z510 Encounter for antineoplastic radiation therapy: Secondary | ICD-10-CM | POA: Insufficient documentation

## 2013-05-26 DIAGNOSIS — C7949 Secondary malignant neoplasm of other parts of nervous system: Secondary | ICD-10-CM

## 2013-05-26 DIAGNOSIS — C7931 Secondary malignant neoplasm of brain: Secondary | ICD-10-CM | POA: Insufficient documentation

## 2013-05-26 DIAGNOSIS — C3492 Malignant neoplasm of unspecified part of left bronchus or lung: Secondary | ICD-10-CM

## 2013-05-26 HISTORY — DX: Secondary malignant neoplasm of brain: C79.31

## 2013-05-26 MED ORDER — LORAZEPAM 0.5 MG PO TABS: 0.5000 mg | ORAL_TABLET | Freq: Once | ORAL | Status: DC

## 2013-05-26 NOTE — Progress Notes (Signed)
Please see the Nurse Progress Note in the MD Initial Consult Encounter for this patient. 

## 2013-05-27 ENCOUNTER — Ambulatory Visit
Admission: RE | Admit: 2013-05-27 | Discharge: 2013-05-27 | Disposition: A | Discharge status: Home / Self Care | Payer: Medicare Other | Source: Ambulatory Visit | Attending: Internal Medicine | Admitting: Internal Medicine

## 2013-05-27 DIAGNOSIS — C7931 Secondary malignant neoplasm of brain: Secondary | ICD-10-CM | POA: Insufficient documentation

## 2013-05-27 DIAGNOSIS — C7949 Secondary malignant neoplasm of other parts of nervous system: Secondary | ICD-10-CM

## 2013-05-27 MED ORDER — GADOBENATE DIMEGLUMINE 529 MG/ML IV SOLN
12.0000 mL | Freq: Once | INTRAVENOUS | Status: AC | PRN
  Administered 2013-05-27: 12 mL via INTRAVENOUS

## 2013-05-27 NOTE — Progress Notes (Signed)
Radiation Oncology         [phone] ________________________________  Name: [name] MRN: [id number]  Date: 05/26/2013  DOB: [birth year]  EL:[id number],ENIDP Pilar Plate, MD  Curt Bears, MD     REFERRING PHYSICIAN: Curt Bears, MD   DIAGNOSIS: The primary encounter diagnosis was Brain metastases. Diagnoses of Non-small cell carcinoma of left lung, stage 4 and Secondary malignant neoplasm of brain and spinal cord were also pertinent to this visit.   HISTORY OF PRESENT ILLNESS::[name] is a 63 y.o. male who is seen for an initial consultation visit. The patient has a history of stage IV non-small cell lung cancer, squamous cell carcinoma. The patient was diagnosed with this in March of 2014. He has proceeded with palliative chemotherapy through Dr. Earlie Server. Overall he states he has done well with this treatment. No major difficulties. The patient denies any significant difficulties with breathing at this time. No chest pain.  However, the patient has complained of increasing fatigue and some unsteadiness of gait in recent weeks area he also feels as does his wife that he has had a change in mental status with some memory changes. He denies any headache or nausea. Given these complaints, a CT scan of the head was ordered and this was completed on 05/22/2012. Multiple intracranial masses were seen consistent with metastatic disease. A 2.5 cm mass was present within the left frontal lobe and a 4.5 cm tumor was present within the right parietal lobe as the dominant masses. There also was a right cerebellar lesion measuring 2.0 cm with some compression/effect on the fourth ventricle.  The patient began steroids and he states that his symptoms have been improving. He has been on this for several days. A brain MRI scan has been ordered and this is scheduled for tomorrow. Given these findings I have been asked to see the patient for possible palliative radiation treatment for his  new diagnosis of intracranial metastasis.   PREVIOUS RADIATION THERAPY: No   PAST MEDICAL HISTORY:  has a past medical history of BENIGN PROSTATIC HYPERTROPHY (12/24/2009); CEREBROVASCULAR ACCIDENT, HX OF (12/24/2009); HYPERLIPIDEMIA (12/24/2009); HYPERTENSION (12/24/2009); Testosterone deficiency; ED (erectile dysfunction); Claudication in peripheral vascular disease, lifestyle limiting, with abnormal arterial dopplers of lower ext. (06/30/2011); S/P angioplasty with stent to Lt. SFA 06/30/11 (06/30/2011); COPD (12/24/2009); Shortness of breath; Stroke (1996); Pernicious anemia; Complication of anesthesia; Pneumonia; Lung cancer (04/03/12); and Brain metastases (05/22/13).     PAST SURGICAL HISTORY: Past Surgical History  Procedure Laterality Date  . Cervical fusion  1980's    "total of 3-4 OR's on my neck; pinched nerve" (10/13/2011)  . Aneurysm coiling  2007  . Peripheral arterial stent graft  06/30/2010; 10/13/10    left; left  . Video bronchoscopy Bilateral 04/03/2012    Procedure: VIDEO BRONCHOSCOPY WITHOUT FLUORO;  Surgeon: Tanda Rockers, MD;  Location: [facility];  Service: Cardiopulmonary;  Laterality: Bilateral;  . Amputation Left 08/26/2012    Procedure: IRRIGATION AND DEBRIDMENT LEFT DORSAL ABSCESS/ 4TH AND 5TH LEFT RAY AMPUTATION ;  Surgeon: Wylene Simmer, MD;  Location: [facility];  Service: Orthopedics;  Laterality: Left;  . Femoral-popliteal bypass graft Left 09/03/2012    Procedure: BYPASS GRAFT FEMORAL-POPLITEAL ARTERY;  Surgeon: Serafina Mitchell, MD;  Location: [facility];  Service: Vascular;  Laterality: Left;  . Cardiac catheterization    . Colonoscopy      hx: of  . I&d extremity Left 09/26/2012    Procedure: IRRIGATION AND DEBRIDEMENT EXTREMITY- LEFT FOOT WITH WOUND VAC PLACEMENT;  Surgeon: Serafina Mitchell, MD;  [facility]: [facility] OR;  Service: Vascular;  Laterality: Left;     FAMILY HISTORY: family history includes Emphysema in his father and paternal uncle; Heart disease in his mother; Lung  cancer in his father and paternal grandfather.   SOCIAL HISTORY:  reports that he has been smoking Cigarettes.  He has a 45 pack-year smoking history. He has never used smokeless tobacco. He reports that he does not drink alcohol or use illicit drugs.   ALLERGIES: Review of patient's allergies indicates no known allergies.   MEDICATIONS:  Current Outpatient Prescriptions  Medication Sig Dispense Refill  . aspirin 81 MG tablet Take 81 mg by mouth daily.      Marland Kitchen atorvastatin (LIPITOR) 20 MG tablet Take 1 tablet (20 mg total) by mouth daily.  90 tablet  1  . budesonide-formoterol (SYMBICORT) 160-4.5 MCG/ACT inhaler Inhale 2 puffs into the lungs 2 (two) times daily as needed (for shortness of breath).      Marland Kitchen dexamethasone (DECADRON) 4 MG tablet Take 1 tablet (4 mg total) by mouth 4 (four) times daily.  50 tablet  3  . feeding supplement (ENSURE COMPLETE) LIQD Take 237 mLs by mouth 2 (two) times daily between meals.      . gabapentin (NEURONTIN) 600 MG tablet Take 1 tablet (600 mg total) by mouth every morning.  90 tablet  3  . nortriptyline (PAMELOR) 75 MG capsule TAKE 1 CAPSULE AT BEDTIME  90 capsule  1  . tamsulosin (FLOMAX) 0.4 MG CAPS capsule Take 1 capsule (0.4 mg total) by mouth daily.  90 capsule  3  . acetaminophen (TYLENOL) 500 MG tablet Take 1,000 mg by mouth every 6 (six) hours as needed for pain.       Marland Kitchen LORazepam (ATIVAN) 0.5 MG tablet Take 1 tablet (0.5 mg total) by mouth once. 1 tablet 30 minutes before procedure/treatment  11 tablet  0  . terbinafine (LAMISIL) 250 MG tablet Take 1 tablet (250 mg total) by mouth daily.  30 tablet  5   No current facility-administered medications for this encounter.     REVIEW OF SYSTEMS:  A 15 point review of systems is documented in the electronic medical record. This was obtained by the nursing staff. However, I reviewed this with the patient to discuss relevant findings and make appropriate changes.      PHYSICAL EXAM:  height is 5\' 7"   (1.702 m) and weight is 142 lb (64.411 kg). His oral temperature is 98.1 F (36.7 C). His blood pressure is 144/73 and his pulse is 97. His respiration is 20 and oxygen saturation is 97%.   ECOG = 1  0 - Asymptomatic (Fully active, able to carry on all predisease activities without restriction)  1 - Symptomatic but completely ambulatory (Restricted in physically strenuous activity but ambulatory and able to carry out work of a light or sedentary nature. For example, light housework, office work)  2 - Symptomatic, <50% in bed during the day (Ambulatory and capable of all self care but unable to carry out any work activities. Up and about more than 50% of waking hours)  3 - Symptomatic, >50% in bed, but not bedbound (Capable of only limited self-care, confined to bed or chair 50% or more of waking hours)  4 - Bedbound (Completely disabled. Cannot carry on any self-care. Totally confined to bed or chair)  5 - Death   Eustace Pen MM, Creech RH, Tormey DC, et al. [phone]). "Toxicity and response criteria of  the [facility]". [facility] Oncol. 5 (6): 649-55  General: Well-developed, in no acute distress HEENT: Normocephalic, atraumatic; oral cavity clear Neck: Supple without any lymphadenopathy Cardiovascular: Regular rate and rhythm Respiratory: Clear to auscultation bilaterally GI: Soft, nontender, normal bowel sounds Extremities: No edema present Neuro: No focal deficits; 5 out of 5 strength bilaterally in the upper and lower extremities     LABORATORY DATA:  Lab Results  Component Value Date   WBC 6.7 05/22/2013   HGB 12.0* 05/22/2013   HCT 35.9* 05/22/2013   MCV 94.6 05/22/2013   PLT 292.0 05/22/2013   Lab Results  Component Value Date   NA 132* 05/22/2013   K 3.8 05/22/2013   CL 98 05/22/2013   CO2 27 05/22/2013   Lab Results  Component Value Date   ALT 9 05/22/2013   AST 13 05/22/2013   ALKPHOS 99 05/22/2013   BILITOT 0.7 05/22/2013      RADIOGRAPHY: Ct Head Wo  Contrast  05/22/2013   CLINICAL DATA:  Stage IV lung cancer with unsteady gait.  EXAM: CT HEAD WITHOUT CONTRAST  TECHNIQUE: Contiguous axial images were obtained from the base of the skull through the vertex without intravenous contrast.  COMPARISON:  04/17/2012  FINDINGS: Sinuses/Soft tissues: Left sphenoid sinus mucous retention cyst or polyp. Clear mastoid air cells.  Prior left-sided posterior communicating artery aneurysm.  Intracranial: Multiple intracranial masses, most consistent with metastatic disease. A hypo attenuating lesion in the left frontal lobe measures 2.5 x 2.5 cm on image 27. Dominant lesion within the high right temporal lobe extending into the right parietal lobe measures 4.0 x 4.5 cm on image 15/series 2. This causes early enlargement of the right temporal horn on image 11/series 2. Minimal mass effect, with 5 mm of right-to-left midline shift. Basal cisterns remain patent.  A right cerebellar lesion measures 2.0 cm on image 19/ series 2 and impresses upon the fourth ventricle.  Vertebral atherosclerosis. No complicating infarct, hemorrhage, intra-axial, or extra-axial fluid collection.  IMPRESSION: 1. Development of intracranial metastasis. 2. Dominant right sided cerebral hemisphere lesion causes minimal mass effect and midline shift. 3. Right cerebellar lesion causes mass effect upon the fourth ventricle. No significant proximal hydrocephalus. These results will be called to the ordering clinician or representative by the Radiologist Assistant, and communication documented in the PACS Dashboard.   Electronically Signed   By: Abigail Miyamoto M.D.   On: 05/22/2013 15:32       IMPRESSION: The patient has a history of stage IV non-small cell lung cancer with a new finding of intracranial metastasis on recent CT imaging of the head. An MRI scan of the brain is pending. The patient has been given a prescription for Ativan to help with this as he is claustrophobic. The patient I believe will  benefit from radiation treatment. Based on the appearance of his CT scan, I suspect that whole brain radiation treatment will be most appropriate for the patient. Final determination can be made after reviewing his MRI scan of the brain.  I did also discuss briefly the issue of radiosurgery as well which can be used initially or after a course of whole brain radiation treatment. At this time, I would favor initial whole brain radiation treatment followed by possible radiosurgery dose. Depending on his response in symptoms, surgery could also be entertained for the cerebellar lesion.  I discussed with the patient the role of radiation treatment in the setting of brain metastasis. We discussed the benefit  and rationale of such treatment. We also discussed the possible side effects and risks of treatment as well. All of his questions were answered. The patient is interested in proceeding with radiation treatment.   PLAN: The patient will be scheduled tentatively for simulation for possible whole brain radiation treatment. We will alter this plan if necessary after reviewing his MRI scan. At this time I anticipate 2 weeks of whole brain radiation treatment.       ________________________________   Jodelle Gross, MD, PhD   **Disclaimer: This note was dictated with voice recognition software. Similar sounding words can inadvertently be transcribed and this note may contain transcription errors which may not have been corrected upon publication of note.**

## 2013-05-28 ENCOUNTER — Ambulatory Visit
Admission: RE | Admit: 2013-05-28 | Discharge: 2013-05-28 | Disposition: A | Discharge status: Home / Self Care | Payer: Medicare Other | Source: Ambulatory Visit | Attending: Radiation Oncology | Admitting: Radiation Oncology

## 2013-05-28 DIAGNOSIS — C7949 Secondary malignant neoplasm of other parts of nervous system: Principal | ICD-10-CM

## 2013-05-28 DIAGNOSIS — C7931 Secondary malignant neoplasm of brain: Secondary | ICD-10-CM

## 2013-05-28 NOTE — Progress Notes (Signed)
  Radiation Oncology         (336) [phone] ________________________________  Name: [name] MRN: [id number]  Date: 05/28/2013  DOB: 29-Jan-1950    Simulation and treatment planning note  The patient presented for simulation for the patient's upcoming course of whole brain radiation treatment. The patient was placed in a supine position and a customized thermoplastic head cast was constructed to aid in patient immobilization during the treatment. This complex treatment device will be used on a daily basis. In this fashion a CT scan was obtained through the head and neck region and isocenter was placed near midline within the brain.  The patient will be planned to receive a course of whole brain radiation treatment to a dose of 30 gray in 10 fractions at 3 gray per fraction. To accomplish this, 2 customized blocks have been designed which corresponds to left and right whole brain radiation fields. These 2 complex treatment devices will be used on a daily basis during the course of radiation. A complex isodose plan is requested to insure that the target area is adequately covered in to facilitate optimization of the treatment plan. A forward planning technique will also be evaluated to determine if this approach significantly improves the plan.   ________________________________   Jodelle Gross, MD, PhD

## 2013-05-30 ENCOUNTER — Encounter: Payer: Self-pay | Admitting: *Deleted

## 2013-05-30 NOTE — Progress Notes (Signed)
[facility] Psychosocial Distress Screening  Clinical Social Work  Clinical Social Work was referred by distress screening protocol. The patient scored a 7 on the Psychosocial Distress Thermometer which indicates moderate distress. Clinical Social Worker attempted to contact patient bye phone to assess for distress and other psychosocial needs. CSW unable to reach patient.   ONCBCN DISTRESS SCREENING 05/26/2013  Screening Type Initial Screening  Elta [place] the number that describes how much distress you have been experiencing in the past week 7  Emotional problem type Depression;Nervousness/Anxiety;Adjusting to illness  Physical Problem type Getting around;Breathing  Physician notified of physical symptoms Yes  Referral to clinical social work Yes  Referral to financial advocate Yes    If yes, follow up plan: Left voicemail requesting patient return call, CSW will await call back.   Polo Riley, MSW, LCSW, OSW-C  Clinical Social Worker  [facility]  [phone]

## 2013-06-02 ENCOUNTER — Telehealth: Payer: Self-pay | Admitting: *Deleted

## 2013-06-02 ENCOUNTER — Ambulatory Visit
Admission: RE | Admit: 2013-06-02 | Discharge: 2013-06-02 | Disposition: A | Discharge status: Home / Self Care | Payer: Medicare Other | Source: Ambulatory Visit | Attending: Radiation Oncology | Admitting: Radiation Oncology

## 2013-06-02 DIAGNOSIS — C7931 Secondary malignant neoplasm of brain: Secondary | ICD-10-CM

## 2013-06-02 DIAGNOSIS — C7949 Secondary malignant neoplasm of other parts of nervous system: Principal | ICD-10-CM

## 2013-06-02 NOTE — Telephone Encounter (Signed)
Patient wife called asking to speak with Dr.moody about patient's prognosis, I returned call left voice message saw that he was here today for port and treat linac 3

## 2013-06-02 NOTE — Progress Notes (Signed)
  Radiation Oncology         (336) [phone] ________________________________  Name: [name] MRN: [id number]  Date: 06/02/2013  DOB: 10/13/1950  Simulation Verification Note   NARRATIVE: The patient was brought to the treatment unit and placed in the planned treatment position. The clinical setup was verified. Then port films were obtained and uploaded to the radiation oncology medical record software.  The treatment beams were carefully compared against the planned radiation fields. The position, location, and shape of the radiation fields was reviewed. The targeted volume of tissue appears to be appropriately covered by the radiation beams. Based on my personal review, I approved the simulation verification. The patient's treatment will proceed as planned.  ________________________________   Jodelle Gross, MD, PhD

## 2013-06-03 ENCOUNTER — Ambulatory Visit
Admission: RE | Admit: 2013-06-03 | Discharge: 2013-06-03 | Disposition: A | Discharge status: Home / Self Care | Payer: Medicare Other | Source: Ambulatory Visit | Attending: Radiation Oncology | Admitting: Radiation Oncology

## 2013-06-04 ENCOUNTER — Ambulatory Visit
Admission: RE | Admit: 2013-06-04 | Discharge: 2013-06-04 | Disposition: A | Discharge status: Home / Self Care | Payer: Medicare Other | Source: Ambulatory Visit | Attending: Radiation Oncology | Admitting: Radiation Oncology

## 2013-06-05 ENCOUNTER — Ambulatory Visit
Admission: RE | Admit: 2013-06-05 | Discharge: 2013-06-05 | Disposition: A | Discharge status: Home / Self Care | Payer: Medicare Other | Source: Ambulatory Visit | Attending: Radiation Oncology | Admitting: Radiation Oncology

## 2013-06-06 ENCOUNTER — Encounter: Payer: Self-pay | Admitting: Radiation Oncology

## 2013-06-06 ENCOUNTER — Ambulatory Visit
Admission: RE | Admit: 2013-06-06 | Discharge: 2013-06-06 | Disposition: A | Discharge status: Home / Self Care | Payer: Medicare Other | Source: Ambulatory Visit | Attending: Radiation Oncology | Admitting: Radiation Oncology

## 2013-06-06 VITALS — BP 169/81 | HR 73 | Temp 97.8°F | Resp 20 | Wt 137.7 lb

## 2013-06-06 DIAGNOSIS — C7931 Secondary malignant neoplasm of brain: Secondary | ICD-10-CM

## 2013-06-06 DIAGNOSIS — C7949 Secondary malignant neoplasm of other parts of nervous system: Principal | ICD-10-CM

## 2013-06-06 MED ORDER — NYSTATIN 100000 UNIT/ML MT SUSP: 5.0000 mL | Freq: Four times a day (QID) | OROMUCOSAL | Status: DC

## 2013-06-06 NOTE — Progress Notes (Signed)
   Department of Radiation Oncology  Phone:  [phone] Fax:        [phone]  Weekly Treatment Note    Name: [name] Date: 06/06/2013 MRN: [id number] DOB: 1950/03/23   Current dose: 15 Gy  Current fraction: 5   MEDICATIONS: Current Outpatient Prescriptions  Medication Sig Dispense Refill  . acetaminophen (TYLENOL) 500 MG tablet Take 1,000 mg by mouth every 6 (six) hours as needed for pain.       Marland Kitchen aspirin 81 MG tablet Take 81 mg by mouth daily.      Marland Kitchen atorvastatin (LIPITOR) 20 MG tablet Take 1 tablet (20 mg total) by mouth daily.  90 tablet  1  . budesonide-formoterol (SYMBICORT) 160-4.5 MCG/ACT inhaler Inhale 2 puffs into the lungs 2 (two) times daily as needed (for shortness of breath).      Marland Kitchen dexamethasone (DECADRON) 4 MG tablet Take 1 tablet (4 mg total) by mouth 4 (four) times daily.  50 tablet  3  . feeding supplement (ENSURE COMPLETE) LIQD Take 237 mLs by mouth 2 (two) times daily between meals.      . gabapentin (NEURONTIN) 600 MG tablet Take 1 tablet (600 mg total) by mouth every morning.  90 tablet  3  . LORazepam (ATIVAN) 0.5 MG tablet Take 1 tablet (0.5 mg total) by mouth once. 1 tablet 30 minutes before procedure/treatment  11 tablet  0  . nortriptyline (PAMELOR) 75 MG capsule TAKE 1 CAPSULE AT BEDTIME  90 capsule  1  . tamsulosin (FLOMAX) 0.4 MG CAPS capsule Take 1 capsule (0.4 mg total) by mouth daily.  90 capsule  3  . terbinafine (LAMISIL) 250 MG tablet Take 1 tablet (250 mg total) by mouth daily.  30 tablet  5   No current facility-administered medications for this encounter.     ALLERGIES: Review of patient's allergies indicates no known allergies.   LABORATORY DATA:  Lab Results  Component Value Date   WBC 6.7 05/22/2013   HGB 12.0* 05/22/2013   HCT 35.9* 05/22/2013   MCV 94.6 05/22/2013   PLT 292.0 05/22/2013   Lab Results  Component Value Date   NA 132* 05/22/2013   K 3.8 05/22/2013   CL 98 05/22/2013   CO2 27 05/22/2013   Lab Results    Component Value Date   ALT 9 05/22/2013   AST 13 05/22/2013   ALKPHOS 99 05/22/2013   BILITOT 0.7 05/22/2013     NARRATIVE: [name] was seen today for weekly treatment management. The chart was checked and the patient's films were reviewed. The patient is doing well. No new complaints. No nausea except on a very occasional basis. No headaches. The patient is taking Decadron 4 times per day.  PHYSICAL EXAMINATION: weight is 137 lb 11.2 oz (62.46 kg). His oral temperature is 97.8 F (36.6 C). His blood pressure is 169/81 and his pulse is 73. His respiration is 20.      thrush present in the oral cavity, mild  ASSESSMENT: The patient is doing satisfactorily with treatment.  PLAN: We will continue with the patient's radiation treatment as planned. The patient has been given a prescription for nystatin.

## 2013-06-06 NOTE — Progress Notes (Signed)
Weekly rad txs, 5/10 brain, no pain, no nausea, no vision changes, in w/c, looks like thrush on tongue, patient taking decadron 4mg   4x day, in w/c, weak, enegy level getting some better , patient eduatin reinforced, fatigue, nuasea, skin irritation, pain, increase protein in diet, drink plenty fluids, drinks ta and coffee mostly 3:56 PM

## 2013-06-10 ENCOUNTER — Ambulatory Visit
Admission: RE | Admit: 2013-06-10 | Discharge: 2013-06-10 | Disposition: A | Discharge status: Home / Self Care | Payer: Medicare Other | Source: Ambulatory Visit | Attending: Radiation Oncology | Admitting: Radiation Oncology

## 2013-06-11 ENCOUNTER — Telehealth: Payer: Self-pay | Admitting: *Deleted

## 2013-06-11 ENCOUNTER — Ambulatory Visit
Admission: RE | Admit: 2013-06-11 | Discharge: 2013-06-11 | Disposition: A | Discharge status: Home / Self Care | Payer: Medicare Other | Source: Ambulatory Visit | Attending: Radiation Oncology | Admitting: Radiation Oncology

## 2013-06-11 NOTE — Telephone Encounter (Signed)
Returned call to  Pt wife, from voice message left, left voice message to call back, can increase his ativan to 1 tab every 8 hours per Dr.Moody,  Can call in refill if needed to let me know, Md out of office this afternoon in another clinic 12:02 PM

## 2013-06-11 NOTE — Telephone Encounter (Signed)
Voice message on phone wife requesting something for agitation for mr. Paugh, ,saying he is driving himself and everyone else crazy with his agitation, would like something called to pharmacy a mild rx, and would like to discuss with MD today after rad tx,  will call wife back after discussing with Dr.Moody,

## 2013-06-12 ENCOUNTER — Ambulatory Visit
Admission: RE | Admit: 2013-06-12 | Discharge: 2013-06-12 | Disposition: A | Discharge status: Home / Self Care | Payer: Medicare Other | Source: Ambulatory Visit | Attending: Radiation Oncology | Admitting: Radiation Oncology

## 2013-06-13 ENCOUNTER — Telehealth: Payer: Self-pay | Admitting: *Deleted

## 2013-06-13 ENCOUNTER — Encounter: Payer: Self-pay | Admitting: Radiation Oncology

## 2013-06-13 ENCOUNTER — Ambulatory Visit
Admission: RE | Admit: 2013-06-13 | Discharge: 2013-06-13 | Disposition: A | Discharge status: Home / Self Care | Payer: Medicare Other | Source: Ambulatory Visit | Attending: Radiation Oncology | Admitting: Radiation Oncology

## 2013-06-13 VITALS — BP 143/76 | HR 96 | Temp 97.8°F | Resp 20 | Wt 135.6 lb

## 2013-06-13 DIAGNOSIS — C7949 Secondary malignant neoplasm of other parts of nervous system: Principal | ICD-10-CM

## 2013-06-13 DIAGNOSIS — C7931 Secondary malignant neoplasm of brain: Secondary | ICD-10-CM

## 2013-06-13 NOTE — Telephone Encounter (Signed)
[place] [phone] gave rx for nystatin, 1000,000unit/ml suspension, take 54ml(5000,000 units total) 4xday ,quantity 240ml,  Left voice message, patient and wife aware was called in  3:57 PM

## 2013-06-13 NOTE — Progress Notes (Signed)
   Department of Radiation Oncology  Phone:  [phone] Fax:        [phone]  Weekly Treatment Note    Name: [name] Date: 06/13/2013 MRN: [id number] DOB: 1950/05/10   Current dose: 27 Gy  Current fraction: 30   MEDICATIONS: Current Outpatient Prescriptions  Medication Sig Dispense Refill  . acetaminophen (TYLENOL) 500 MG tablet Take 1,000 mg by mouth every 6 (six) hours as needed for pain.       Marland Kitchen aspirin 81 MG tablet Take 81 mg by mouth daily.      Marland Kitchen atorvastatin (LIPITOR) 20 MG tablet Take 1 tablet (20 mg total) by mouth daily.  90 tablet  1  . budesonide-formoterol (SYMBICORT) 160-4.5 MCG/ACT inhaler Inhale 2 puffs into the lungs 2 (two) times daily as needed (for shortness of breath).      Marland Kitchen dexamethasone (DECADRON) 4 MG tablet Take 1 tablet (4 mg total) by mouth 4 (four) times daily.  50 tablet  3  . feeding supplement (ENSURE COMPLETE) LIQD Take 237 mLs by mouth 2 (two) times daily between meals.      . gabapentin (NEURONTIN) 600 MG tablet Take 1 tablet (600 mg total) by mouth every morning.  90 tablet  3  . LORazepam (ATIVAN) 0.5 MG tablet Take 1 tablet (0.5 mg total) by mouth once. 1 tablet 30 minutes before procedure/treatment  11 tablet  0  . nortriptyline (PAMELOR) 75 MG capsule TAKE 1 CAPSULE AT BEDTIME  90 capsule  1  . nystatin (MYCOSTATIN) 100000 UNIT/ML suspension Take 5 mLs (500,000 Units total) by mouth 4 (four) times daily.  240 mL  0  . tamsulosin (FLOMAX) 0.4 MG CAPS capsule Take 1 capsule (0.4 mg total) by mouth daily.  90 capsule  3  . terbinafine (LAMISIL) 250 MG tablet Take 1 tablet (250 mg total) by mouth daily.  30 tablet  5   No current facility-administered medications for this encounter.     ALLERGIES: Review of patient's allergies indicates no known allergies.   LABORATORY DATA:  Lab Results  Component Value Date   WBC 6.7 05/22/2013   HGB 12.0* 05/22/2013   HCT 35.9* 05/22/2013   MCV 94.6 05/22/2013   PLT 292.0 05/22/2013   Lab  Results  Component Value Date   NA 132* 05/22/2013   K 3.8 05/22/2013   CL 98 05/22/2013   CO2 27 05/22/2013   Lab Results  Component Value Date   ALT 9 05/22/2013   AST 13 05/22/2013   ALKPHOS 99 05/22/2013   BILITOT 0.7 05/22/2013     NARRATIVE: [name] was seen today for weekly treatment management. The chart was checked and the patient's films were reviewed. The patient states he is doing fine with treatment. He continues on Decadron 3 times per day. The patient still has thrush present and his prescription was sent to mail delivery. Nursing has call thrush medication into [facility] for him to begin immediately.  PHYSICAL EXAMINATION: weight is 135 lb 9.6 oz (61.508 kg). His oral temperature is 97.8 F (36.6 C). His blood pressure is 143/76 and his pulse is 96. His respiration is 20.        ASSESSMENT: The patient is doing satisfactorily with treatment.  PLAN: We will continue with the patient's radiation treatment as planned. The patient was given instructions regarding tapering his steroids. He will be seen in one month.

## 2013-06-13 NOTE — Progress Notes (Signed)
Patient 9/10 brain rad tx, no c/o pain or nausea, still has thrush, rx was [facility] not [facility],will call today  rx to [facility], patient has fallen 3x last week, , patient did say he gets dizzy at times, has walker and a cane, in w/c here Fatigued, regular bowel movements, does have frequency,wears depends, is taking his decadron \\3 :48 PM  3:47 PM

## 2013-06-13 NOTE — Progress Notes (Signed)
Patient stated not taking ativan tid"Not helping, 3:53 PM

## 2013-06-16 ENCOUNTER — Other Ambulatory Visit: Payer: Medicare Other

## 2013-06-16 ENCOUNTER — Encounter: Payer: Self-pay | Admitting: Radiation Oncology

## 2013-06-16 ENCOUNTER — Ambulatory Visit
Admission: RE | Admit: 2013-06-16 | Discharge: 2013-06-16 | Disposition: A | Discharge status: Home / Self Care | Payer: Medicare Other | Source: Ambulatory Visit | Attending: Radiation Oncology | Admitting: Radiation Oncology

## 2013-06-18 ENCOUNTER — Ambulatory Visit: Payer: Medicare Other | Admitting: Internal Medicine

## 2013-06-19 ENCOUNTER — Other Ambulatory Visit (HOSPITAL_BASED_OUTPATIENT_CLINIC_OR_DEPARTMENT_OTHER): Payer: Medicare Other

## 2013-06-19 ENCOUNTER — Ambulatory Visit: Payer: Medicare Other | Admitting: Internal Medicine

## 2013-06-19 ENCOUNTER — Telehealth: Payer: Self-pay | Admitting: Internal Medicine

## 2013-06-19 ENCOUNTER — Encounter (HOSPITAL_COMMUNITY): Payer: Self-pay

## 2013-06-19 ENCOUNTER — Ambulatory Visit (HOSPITAL_COMMUNITY)
Admission: RE | Admit: 2013-06-19 | Discharge: 2013-06-19 | Disposition: A | Discharge status: Home / Self Care | Payer: Medicare Other | Source: Ambulatory Visit | Attending: Internal Medicine | Admitting: Internal Medicine

## 2013-06-19 DIAGNOSIS — R05 Cough: Secondary | ICD-10-CM | POA: Insufficient documentation

## 2013-06-19 DIAGNOSIS — C3490 Malignant neoplasm of unspecified part of unspecified bronchus or lung: Secondary | ICD-10-CM | POA: Insufficient documentation

## 2013-06-19 DIAGNOSIS — Z9221 Personal history of antineoplastic chemotherapy: Secondary | ICD-10-CM | POA: Insufficient documentation

## 2013-06-19 DIAGNOSIS — C3410 Malignant neoplasm of upper lobe, unspecified bronchus or lung: Secondary | ICD-10-CM

## 2013-06-19 DIAGNOSIS — Q762 Congenital spondylolisthesis: Secondary | ICD-10-CM | POA: Insufficient documentation

## 2013-06-19 DIAGNOSIS — R918 Other nonspecific abnormal finding of lung field: Secondary | ICD-10-CM | POA: Insufficient documentation

## 2013-06-19 DIAGNOSIS — C7949 Secondary malignant neoplasm of other parts of nervous system: Secondary | ICD-10-CM

## 2013-06-19 DIAGNOSIS — R059 Cough, unspecified: Secondary | ICD-10-CM | POA: Insufficient documentation

## 2013-06-19 DIAGNOSIS — C7931 Secondary malignant neoplasm of brain: Secondary | ICD-10-CM

## 2013-06-19 DIAGNOSIS — C3492 Malignant neoplasm of unspecified part of left bronchus or lung: Secondary | ICD-10-CM

## 2013-06-19 DIAGNOSIS — J438 Other emphysema: Secondary | ICD-10-CM | POA: Insufficient documentation

## 2013-06-19 DIAGNOSIS — R599 Enlarged lymph nodes, unspecified: Secondary | ICD-10-CM | POA: Insufficient documentation

## 2013-06-19 LAB — COMPREHENSIVE METABOLIC PANEL (CC13)
ALBUMIN: 2.8 g/dL — AB (ref 3.5–5.0)
ALK PHOS: 80 U/L (ref 40–150)
ALT: 35 U/L (ref 0–55)
AST: 22 U/L (ref 5–34)
Anion Gap: 12 mEq/L — ABNORMAL HIGH (ref 3–11)
BILIRUBIN TOTAL: 0.63 mg/dL (ref 0.20–1.20)
BUN: 16.1 mg/dL (ref 7.0–26.0)
CO2: 25 meq/L (ref 22–29)
Calcium: 8.8 mg/dL (ref 8.4–10.4)
Chloride: 93 mEq/L — ABNORMAL LOW (ref 98–109)
Creatinine: 0.7 mg/dL (ref 0.7–1.3)
GLUCOSE: 68 mg/dL — AB (ref 70–140)
POTASSIUM: 3.3 meq/L — AB (ref 3.5–5.1)
Sodium: 131 mEq/L — ABNORMAL LOW (ref 136–145)
TOTAL PROTEIN: 6.1 g/dL — AB (ref 6.4–8.3)

## 2013-06-19 LAB — CBC WITH DIFFERENTIAL/PLATELET
BASO%: 0.4 % (ref 0.0–2.0)
Basophils Absolute: 0 10*3/uL (ref 0.0–0.1)
EOS%: 0.7 % (ref 0.0–7.0)
Eosinophils Absolute: 0.1 10*3/uL (ref 0.0–0.5)
HCT: 38.8 % (ref 38.4–49.9)
HGB: 13 g/dL (ref 13.0–17.1)
LYMPH%: 15.6 % (ref 14.0–49.0)
MCH: 31 pg (ref 27.2–33.4)
MCHC: 33.4 g/dL (ref 32.0–36.0)
MCV: 92.8 fL (ref 79.3–98.0)
MONO#: 0.5 10*3/uL (ref 0.1–0.9)
MONO%: 5.9 % (ref 0.0–14.0)
NEUT%: 77.4 % — ABNORMAL HIGH (ref 39.0–75.0)
NEUTROS ABS: 6.7 10*3/uL — AB (ref 1.5–6.5)
PLATELETS: 217 10*3/uL (ref 140–400)
RBC: 4.18 10*6/uL — AB (ref 4.20–5.82)
RDW: 15.5 % — ABNORMAL HIGH (ref 11.0–14.6)
WBC: 8.6 10*3/uL (ref 4.0–10.3)
lymph#: 1.4 10*3/uL (ref 0.9–3.3)

## 2013-06-19 LAB — TECHNOLOGIST REVIEW

## 2013-06-19 MED ORDER — IOHEXOL 300 MG/ML  SOLN
100.0000 mL | Freq: Once | INTRAMUSCULAR | Status: AC | PRN
  Administered 2013-06-19: 100 mL via INTRAVENOUS

## 2013-06-19 NOTE — Telephone Encounter (Signed)
Pt would like a new rx for gabapentin (NEURONTIN) 600 MG tablet tamsulosin (FLOMAX) 0.4 MG CAPS capsule atorvastatin (LIPITOR) 20 MG tablet [facility], but [facility] to [facility] on [address] Pt is out of meds and hope to get before the weekend. 30 day only w/ refills

## 2013-06-20 MED ORDER — TAMSULOSIN HCL 0.4 MG PO CAPS: 0.4000 mg | ORAL_CAPSULE | Freq: Every day | ORAL | Status: DC

## 2013-06-20 MED ORDER — ATORVASTATIN CALCIUM 20 MG PO TABS: 20.0000 mg | ORAL_TABLET | Freq: Every day | ORAL | Status: DC

## 2013-06-20 MED ORDER — GABAPENTIN 600 MG PO TABS: 600.0000 mg | ORAL_TABLET | ORAL | Status: DC

## 2013-06-20 NOTE — Telephone Encounter (Signed)
Left detailed message Rx's sent to [facility] as requested.

## 2013-06-26 ENCOUNTER — Encounter: Payer: Self-pay | Admitting: Internal Medicine

## 2013-06-26 ENCOUNTER — Ambulatory Visit (HOSPITAL_BASED_OUTPATIENT_CLINIC_OR_DEPARTMENT_OTHER): Payer: Medicare Other | Admitting: Internal Medicine

## 2013-06-26 ENCOUNTER — Telehealth: Payer: Self-pay | Admitting: Internal Medicine

## 2013-06-26 VITALS — BP 146/105 | HR 119 | Temp 97.6°F | Resp 18 | Ht 67.0 in | Wt 125.1 lb

## 2013-06-26 DIAGNOSIS — C3410 Malignant neoplasm of upper lobe, unspecified bronchus or lung: Secondary | ICD-10-CM

## 2013-06-26 DIAGNOSIS — C3492 Malignant neoplasm of unspecified part of left bronchus or lung: Secondary | ICD-10-CM

## 2013-06-26 DIAGNOSIS — C7931 Secondary malignant neoplasm of brain: Secondary | ICD-10-CM

## 2013-06-26 DIAGNOSIS — C7949 Secondary malignant neoplasm of other parts of nervous system: Secondary | ICD-10-CM

## 2013-06-26 NOTE — Patient Instructions (Signed)
Smoking Cessation, Tips for Success If you are ready to quit smoking, congratulations! You have chosen to help yourself be healthier. Cigarettes bring nicotine, tar, carbon monoxide, and other irritants into your body. Your lungs, heart, and blood vessels will be able to work better without these poisons. There are many different ways to quit smoking. Nicotine gum, nicotine patches, a nicotine inhaler, or nicotine nasal spray can help with physical craving. Hypnosis, support groups, and medicines help break the habit of smoking. WHAT THINGS CAN I DO TO MAKE QUITTING EASIER?  Here are some tips to help you quit for good:  Pick a date when you will quit smoking completely. Tell all of your friends and family about your plan to quit on that date.  Do not try to slowly cut down on the number of cigarettes you are smoking. Pick a quit date and quit smoking completely starting on that day.  Throw away all cigarettes.   Clean and remove all ashtrays from your home, work, and car.   On a card, write down your reasons for quitting. Carry the card with you and read it when you get the urge to smoke.   Cleanse your body of nicotine. Drink enough water and fluids to keep your urine clear or pale yellow. Do this after quitting to flush the nicotine from your body.   Learn to predict your moods. Do not let a bad situation be your excuse to have a cigarette. Some situations in your life might tempt you into wanting a cigarette.   Never have "just one" cigarette. It leads to wanting another and another. Remind yourself of your decision to quit.   Change habits associated with smoking. If you smoked while driving or when feeling stressed, try other activities to replace smoking. Stand up when drinking your coffee. Brush your teeth after eating. Sit in a different chair when you read the paper. Avoid alcohol while trying to quit, and try to drink fewer caffeinated beverages. Alcohol and caffeine may urge  you to smoke.   Avoid foods and drinks that can trigger a desire to smoke, such as sugary or spicy foods and alcohol.   Ask people who smoke not to smoke around you.   Have something planned to do right after eating or having a cup of coffee. For example, plan to take a walk or exercise.   Try a relaxation exercise to calm you down and decrease your stress. Remember, you may be tense and nervous for the first 2 weeks after you quit, but this will pass.   Find new activities to keep your hands busy. Play with a pen, coin, or rubber band. Doodle or draw things on paper.   Brush your teeth right after eating. This will help cut down on the craving for the taste of tobacco after meals. You can also try mouthwash.   Use oral substitutes in place of cigarettes. Try using lemon drops, carrots, cinnamon sticks, or chewing gum. Keep them handy so they are available when you have the urge to smoke.   When you have the urge to smoke, try deep breathing.   Designate your home as a nonsmoking area.   If you are a heavy smoker, ask your health care provider about a prescription for nicotine chewing gum. It can ease your withdrawal from nicotine.   Reward yourself. Set aside the cigarette money you save and buy yourself something nice.   Look for support from others. Join a support group or   smoking cessation program. Ask someone at home or at work to help you with your plan to quit smoking.   Always ask yourself, "Do I need this cigarette or is this just a reflex?" Tell yourself, "Today, I choose not to smoke," or "I do not want to smoke." You are reminding yourself of your decision to quit.  Do not replace cigarette smoking with electronic cigarettes (commonly called e-cigarettes). The safety of e-cigarettes is unknown, and some may contain harmful chemicals.  If you relapse, do not give up! Plan ahead and think about what you will do the next time you get the urge to smoke.  HOW WILL  I FEEL WHEN I QUIT SMOKING? You may have symptoms of withdrawal because your body is used to nicotine (the addictive substance in cigarettes). You may crave cigarettes, be irritable, feel very hungry, cough often, get headaches, or have difficulty concentrating. The withdrawal symptoms are only temporary. They are strongest when you first quit but will go away within 10 14 days. When withdrawal symptoms occur, stay in control. Think about your reasons for quitting. Remind yourself that these are signs that your body is healing and getting used to being without cigarettes. Remember that withdrawal symptoms are easier to treat than the major diseases that smoking can cause.  Even after the withdrawal is over, expect periodic urges to smoke. However, these cravings are generally short lived and will go away whether you smoke or not. Do not smoke!  WHAT RESOURCES ARE AVAILABLE TO HELP ME QUIT SMOKING? Your health care provider can direct you to community resources or hospitals for support, which may include:  Group support.  Education.  Hypnosis.  Therapy. Document Released: 10/01/2003 Document Revised: 10/23/2012 Document Reviewed: 06/20/2012 [employer] Patient Information 2014 [employer].  

## 2013-06-26 NOTE — Progress Notes (Signed)
[place] Telephone:([phone]) [phone]   Fax:([phone]  OFFICE PROGRESS NOTE  Nyoka Cowden, MD [place] [id number]  DIAGNOSIS: Metastatic non-small cell lung cancer, squamous cell carcinoma diagnosed in March 2014.   PRIOR THERAPY:  1) Systemic chemotherapy with carboplatin for an AUC of 5 given on day 1 and Abraxane at 100 mg per meter squared given on days 1, 8 and 15 every 3 weeks. Status post 4 cycles, as well as day 1 of cycle 5.  2) brain irradiation under the care of Dr. Lisbeth Renshaw for multiple metastatic brain lesions.   CURRENT THERAPY: Observation.   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  CURRENT SMOKING STATUS: Current smoker and was strongly advised to quit smoking and offered smoke cessation materials.  ORAL CHEMOTHERAPY AND CONSENT: None  CURRENT BISPHOSPHONATES USE: None  LIVING WILL AND CODE STATUS: Full code   INTERVAL HISTORY: [name] 63 y.o. male returns to the clinic today for followup visit accompanied by his wife and son. The patient is feeling much better today after completing his course of whole brain irradiation for the metastatic brain lesions. He continues to complain of fatigue and generalized weakness. He also last several pounds recently. He denied having any significant chest pain, shortness of breath, cough or hemoptysis. He has no significant nausea or vomiting, no fever or chills. He has repeat CT scan of the chest, abdomen and pelvis performed recently and he is here for evaluation and discussion of his scan results.  MEDICAL HISTORY: Past Medical History  Diagnosis Date  . BENIGN PROSTATIC HYPERTROPHY 12/24/2009  . CEREBROVASCULAR ACCIDENT, HX OF 12/24/2009    . HYPERLIPIDEMIA 12/24/2009  . HYPERTENSION 12/24/2009  . Testosterone deficiency   . ED (erectile dysfunction)   . Claudication in peripheral vascular disease, lifestyle limiting, with abnormal arterial dopplers of lower ext. 06/30/2011  . S/P angioplasty with stent to Lt. SFA 06/30/11 06/30/2011  . COPD 12/24/2009  . Shortness of breath     "from the COPD" (10/13/2011)  . Stroke 1996    "right side not as strong as left since; numbness R leg/foot" (10/13/2011)  . Pernicious anemia   . Complication of anesthesia     hard to wake up  . Pneumonia   . 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 Brain metastases 05/22/13    ct head     ALLERGIES:  has No Known Allergies.  MEDICATIONS:  Current Outpatient Prescriptions  Medication Sig Dispense Refill  . acetaminophen (TYLENOL) 500 MG tablet Take 1,000 mg by mouth every 6 (six) hours as needed for pain.       Marland Kitchen aspirin 81 MG tablet Take 81 mg by mouth daily.      Marland Kitchen atorvastatin (LIPITOR) 20 MG tablet Take 1 tablet (20 mg total) by mouth daily.  90 tablet  1  . budesonide-formoterol (SYMBICORT) 160-4.5 MCG/ACT inhaler Inhale 2 puffs into the lungs 2 (two) times daily as needed (for shortness of breath).      Marland Kitchen dexamethasone (DECADRON) 4 MG tablet Take 4 mg by mouth 4 (four) times daily. Take a 1/2 pill daily x 1 week, titrating dose      . feeding supplement (ENSURE COMPLETE)  LIQD Take 237 mLs by mouth 2 (two) times daily between meals.      . gabapentin (NEURONTIN) 600 MG tablet Take 1 tablet (600 mg total) by mouth every morning.  90 tablet  1  . LORazepam (ATIVAN) 0.5 MG tablet Take 1 tablet (0.5 mg total) by mouth once. 1 tablet 30 minutes before procedure/treatment  11 tablet  0  . nortriptyline (PAMELOR) 75 MG capsule TAKE 1 CAPSULE AT BEDTIME  90 capsule  1  . tamsulosin (FLOMAX) 0.4 MG CAPS capsule Take 1 capsule (0.4 mg total) by mouth daily.  90 capsule  1  . terbinafine (LAMISIL) 250 MG tablet Take 1 tablet (250 mg  total) by mouth daily.  30 tablet  5   No current facility-administered medications for this visit.    SURGICAL HISTORY:  Past Surgical History  Procedure Laterality Date  . Cervical fusion  1980's    "total of 3-4 OR's on my neck; pinched nerve" (10/13/2011)  . Aneurysm coiling  2007  . Peripheral arterial stent graft  06/30/2010; 10/13/10    left; left  . Video bronchoscopy Bilateral 04/03/2012    Procedure: VIDEO BRONCHOSCOPY WITHOUT FLUORO;  Surgeon: Tanda Rockers, MD;  Location: [facility];  Service: Cardiopulmonary;  Laterality: Bilateral;  . Amputation Left 08/26/2012    Procedure: IRRIGATION AND DEBRIDMENT LEFT DORSAL ABSCESS/ 4TH AND 5TH LEFT RAY AMPUTATION ;  Surgeon: Wylene Simmer, MD;  Location: [facility];  Service: Orthopedics;  Laterality: Left;  . Femoral-popliteal bypass graft Left 09/03/2012    Procedure: BYPASS GRAFT FEMORAL-POPLITEAL ARTERY;  Surgeon: Serafina Mitchell, MD;  Location: [facility];  Service: Vascular;  Laterality: Left;  . Cardiac catheterization    . Colonoscopy      hx: of  . I&d extremity Left 09/26/2012    Procedure: IRRIGATION AND DEBRIDEMENT EXTREMITY- LEFT FOOT WITH WOUND VAC PLACEMENT;  Surgeon: Serafina Mitchell, MD;  Location: [facility];  Service: Vascular;  Laterality: Left;    REVIEW OF SYSTEMS:  Constitutional: positive for fatigue Eyes: negative Ears, nose, mouth, throat, and face: negative Respiratory: positive for dyspnea on exertion Cardiovascular: negative Gastrointestinal: negative Genitourinary:negative Integument/breast: negative Hematologic/lymphatic: negative Musculoskeletal:positive for muscle weakness Neurological: positive for coordination problems, gait problems and weakness Behavioral/Psych: negative Endocrine: negative Allergic/Immunologic: negative   PHYSICAL EXAMINATION: General appearance: alert, cooperative, appears stated age, fatigued and no distress Head: Normocephalic, without obvious abnormality, atraumatic Neck: no  adenopathy, no JVD, supple, symmetrical, trachea midline and thyroid not enlarged, symmetric, no tenderness/mass/nodules Lymph nodes: Cervical, supraclavicular, and axillary nodes normal. Resp: clear to auscultation bilaterally Back: symmetric, no curvature. ROM normal. No CVA tenderness. Cardio: regular rate and rhythm, S1, S2 normal, no murmur, click, rub or gallop GI: soft, non-tender; bowel sounds normal; no masses,  no organomegaly Extremities: edema 1+ left foot Neurologic: Alert and oriented X 3, normal strength and tone. Normal symmetric reflexes. Normal coordination and gait  ECOG PERFORMANCE STATUS: 2 - Symptomatic, <50% confined to bed  Blood pressure 146/105, pulse 119, temperature 97.6 F (36.4 C), temperature source Oral, resp. rate 18, height 5\' 7"  (1.702 m), weight 125 lb 1.6 oz (56.745 kg), SpO2 96.00%.  LABORATORY DATA: Lab Results  Component Value Date   WBC 8.6 06/19/2013   HGB 13.0 06/19/2013   HCT 38.8 06/19/2013   MCV 92.8 06/19/2013   PLT 217 06/19/2013      Chemistry      Component Value Date/Time   NA 131* 06/19/2013 1248   NA 132* 05/22/2013 1315  K 3.3* 06/19/2013 1248   K 3.8 05/22/2013 1315   CL 98 05/22/2013 1315   CL 97* 06/13/2012 1300   CO2 25 06/19/2013 1248   CO2 27 05/22/2013 1315   BUN 16.1 06/19/2013 1248   BUN 12 05/22/2013 1315   CREATININE 0.7 06/19/2013 1248   CREATININE 0.9 05/22/2013 1315   CREATININE 0.65 03/03/2013 0908      Component Value Date/Time   CALCIUM 8.8 06/19/2013 1248   CALCIUM 9.3 05/22/2013 1315   ALKPHOS 80 06/19/2013 1248   ALKPHOS 99 05/22/2013 1315   AST 22 06/19/2013 1248   AST 13 05/22/2013 1315   ALT 35 06/19/2013 1248   ALT 9 05/22/2013 1315   BILITOT 0.63 06/19/2013 1248   BILITOT 0.7 05/22/2013 1315       RADIOGRAPHIC STUDIES: Ct Chest W Contrast  06/19/2013   CLINICAL DATA:  Followup metastatic non-small-cell lung carcinoma. Chemotherapy in progress. Cough.  EXAM: CT CHEST, ABDOMEN, AND PELVIS WITH CONTRAST  TECHNIQUE: Multidetector CT  imaging of the chest, abdomen and pelvis was performed following the standard protocol during bolus administration of intravenous contrast.  CONTRAST:  160mL OMNIPAQUE IOHEXOL 300 MG/ML  SOLN  COMPARISON:  04/14/2013  FINDINGS: CT CHEST FINDINGS  1.3 cm nodule in the left upper lobe on image 19 remains stable. Other tiny sub-cm pulmonary nodules in the left lower lobe also show no significant change in size.  Mild emphysema again noted. New patchy airspace disease is seen in the right middle lobe and inferior aspect of the lingula likely due to drug reaction or atypical infection. No new or enlarging pulmonary nodules or masses are identified.  No evidence of pleural or pericardial effusion. Mild residual mediastinal soft tissue density in the AP window is difficult to measure but remains stable. Precarinal mediastinal lymph node on image 27 remains stable. No pathologically enlarged hilar or mediastinal lymph nodes are identified. Mild left axillary lymphadenopathy is seen with largest lymph node measuring 11 mm compared with 10 mm on previous study. No suspicious bone lesions identified.  CT ABDOMEN AND PELVIS FINDINGS  The liver, spleen, pancreas, adrenal glands, and kidneys are normal in appearance. No evidence hydronephrosis. Cholelithiasis again demonstrated, however there is no evidence of cholecystitis or biliary ductal dilatation.  No soft tissue masses or lymphadenopathy identified within the abdomen or pelvis. No evidence of inflammatory process or abnormal fluid collections. No evidence of bowel wall thickening or dilatation. No suspicious bone lesions identified. Severe lumbar spondylosis again demonstrated as well as bilateral L5 pars defects and grade 2 degenerative anterolisthesis at L5-S1.  IMPRESSION: Stable mild left axillary lymphadenopathy. Stable left upper lobe pulmonary nodules and mild mediastinal soft tissue density in the AP window.  No new or progressive sites of carcinoma identified  within the chest, abdomen, or pelvis.  New mild airspace disease in the right middle lobe and lingula, likely due to drug reaction or atypical infection.   Electronically Signed   By: Earle Gell M.D.   On: 06/19/2013 15:44   Mr Jeri Cos XQ Contrast  05/27/2013   CLINICAL DATA:  Unsteady gait and confusion. Stage IV lung cancer. New brain metastases on recent CT.  EXAM: MRI HEAD WITHOUT AND WITH CONTRAST  TECHNIQUE: Multiplanar, multiecho pulse sequences of the brain and surrounding structures were obtained without and with intravenous contrast.  CONTRAST:  58mL MULTIHANCE GADOBENATE DIMEGLUMINE 529 MG/ML IV SOLN  COMPARISON:  Head CT 05/22/2013  FINDINGS: Postcontrast images are degraded by motion. Multiple supratentorial and  infratentorial brain lesions are present, many of which demonstrate intrinsic T1 hyperintensity and susceptibility artifact consistent with hemorrhage. Many lesions also demonstrate restricted diffusion either diffusely or peripherally. The lesions demonstrate relatively mild enhancement. Given this and the motion artifact on the axial T1 post contrast images, lesions are most easily measured on the precontrast axial T1 images (series 11).  Lesions are as follows: 3.5 x 3.1 cm cystic lesion in the left frontal lobe (series 11, image 65), 2.2 x 2.0 cm in the right frontal lobe (image 61), 3.6 x 2.6 cm in the right parietal lobe (image 51), 5.8 x 3.3 cm in the right temporal lobe (image 34), 2.2 x 1.7 cm in the right occipital lobe (image 30), and 2.3 x 2.2 cm in the medial right cerebellum (image 20). An additional 8 mm lesion is present in the left occipital lobe, best seen on diffusion-weighted images (series 3, image 33).  The large right temporal lobe lesion results in partial effacement of the right lateral ventricle and approximately 3 mm of leftward midline shift. Mild dilatation of the right temporal horn could reflect early hydrocephalus. There is no extra-axial fluid collection. A  remote infarct is noted in the left thalamus. There is also an old infarct in the right pons. Periventricular white matter T2 hyperintensities are nonspecific but compatible with mild chronic small vessel ischemic disease.  Orbits are unremarkable. Small amount of fluid is present in the left sphenoid sinus. Mastoid air cells are noted. Major intracranial vascular flow voids are preserved.  IMPRESSION: Multiple supratentorial and infratentorial brain lesions as above, consistent with metastases. There is mild effacement of right lateral ventricle and minimal leftward midline shift with a possible early trapping of the right temporal horn.   Electronically Signed   By: Logan Bores   On: 05/27/2013 15:34   Ct Abdomen Pelvis W Contrast  06/19/2013   CLINICAL DATA:  Followup metastatic non-small-cell lung carcinoma. Chemotherapy in progress. Cough.  EXAM: CT CHEST, ABDOMEN, AND PELVIS WITH CONTRAST  TECHNIQUE: Multidetector CT imaging of the chest, abdomen and pelvis was performed following the standard protocol during bolus administration of intravenous contrast.  CONTRAST:  15mL OMNIPAQUE IOHEXOL 300 MG/ML  SOLN  COMPARISON:  04/14/2013  FINDINGS: CT CHEST FINDINGS  1.3 cm nodule in the left upper lobe on image 19 remains stable. Other tiny sub-cm pulmonary nodules in the left lower lobe also show no significant change in size.  Mild emphysema again noted. New patchy airspace disease is seen in the right middle lobe and inferior aspect of the lingula likely due to drug reaction or atypical infection. No new or enlarging pulmonary nodules or masses are identified.  No evidence of pleural or pericardial effusion. Mild residual mediastinal soft tissue density in the AP window is difficult to measure but remains stable. Precarinal mediastinal lymph node on image 27 remains stable. No pathologically enlarged hilar or mediastinal lymph nodes are identified. Mild left axillary lymphadenopathy is seen with largest lymph  node measuring 11 mm compared with 10 mm on previous study. No suspicious bone lesions identified.  CT ABDOMEN AND PELVIS FINDINGS  The liver, spleen, pancreas, adrenal glands, and kidneys are normal in appearance. No evidence hydronephrosis. Cholelithiasis again demonstrated, however there is no evidence of cholecystitis or biliary ductal dilatation.  No soft tissue masses or lymphadenopathy identified within the abdomen or pelvis. No evidence of inflammatory process or abnormal fluid collections. No evidence of bowel wall thickening or dilatation. No suspicious bone lesions identified. Severe lumbar  spondylosis again demonstrated as well as bilateral L5 pars defects and grade 2 degenerative anterolisthesis at L5-S1.  IMPRESSION: Stable mild left axillary lymphadenopathy. Stable left upper lobe pulmonary nodules and mild mediastinal soft tissue density in the AP window.  No new or progressive sites of carcinoma identified within the chest, abdomen, or pelvis.  New mild airspace disease in the right middle lobe and lingula, likely due to drug reaction or atypical infection.   Electronically Signed   By: Earle Gell M.D.   On: 06/19/2013 15:44   ASSESSMENT AND PLAN: This is a very pleasant 63 years old white male with metastatic non-small cell lung cancer, squamous cell carcinoma status post treatment with chemotherapy in the form of carboplatin and Abraxane completed 5 cycles.  He was recently diagnosed with multiple metastatic brain lesions and completed a course of whole brain irradiation under the care of Dr. Lisbeth Renshaw. His recent CT scan of the chest, abdomen and pelvis showed no evidence for disease progression. I discussed the scan results with the patient and his family today. I recommended for him to continue on observation with repeat CT scan of the chest, abdomen and pelvis in 3 months. He was advised to call immediately if he has any concerning symptoms in the interval. The patient voices understanding  of current disease status and treatment options and is in agreement with the current care plan.  All questions were answered. The patient knows to call the clinic with any problems, questions or concerns. We can certainly see the patient much sooner if necessary.  Disclaimer: This note was dictated with voice recognition software. Similar sounding words can inadvertently be transcribed and may not be corrected upon review.

## 2013-06-26 NOTE — Telephone Encounter (Signed)
gv adn printed appt sched and avs for pt for SEpt....gv pt barium

## 2013-06-27 ENCOUNTER — Encounter: Payer: Self-pay | Admitting: Physician Assistant

## 2013-06-27 ENCOUNTER — Ambulatory Visit (INDEPENDENT_AMBULATORY_CARE_PROVIDER_SITE_OTHER): Payer: Medicare Other | Admitting: Physician Assistant

## 2013-06-27 VITALS — BP 132/74 | HR 117 | Temp 98.5°F | Resp 18

## 2013-06-27 DIAGNOSIS — L98499 Non-pressure chronic ulcer of skin of other sites with unspecified severity: Secondary | ICD-10-CM

## 2013-06-27 DIAGNOSIS — I739 Peripheral vascular disease, unspecified: Secondary | ICD-10-CM

## 2013-06-27 MED ORDER — AMOXICILLIN-POT CLAVULANATE 875-125 MG PO TABS: 1.0000 | ORAL_TABLET | Freq: Two times a day (BID) | ORAL | Status: DC

## 2013-06-27 NOTE — Progress Notes (Signed)
Subjective:    Patient ID: [name], male    DOB: 07-10-1950, 63 y.o.   MRN: [id number]  HPI Pt is a 63 y/o caucasian male presenting to the clinic for elbow swelling.  Noticed left elbow was red and inflammed yesterday. Was painful yesterday, but not painful today. Not itching. Pt nedies any insect bites, however does admit to having fallen on the area about 2 weeks ago, but did not notice any pain or swelling at that time. Pt used Santyl cream on the area, which seemed to help, maybe made it not as painful. Pt denies F/C/N/V/D.     Review of Systems As per HPI and otherwise negative.    Past Medical History  Diagnosis Date  . BENIGN PROSTATIC HYPERTROPHY 12/24/2009  . CEREBROVASCULAR ACCIDENT, HX OF 12/24/2009  . HYPERLIPIDEMIA 12/24/2009  . HYPERTENSION 12/24/2009  . Testosterone deficiency   . ED (erectile dysfunction)   . Claudication in peripheral vascular disease, lifestyle limiting, with abnormal arterial dopplers of lower ext. 06/30/2011  . S/P angioplasty with stent to Lt. SFA 06/30/11 06/30/2011  . COPD 12/24/2009  . Shortness of breath     "from the COPD" (10/13/2011)  . Stroke 1996    "right side not as strong as left since; numbness R leg/foot" (10/13/2011)  . Pernicious anemia   . Complication of anesthesia     hard to wake up  . Pneumonia   . 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 Brain metastases 05/22/13    ct head    Past Surgical History  Procedure Laterality Date  . Cervical fusion  1980's    "total of 3-4 OR's on my neck; pinched nerve" (10/13/2011)  . Aneurysm coiling  2007  . Peripheral arterial stent graft  06/30/2010; 10/13/10    left; left  . Video bronchoscopy Bilateral 04/03/2012    Procedure: VIDEO BRONCHOSCOPY WITHOUT FLUORO;  Surgeon: Tanda Rockers, MD;  Location: [facility];  Service: Cardiopulmonary;  Laterality: Bilateral;  . Amputation Left 08/26/2012    Procedure: IRRIGATION AND DEBRIDMENT LEFT DORSAL ABSCESS/ 4TH AND  5TH LEFT RAY AMPUTATION ;  Surgeon: Wylene Simmer, MD;  Location: [facility];  Service: Orthopedics;  Laterality: Left;  . Femoral-popliteal bypass graft Left 09/03/2012    Procedure: BYPASS GRAFT FEMORAL-POPLITEAL ARTERY;  Surgeon: Serafina Mitchell, MD;  Location: [facility];  Service: Vascular;  Laterality: Left;  . Cardiac catheterization    . Colonoscopy      hx: of  . I&d extremity Left 09/26/2012    Procedure: IRRIGATION AND DEBRIDEMENT EXTREMITY- LEFT FOOT WITH WOUND VAC PLACEMENT;  Surgeon: Serafina Mitchell, MD;  Location: [facility];  Service: Vascular;  Laterality: Left;    reports that he has been smoking Cigarettes.  He has a 45 pack-year smoking history. He has never used smokeless tobacco. He reports that he does not drink alcohol or use illicit drugs. family history includes Emphysema in his father and paternal uncle; Heart disease in his mother; Lung cancer in his father and paternal grandfather. No Known Allergies     Objective:   Physical Exam  Nursing note and vitals reviewed. Constitutional: He is oriented to person, place, and time. He appears well-developed and well-nourished. No distress.  HENT:  Head: Normocephalic and atraumatic.  Eyes: Conjunctivae and EOM are normal. Pupils are equal, round, and reactive to light.  Neck: Normal range of motion.  Cardiovascular: Normal rate, regular rhythm, normal heart sounds and intact distal  pulses.  Exam reveals no gallop and no friction rub.   No murmur heard. Pulmonary/Chest: Effort normal and breath sounds normal. No respiratory distress. He has no wheezes. He has no rales. He exhibits no tenderness.  Musculoskeletal: Normal range of motion.  Neurological: He is alert and oriented to person, place, and time.  Skin: Skin is warm and dry. No rash noted. He is not diaphoretic. There is erythema. No pallor.  Left elbow: there is a 1 to 2 cm ulceration to the extensor surface. This has a moderate amount of surrounding swelling and erythema, there  is excessive warmth to the touch, difficult to discern fluctuance as the ulcer is draining currently. No TTP. No foreign body.   Psychiatric: He has a normal mood and affect. His behavior is normal. Judgment and thought content normal.    Filed Vitals:   06/27/13 0951  BP: 132/74  Pulse: 117  Temp: 98.5 F (36.9 C)  Resp: 18    Lab Results  Component Value Date   WBC 8.6 06/19/2013   HGB 13.0 06/19/2013   HCT 38.8 06/19/2013   PLT 217 06/19/2013   GLUCOSE 68* 06/19/2013   ALT 35 06/19/2013   AST 22 06/19/2013   NA 131* 06/19/2013   K 3.3* 06/19/2013   CL 98 05/22/2013   CREATININE 0.7 06/19/2013   BUN 16.1 06/19/2013   CO2 25 06/19/2013   TSH 0.466 08/16/2012   INR 1.13 08/26/2012        Assessment & Plan:  [name] was seen today for left elbow laceration.  Diagnoses and associated orders for this visit:  Ulcer of elbow Comments: Cultured due to gangrene history. Broad-spectrum antibiotic coverage. Will keep loosely covered to catch drainage. Follow up Monday with PCP, cx results. - amoxicillin-clavulanate (AUGMENTIN) 875-125 MG per tablet; Take 1 tablet by mouth 2 (two) times daily. - Wound culture    Wound culture results should be available on Monday, and treatment can be tailored as needed.  Pt instructed on proper wound care in the meanwhile.   Return precautions provided.  Plan to follow up the first of next week with PCP to reassess. Follow up sooner for worsening symptoms prior to this appointment.  Patient Instructions  We will culture your wound to see what organisms grow. This will tell us if it is gangrene related or another bacteria.  Augmentin twice daily with food for 10 days.  Keep the area covered with a Telfa pad and loosely wrapped to keep the wound clean and to catch drainage. Clean the wound daily with soap and water. You can apply a small amount of bacitracin ointment to prevent sticking to covering.  Followup on Monday with primary care provider to reassess,  hopefully the culture result will be back by then to further direct care. Followup sooner if symptoms worsen prior to this appointment.

## 2013-06-27 NOTE — Patient Instructions (Signed)
We will culture your wound to see what organisms grow. This will tell us if it is gangrene related or another bacteria.  Augmentin twice daily with food for 10 days.  Keep the area covered with a Telfa pad and loosely wrapped to keep the wound clean and to catch drainage. Clean the wound daily with soap and water. You can apply a small amount of bacitracin ointment to prevent sticking to covering.  Followup on Monday with primary care provider to reassess, hopefully the culture result will be back by then to further direct care. Followup sooner if symptoms worsen prior to this appointment.    Dressing Change A dressing is a material placed over wounds. It keeps the wound clean, dry, and protected from further injury. This provides an environment that favors wound healing.  BEFORE YOU BEGIN  Get your supplies together. Things you may need include:  Saline solution.  Flexible gauze dressing.  Medicated cream.  Tape.  Gloves.  Abdominal dressing pads.  Gauze squares.  Plastic bags.  Take pain medicine 30 minutes before the dressing change if you need it.  Take a shower before you do the first dressing change of the day. Use plastic wrap or a plastic bag to prevent the dressing from getting wet. REMOVING YOUR OLD DRESSING   Wash your hands with soap and water. Dry your hands with a clean towel.  Put on your gloves.  Remove any tape.  Carefully remove the old dressing. If the dressing sticks, you may dampen it with warm water to loosen it, or follow your caregiver's specific directions.  Remove any gauze or packing tape that is in your wound.  Take off your gloves.  Put the gloves, tape, gauze, or any packing tape into a plastic bag. CHANGING YOUR DRESSING  Open the supplies.  Take the cap off the saline solution.  Open the gauze package so that the gauze remains on the inside of the package.  Put on your gloves.  Clean your wound as told by your  caregiver.  If you have been told to keep your wound dry, follow those instructions.  Your caregiver may tell you to do one or more of the following:  Pick up the gauze. Pour the saline solution over the gauze. Squeeze out the extra saline solution.  Put medicated cream or other medicine on your wound if you have been told to do so.  Put the solution soaked gauze only in your wound, not on the skin around it.  Pack your wound loosely or as told by your caregiver.  Put dry gauze on your wound.  Put abdominal dressing pads over the dry gauze if your wet gauze soaks through.  Tape the abdominal dressing pads in place so they will not fall off. Do not wrap the tape completely around the affected part (arm, leg, abdomen).  Wrap the dressing pads with a flexible gauze dressing to secure it in place.  Take off your gloves. Put them in the plastic bag with the old dressing. Tie the bag shut and throw it away.  Keep the dressing clean and dry until your next dressing change.  Wash your hands. SEEK MEDICAL CARE IF:  Your skin around the wound looks red.  Your wound feels more tender or sore.  You see pus in the wound.  Your wound smells bad.  You have a fever.  Your skin around the wound has a rash that itches and burns.  You see black or yellow  skin in your wound that was not there before.  You feel nauseous, throw up, and feel very tired. Document Released: 02/10/2004 Document Revised: 03/27/2011 Document Reviewed: 11/14/2010 [facility] Patient Information 2014 [place], [place].

## 2013-06-27 NOTE — Progress Notes (Signed)
Pre visit review using our clinic review tool, if applicable. No additional management support is needed unless otherwise documented below in the visit note. 

## 2013-06-28 ENCOUNTER — Encounter: Payer: Self-pay | Admitting: Internal Medicine

## 2013-06-30 ENCOUNTER — Telehealth: Payer: Self-pay | Admitting: Infectious Disease

## 2013-06-30 ENCOUNTER — Encounter: Payer: Self-pay | Admitting: Internal Medicine

## 2013-06-30 ENCOUNTER — Ambulatory Visit (INDEPENDENT_AMBULATORY_CARE_PROVIDER_SITE_OTHER): Payer: Medicare Other | Admitting: Internal Medicine

## 2013-06-30 VITALS — BP 152/70 | Temp 98.5°F | Wt 129.0 lb

## 2013-06-30 DIAGNOSIS — R5081 Fever presenting with conditions classified elsewhere: Secondary | ICD-10-CM

## 2013-06-30 DIAGNOSIS — C3492 Malignant neoplasm of unspecified part of left bronchus or lung: Secondary | ICD-10-CM

## 2013-06-30 DIAGNOSIS — R000 Tachycardia, unspecified: Secondary | ICD-10-CM

## 2013-06-30 DIAGNOSIS — C3490 Malignant neoplasm of unspecified part of unspecified bronchus or lung: Secondary | ICD-10-CM

## 2013-06-30 DIAGNOSIS — D709 Neutropenia, unspecified: Secondary | ICD-10-CM

## 2013-06-30 DIAGNOSIS — M7020 Olecranon bursitis, unspecified elbow: Secondary | ICD-10-CM

## 2013-06-30 DIAGNOSIS — I498 Other specified cardiac arrhythmias: Secondary | ICD-10-CM

## 2013-06-30 DIAGNOSIS — I739 Peripheral vascular disease, unspecified: Secondary | ICD-10-CM

## 2013-06-30 DIAGNOSIS — D649 Anemia, unspecified: Secondary | ICD-10-CM

## 2013-06-30 DIAGNOSIS — M869 Osteomyelitis, unspecified: Secondary | ICD-10-CM

## 2013-06-30 DIAGNOSIS — M7022 Olecranon bursitis, left elbow: Secondary | ICD-10-CM

## 2013-06-30 DIAGNOSIS — L98499 Non-pressure chronic ulcer of skin of other sites with unspecified severity: Secondary | ICD-10-CM

## 2013-06-30 DIAGNOSIS — A419 Sepsis, unspecified organism: Secondary | ICD-10-CM

## 2013-06-30 LAB — WOUND CULTURE

## 2013-06-30 MED ORDER — LEVOFLOXACIN 500 MG PO TABS: 500.0000 mg | ORAL_TABLET | Freq: Every day | ORAL | Status: DC

## 2013-06-30 NOTE — Patient Instructions (Addendum)
Take your antibiotic as prescribed until ALL of it is gone, but stop if you develop a rash, swelling, or any side effects of the medication.  Contact our office as soon as possible if  there are side effects of the medication.  X-ray/MRI left elbow as discussed  Followup infectious disease  Call or return to clinic prn if these symptoms worsen or fail to improve as anticipated.

## 2013-06-30 NOTE — Progress Notes (Signed)
Pre visit review using our clinic review tool, if applicable. No additional management support is needed unless otherwise documented below in the visit note. 

## 2013-06-30 NOTE — Telephone Encounter (Signed)
Pt with significant elbow infection. Dr. Raliegh Ip is getting MRI.  Can we plug him in  ID clinic within the week could be with Clinic MD

## 2013-06-30 NOTE — Progress Notes (Signed)
Subjective:    Patient ID: [name], male    DOB: February 12, 1950, 63 y.o.   MRN: [id number]  HPI  63 year old patient who has multiple medical problems including metastatic lung cancer.  He has advanced peripheral vascular disease and history of gangrene and osteomyelitis of the foot.  He is seen in followup today for left olecranon cellulitis with ulceration.  3 days ago.  He was placed on Augmentin.  Subsequent wound cultures have revealed MSSA.  Denies any fever.  He feels the discomfort and inflammatory changes have modestly improved, but there is still some wound drainage from the ulceration.  Past Medical History  Diagnosis Date  . BENIGN PROSTATIC HYPERTROPHY 12/24/2009  . CEREBROVASCULAR ACCIDENT, HX OF 12/24/2009  . HYPERLIPIDEMIA 12/24/2009  . HYPERTENSION 12/24/2009  . Testosterone deficiency   . ED (erectile dysfunction)   . Claudication in peripheral vascular disease, lifestyle limiting, with abnormal arterial dopplers of lower ext. 06/30/2011  . S/P angioplasty with stent to Lt. SFA 06/30/11 06/30/2011  . COPD 12/24/2009  . Shortness of breath     "from the COPD" (10/13/2011)  . Stroke 1996    "right side not as strong as left since; numbness R leg/foot" (10/13/2011)  . Pernicious anemia   . Complication of anesthesia     hard to wake up  . Pneumonia   . 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 Brain metastases 05/22/13    ct head     History   Social History  . Marital Status: Married    Spouse Name: N/A    Number of Children: 3  . Years of Education: N/A   Occupational History  . Retired     Press photographer   Social History Main Topics  . Smoking status: Current Every Day Smoker -- 1.00 packs/day for 45 years    Types: Cigarettes  . Smokeless tobacco: Never Used     Comment: cutting back  . Alcohol Use: No  . Drug Use: No  . Sexual Activity: Not Currently   Other Topics Concern  . Not on file   Social History Narrative  . No narrative on file      Past Surgical History  Procedure Laterality Date  . Cervical fusion  1980's    "total of 3-4 OR's on my neck; pinched nerve" (10/13/2011)  . Aneurysm coiling  2007  . Peripheral arterial stent graft  06/30/2010; 10/13/10    left; left  . Video bronchoscopy Bilateral 04/03/2012    Procedure: VIDEO BRONCHOSCOPY WITHOUT FLUORO;  Surgeon: Tanda Rockers, MD;  Location: [facility];  Service: Cardiopulmonary;  Laterality: Bilateral;  . Amputation Left 08/26/2012    Procedure: IRRIGATION AND DEBRIDMENT LEFT DORSAL ABSCESS/ 4TH AND 5TH LEFT RAY AMPUTATION ;  Surgeon: Wylene Simmer, MD;  Location: [facility];  Service: Orthopedics;  Laterality: Left;  . Femoral-popliteal bypass graft Left 09/03/2012    Procedure: BYPASS GRAFT FEMORAL-POPLITEAL ARTERY;  Surgeon: Serafina Mitchell, MD;  Location: [facility];  Service: Vascular;  Laterality: Left;  . Cardiac catheterization    . Colonoscopy      hx: of  . I&d extremity Left 09/26/2012    Procedure: IRRIGATION AND DEBRIDEMENT EXTREMITY- LEFT FOOT WITH WOUND VAC PLACEMENT;  Surgeon: Serafina Mitchell, MD;  Location: [facility];  Service: Vascular;  Laterality: Left;    Family History  Problem Relation Age of Onset  . Emphysema Father     smoked  . Emphysema Paternal Uncle  smoked  . Heart disease Mother   . Lung cancer Father     smoked  . Lung cancer Paternal Grandfather     smoked    No Known Allergies  Current Outpatient Prescriptions on File Prior to Visit  Medication Sig Dispense Refill  . acetaminophen (TYLENOL) 500 MG tablet Take 1,000 mg by mouth every 6 (six) hours as needed for pain.       Marland Kitchen aspirin 81 MG tablet Take 81 mg by mouth daily.      Marland Kitchen atorvastatin (LIPITOR) 20 MG tablet Take 1 tablet (20 mg total) by mouth daily.  90 tablet  1  . budesonide-formoterol (SYMBICORT) 160-4.5 MCG/ACT inhaler Inhale 2 puffs into the lungs 2 (two) times daily as needed (for shortness of breath).      Marland Kitchen dexamethasone (DECADRON) 4 MG tablet Take 4 mg by mouth  4 (four) times daily. Take a 1/2 pill daily x 1 week, titrating dose      . gabapentin (NEURONTIN) 600 MG tablet Take 1 tablet (600 mg total) by mouth every morning.  90 tablet  1  . LORazepam (ATIVAN) 0.5 MG tablet Take 1 tablet (0.5 mg total) by mouth once. 1 tablet 30 minutes before procedure/treatment  11 tablet  0  . nortriptyline (PAMELOR) 75 MG capsule TAKE 1 CAPSULE AT BEDTIME  90 capsule  1  . tamsulosin (FLOMAX) 0.4 MG CAPS capsule Take 1 capsule (0.4 mg total) by mouth daily.  90 capsule  1   No current facility-administered medications on file prior to visit.    BP 152/70  Temp(Src) 98.5 F (36.9 C) (Oral)  Wt 129 lb (58.514 kg)        Review of Systems  Constitutional: Positive for fatigue. Negative for fever, chills and appetite change.  HENT: Negative for congestion, dental problem, ear pain, hearing loss, sore throat, tinnitus, trouble swallowing and voice change.   Eyes: Negative for pain, discharge and visual disturbance.  Respiratory: Negative for cough, chest tightness, wheezing and stridor.   Cardiovascular: Negative for chest pain, palpitations and leg swelling.  Gastrointestinal: Negative for nausea, vomiting, abdominal pain, diarrhea, constipation, blood in stool and abdominal distention.  Genitourinary: Negative for urgency, hematuria, flank pain, discharge, difficulty urinating and genital sores.  Musculoskeletal: Negative for arthralgias, back pain, gait problem, joint swelling, myalgias and neck stiffness.  Skin: Positive for rash and wound.  Neurological: Negative for dizziness, syncope, speech difficulty, weakness, numbness and headaches.  Hematological: Negative for adenopathy. Does not bruise/bleed easily.  Psychiatric/Behavioral: Negative for behavioral problems and dysphoric mood. The patient is not nervous/anxious.        Objective:   Physical Exam  Constitutional: He appears well-developed and well-nourished. No distress.  Weak Sitting in  wheelchair Temperature 98.4  Skin:  12 mm full-thickness ulcer over the left olecranon. Olecranon swelling, but nonfluctuant. Serosanguineous drainage from the ulcer No significant tenderness Moderate erythema          Assessment & Plan:   Olecranon ulcer/cellulitis .  We'll continue Augmentin.  We'll check a MRI of the left elbow to exclude osteomyelitis or other pathology.  Discussed with ID, who will followup in clinic. Metastatic lung cancer.  Followup oncology Cardiovascular disease Peripheral vascular disease

## 2013-07-03 ENCOUNTER — Telehealth: Payer: Self-pay | Admitting: *Deleted

## 2013-07-03 ENCOUNTER — Ambulatory Visit (INDEPENDENT_AMBULATORY_CARE_PROVIDER_SITE_OTHER): Payer: Medicare Other | Admitting: Internal Medicine

## 2013-07-03 ENCOUNTER — Encounter: Payer: Self-pay | Admitting: Internal Medicine

## 2013-07-03 VITALS — BP 159/83 | HR 111 | Temp 98.0°F | Ht 67.0 in | Wt 130.5 lb

## 2013-07-03 DIAGNOSIS — L98499 Non-pressure chronic ulcer of skin of other sites with unspecified severity: Secondary | ICD-10-CM

## 2013-07-03 DIAGNOSIS — I739 Peripheral vascular disease, unspecified: Secondary | ICD-10-CM

## 2013-07-03 DIAGNOSIS — M7020 Olecranon bursitis, unspecified elbow: Secondary | ICD-10-CM

## 2013-07-03 DIAGNOSIS — M7022 Olecranon bursitis, left elbow: Secondary | ICD-10-CM | POA: Insufficient documentation

## 2013-07-03 MED ORDER — CEPHALEXIN 500 MG PO CAPS: 500.0000 mg | ORAL_CAPSULE | Freq: Four times a day (QID) | ORAL | Status: DC

## 2013-07-03 NOTE — Progress Notes (Signed)
Patient ID: [name], male   DOB: 10-29-1950, 63 y.o.   MRN: [id number]         [facility]  Patient Active Problem List   Diagnosis Date Noted  . Olecranon bursitis of left elbow 07/03/2013    Priority: High  . Secondary malignant neoplasm of brain and spinal cord 05/27/2013  . Non-small cell carcinoma of left lung, stage 4 04/16/2013  . Onychomycosis 03/03/2013  . Osteomyelitis of foot 10/16/2012  . Atherosclerosis of native arteries of the extremities with ulceration(440.23) 09/23/2012  . 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 09/01/2012  . Thrombocytopenia-(new this admission) 08/26/2012  . 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 PVD, s/p Rt SFA PTA/ stenting 10/13/11 10/14/2011  . S/P angioplasty with stent to Lt. SFA 06/30/11 (for ISR from 9/12) 06/30/2011  . Tobacco abuse 10/31/2010  . HYPERLIPIDEMIA 12/24/2009  . HYPERTENSION 12/24/2009  . COPD 12/24/2009  . BENIGN PROSTATIC HYPERTROPHY 12/24/2009  . CEREBROVASCULAR ACCIDENT, HX OF 12/24/2009    Patient's Medications  New Prescriptions   No medications on file  Previous Medications   ACETAMINOPHEN (TYLENOL) 500 MG TABLET    Take 1,000 mg by mouth every 6 (six) hours as needed for pain.    ASPIRIN 81 MG TABLET    Take 81 mg by mouth daily.   ATORVASTATIN (LIPITOR) 20 MG TABLET    Take 1 tablet (20 mg total) by mouth daily.   BUDESONIDE-FORMOTEROL (SYMBICORT) 160-4.5 MCG/ACT INHALER    Inhale 2 puffs into the lungs 2 (two) times daily as needed (for shortness of breath).   GABAPENTIN (NEURONTIN) 600 MG TABLET    Take 1 tablet (600 mg total) by mouth every morning.   LORAZEPAM (ATIVAN) 0.5 MG TABLET    Take 1 tablet (0.5 mg total) by mouth once. 1 tablet 30 minutes before procedure/treatment   NORTRIPTYLINE (PAMELOR) 75 MG CAPSULE    TAKE 1 CAPSULE AT BEDTIME   TAMSULOSIN (FLOMAX) 0.4 MG CAPS CAPSULE    Take 1 capsule (0.4 mg total) by mouth daily.  Modified  Medications   No medications on file  Discontinued Medications   AMOXICILLIN-CLAVULANATE (AUGMENTIN) 875-125 MG PER TABLET    Take 1 tablet by mouth 2 (two) times daily.   DEXAMETHASONE (DECADRON) 4 MG TABLET    Take 4 mg by mouth 4 (four) times daily. Take a 1/2 pill daily x 1 week, titrating dose   LEVOFLOXACIN (LEVAQUIN) 500 MG TABLET    Take 1 tablet (500 mg total) by mouth daily.    Subjective: [name] is seeing a work in basis. He has been followed by my partner, Dr. Drucilla Schmidt, in the past for a previous left foot infection. He recently noticed some swelling over his left elbow with a small area of central ulceration. He was seen at his primary care physician's office on June 12. A swab culture of drainage from the ulcer grew MSSA. He was started on Augmentin. He denies having any pain. He denies having any fever or chills. He has not noticed any improvement in the swelling, drainage or ulceration since starting Augmentin. He is scheduled for an MRI in 3 days. He has recently received chemotherapy and radiation therapy for non-small cell lung cancer with brain metastases. He has been on dexamethasone and took his last dose this morning. He is unsure if he has had any injury to his left elbow but he has had multiple recent falls. He does have  a habit of resting on his left elbow.  Review of Systems: Pertinent items are noted in HPI.  Past Medical History  Diagnosis Date  . BENIGN PROSTATIC HYPERTROPHY 12/24/2009  . CEREBROVASCULAR ACCIDENT, HX OF 12/24/2009  . HYPERLIPIDEMIA 12/24/2009  . HYPERTENSION 12/24/2009  . Testosterone deficiency   . ED (erectile dysfunction)   . Claudication in peripheral vascular disease, lifestyle limiting, with abnormal arterial dopplers of lower ext. 06/30/2011  . S/P angioplasty with stent to Lt. SFA 06/30/11 06/30/2011  . COPD 12/24/2009  . Shortness of breath     "from the COPD" (10/13/2011)  . Stroke 1996    "right side not as strong as left since; numbness  R leg/foot" (10/13/2011)  . Pernicious anemia   . Complication of anesthesia     hard to wake up  . Pneumonia   . 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 Brain metastases 05/22/13    ct head     History  Substance Use Topics  . Smoking status: Current Every Day Smoker -- 1.00 packs/day for 45 years    Types: Cigarettes  . Smokeless tobacco: Never Used     Comment: cutting back  . Alcohol Use: No    Family History  Problem Relation Age of Onset  . Emphysema Father     smoked  . Emphysema Paternal Uncle     smoked  . Heart disease Mother   . Lung cancer Father     smoked  . Lung cancer Paternal Grandfather     smoked    No Known Allergies  Objective: Temp: 98 F (36.7 C) (06/18 1000) Temp src: Oral (06/18 1000) BP: 159/83 mmHg (06/18 1000) Pulse Rate: 111 (06/18 1000)  General: He is in no distress seated in a wheelchair Skin: Dry, flaking skin on her scalp and for head. Multiple ecchymoses on arms some with scabs. Lungs: Scattered rhonchi and wheezes Cor: Distant heart sounds Abdomen: Soft and nontender Joints and extremities: Diffuse swelling of the left olecranon bursa with a dime-sized area of central ulceration. The ulcer looks fairly chronic. There is thin yellow drainage on his gauze dressing. There is minimal surrounding erythema. He has no pain with palpation or range of motion of his elbow    Assessment: He has left olecranon septic bursitis. MSSA as the probable culprit. I suspect he may have had an ulcer over the tip of the elbow that is more chronic than he is aware of. I am not sure that antibiotics alone will cure this. I will obtain an orthopedic evaluation.  Plan: 1. Narrow antibiotic therapy to oral cephalexin 2. Orthopedic evaluation 3. Follow up in 2 weeks   Frank Bickers, MD [facility] [place] [phone] pager   [phone] cell 07/03/2013, 10:23 AM

## 2013-07-03 NOTE — Telephone Encounter (Signed)
Appt made w/ Dr. Apolonio Schneiders for 06/02/13 @ 3:00PM per Dr. Megan Salon.

## 2013-07-06 ENCOUNTER — Inpatient Hospital Stay: Admission: RE | Admit: 2013-07-06 | Payer: Medicare Other | Source: Ambulatory Visit

## 2013-07-09 ENCOUNTER — Encounter: Payer: Self-pay | Admitting: Radiation Oncology

## 2013-07-09 NOTE — Progress Notes (Signed)
  Radiation Oncology         [phone] ________________________________  Name: [name] MRN: [id number]  Date: 06/16/2013  DOB: [birth year]  End of Treatment Note  Diagnosis:   Metastatic lung cancer with brain metastasis     Indication for treatment:  Palliative       Radiation treatment dates:   06/02/2013 through 06/16/2013  Site/dose:   The patient was treated with a course of whole brain radiation. He received 30 gray in 10 fractions at 3 gray per fraction.  Narrative: The patient tolerated radiation treatment relatively well.   The patient did well during treatment and did not have substantial difficulties with acute toxicity. No significant skin irritation at the end of treatment.  Plan: The patient has completed radiation treatment. The patient will return to radiation oncology clinic for routine followup in one month. I advised the patient to call or return sooner if they have any questions or concerns related to their recovery or treatment. ________________________________  Jodelle Gross, M.D., Ph.D.

## 2013-07-10 ENCOUNTER — Encounter: Payer: Self-pay | Admitting: Radiation Oncology

## 2013-07-10 ENCOUNTER — Ambulatory Visit
Admission: RE | Admit: 2013-07-10 | Discharge: 2013-07-10 | Disposition: A | Discharge status: Home / Self Care | Payer: Medicare Other | Source: Ambulatory Visit | Attending: Radiation Oncology | Admitting: Radiation Oncology

## 2013-07-10 VITALS — BP 136/69 | HR 113 | Temp 98.0°F | Resp 20 | Ht 67.0 in | Wt 124.6 lb

## 2013-07-10 DIAGNOSIS — C7949 Secondary malignant neoplasm of other parts of nervous system: Principal | ICD-10-CM

## 2013-07-10 DIAGNOSIS — C7931 Secondary malignant neoplasm of brain: Secondary | ICD-10-CM

## 2013-07-10 HISTORY — DX: Personal history of irradiation: Z92.3

## 2013-07-10 NOTE — Progress Notes (Signed)
Radiation Oncology         [phone] ________________________________  Name: [name] MRN: [id number]  Date: 07/10/2013  DOB: 1950-05-17  Follow-Up Visit Note  CC: Nyoka Cowden, MD  Curt Bears, MD  Diagnosis:   Metastatic lung cancer  Interval Since Last Radiation:  One month   Narrative:  The patient returns today for routine follow-up.  The patient states he has done relatively well since he finished treatment. He has been able to taper his steroids and he denies problems with this. No significant/severe headaches. No nausea. No vision changes although he does complain of some dry eyes. He is using eyedrops over-the-counter. Some occasional sore throat but this has improved.                              ALLERGIES:  has No Known Allergies.  Meds: Current Outpatient Prescriptions  Medication Sig Dispense Refill  . acetaminophen (TYLENOL) 500 MG tablet Take 1,000 mg by mouth every 6 (six) hours as needed for pain.       Marland Kitchen aspirin 81 MG tablet Take 81 mg by mouth daily.      Marland Kitchen atorvastatin (LIPITOR) 20 MG tablet Take 1 tablet (20 mg total) by mouth daily.  90 tablet  1  . budesonide-formoterol (SYMBICORT) 160-4.5 MCG/ACT inhaler Inhale 2 puffs into the lungs 2 (two) times daily as needed (for shortness of breath).      . cephALEXin (KEFLEX) 500 MG capsule Take 1 capsule (500 mg total) by mouth 4 (four) times daily.  120 capsule  1  . gabapentin (NEURONTIN) 600 MG tablet Take 1 tablet (600 mg total) by mouth every morning.  90 tablet  1  . nortriptyline (PAMELOR) 75 MG capsule TAKE 1 CAPSULE AT BEDTIME  90 capsule  1  . tamsulosin (FLOMAX) 0.4 MG CAPS capsule Take 1 capsule (0.4 mg total) by mouth daily.  90 capsule  1  . LORazepam (ATIVAN) 0.5 MG tablet Take 1 tablet (0.5 mg total) by mouth once. 1 tablet 30 minutes before procedure/treatment  11 tablet  0   No current facility-administered medications for this encounter.    Physical Findings: The patient  is in no acute distress. Patient is alert and oriented.  height is 5\' 7"  (1.702 m) and weight is 124 lb 9.6 oz (56.518 kg). His oral temperature is 98 F (36.7 C). His blood pressure is 136/69 and his pulse is 113. His respiration is 20 and oxygen saturation is 99%. .   The patient has some skin irritation with dryness but this has been healing satisfactorily. Oral cavity clear with no thrush present.  Lab Findings: Lab Results  Component Value Date   WBC 8.6 06/19/2013   HGB 13.0 06/19/2013   HCT 38.8 06/19/2013   MCV 92.8 06/19/2013   PLT 217 06/19/2013     Radiographic Findings: Ct Chest W Contrast  06/19/2013   CLINICAL DATA:  Followup metastatic non-small-cell lung carcinoma. Chemotherapy in progress. Cough.  EXAM: CT CHEST, ABDOMEN, AND PELVIS WITH CONTRAST  TECHNIQUE: Multidetector CT imaging of the chest, abdomen and pelvis was performed following the standard protocol during bolus administration of intravenous contrast.  CONTRAST:  14mL OMNIPAQUE IOHEXOL 300 MG/ML  SOLN  COMPARISON:  04/14/2013  FINDINGS: CT CHEST FINDINGS  1.3 cm nodule in the left upper lobe on image 19 remains stable. Other tiny sub-cm pulmonary nodules in the left lower lobe also show no significant  change in size.  Mild emphysema again noted. New patchy airspace disease is seen in the right middle lobe and inferior aspect of the lingula likely due to drug reaction or atypical infection. No new or enlarging pulmonary nodules or masses are identified.  No evidence of pleural or pericardial effusion. Mild residual mediastinal soft tissue density in the AP window is difficult to measure but remains stable. Precarinal mediastinal lymph node on image 27 remains stable. No pathologically enlarged hilar or mediastinal lymph nodes are identified. Mild left axillary lymphadenopathy is seen with largest lymph node measuring 11 mm compared with 10 mm on previous study. No suspicious bone lesions identified.  CT ABDOMEN AND PELVIS FINDINGS   The liver, spleen, pancreas, adrenal glands, and kidneys are normal in appearance. No evidence hydronephrosis. Cholelithiasis again demonstrated, however there is no evidence of cholecystitis or biliary ductal dilatation.  No soft tissue masses or lymphadenopathy identified within the abdomen or pelvis. No evidence of inflammatory process or abnormal fluid collections. No evidence of bowel wall thickening or dilatation. No suspicious bone lesions identified. Severe lumbar spondylosis again demonstrated as well as bilateral L5 pars defects and grade 2 degenerative anterolisthesis at L5-S1.  IMPRESSION: Stable mild left axillary lymphadenopathy. Stable left upper lobe pulmonary nodules and mild mediastinal soft tissue density in the AP window.  No new or progressive sites of carcinoma identified within the chest, abdomen, or pelvis.  New mild airspace disease in the right middle lobe and lingula, likely due to drug reaction or atypical infection.   Electronically Signed   By: Earle Gell M.D.   On: 06/19/2013 15:44   Ct Abdomen Pelvis W Contrast  06/19/2013   CLINICAL DATA:  Followup metastatic non-small-cell lung carcinoma. Chemotherapy in progress. Cough.  EXAM: CT CHEST, ABDOMEN, AND PELVIS WITH CONTRAST  TECHNIQUE: Multidetector CT imaging of the chest, abdomen and pelvis was performed following the standard protocol during bolus administration of intravenous contrast.  CONTRAST:  182mL OMNIPAQUE IOHEXOL 300 MG/ML  SOLN  COMPARISON:  04/14/2013  FINDINGS: CT CHEST FINDINGS  1.3 cm nodule in the left upper lobe on image 19 remains stable. Other tiny sub-cm pulmonary nodules in the left lower lobe also show no significant change in size.  Mild emphysema again noted. New patchy airspace disease is seen in the right middle lobe and inferior aspect of the lingula likely due to drug reaction or atypical infection. No new or enlarging pulmonary nodules or masses are identified.  No evidence of pleural or pericardial  effusion. Mild residual mediastinal soft tissue density in the AP window is difficult to measure but remains stable. Precarinal mediastinal lymph node on image 27 remains stable. No pathologically enlarged hilar or mediastinal lymph nodes are identified. Mild left axillary lymphadenopathy is seen with largest lymph node measuring 11 mm compared with 10 mm on previous study. No suspicious bone lesions identified.  CT ABDOMEN AND PELVIS FINDINGS  The liver, spleen, pancreas, adrenal glands, and kidneys are normal in appearance. No evidence hydronephrosis. Cholelithiasis again demonstrated, however there is no evidence of cholecystitis or biliary ductal dilatation.  No soft tissue masses or lymphadenopathy identified within the abdomen or pelvis. No evidence of inflammatory process or abnormal fluid collections. No evidence of bowel wall thickening or dilatation. No suspicious bone lesions identified. Severe lumbar spondylosis again demonstrated as well as bilateral L5 pars defects and grade 2 degenerative anterolisthesis at L5-S1.  IMPRESSION: Stable mild left axillary lymphadenopathy. Stable left upper lobe pulmonary nodules and mild mediastinal soft tissue  density in the AP window.  No new or progressive sites of carcinoma identified within the chest, abdomen, or pelvis.  New mild airspace disease in the right middle lobe and lingula, likely due to drug reaction or atypical infection.   Electronically Signed   By: Earle Gell M.D.   On: 06/19/2013 15:44    Impression/plan:    The patient is doing satisfactorily one month after his course of radiation treatment. He completed whole brain radiation treatment and we will schedule him for an MRI scan of the brain in 2 months and follow him through the brain program. I will see him after his scan has been reviewed in conference.    Jodelle Gross, M.D., Ph.D.

## 2013-07-10 NOTE — Progress Notes (Addendum)
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

## 2013-07-22 ENCOUNTER — Ambulatory Visit (INDEPENDENT_AMBULATORY_CARE_PROVIDER_SITE_OTHER): Payer: Medicare Other | Admitting: Internal Medicine

## 2013-07-22 ENCOUNTER — Other Ambulatory Visit: Payer: Self-pay | Admitting: Radiation Therapy

## 2013-07-22 ENCOUNTER — Encounter: Payer: Self-pay | Admitting: Internal Medicine

## 2013-07-22 VITALS — BP 158/75 | HR 96 | Temp 97.9°F | Wt 124.5 lb

## 2013-07-22 DIAGNOSIS — L98499 Non-pressure chronic ulcer of skin of other sites with unspecified severity: Secondary | ICD-10-CM

## 2013-07-22 DIAGNOSIS — M7022 Olecranon bursitis, left elbow: Secondary | ICD-10-CM

## 2013-07-22 DIAGNOSIS — C7949 Secondary malignant neoplasm of other parts of nervous system: Principal | ICD-10-CM

## 2013-07-22 DIAGNOSIS — C7931 Secondary malignant neoplasm of brain: Secondary | ICD-10-CM

## 2013-07-22 DIAGNOSIS — I739 Peripheral vascular disease, unspecified: Secondary | ICD-10-CM

## 2013-07-22 DIAGNOSIS — M7020 Olecranon bursitis, unspecified elbow: Secondary | ICD-10-CM

## 2013-07-22 NOTE — Progress Notes (Signed)
Patient ID: BLANCHE GALLIEN, male   DOB: Aug 21, 1950, 63 y.o.   MRN: [id number]         [facility]  Patient Active Problem List   Diagnosis Date Noted  . Olecranon bursitis of left elbow 07/03/2013    Priority: High  . Secondary malignant neoplasm of brain and spinal cord 05/27/2013  . Non-small cell carcinoma of left lung, stage 4 04/16/2013  . Onychomycosis 03/03/2013  . Osteomyelitis of foot 10/16/2012  . Atherosclerosis of native arteries of the extremities with ulceration(440.23) 09/23/2012  . 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 09/01/2012  . Thrombocytopenia-(new this admission) 08/26/2012  . 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 PVD, s/p Rt SFA PTA/ stenting 10/13/11 10/14/2011  . S/P angioplasty with stent to Lt. SFA 06/30/11 (for ISR from 9/12) 06/30/2011  . Tobacco abuse 10/31/2010  . HYPERLIPIDEMIA 12/24/2009  . HYPERTENSION 12/24/2009  . COPD 12/24/2009  . BENIGN PROSTATIC HYPERTROPHY 12/24/2009  . CEREBROVASCULAR ACCIDENT, HX OF 12/24/2009    Patient's Medications  New Prescriptions   No medications on file  Previous Medications   ACETAMINOPHEN (TYLENOL) 500 MG TABLET    Take 1,000 mg by mouth every 6 (six) hours as needed for pain.    ASPIRIN 81 MG TABLET    Take 81 mg by mouth daily.   ATORVASTATIN (LIPITOR) 20 MG TABLET    Take 1 tablet (20 mg total) by mouth daily.   BUDESONIDE-FORMOTEROL (SYMBICORT) 160-4.5 MCG/ACT INHALER    Inhale 2 puffs into the lungs 2 (two) times daily as needed (for shortness of breath).   CEPHALEXIN (KEFLEX) 500 MG CAPSULE    Take 1 capsule (500 mg total) by mouth 4 (four) times daily.   GABAPENTIN (NEURONTIN) 600 MG TABLET    Take 1 tablet (600 mg total) by mouth every morning.   LORAZEPAM (ATIVAN) 0.5 MG TABLET    Take 1 tablet (0.5 mg total) by mouth once. 1 tablet 30 minutes before procedure/treatment   NORTRIPTYLINE (PAMELOR) 75 MG CAPSULE    TAKE 1 CAPSULE AT BEDTIME     TAMSULOSIN (FLOMAX) 0.4 MG CAPS CAPSULE    Take 1 capsule (0.4 mg total) by mouth daily.  Modified Medications   No medications on file  Discontinued Medications   No medications on file    Subjective: Mr. Allnutt is in for his routine followup visit. He is now going to the wound center twice weekly. He is getting whirlpool therapy in having the left elbow wound packed. He is tolerating his cephalexin well.  Review of Systems: Pertinent items are noted in HPI.  Past Medical History  Diagnosis Date  . BENIGN PROSTATIC HYPERTROPHY 12/24/2009  . CEREBROVASCULAR ACCIDENT, HX OF 12/24/2009  . HYPERLIPIDEMIA 12/24/2009  . HYPERTENSION 12/24/2009  . Testosterone deficiency   . ED (erectile dysfunction)   . Claudication in peripheral vascular disease, lifestyle limiting, with abnormal arterial dopplers of lower ext. 06/30/2011  . S/P angioplasty with stent to Lt. SFA 06/30/11 06/30/2011  . COPD 12/24/2009  . Shortness of breath     "from the COPD" (10/13/2011)  . Stroke 1996    "right side not as strong as left since; numbness R leg/foot" (10/13/2011)  . Pernicious anemia   . Complication of anesthesia     hard to wake up  . Pneumonia   . 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 Brain metastases 05/22/13    ct head   .  History of radiation therapy 06/02/13-06/16/13    brain    History  Substance Use Topics  . Smoking status: Current Every Day Smoker -- 0.75 packs/day for 45 years    Types: Cigarettes  . Smokeless tobacco: Never Used     Comment: cutting back  . Alcohol Use: No    Family History  Problem Relation Age of Onset  . Emphysema Father     smoked  . Emphysema Paternal Uncle     smoked  . Heart disease Mother   . Lung cancer Father     smoked  . Lung cancer Paternal Grandfather     smoked    No Known Allergies  Objective:    General: He is in no distress seated in a wheelchair Skin: Dry, flaking skin on her scalp and for head. Multiple  ecchymoses on arms some with scabs. Lungs: Scattered rhonchi and wheezes Cor: Distant heart sounds Abdomen: Soft and nontender Joints and extremities: Diffuse swelling of the left olecranon bursa. The central ulceration has decreased in size. The ulcer looks fairly chronic. There is thin yellow drainage on his gauze dressing. There is minimal surrounding erythema. He has no pain with palpation or range of motion of his elbow    Assessment: He has left olecranon septic bursitis. The area is open and draining. There is no underlying osteomyelitis. I will have him complete his current course of cephalexin and then stop. The most important therapy will be continued whirlpool treatments and packing of the wound to help it heal from the bottom up  Plan: 1. Complete 2 more weeks of cephalexin 2. Followup in one month   Michel Bickers, MD [facility] [place] [phone] pager   [phone] cell 07/22/2013, 9:01 AM

## 2013-07-29 ENCOUNTER — Telehealth: Payer: Self-pay | Admitting: Internal Medicine

## 2013-07-29 MED ORDER — NORTRIPTYLINE HCL 75 MG PO CAPS: ORAL_CAPSULE | ORAL | Status: DC

## 2013-07-29 NOTE — Telephone Encounter (Signed)
Left detailed message Rx sent to pharmacy as requested.

## 2013-07-29 NOTE — Telephone Encounter (Signed)
[facility] no longer uses [facility]. Pt sent in new rx nortriptyline 75 mg #30 with refills to [facility] on [address]

## 2013-07-30 ENCOUNTER — Ambulatory Visit: Payer: Medicare Other | Admitting: Internal Medicine

## 2013-08-13 ENCOUNTER — Ambulatory Visit: Payer: Medicare Other | Admitting: Infectious Disease

## 2013-08-19 ENCOUNTER — Telehealth: Payer: Self-pay | Admitting: Dietician

## 2013-08-19 NOTE — Telephone Encounter (Signed)
Brief Outpatient Oncology Nutrition Note  Patient has been identified to be at risk on malnutrition screen.  Wt Readings from Last 10 Encounters:  07/22/13 124 lb 8 oz (56.473 kg)  07/10/13 124 lb 9.6 oz (56.518 kg)  07/03/13 130 lb 8 oz (59.194 kg)  06/30/13 129 lb (58.514 kg)  06/26/13 125 lb 1.6 oz (56.745 kg)  06/13/13 135 lb 9.6 oz (61.508 kg)  06/06/13 137 lb 11.2 oz (62.46 kg)  05/26/13 142 lb (64.411 kg)  05/23/13 136 lb 3.2 oz (61.78 kg)  05/22/13 138 lb (62.596 kg)    Dx:  Lung Cancer with mets to brain and lung.  S/p rad to brain.    Called patient due to weight loss.  Patient was unavailable.  Chart reviewed.  Last year patient weight was at 121 lbs.  Message left with contact information for the [place] [facility].  Antonieta Iba, RD, LDN

## 2013-08-27 ENCOUNTER — Telehealth: Payer: Self-pay | Admitting: Nutrition

## 2013-08-27 NOTE — Telephone Encounter (Signed)
Patient returned dietitian's phone call secondary to positive malnutrition risk screen nutrition.  Patient wanted to know if it was okay that he was eating Pinto beans.  Patient denies problems chewing and swallowing.  He does not eat a lot of meat.  Educated patient on strategies for increasing calories and protein to minimize further weight loss.  Educated patient on other sources of protein in his diet.  Will mail fact sheets and coupons for ensure.  Recommended patient drink oral nutrition supplements twice a day to 3 times a day.

## 2013-08-28 ENCOUNTER — Other Ambulatory Visit: Payer: Self-pay | Admitting: Radiation Therapy

## 2013-08-28 DIAGNOSIS — C7949 Secondary malignant neoplasm of other parts of nervous system: Principal | ICD-10-CM

## 2013-08-28 DIAGNOSIS — C7931 Secondary malignant neoplasm of brain: Secondary | ICD-10-CM

## 2013-08-29 ENCOUNTER — Ambulatory Visit: Admission: RE | Admit: 2013-08-29 | Payer: Medicare Other | Source: Ambulatory Visit

## 2013-08-29 ENCOUNTER — Ambulatory Visit
Admission: RE | Admit: 2013-08-29 | Discharge: 2013-08-29 | Disposition: A | Discharge status: Home / Self Care | Payer: Medicare Other | Source: Ambulatory Visit | Attending: Internal Medicine | Admitting: Internal Medicine

## 2013-08-29 DIAGNOSIS — C7931 Secondary malignant neoplasm of brain: Secondary | ICD-10-CM | POA: Diagnosis present

## 2013-08-29 DIAGNOSIS — C7949 Secondary malignant neoplasm of other parts of nervous system: Secondary | ICD-10-CM | POA: Diagnosis not present

## 2013-08-29 LAB — BUN AND CREATININE (CC13)
BUN: 7.2 mg/dL (ref 7.0–26.0)
CREATININE: 0.8 mg/dL (ref 0.7–1.3)

## 2013-09-01 ENCOUNTER — Ambulatory Visit: Admission: RE | Admit: 2013-09-01 | Payer: Medicare Other | Source: Ambulatory Visit | Admitting: Radiation Oncology

## 2013-09-05 ENCOUNTER — Other Ambulatory Visit: Payer: Self-pay | Admitting: Internal Medicine

## 2013-09-05 ENCOUNTER — Telehealth: Payer: Self-pay | Admitting: Internal Medicine

## 2013-09-05 MED ORDER — MIRTAZAPINE 15 MG PO TBDP: 15.0000 mg | ORAL_TABLET | Freq: Every day | ORAL | Status: DC

## 2013-09-05 NOTE — Telephone Encounter (Signed)
Wife called and said pt is not eating and would like Dr K to call her [phone]

## 2013-09-09 ENCOUNTER — Ambulatory Visit (HOSPITAL_COMMUNITY)
Admission: RE | Admit: 2013-09-09 | Discharge: 2013-09-09 | Disposition: A | Discharge status: Home / Self Care | Payer: Medicare Other | Source: Ambulatory Visit | Attending: Radiation Oncology | Admitting: Radiation Oncology

## 2013-09-09 DIAGNOSIS — C7931 Secondary malignant neoplasm of brain: Secondary | ICD-10-CM | POA: Insufficient documentation

## 2013-09-09 DIAGNOSIS — C7949 Secondary malignant neoplasm of other parts of nervous system: Secondary | ICD-10-CM | POA: Diagnosis not present

## 2013-09-09 DIAGNOSIS — C3490 Malignant neoplasm of unspecified part of unspecified bronchus or lung: Secondary | ICD-10-CM | POA: Diagnosis not present

## 2013-09-09 MED ORDER — GADOBENATE DIMEGLUMINE 529 MG/ML IV SOLN
10.0000 mL | Freq: Once | INTRAVENOUS | Status: AC | PRN
  Administered 2013-09-09: 11 mL via INTRAVENOUS

## 2013-09-10 ENCOUNTER — Encounter: Payer: Self-pay | Admitting: Radiation Oncology

## 2013-09-10 ENCOUNTER — Telehealth: Payer: Self-pay | Admitting: *Deleted

## 2013-09-10 ENCOUNTER — Ambulatory Visit: Payer: Medicare Other | Admitting: Radiation Oncology

## 2013-09-10 ENCOUNTER — Ambulatory Visit
Admission: RE | Admit: 2013-09-10 | Discharge: 2013-09-10 | Disposition: A | Discharge status: Home / Self Care | Payer: Medicare Other | Source: Ambulatory Visit | Attending: Radiation Oncology | Admitting: Radiation Oncology

## 2013-09-10 VITALS — BP 108/61 | HR 97 | Temp 97.6°F | Resp 20 | Ht 67.0 in | Wt 115.8 lb

## 2013-09-10 DIAGNOSIS — C7931 Secondary malignant neoplasm of brain: Secondary | ICD-10-CM

## 2013-09-10 DIAGNOSIS — C7949 Secondary malignant neoplasm of other parts of nervous system: Principal | ICD-10-CM

## 2013-09-10 NOTE — Progress Notes (Signed)
Follow up s/p  Rad tx whole Brain, had MRI 09/09/13, here for results, loose non prod, fatigued, no headaches, no blurred vision, no c/o pain, poor appetite, ate pinto beans this am, eats a few bites and then stops, drinks wataer, in w/c, still smoking  1 ppd, room air 99% , sob with exertion 10:19 AM

## 2013-09-10 NOTE — Progress Notes (Signed)
Radiation Oncology         [phone] ________________________________  Name: [name] MRN: [id number]  Date: 09/10/2013  DOB: 07-Jun-1950  Follow-Up Visit Note  CC: Nyoka Cowden, MD  Marletta Lor, MD  Diagnosis:   Metastatic lung cancer with brain metastasis  Interval Since Last Radiation:  3 months   Narrative:  The patient returns today for routine follow-up.  The patient completed whole brain radiation treatment on 06/16/2013. He states that he has done well over the last couple of months. He is not taking any Decadron at this time. No headaches, no nausea. No recent vision changes. The patient has been losing some weight and describes poor appetite. He has begun Remeron recently. The patient underwent a MRI scan of the brain on 09/09/2013 and he presents today to review this.                              ALLERGIES:  has No Known Allergies.  Meds: Current Outpatient Prescriptions  Medication Sig Dispense Refill  . acetaminophen (TYLENOL) 500 MG tablet Take 1,000 mg by mouth every 6 (six) hours as needed for pain.       Marland Kitchen aspirin 81 MG tablet Take 81 mg by mouth daily.      Marland Kitchen atorvastatin (LIPITOR) 20 MG tablet Take 1 tablet (20 mg total) by mouth daily.  90 tablet  1  . budesonide-formoterol (SYMBICORT) 160-4.5 MCG/ACT inhaler Inhale 2 puffs into the lungs 2 (two) times daily as needed (for shortness of breath).      . gabapentin (NEURONTIN) 600 MG tablet Take 1 tablet (600 mg total) by mouth every morning.  90 tablet  1  . nortriptyline (PAMELOR) 75 MG capsule TAKE 1 CAPSULE AT BEDTIME  90 capsule  1  . tamsulosin (FLOMAX) 0.4 MG CAPS capsule Take 1 capsule (0.4 mg total) by mouth daily.  90 capsule  1  . LORazepam (ATIVAN) 0.5 MG tablet Take 1 tablet (0.5 mg total) by mouth once. 1 tablet 30 minutes before procedure/treatment  11 tablet  0  . mirtazapine (REMERON SOL-TAB) 15 MG disintegrating tablet Take 1 tablet (15 mg total) by mouth at bedtime.  60  tablet  2   No current facility-administered medications for this encounter.    Physical Findings: The patient is in no acute distress. Patient is alert and oriented.  height is 5\' 7"  (1.702 m) and weight is 115 lb 12.8 oz (52.527 kg). His oral temperature is 97.6 F (36.4 C). His blood pressure is 108/61 and his pulse is 97. His respiration is 20 and oxygen saturation is 99%. .     Lab Findings: Lab Results  Component Value Date   WBC 8.6 06/19/2013   HGB 13.0 06/19/2013   HCT 38.8 06/19/2013   MCV 92.8 06/19/2013   PLT 217 06/19/2013     Radiographic Findings: Mr Jeri Cos LP Contrast  09/09/2013   CLINICAL DATA:  Metastatic lung cancer. Finished whole brain radiations 06/16/2013  EXAM: MRI HEAD WITHOUT AND WITH CONTRAST  TECHNIQUE: Multiplanar, multiecho pulse sequences of the brain and surrounding structures were obtained without and with intravenous contrast.  CONTRAST:  76mL MULTIHANCE GADOBENATE DIMEGLUMINE 529 MG/ML IV SOLN  COMPARISON:  MRI head 05/27/2013  FINDINGS: Multiple metastatic deposits in the brain have improved since the prior study.  Left posterior frontal cystic mass now measures 21 x 19 mm. Central mural nodularity is present which may  represent treated or residual tumor. Small amount of methemoglobin.  Right frontal mass has markedly improved with encephalomalacia and no significant residual enhancement. Small amount of hemorrhage.  Right temporal lobe mass is significantly improved. Methemoglobin from prior hemorrhage is noted in the lesion without evidence of residual enhancing tumor.  Right occipital lobe lesion has nearly completely resolved with a small amount of methemoglobin where the tumor previously was located.  Right parietal lesion is markedly improved and is now cystic with a small amount of internal hemorrhage. No significant enhancing tumor.  Right occipital tumor is markedly improved with a small area of residual enhancement measuring under 1 cm.  Ventricle size is  normal. No shift of the midline structures. Chronic lacunar infarcts in the left thalamus and right posterior basal ganglia unchanged. No acute infarct.  Diffuse white matter hyperintensity has progressed in the interval.  IMPRESSION: Marked improvement in multiple metastatic deposits. Most of these show evidence of prior hemorrhage. No definite residual enhancing tumor is present.  Progression of white matter signal changes due to radiation.   Electronically Signed   By: Franchot Gallo M.D.   On: 09/09/2013 16:16    Impression:    The MRI scan looked very good. Improved intracranial metastases and no new metastatic deposits.  Plan:  We will continue followup to our brain program and we will obtain an MRI scan of the brain once again in 3 months. The patient is due for her repeat imaging with CT scans through Dr. Julien Nordmann in September. The patient knows that I would be happy to see him sooner if there are any additional issues we need to address.   Jodelle Gross, M.D., Ph.D.

## 2013-09-10 NOTE — Telephone Encounter (Signed)
Left vm on social worker phone lauren mullis,patient asking about any more visa cards, stated he did a survey in the past and received one 10:41 AM

## 2013-09-11 ENCOUNTER — Encounter: Payer: Self-pay | Admitting: *Deleted

## 2013-09-11 NOTE — Progress Notes (Signed)
[place] Work  Clinical Social Work was referred by radiation oncology per patient request.  CSW contacted patient by phone.  Mr. Terrance shared he once received a VISA gift card at cancer center after completing survey.  CSW is unaware of such program.  Mr. Gadson is currently on observation- CSW encouraged patient to contact CSW if he begins treatment and she will identify potential financial resources.   Polo Riley, MSW, LCSW, OSW-C Clinical Social Worker [facility] [phone]

## 2013-09-24 ENCOUNTER — Emergency Department (HOSPITAL_COMMUNITY): Payer: Medicare Other

## 2013-09-24 ENCOUNTER — Inpatient Hospital Stay (HOSPITAL_COMMUNITY)
Admission: EM | Admit: 2013-09-24 | Discharge: 2013-09-27 | DRG: 193 | Disposition: A | Discharge status: Home / Self Care | Payer: Medicare Other | Attending: Internal Medicine | Admitting: Internal Medicine

## 2013-09-24 ENCOUNTER — Encounter (HOSPITAL_COMMUNITY): Payer: Self-pay | Admitting: Emergency Medicine

## 2013-09-24 DIAGNOSIS — Z8673 Personal history of transient ischemic attack (TIA), and cerebral infarction without residual deficits: Secondary | ICD-10-CM

## 2013-09-24 DIAGNOSIS — Z720 Tobacco use: Secondary | ICD-10-CM | POA: Diagnosis present

## 2013-09-24 DIAGNOSIS — Z9221 Personal history of antineoplastic chemotherapy: Secondary | ICD-10-CM | POA: Diagnosis not present

## 2013-09-24 DIAGNOSIS — C7931 Secondary malignant neoplasm of brain: Secondary | ICD-10-CM | POA: Diagnosis present

## 2013-09-24 DIAGNOSIS — E785 Hyperlipidemia, unspecified: Secondary | ICD-10-CM | POA: Diagnosis present

## 2013-09-24 DIAGNOSIS — C7949 Secondary malignant neoplasm of other parts of nervous system: Secondary | ICD-10-CM

## 2013-09-24 DIAGNOSIS — C3490 Malignant neoplasm of unspecified part of unspecified bronchus or lung: Secondary | ICD-10-CM | POA: Diagnosis present

## 2013-09-24 DIAGNOSIS — I739 Peripheral vascular disease, unspecified: Secondary | ICD-10-CM

## 2013-09-24 DIAGNOSIS — C3492 Malignant neoplasm of unspecified part of left bronchus or lung: Secondary | ICD-10-CM

## 2013-09-24 DIAGNOSIS — J4489 Other specified chronic obstructive pulmonary disease: Secondary | ICD-10-CM

## 2013-09-24 DIAGNOSIS — Z23 Encounter for immunization: Secondary | ICD-10-CM

## 2013-09-24 DIAGNOSIS — Z7982 Long term (current) use of aspirin: Secondary | ICD-10-CM | POA: Diagnosis not present

## 2013-09-24 DIAGNOSIS — I96 Gangrene, not elsewhere classified: Secondary | ICD-10-CM | POA: Diagnosis present

## 2013-09-24 DIAGNOSIS — J449 Chronic obstructive pulmonary disease, unspecified: Secondary | ICD-10-CM | POA: Diagnosis present

## 2013-09-24 DIAGNOSIS — E876 Hypokalemia: Secondary | ICD-10-CM

## 2013-09-24 DIAGNOSIS — E43 Unspecified severe protein-calorie malnutrition: Secondary | ICD-10-CM

## 2013-09-24 DIAGNOSIS — F17200 Nicotine dependence, unspecified, uncomplicated: Secondary | ICD-10-CM | POA: Diagnosis present

## 2013-09-24 DIAGNOSIS — Z681 Body mass index (BMI) 19 or less, adult: Secondary | ICD-10-CM

## 2013-09-24 DIAGNOSIS — I2510 Atherosclerotic heart disease of native coronary artery without angina pectoris: Secondary | ICD-10-CM | POA: Diagnosis present

## 2013-09-24 DIAGNOSIS — N400 Enlarged prostate without lower urinary tract symptoms: Secondary | ICD-10-CM | POA: Diagnosis present

## 2013-09-24 DIAGNOSIS — J189 Pneumonia, unspecified organism: Secondary | ICD-10-CM

## 2013-09-24 DIAGNOSIS — Z923 Personal history of irradiation: Secondary | ICD-10-CM | POA: Diagnosis not present

## 2013-09-24 DIAGNOSIS — I10 Essential (primary) hypertension: Secondary | ICD-10-CM | POA: Diagnosis present

## 2013-09-24 DIAGNOSIS — Z9981 Dependence on supplemental oxygen: Secondary | ICD-10-CM

## 2013-09-24 DIAGNOSIS — Z8679 Personal history of other diseases of the circulatory system: Secondary | ICD-10-CM

## 2013-09-24 DIAGNOSIS — R630 Anorexia: Secondary | ICD-10-CM

## 2013-09-24 LAB — I-STAT CHEM 8, ED
BUN: 11 mg/dL (ref 6–23)
CALCIUM ION: 1.1 mmol/L — AB (ref 1.13–1.30)
CREATININE: 0.9 mg/dL (ref 0.50–1.35)
Chloride: 100 mEq/L (ref 96–112)
Glucose, Bld: 130 mg/dL — ABNORMAL HIGH (ref 70–99)
HEMATOCRIT: 40 % (ref 39.0–52.0)
HEMOGLOBIN: 13.6 g/dL (ref 13.0–17.0)
POTASSIUM: 3.3 meq/L — AB (ref 3.7–5.3)
Sodium: 132 mEq/L — ABNORMAL LOW (ref 137–147)
TCO2: 25 mmol/L (ref 0–100)

## 2013-09-24 LAB — CBC WITH DIFFERENTIAL/PLATELET
BASOS ABS: 0.1 10*3/uL (ref 0.0–0.1)
BASOS PCT: 1 % (ref 0–1)
EOS ABS: 0 10*3/uL (ref 0.0–0.7)
Eosinophils Relative: 0 % (ref 0–5)
HCT: 35.4 % — ABNORMAL LOW (ref 39.0–52.0)
HEMOGLOBIN: 12 g/dL — AB (ref 13.0–17.0)
Lymphocytes Relative: 8 % — ABNORMAL LOW (ref 12–46)
Lymphs Abs: 1.1 10*3/uL (ref 0.7–4.0)
MCH: 30.2 pg (ref 26.0–34.0)
MCHC: 33.9 g/dL (ref 30.0–36.0)
MCV: 88.9 fL (ref 78.0–100.0)
Monocytes Absolute: 0.8 10*3/uL (ref 0.1–1.0)
Monocytes Relative: 6 % (ref 3–12)
NEUTROS ABS: 12 10*3/uL — AB (ref 1.7–7.7)
NEUTROS PCT: 85 % — AB (ref 43–77)
Platelets: 300 10*3/uL (ref 150–400)
RBC: 3.98 MIL/uL — ABNORMAL LOW (ref 4.22–5.81)
RDW: 15 % (ref 11.5–15.5)
WBC: 14.1 10*3/uL — ABNORMAL HIGH (ref 4.0–10.5)

## 2013-09-24 LAB — I-STAT CG4 LACTIC ACID, ED: Lactic Acid, Venous: 1.1 mmol/L (ref 0.5–2.2)

## 2013-09-24 LAB — BASIC METABOLIC PANEL
ANION GAP: 11 (ref 5–15)
BUN: 13 mg/dL (ref 6–23)
CO2: 25 mEq/L (ref 19–32)
CREATININE: 0.86 mg/dL (ref 0.50–1.35)
Calcium: 8.9 mg/dL (ref 8.4–10.5)
Chloride: 94 mEq/L — ABNORMAL LOW (ref 96–112)
GFR calc non Af Amer: 90 mL/min — ABNORMAL LOW (ref >90)
Glucose, Bld: 130 mg/dL — ABNORMAL HIGH (ref 70–99)
POTASSIUM: 3.5 meq/L — AB (ref 3.7–5.3)
Sodium: 130 mEq/L — ABNORMAL LOW (ref 137–147)

## 2013-09-24 MED ORDER — PIPERACILLIN-TAZOBACTAM 3.375 G IVPB: 3.3750 g | Freq: Once | INTRAVENOUS | Status: DC

## 2013-09-24 MED ORDER — VANCOMYCIN HCL 10 G IV SOLR
1250.0000 mg | Freq: Once | INTRAVENOUS | Status: DC
  Filled 2013-09-24: qty 1250

## 2013-09-24 MED ORDER — IOHEXOL 350 MG/ML SOLN
100.0000 mL | Freq: Once | INTRAVENOUS | Status: AC | PRN
  Administered 2013-09-24: 100 mL via INTRAVENOUS

## 2013-09-24 MED ORDER — PIPERACILLIN-TAZOBACTAM 3.375 G IVPB 30 MIN: 3.3750 g | Freq: Once | INTRAVENOUS | Status: DC

## 2013-09-24 MED ORDER — DEXTROSE 5 % IV SOLN
500.0000 mg | Freq: Once | INTRAVENOUS | Status: AC
  Administered 2013-09-25: 500 mg via INTRAVENOUS
  Filled 2013-09-24: qty 500

## 2013-09-24 MED ORDER — SODIUM CHLORIDE 0.9 % IV BOLUS (SEPSIS)
1000.0000 mL | Freq: Once | INTRAVENOUS | Status: AC
  Administered 2013-09-24: 1000 mL via INTRAVENOUS

## 2013-09-24 MED ORDER — CEFTRIAXONE SODIUM 1 G IJ SOLR
1.0000 g | Freq: Once | INTRAMUSCULAR | Status: AC
  Administered 2013-09-24: 1 g via INTRAVENOUS
  Filled 2013-09-24: qty 10

## 2013-09-24 NOTE — ED Notes (Signed)
Bed: [facility] Expected date:  Expected time:  Means of arrival:  Comments: EMS 63yo increased weakness / decreased appetite

## 2013-09-24 NOTE — ED Provider Notes (Signed)
CSN: [id number]     Arrival date & time 09/24/13  1945 History   First MD Initiated Contact with Patient 09/24/13 2001     Chief Complaint  Patient presents with  . 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 Patient reports gradual onset of symptoms that are progressively worsening. Patient reports generalized weakness and decreased appetite. Patient's wife is at the bedside who reports he has "only eaten a few bites of food and drank coke for the past few days." Patient denies any other symptoms such as abdominal pain, fever, SOB. He does not wear oxygen at home. He is not currently receiving chemotherapy, but he is receiving radiation therapy for brain metastases that are stable according to his last follow up appointment.   Radiation Oncologist-Dr. Kyung Rudd Oncologist- Dr. Curt Bears   Past Medical History  Diagnosis Date  . BENIGN PROSTATIC HYPERTROPHY 12/24/2009  . CEREBROVASCULAR ACCIDENT, HX OF 12/24/2009  . HYPERLIPIDEMIA 12/24/2009  . HYPERTENSION 12/24/2009  . Testosterone deficiency   . ED (erectile dysfunction)   . Claudication in peripheral vascular disease, lifestyle limiting, with abnormal arterial dopplers of lower ext. 06/30/2011  . S/P angioplasty with stent to Lt. SFA 06/30/11 06/30/2011  . COPD 12/24/2009  . Shortness of breath     "from the COPD" (10/13/2011)  . Stroke 1996    "right side not as strong as left since; numbness R leg/foot" (10/13/2011)  . Pernicious anemia   . Complication of anesthesia     hard to wake up  . Pneumonia   . 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 Brain metastases 05/22/13     ct head   . History of radiation therapy 06/02/13-06/16/13    brain   Past Surgical History  Procedure Laterality Date  . Cervical fusion  1980's    "total of 3-4 OR's on my neck; pinched nerve" (10/13/2011)  . Aneurysm coiling  2007  . Peripheral arterial stent graft  06/30/2010; 10/13/10    left; left  . Video bronchoscopy Bilateral 04/03/2012    Procedure: VIDEO BRONCHOSCOPY WITHOUT FLUORO;  Surgeon: Tanda Rockers, MD;  Location: [facility];  Service: Cardiopulmonary;  Laterality: Bilateral;  . Amputation Left 08/26/2012    Procedure: IRRIGATION AND DEBRIDMENT LEFT DORSAL ABSCESS/ 4TH AND 5TH LEFT RAY AMPUTATION ;  Surgeon: Wylene Simmer, MD;  Location: [facility];  Service: Orthopedics;  Laterality: Left;  . Femoral-popliteal bypass graft Left 09/03/2012    Procedure: BYPASS GRAFT FEMORAL-POPLITEAL ARTERY;  Surgeon: Serafina Mitchell, MD;  Location: [place];  Service: Vascular;  Laterality: Left;  . Cardiac catheterization    . Colonoscopy      hx: of  . I&d extremity Left 09/26/2012    Procedure: IRRIGATION AND DEBRIDEMENT EXTREMITY- LEFT FOOT WITH WOUND VAC PLACEMENT;  Surgeon: Serafina Mitchell, MD;  Location: [facility];  Service: Vascular;  Laterality: Left;   Family History  Problem Relation Age of Onset  . Emphysema Father     smoked  . Emphysema Paternal Uncle     smoked  . Heart disease Mother   . Lung cancer Father     smoked  . Lung cancer Paternal Grandfather  smoked   History  Substance Use Topics  . Smoking status: Current Every Day Smoker -- 0.75 packs/day for 45 years    Types: Cigarettes  . Smokeless tobacco: Never Used     Comment: cutting back  . Alcohol Use: No    Review of Systems  Constitutional: Positive for appetite change and fatigue. Negative for fever and chills.  HENT: Negative for trouble swallowing.   Eyes: Negative for visual disturbance.  Respiratory: Negative for shortness of breath.   Cardiovascular: Negative for chest pain and palpitations.   Gastrointestinal: Negative for nausea, vomiting, abdominal pain and diarrhea.  Genitourinary: Negative for dysuria and difficulty urinating.  Musculoskeletal: Negative for arthralgias and neck pain.  Skin: Negative for color change.  Neurological: Positive for weakness. Negative for dizziness.  Psychiatric/Behavioral: Negative for dysphoric mood.      Allergies  Review of patient's allergies indicates no known allergies.  Home Medications   Prior to Admission medications   Medication Sig Start Date End Date Taking? Authorizing Provider  acetaminophen (TYLENOL) 500 MG tablet Take 1,000 mg by mouth every 6 (six) hours as needed for pain.     Historical Provider, MD  aspirin 81 MG tablet Take 81 mg by mouth daily.    Historical Provider, MD  atorvastatin (LIPITOR) 20 MG tablet Take 1 tablet (20 mg total) by mouth daily. 06/20/13   Marletta Lor, MD  budesonide-formoterol [facility]) 160-4.5 MCG/ACT inhaler Inhale 2 puffs into the lungs 2 (two) times daily as needed (for shortness of breath).    Historical Provider, MD  gabapentin (NEURONTIN) 600 MG tablet Take 1 tablet (600 mg total) by mouth every morning. 06/20/13   Marletta Lor, MD  LORazepam (ATIVAN) 0.5 MG tablet Take 1 tablet (0.5 mg total) by mouth once. 1 tablet 30 minutes before procedure/treatment 05/26/13   Marye Round, MD  mirtazapine (REMERON SOL-TAB) 15 MG disintegrating tablet Take 1 tablet (15 mg total) by mouth at bedtime. 09/05/13   Marletta Lor, MD  nortriptyline (PAMELOR) 75 MG capsule TAKE 1 CAPSULE AT BEDTIME 07/29/13   Marletta Lor, MD  tamsulosin [facility]) 0.4 MG CAPS capsule Take 1 capsule (0.4 mg total) by mouth daily. 06/20/13   Marletta Lor, MD   BP 99/54  Pulse 98  Temp(Src) 98.6 F (37 C) (Oral)  Resp 18  SpO2 90% Physical Exam  Nursing note and vitals reviewed. Constitutional: He is oriented to person, place, and time. He appears well-developed and well-nourished. No  distress.  HENT:  Head: Normocephalic and atraumatic.  Eyes: Conjunctivae and EOM are normal. Pupils are equal, round, and reactive to light.  Neck: Normal range of motion.  Cardiovascular: Normal rate and regular rhythm.  Exam reveals no gallop and no friction rub.   No murmur heard. Pulmonary/Chest: Effort normal. He has no wheezes. He has rales.  Rales and rhonchi noted in all lung fields except RLL. Diminished lung sounds in RLL.   Abdominal: Soft. He exhibits no distension. There is no tenderness. There is no rebound and no guarding.  Musculoskeletal: Normal range of motion.  Neurological: He is alert and oriented to person, place, and time. Coordination normal.  Speech is goal-oriented. Moves limbs without ataxia.   Skin: Skin is warm and dry.  Psychiatric: He has a normal mood and affect. His behavior is normal.    ED Course  Procedures (including critical care time) Labs Review Labs Reviewed  CBC WITH DIFFERENTIAL - Abnormal; Notable for the following:  WBC 14.1 (*)    RBC 3.98 (*)    Hemoglobin 12.0 (*)    HCT 35.4 (*)    Neutrophils Relative % 85 (*)    Neutro Abs 12.0 (*)    Lymphocytes Relative 8 (*)    All other components within normal limits  BASIC METABOLIC PANEL - Abnormal; Notable for the following:    Sodium 130 (*)    Potassium 3.5 (*)    Chloride 94 (*)    Glucose, Bld 130 (*)    GFR calc non Af Amer 90 (*)    All other components within normal limits  I-STAT CHEM 8, ED - Abnormal; Notable for the following:    Sodium 132 (*)    Potassium 3.3 (*)    Glucose, Bld 130 (*)    Calcium, Ion 1.10 (*)    All other components within normal limits  CBG MONITORING, ED  I-STAT CG4 LACTIC ACID, ED    Imaging Review Dg Chest 2 View  09/24/2013   CLINICAL DATA:  Shortness of breath, weakness, history of lung cancer  EXAM: CHEST  2 VIEW  COMPARISON:  CT chest dated 06/19/2013  FINDINGS: Chronic interstitial markings. Medial left upper lobe nodule, similar to  prior CT chest, worrisome for metastatic disease. Mild patchy bilateral lower lobe opacities, atelectasis versus pneumonia. No pleural effusion or pneumothorax.  Heart is normal in size.  Degenerative changes of the visualized thoracolumbar spine.  IMPRESSION: Medial left upper lobe nodule, similar to prior CT chest, worrisome for metastatic disease.  Mild patchy bilateral lower lobe opacities, atelectasis versus pneumonia.   Electronically Signed   By: Julian Hy M.D.   On: 09/24/2013 21:33   Ct Angio Chest Pe W/cm &/or Wo Cm  09/24/2013   CLINICAL DATA:  Lung cancer with brain metastases, pneumonia, evaluate for PE  EXAM: CT ANGIOGRAPHY CHEST WITH CONTRAST  TECHNIQUE: Multidetector CT imaging of the chest was performed using the standard protocol during bolus administration of intravenous contrast. Multiplanar CT image reconstructions and MIPs were obtained to evaluate the vascular anatomy.  CONTRAST:  154mL OMNIPAQUE IOHEXOL 350 MG/ML SOLN  COMPARISON:  CT chest dated 06/19/2013  FINDINGS: No evidence of pulmonary embolism.  1.4 x 1.7 cm medial left upper lobe nodule (series 12/image 34), previously 1.3 x 1.0 cm, suspicious for metastasis. Additional clustered satellite nodularity in the left upper lobe (series 12/image 28).  Multifocal patchy opacity predominantly in the right lower lobe (series 12/image 90), and to a lesser extent in the right middle lobe (series 12/ image 69) and left lower lobe (series 12/ image 86), suspicious for multifocal pneumonia.  Underlying mild centrilobular emphysematous changes. No pleural effusion or pneumothorax.  Visualized thyroid is unremarkable.  The heart is normal in size. No pericardial effusion. Coronary atherosclerosis. Atherosclerotic calcifications of the aortic arch.  Progression of thoracic lymphadenopathy, including:  --16 mm short axis left axillary node (series 5/ image 29), previously 11 mm  --9 mm short axis subcarinal node (series 5/ image 55),  previously 8 mm  --7 mm short axis right hilar node (series 5/image 56), new  Visualized upper abdomen is notable for mild nodular thickening of the left adrenal gland (series 5/image 104), indeterminate, early metastasis not excluded.  Degenerative changes of the visualized thoracolumbar spine. Exaggerated thoracic kyphosis. Cervical spine fixation hardware.  Review of the MIP images confirms the above findings.  IMPRESSION: No evidence of pulmonary embolism.  1.7 cm medial left upper lobe nodule, previously 1.3 cm, suspicious for  metastasis. Progression of associated thoracic lymphadenopathy. Possible early left adrenal metastasis, indeterminate.  Multifocal pneumonia, right lower lobe predominant.   Electronically Signed   By: Julian Hy M.D.   On: 09/24/2013 23:07     EKG Interpretation None      MDM   Final diagnoses:  CAP (community acquired pneumonia)  Oxygen dependent    8:17 PM Labs, urinalysis, chest xray pending. Patient will have fluids. Vitals stable and patient afebrile. Patient is oxygen dependent here and does NOT wear oxygen at home. He is breathing comfortably on 2L nasal cannula.   10:00 PM Patient's xray concerning for atelectasis vs pneumonia. Patient will have CT angio to rule out PE vs pneumonia. Labs show elevated WBC at 14.1.   11:29 PM Patient's chest CT shows multifocal pneumonia. Patient will have IV azithromycin and rocephin. Patient will be admitted.     Alvina Chou, PA-C 09/25/13 0021

## 2013-09-24 NOTE — ED Notes (Signed)
Pt brought in by EMS. Pt c/o increased weakness and decreased appetite. Pt O2 stat were low at home in the 90's. Pt alert and oriented. Pt reports having stage 4 lung cancer with no chemo for a year. Family at bedside.

## 2013-09-25 DIAGNOSIS — I2510 Atherosclerotic heart disease of native coronary artery without angina pectoris: Secondary | ICD-10-CM | POA: Diagnosis present

## 2013-09-25 DIAGNOSIS — E876 Hypokalemia: Secondary | ICD-10-CM | POA: Diagnosis present

## 2013-09-25 DIAGNOSIS — I10 Essential (primary) hypertension: Secondary | ICD-10-CM

## 2013-09-25 DIAGNOSIS — Z9981 Dependence on supplemental oxygen: Secondary | ICD-10-CM

## 2013-09-25 DIAGNOSIS — C7949 Secondary malignant neoplasm of other parts of nervous system: Secondary | ICD-10-CM

## 2013-09-25 DIAGNOSIS — E43 Unspecified severe protein-calorie malnutrition: Secondary | ICD-10-CM | POA: Insufficient documentation

## 2013-09-25 DIAGNOSIS — C7931 Secondary malignant neoplasm of brain: Secondary | ICD-10-CM

## 2013-09-25 DIAGNOSIS — J189 Pneumonia, unspecified organism: Principal | ICD-10-CM

## 2013-09-25 DIAGNOSIS — I739 Peripheral vascular disease, unspecified: Secondary | ICD-10-CM

## 2013-09-25 DIAGNOSIS — R630 Anorexia: Secondary | ICD-10-CM

## 2013-09-25 DIAGNOSIS — C3490 Malignant neoplasm of unspecified part of unspecified bronchus or lung: Secondary | ICD-10-CM

## 2013-09-25 LAB — PROTIME-INR
INR: 1.18 (ref 0.00–1.49)
Prothrombin Time: 15 seconds (ref 11.6–15.2)

## 2013-09-25 LAB — COMPREHENSIVE METABOLIC PANEL
AST: 7 U/L (ref 0–37)
Albumin: 2.3 g/dL — ABNORMAL LOW (ref 3.5–5.2)
Alkaline Phosphatase: 101 U/L (ref 39–117)
Anion gap: 8 (ref 5–15)
BUN: 10 mg/dL (ref 6–23)
CALCIUM: 8.2 mg/dL — AB (ref 8.4–10.5)
CO2: 26 mEq/L (ref 19–32)
CREATININE: 0.78 mg/dL (ref 0.50–1.35)
Chloride: 98 mEq/L (ref 96–112)
GFR calc Af Amer: >90 mL/min (ref >90)
GFR calc non Af Amer: >90 mL/min (ref >90)
Glucose, Bld: 105 mg/dL — ABNORMAL HIGH (ref 70–99)
Potassium: 3.1 mEq/L — ABNORMAL LOW (ref 3.7–5.3)
SODIUM: 132 meq/L — AB (ref 137–147)
Total Bilirubin: 0.4 mg/dL (ref 0.3–1.2)
Total Protein: 5.6 g/dL — ABNORMAL LOW (ref 6.0–8.3)

## 2013-09-25 LAB — CBC WITH DIFFERENTIAL/PLATELET
BASOS ABS: 0.1 10*3/uL (ref 0.0–0.1)
BASOS PCT: 1 % (ref 0–1)
EOS PCT: 3 % (ref 0–5)
Eosinophils Absolute: 0.2 10*3/uL (ref 0.0–0.7)
HEMATOCRIT: 32 % — AB (ref 39.0–52.0)
Hemoglobin: 10.5 g/dL — ABNORMAL LOW (ref 13.0–17.0)
Lymphocytes Relative: 17 % (ref 12–46)
Lymphs Abs: 1.3 10*3/uL (ref 0.7–4.0)
MCH: 30.2 pg (ref 26.0–34.0)
MCHC: 32.8 g/dL (ref 30.0–36.0)
MCV: 92 fL (ref 78.0–100.0)
Monocytes Absolute: 0.8 10*3/uL (ref 0.1–1.0)
Monocytes Relative: 10 % (ref 3–12)
Neutro Abs: 5.1 10*3/uL (ref 1.7–7.7)
Neutrophils Relative %: 69 % (ref 43–77)
PLATELETS: 282 10*3/uL (ref 150–400)
RBC: 3.48 MIL/uL — ABNORMAL LOW (ref 4.22–5.81)
RDW: 15.2 % (ref 11.5–15.5)
WBC: 7.5 10*3/uL (ref 4.0–10.5)

## 2013-09-25 LAB — GLUCOSE, CAPILLARY
GLUCOSE-CAPILLARY: 127 mg/dL — AB (ref 70–99)
Glucose-Capillary: 120 mg/dL — ABNORMAL HIGH (ref 70–99)

## 2013-09-25 LAB — MAGNESIUM: MAGNESIUM: 1.5 mg/dL (ref 1.5–2.5)

## 2013-09-25 LAB — STREP PNEUMONIAE URINARY ANTIGEN: Strep Pneumo Urinary Antigen: NEGATIVE

## 2013-09-25 MED ORDER — GUAIFENESIN ER 600 MG PO TB12
600.0000 mg | ORAL_TABLET | Freq: Two times a day (BID) | ORAL | Status: DC
  Administered 2013-09-25 – 2013-09-27 (×5): 600 mg via ORAL
  Filled 2013-09-25 (×6): qty 1

## 2013-09-25 MED ORDER — HYDROMORPHONE HCL PF 1 MG/ML IJ SOLN: 1.0000 mg | INTRAMUSCULAR | Status: DC | PRN

## 2013-09-25 MED ORDER — SODIUM CHLORIDE 0.9 % IV SOLN
INTRAVENOUS | Status: DC
  Administered 2013-09-25 – 2013-09-26 (×4): via INTRAVENOUS

## 2013-09-25 MED ORDER — ENOXAPARIN SODIUM 40 MG/0.4ML ~~LOC~~ SOLN
40.0000 mg | SUBCUTANEOUS | Status: DC
  Filled 2013-09-25: qty 0.4

## 2013-09-25 MED ORDER — POTASSIUM CHLORIDE CRYS ER 20 MEQ PO TBCR
40.0000 meq | EXTENDED_RELEASE_TABLET | Freq: Once | ORAL | Status: AC
  Administered 2013-09-25: 40 meq via ORAL
  Filled 2013-09-25 (×2): qty 2

## 2013-09-25 MED ORDER — ASPIRIN EC 81 MG PO TBEC
81.0000 mg | DELAYED_RELEASE_TABLET | Freq: Every day | ORAL | Status: DC
  Administered 2013-09-25 – 2013-09-26 (×3): 81 mg via ORAL
  Filled 2013-09-25 (×4): qty 1

## 2013-09-25 MED ORDER — TAMSULOSIN HCL 0.4 MG PO CAPS
0.4000 mg | ORAL_CAPSULE | Freq: Every day | ORAL | Status: DC
  Administered 2013-09-25 – 2013-09-27 (×3): 0.4 mg via ORAL
  Filled 2013-09-25 (×3): qty 1

## 2013-09-25 MED ORDER — NORTRIPTYLINE HCL 25 MG PO CAPS
75.0000 mg | ORAL_CAPSULE | Freq: Every day | ORAL | Status: DC
  Administered 2013-09-25 – 2013-09-26 (×3): 75 mg via ORAL
  Filled 2013-09-25 (×4): qty 3

## 2013-09-25 MED ORDER — ATORVASTATIN CALCIUM 20 MG PO TABS
20.0000 mg | ORAL_TABLET | Freq: Every day | ORAL | Status: DC
  Administered 2013-09-25 – 2013-09-26 (×2): 20 mg via ORAL
  Filled 2013-09-25 (×3): qty 1

## 2013-09-25 MED ORDER — DEXTROSE 5 % IV SOLN
500.0000 mg | INTRAVENOUS | Status: DC
  Administered 2013-09-25 – 2013-09-26 (×2): 500 mg via INTRAVENOUS
  Filled 2013-09-25 (×2): qty 500

## 2013-09-25 MED ORDER — ONDANSETRON HCL 4 MG/2ML IJ SOLN: 4.0000 mg | Freq: Three times a day (TID) | INTRAMUSCULAR | Status: DC | PRN

## 2013-09-25 MED ORDER — DEXTROSE 5 % IV SOLN
1.0000 g | INTRAVENOUS | Status: DC
  Administered 2013-09-25 – 2013-09-26 (×2): 1 g via INTRAVENOUS
  Filled 2013-09-25 (×2): qty 10

## 2013-09-25 MED ORDER — BOOST PLUS PO LIQD
237.0000 mL | Freq: Three times a day (TID) | ORAL | Status: DC
  Administered 2013-09-25 – 2013-09-27 (×3): 237 mL via ORAL
  Filled 2013-09-25 (×8): qty 237

## 2013-09-25 MED ORDER — MEGESTROL ACETATE 40 MG PO TABS
160.0000 mg | ORAL_TABLET | Freq: Every day | ORAL | Status: DC
  Administered 2013-09-25 – 2013-09-27 (×3): 160 mg via ORAL
  Filled 2013-09-25 (×3): qty 4

## 2013-09-25 MED ORDER — INFLUENZA VAC SPLIT QUAD 0.5 ML IM SUSY
0.5000 mL | PREFILLED_SYRINGE | INTRAMUSCULAR | Status: AC
  Administered 2013-09-26: 0.5 mL via INTRAMUSCULAR
  Filled 2013-09-25 (×2): qty 0.5

## 2013-09-25 MED ORDER — GABAPENTIN 300 MG PO CAPS
600.0000 mg | ORAL_CAPSULE | Freq: Every day | ORAL | Status: DC
  Administered 2013-09-25 – 2013-09-27 (×3): 600 mg via ORAL
  Filled 2013-09-25 (×3): qty 2

## 2013-09-25 NOTE — Progress Notes (Addendum)
INITIAL NUTRITION ASSESSMENT  DOCUMENTATION CODES Per approved criteria  -Severe malnutrition in the context of chronic illness -Underweight  Pt meets criteria for severe MALNUTRITION in the context of chronic illness as evidenced by PO intake < 75% for > one month, 8% body weight loss in one month, severe muscle wasting and subcutaneous fat loss.   INTERVENTION: -Recommend Boost Plus TID -Encouraged intake of high protein foods/snacks -Provide pt with snacks BID- peanut butter w/graham crackers and ice cream  -Provided pt with supplement samples and coupons -Consider modify Megace dosage as warranted to improve it's appetite stimulant efficacy -Will continue to monitor  NUTRITION DIAGNOSIS: Inadequate oral intake related to loss of taste/decreased appetite/early satiety as evidenced by PO intake < 75%, wt loss, BMI < 18.5.   Goal: Pt to meet >/= 90% of their estimated nutrition needs    Monitor:  Total protein/energy intake, labs, weights, education needs  Reason for Assessment: Underweight BMI/Consult to Assess  63 y.o. male  Admitting Dx: CAP (community acquired pneumonia)  ASSESSMENT: [name] is a 63 y.o. 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 The patient presented with complaints of generalized weakness that has been ongoing since last few weeks progressively worsening over last few days  -Pt endorsed ongoing > 20 lb unintentional weight loss for at least past 3 months. Has lost 10 lbs in past one month per previous medical records (8% body weight loss, severe for time frame) -Diet recall indicates pt consuming one meal and some snacks; meal usually consisting of pinto beans. Reported food losing its taste, and dislikes taste of animal proteins - Encouraged pt to consume other high kcal/protein foods - eggs, yogurt, ice cream, peanut butter, etc.  -Pt has tried  Ensure and Boost previously, but does not consume them regularly d/t financial reasons. Was followed up by outpatient oncology RD via telephone on 08/27/2013; RD note indicated supplement coupons and high protein/kcal educational handouts were mailed to pt -RD provided pt with additional supplement coupons -Started on Megace, 160 mg once daily. Consider increasing dosage to increase efficacy -Nutrition Focused Physical Exam:  Subcutaneous Fat:  Orbital Region: moderate wasting Upper Arm Region: severe wasting Thoracic and Lumbar Region: moderate wasting  Muscle:  Temple Region: moderate wasting Clavicle Bone Region: severe wasting Clavicle and Acromion Bone Region: severe wasting Scapular Bone Region: moderate wasting Dorsal Hand: n/a Patellar Region: severe Anterior Thigh Region: severe Posterior Calf Region: severe  Edema: none    Height: Ht Readings from Last 1 Encounters:  09/25/13 5\' 7"  (1.702 m)    Weight: Wt Readings from Last 1 Encounters:  09/25/13 113 lb 15.7 oz (51.7 kg)    Ideal Body Weight: 148 lbs  % Ideal Body Weight: 76%  Wt Readings from Last 10 Encounters:  09/25/13 113 lb 15.7 oz (51.7 kg)  09/10/13 115 lb 12.8 oz (52.527 kg)  07/22/13 124 lb 8 oz (56.473 kg)  07/10/13 124 lb 9.6 oz (56.518 kg)  07/03/13 130 lb 8 oz (59.194 kg)  06/30/13 129 lb (58.514 kg)  06/26/13 125 lb 1.6 oz (56.745 kg)  06/13/13 135 lb 9.6 oz (61.508 kg)  06/06/13 137 lb 11.2 oz (62.46 kg)  05/26/13 142 lb (64.411 kg)    Usual Body Weight:  130-135 lbs per previous medical records  % Usual Body Weight: 87%  BMI:  Body mass index is 17.85 kg/(m^2). Underweight  Estimated Nutritional Needs: Kcal: 1800-2000 Protein:  80-90 gram Fluid: >/=1800 ml/daily  Skin: WDL  Diet Order: General  EDUCATION NEEDS: -Education needs addressed  No intake or output data in the 24 hours ending 09/25/13 1445  Last BM: 9/09   Labs:   Recent Labs Lab 09/24/13 2049  09/24/13 2104 09/25/13 0625  NA 130* 132* 132*  K 3.5* 3.3* 3.1*  CL 94* 100 98  CO2 25  --  26  BUN 13 11 10   CREATININE 0.86 0.90 0.78  CALCIUM 8.9  --  8.2*  MG  --   --  1.5  GLUCOSE 130* 130* 105*    CBG (last 3)   Recent Labs  09/24/13 2028 09/25/13 0146  GLUCAP 127* 120*    Scheduled Meds: . aspirin EC  81 mg Oral QHS  . atorvastatin  20 mg Oral q1800  . azithromycin  500 mg Intravenous Q24H  . cefTRIAXone (ROCEPHIN)  IV  1 g Intravenous Q24H  . gabapentin  600 mg Oral Daily  . guaiFENesin  600 mg Oral BID  . [START ON 09/26/2013] Influenza vac split quadrivalent PF  0.5 mL Intramuscular Tomorrow-1000  . lactose free nutrition  237 mL Oral TID WC  . megestrol  160 mg Oral Daily  . nortriptyline  75 mg Oral QHS  . tamsulosin  0.4 mg Oral Daily    Continuous Infusions: . sodium chloride 75 mL/hr at 09/25/13 0543    Past Medical History  Diagnosis Date  . BENIGN PROSTATIC HYPERTROPHY 12/24/2009  . CEREBROVASCULAR ACCIDENT, HX OF 12/24/2009  . HYPERLIPIDEMIA 12/24/2009  . HYPERTENSION 12/24/2009  . Testosterone deficiency   . ED (erectile dysfunction)   . Claudication in peripheral vascular disease, lifestyle limiting, with abnormal arterial dopplers of lower ext. 06/30/2011  . S/P angioplasty with stent to Lt. SFA 06/30/11 06/30/2011  . COPD 12/24/2009  . Shortness of breath     "from the COPD" (10/13/2011)  . Stroke 1996    "right side not as strong as left since; numbness R leg/foot" (10/13/2011)  . Pernicious anemia   . Complication of anesthesia     hard to wake up  . Pneumonia   . 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 Brain metastases 05/22/13    ct head   . History of radiation therapy 06/02/13-06/16/13    brain    Past Surgical History  Procedure Laterality Date  . Cervical fusion  1980's    "total of 3-4 OR's on my neck; pinched nerve" (10/13/2011)  . Aneurysm coiling  2007  . Peripheral arterial stent graft  06/30/2010; 10/13/10     left; left  . Video bronchoscopy Bilateral 04/03/2012    Procedure: VIDEO BRONCHOSCOPY WITHOUT FLUORO;  Surgeon: Tanda Rockers, MD;  Location: [facility];  Service: Cardiopulmonary;  Laterality: Bilateral;  . Amputation Left 08/26/2012    Procedure: IRRIGATION AND DEBRIDMENT LEFT DORSAL ABSCESS/ 4TH AND 5TH LEFT RAY AMPUTATION ;  Surgeon: Wylene Simmer, MD;  Location: [facility];  Service: Orthopedics;  Laterality: Left;  . Femoral-popliteal bypass graft Left 09/03/2012    Procedure: BYPASS GRAFT FEMORAL-POPLITEAL ARTERY;  Surgeon: Serafina Mitchell, MD;  Location: [place];  Service: Vascular;  Laterality: Left;  . Cardiac catheterization    . Colonoscopy      hx: of  . I&d extremity Left 09/26/2012    Procedure: IRRIGATION AND DEBRIDEMENT EXTREMITY- LEFT FOOT WITH WOUND VAC PLACEMENT;  Surgeon: Serafina Mitchell, MD;  Location: [place];  Service: Vascular;  Laterality: Left;    Atlee Abide MS RD [place] Clinical Dietitian [phone]

## 2013-09-25 NOTE — ED Provider Notes (Signed)
Medical screening examination/treatment/procedure(s) were performed by non-physician practitioner and as supervising physician I was immediately available for consultation/collaboration.   EKG Interpretation None        [place], DO 09/25/13 2314

## 2013-09-25 NOTE — Progress Notes (Addendum)
Progress Note   [name]:[id number] DOB: 12-25-1950 DOA: 09/24/2013 PCP: Nyoka Cowden, MD   Brief Narrative:   LASHAWN [name] is an 63 y.o. male with a PMH of COPD, CVA, hypertension, dyslipidemia and stage IV non-small cell lung cancer/brain metastasis status post systemic chemotherapy and brain irradiation, now under observation who was admitted 09/24/13 with a chief complaint of generalized weakness. Upon initial evaluating an 71, he was found to be hypoxic with a WBC of 14.1 and a CT of the chest showing multifocal pneumonia, right lower lobe predominant.  Assessment/Plan:   Principal Problem:   CAP (community acquired pneumonia)  Multifocal pneumonia confirmed on CT.  Continue supplemental oxygen.  Continue empiric ceftriaxone/azithromycin.  Strep pneumonia negative, legionella antigen pending, blood cultures sputum cultures pending.  Active Problems:   Severe protein calorie malnutrition  Continue Megace and Boost supplements, dietician consulted.     Hypokalemia  We'll give 40 mEq of replacement potassium now and check magnesium.    HYPERTENSION  No antihypertensives listed among home medications. Blood pressure not currently elevated.    Coronary artery disease / history of CVA / PVD  Continue aspirin and Lipitor.    Tobacco abuse  Tobacco cessation counseling per nursing staff.    Non-small cell carcinoma of left lung, stage 4  Currently under observation. We'll notify Dr. Julien Nordmann of the patient's admission.  CT worrisome for disease progression.    DVT Prophylaxis  SCDs ordered. H/O HIT.  Code Status: Full. Family Communication: Updated at the bedside. Disposition Plan: Home when stable.   IV Access:    Peripheral IV   Procedures and diagnostic studies:   Dg Chest 2 View 09/24/2013: Medial left upper lobe nodule, similar to prior CT chest, worrisome for metastatic disease.  Mild patchy bilateral lower lobe opacities,  atelectasis versus pneumonia.     Ct Angio Chest Pe W/cm &/or Wo Cm 09/24/2013: No evidence of pulmonary embolism.  1.7 cm medial left upper lobe nodule, previously 1.3 cm, suspicious for metastasis. Progression of associated thoracic lymphadenopathy. Possible early left adrenal metastasis, indeterminate.  Multifocal pneumonia, right lower lobe predominant.     Medical Consultants:    Dr. Curt Bears, Oncology.   Other Consultants:    None.   Anti-Infectives:    None.  Subjective:   [name] is not complaining of dyspnea, but has a cough productive of white sputum.  Has been losing weight.  Appetite poor per family.    Objective:    Filed Vitals:   09/24/13 2204 09/25/13 0000 09/25/13 0101 09/25/13 0627  BP: 112/63 119/65 131/73 120/66  Pulse: 83 81 87 74  Temp:  97.7 F (36.5 C) 97.4 F (36.3 C) 97.3 F (36.3 C)  TempSrc:  Oral Oral Oral  Resp: 15 17 19 18   Height:   5\' 7"  (1.702 m)   Weight:   51.7 kg (113 lb 15.7 oz)   SpO2: 99% 95% 100% 95%   No intake or output data in the 24 hours ending 09/25/13 0815  Exam: Gen:  NAD Cardiovascular:  RRR, No M/R/G Respiratory:  Lungs diminished Gastrointestinal:  Abdomen soft, NT/ND, + BS Extremities:  No C/E/C   Data Reviewed:    Labs: Basic Metabolic Panel:  Recent Labs Lab 09/24/13 2049 09/24/13 2104 09/25/13 0625  NA 130* 132* 132*  K 3.5* 3.3* 3.1*  CL 94* 100 98  CO2 25  --  26  GLUCOSE 130* 130* 105*  BUN 13 11  10  CREATININE 0.86 0.90 0.78  CALCIUM 8.9  --  8.2*   GFR Estimated Creatinine Clearance: 68.2 ml/min (by C-G formula based on Cr of 0.78). Liver Function Tests:  Recent Labs Lab 09/25/13 0625  AST 7  ALT <5  ALKPHOS 101  BILITOT 0.4  PROT 5.6*  ALBUMIN 2.3*   Coagulation profile  Recent Labs Lab 09/25/13 0625  INR 1.18    CBC:  Recent Labs Lab 09/24/13 2049 09/24/13 2104 09/25/13 0625  WBC 14.1*  --  7.5  NEUTROABS 12.0*  --  5.1  HGB 12.0* 13.6  10.5*  HCT 35.4* 40.0 32.0*  MCV 88.9  --  92.0  PLT 300  --  282   CBG:  Recent Labs Lab 09/24/13 2028 09/25/13 0146  GLUCAP 127* 120*   Sepsis Labs:  Recent Labs Lab 09/24/13 2049 09/24/13 2106 09/25/13 0625  WBC 14.1*  --  7.5  LATICACIDVEN  --  1.10  --    Microbiology No results found for this or any previous visit (from the past 240 hour(s)).   Medications:   . aspirin EC  81 mg Oral QHS  . atorvastatin  20 mg Oral q1800  . azithromycin  500 mg Intravenous Q24H  . cefTRIAXone (ROCEPHIN)  IV  1 g Intravenous Q24H  . gabapentin  600 mg Oral Daily  . guaiFENesin  600 mg Oral BID  . [START ON 09/26/2013] Influenza vac split quadrivalent PF  0.5 mL Intramuscular Tomorrow-1000  . megestrol  160 mg Oral Daily  . nortriptyline  75 mg Oral QHS  . tamsulosin  0.4 mg Oral Daily   Continuous Infusions: . sodium chloride 75 mL/hr at 09/25/13 0543    Time spent: 35 minutes with > 50% of time discussing current diagnostic test results, clinical impression and plan of care.    LOS: 1 day   RAMA,CHRISTINA  [facility] Pager [phone]. If unable to reach me by pager, please call my cell phone at [phone].  *Please refer to [email or web address], password [email or web address] to get updated schedule on who will round on this patient, as hospitalists switch teams weekly. If 7PM-7AM, please contact night-coverage at [email or web address], password [email or web address] for any overnight needs.  09/25/2013, 8:15 AM

## 2013-09-25 NOTE — H&P (Signed)
[facility] History and Physical  Patient: [name]  HYQ:[id number]  DOB: 1950/12/26  DOS: the patient was seen and examined on 09/25/2013 PCP: Nyoka Cowden, MD  Chief Complaint: Generalized weakness  HPI: [name] is a 63 y.o. 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 The patient presented with complaints of generalized weakness that has been ongoing since last few weeks progressively worsening over last few days. He mentions he has tiredness with shortness of breath. Denies any worsening of his chronic cough has whitish expectoration. Denies any fever or chills denies any nausea or vomiting but does have significantly poor appetite and has lost significant weight. Denies any rash anywhere denies any abdominal pain. Denies any diarrhea or constipation or active bleeding. Denies any travel anywhere. Had sick contact with granddaughter being sick with cough for last 2 or 3 days.  The patient is coming from home. And at his baseline independent for most of his ADL.  Review of Systems: as mentioned in the history of present illness.  A Comprehensive review of the other systems is negative.  Past Medical History  Diagnosis Date  . BENIGN PROSTATIC HYPERTROPHY 12/24/2009  . CEREBROVASCULAR ACCIDENT, HX OF 12/24/2009  . HYPERLIPIDEMIA 12/24/2009  . HYPERTENSION 12/24/2009  . Testosterone deficiency   . ED (erectile dysfunction)   . Claudication in peripheral vascular disease, lifestyle limiting, with abnormal arterial dopplers of lower ext. 06/30/2011  . S/P angioplasty with stent to Lt. SFA 06/30/11 06/30/2011  . COPD 12/24/2009  . Shortness of breath     "from the COPD" (10/13/2011)  . Stroke 1996    "right side not as strong as left since; numbness R leg/foot" (10/13/2011)  . Pernicious anemia   . Complication of anesthesia     hard to wake up  . Pneumonia   .  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 Brain metastases 05/22/13    ct head   . History of radiation therapy 06/02/13-06/16/13    brain   Past Surgical History  Procedure Laterality Date  . Cervical fusion  1980's    "total of 3-4 OR's on my neck; pinched nerve" (10/13/2011)  . Aneurysm coiling  2007  . Peripheral arterial stent graft  06/30/2010; 10/13/10    left; left  . Video bronchoscopy Bilateral 04/03/2012    Procedure: VIDEO BRONCHOSCOPY WITHOUT FLUORO;  Surgeon: Tanda Rockers, MD;  Location: [facility];  Service: Cardiopulmonary;  Laterality: Bilateral;  . Amputation Left 08/26/2012    Procedure: IRRIGATION AND DEBRIDMENT LEFT DORSAL ABSCESS/ 4TH AND 5TH LEFT RAY AMPUTATION ;  Surgeon: Wylene Simmer, MD;  Location: [facility];  Service: Orthopedics;  Laterality: Left;  . Femoral-popliteal bypass graft Left 09/03/2012    Procedure: BYPASS GRAFT FEMORAL-POPLITEAL ARTERY;  Surgeon: Serafina Mitchell, MD;  Location: [facility];  Service: Vascular;  Laterality: Left;  . Cardiac catheterization    . Colonoscopy      hx: of  . I&d extremity Left 09/26/2012    Procedure: IRRIGATION AND DEBRIDEMENT EXTREMITY- LEFT FOOT WITH WOUND VAC PLACEMENT;  Surgeon: Serafina Mitchell, MD;  Location: [facility];  Service: Vascular;  Laterality: Left;   Social History:  reports that he has been smoking Cigarettes.  He has a 33.75 pack-year smoking history. He has never used smokeless tobacco. He reports that he does not drink alcohol or use illicit drugs.  No Known Allergies  Family History  Problem Relation Age of Onset  . Emphysema Father     smoked  . Emphysema Paternal Uncle     smoked  . Heart disease Mother   . Lung cancer Father     smoked  . Lung cancer Paternal Grandfather     smoked    Prior to Admission medications   Medication Sig Start Date End Date Taking? Authorizing Provider  aspirin 81 MG tablet Take 81 mg by mouth at bedtime.    Yes Historical Provider, MD  atorvastatin  (LIPITOR) 20 MG tablet Take 1 tablet (20 mg total) by mouth daily. 06/20/13  Yes Marletta Lor, MD  gabapentin (NEURONTIN) 600 MG tablet Take 1 tablet (600 mg total) by mouth every morning. 06/20/13  Yes Marletta Lor, MD  nortriptyline (PAMELOR) 75 MG capsule Take 75 mg by mouth at bedtime.   Yes Historical Provider, MD  tamsulosin (FLOMAX) 0.4 MG CAPS capsule Take 1 capsule (0.4 mg total) by mouth daily. 06/20/13  Yes Marletta Lor, MD    Physical Exam: Filed Vitals:   09/24/13 2059 09/24/13 2204 09/25/13 0000 09/25/13 0101  BP: 90/55 112/63 119/65 131/73  Pulse: 94 83 81 87  Temp: 98 F (36.7 C)  97.7 F (36.5 C) 97.4 F (36.3 C)  TempSrc: Oral  Oral Oral  Resp: 16 15 17 19   Height:    5\' 7"  (1.702 m)  Weight:    51.7 kg (113 lb 15.7 oz)  SpO2: 92% 99% 95% 100%    General: Alert, Awake and Oriented to Time, Place and Person. Appear in mild distress Eyes: PERRL ENT: Oral Mucosa clear moist. Neck: no JVD Cardiovascular: S1 and S2 Present, aortic systolic Murmur, Peripheral Pulses Present Respiratory: Bilateral Air entry equal and Decreased, faint basal Crackles, no wheezes Abdomen: Bowel Sound Present, Soft and Non tender Skin: no Rash Extremities: no Pedal edema, no calf tenderness Neurologic: Grossly no focal neuro deficit. Labs on Admission:  CBC:  Recent Labs Lab 09/24/13 2049 09/24/13 2104  WBC 14.1*  --   NEUTROABS 12.0*  --   HGB 12.0* 13.6  HCT 35.4* 40.0  MCV 88.9  --   PLT 300  --     CMP     Component Value Date/Time   NA 132* 09/24/2013 2104   NA 131* 06/19/2013 1248   K 3.3* 09/24/2013 2104   K 3.3* 06/19/2013 1248   CL 100 09/24/2013 2104   CL 97* 06/13/2012 1300   CO2 25 09/24/2013 2049   CO2 25 06/19/2013 1248   GLUCOSE 130* 09/24/2013 2104   GLUCOSE 68* 06/19/2013 1248   GLUCOSE 123* 06/13/2012 1300   BUN 11 09/24/2013 2104   BUN 7.2 08/29/2013 0817   CREATININE 0.90 09/24/2013 2104   CREATININE 0.8 08/29/2013 0817   CREATININE 0.65 03/03/2013 0908    CALCIUM 8.9 09/24/2013 2049   CALCIUM 8.8 06/19/2013 1248   PROT 6.1* 06/19/2013 1248   PROT 7.6 05/22/2013 1315   ALBUMIN 2.8* 06/19/2013 1248   ALBUMIN 3.9 05/22/2013 1315   AST 22 06/19/2013 1248   AST 13 05/22/2013 1315   ALT 35 06/19/2013 1248   ALT 9 05/22/2013 1315   ALKPHOS 80 06/19/2013 1248   ALKPHOS 99 05/22/2013 1315   BILITOT 0.63 06/19/2013 1248   BILITOT 0.7 05/22/2013 1315   GFRNONAA 90* 09/24/2013 2049   GFRNONAA >89 03/03/2013 0908   GFRAA >90 09/24/2013 2049   GFRAA >89 03/03/2013 0908    No  results found for this basename: LIPASE, AMYLASE,  in the last 168 hours No results found for this basename: AMMONIA,  in the last 168 hours  No results found for this basename: CKTOTAL, CKMB, CKMBINDEX, TROPONINI,  in the last 168 hours BNP (last 3 results) No results found for this basename: PROBNP,  in the last 8760 hours  Radiological Exams on Admission: Dg Chest 2 View  09/24/2013   CLINICAL DATA:  Shortness of breath, weakness, history of lung cancer  EXAM: CHEST  2 VIEW  COMPARISON:  CT chest dated 06/19/2013  FINDINGS: Chronic interstitial markings. Medial left upper lobe nodule, similar to prior CT chest, worrisome for metastatic disease. Mild patchy bilateral lower lobe opacities, atelectasis versus pneumonia. No pleural effusion or pneumothorax.  Heart is normal in size.  Degenerative changes of the visualized thoracolumbar spine.  IMPRESSION: Medial left upper lobe nodule, similar to prior CT chest, worrisome for metastatic disease.  Mild patchy bilateral lower lobe opacities, atelectasis versus pneumonia.   Electronically Signed   By: Julian Hy M.D.   On: 09/24/2013 21:33   Ct Angio Chest Pe W/cm &/or Wo Cm  09/24/2013   CLINICAL DATA:  Lung cancer with brain metastases, pneumonia, evaluate for PE  EXAM: CT ANGIOGRAPHY CHEST WITH CONTRAST  TECHNIQUE: Multidetector CT imaging of the chest was performed using the standard protocol during bolus administration of intravenous contrast.  Multiplanar CT image reconstructions and MIPs were obtained to evaluate the vascular anatomy.  CONTRAST:  152mL OMNIPAQUE IOHEXOL 350 MG/ML SOLN  COMPARISON:  CT chest dated 06/19/2013  FINDINGS: No evidence of pulmonary embolism.  1.4 x 1.7 cm medial left upper lobe nodule (series 12/image 34), previously 1.3 x 1.0 cm, suspicious for metastasis. Additional clustered satellite nodularity in the left upper lobe (series 12/image 28).  Multifocal patchy opacity predominantly in the right lower lobe (series 12/image 90), and to a lesser extent in the right middle lobe (series 12/ image 69) and left lower lobe (series 12/ image 86), suspicious for multifocal pneumonia.  Underlying mild centrilobular emphysematous changes. No pleural effusion or pneumothorax.  Visualized thyroid is unremarkable.  The heart is normal in size. No pericardial effusion. Coronary atherosclerosis. Atherosclerotic calcifications of the aortic arch.  Progression of thoracic lymphadenopathy, including:  --16 mm short axis left axillary node (series 5/ image 29), previously 11 mm  --9 mm short axis subcarinal node (series 5/ image 55), previously 8 mm  --7 mm short axis right hilar node (series 5/image 56), new  Visualized upper abdomen is notable for mild nodular thickening of the left adrenal gland (series 5/image 104), indeterminate, early metastasis not excluded.  Degenerative changes of the visualized thoracolumbar spine. Exaggerated thoracic kyphosis. Cervical spine fixation hardware.  Review of the MIP images confirms the above findings.  IMPRESSION: No evidence of pulmonary embolism.  1.7 cm medial left upper lobe nodule, previously 1.3 cm, suspicious for metastasis. Progression of associated thoracic lymphadenopathy. Possible early left adrenal metastasis, indeterminate.  Multifocal pneumonia, right lower lobe predominant.   Electronically Signed   By: Julian Hy M.D.   On: 09/24/2013 23:07    Assessment/Plan Principal  Problem:   CAP (community acquired pneumonia) Active Problems:   HYPERTENSION   Tobacco abuse   S/P angioplasty with stent to Lt. 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 CAP (community acquired pneumonia) The patient is presenting with complaints of generalized weakness for further workup is found to have leukocytosis and hypotension. He also had initially tachycardia and hypoxia. Chest x-ray was suggestive of basilar atelectasis. A CT scan was performed which ruled out pulmonary embolism but showed multifocal pneumonia. Patient is on 2 L of oxygen and comfortable. At present I would treat as community-acquired pneumonia as he is not on any immunosuppressive chemotherapy recently as well as has not had in his hospital contact recently. I will give him IV ceftriaxone azithromycin. Follow cultures.  2. Peripheral vascular disease. Patient has history of peripheral vascular disease continue with aspirin and Lipitor.  3. Poor oral appetite I will start the patient to me is 160 mg daily which can be increased later on.  4. Non-small cell lung cancer. At present continue monitoring  DVT Prophylaxis: mechanical compression device Nutrition: Regular diet  Code Status: Full  Family Communication: Family was present at bedside, opportunity was given to ask question and all questions were answered satisfactorily at the time of interview. Disposition: Admitted to inpatient in telemetry unit.  Author: Berle Mull, MD [facility] Pager: [phone] 09/25/2013, 5:46 AM    If 7PM-7AM, please contact night-coverage [email or web address] Password [email or web address]  **Disclaimer: This note may have been dictated with voice recognition software. Similar sounding words can inadvertently be transcribed and this note may contain transcription errors which may not have  been corrected upon publication of note.**

## 2013-09-26 DIAGNOSIS — E876 Hypokalemia: Secondary | ICD-10-CM

## 2013-09-26 DIAGNOSIS — J189 Pneumonia, unspecified organism: Secondary | ICD-10-CM | POA: Diagnosis not present

## 2013-09-26 DIAGNOSIS — C3410 Malignant neoplasm of upper lobe, unspecified bronchus or lung: Secondary | ICD-10-CM

## 2013-09-26 DIAGNOSIS — E43 Unspecified severe protein-calorie malnutrition: Secondary | ICD-10-CM

## 2013-09-26 DIAGNOSIS — J449 Chronic obstructive pulmonary disease, unspecified: Secondary | ICD-10-CM

## 2013-09-26 LAB — BASIC METABOLIC PANEL
ANION GAP: 10 (ref 5–15)
BUN: 6 mg/dL (ref 6–23)
CALCIUM: 8.4 mg/dL (ref 8.4–10.5)
CO2: 25 mEq/L (ref 19–32)
Chloride: 102 mEq/L (ref 96–112)
Creatinine, Ser: 0.69 mg/dL (ref 0.50–1.35)
GLUCOSE: 93 mg/dL (ref 70–99)
POTASSIUM: 3.4 meq/L — AB (ref 3.7–5.3)
SODIUM: 137 meq/L (ref 137–147)

## 2013-09-26 LAB — LEGIONELLA ANTIGEN, URINE: Legionella Antigen, Urine: NEGATIVE

## 2013-09-26 MED ORDER — POTASSIUM CHLORIDE IN NACL 20-0.9 MEQ/L-% IV SOLN
INTRAVENOUS | Status: DC
  Administered 2013-09-26 – 2013-09-27 (×2): via INTRAVENOUS
  Filled 2013-09-26 (×3): qty 1000

## 2013-09-26 NOTE — Progress Notes (Signed)
Progress Note   [name] DOB: 03-21-1950 DOA: 09/24/2013 PCP: Nyoka Cowden, MD   Brief Narrative:   [name] is an 63 y.o. male with a PMH of COPD, CVA, hypertension, dyslipidemia and stage IV non-small cell lung cancer/brain metastasis status post systemic chemotherapy and brain irradiation, now under observation who was admitted 09/24/13 with a chief complaint of generalized weakness. Upon initial evaluating an 71, he was found to be hypoxic with a WBC of 14.1 and a CT of the chest showing multifocal pneumonia, right lower lobe predominant.  Assessment/Plan:   Principal Problem:   CAP (community acquired pneumonia)  Multifocal pneumonia confirmed on CT.  Continue supplemental oxygen.  Continue empiric ceftriaxone/azithromycin.  Strep pneumonia negative, legionella antigen pending, blood cultures sputum cultures pending.  Active Problems:   Severe protein calorie malnutrition / Underweight  Pt meets criteria for severe MALNUTRITION in the context of chronic illness as evidenced by PO intake < 75% for > one month, 8% body weight loss in one month, severe muscle wasting and subcutaneous fat loss.  Continue Megace and Boost supplements.     Hypokalemia  Supplemented. Mg 1.5. Add K+ to IVF.    HYPERTENSION  No antihypertensives listed among home medications. Blood pressure not currently elevated.    Coronary artery disease / history of CVA / PVD  Continue aspirin and Lipitor.    Tobacco abuse  Tobacco cessation counseling per nursing staff.    Non-small cell carcinoma of left lung, stage 4  Currently under observation.  Dr. Julien Nordmann consulting.  CT worrisome for disease progression which was discussed with the patient.    DVT Prophylaxis  SCDs ordered. H/O HIT.  Code Status: Full. Family Communication: Updated at the bedside. Disposition Plan: Home when stable.   IV Access:    Peripheral IV   Procedures and  diagnostic studies:   Dg Chest 2 View 09/24/2013: Medial left upper lobe nodule, similar to prior CT chest, worrisome for metastatic disease.  Mild patchy bilateral lower lobe opacities, atelectasis versus pneumonia.     Ct Angio Chest Pe W/cm &/or Wo Cm 09/24/2013: No evidence of pulmonary embolism.  1.7 cm medial left upper lobe nodule, previously 1.3 cm, suspicious for metastasis. Progression of associated thoracic lymphadenopathy. Possible early left adrenal metastasis, indeterminate.  Multifocal pneumonia, right lower lobe predominant.     Medical Consultants:    Dr. Curt Bears, Oncology.   Other Consultants:    None.   Anti-Infectives:    None.  Subjective:   [name] is not complaining of dyspnea, but continues to have a cough productive of white sputum.  No complaints of pain.  Appetite fair  Objective:    Filed Vitals:   09/25/13 0627 09/25/13 1432 09/25/13 2318 09/26/13 0452  BP: 120/66 117/76 115/90 125/62  Pulse: 74 77 96 97  Temp: 97.3 F (36.3 C) 97.4 F (36.3 C) 97.5 F (36.4 C) 98.1 F (36.7 C)  TempSrc: Oral Oral Oral Oral  Resp: 18 16 18 15   Height:      Weight:      SpO2: 95% 94% 95% 98%    Intake/Output Summary (Last 24 hours) at 09/26/13 1314 Last data filed at 09/26/13 1259  Gross per 24 hour  Intake 3603.75 ml  Output      0 ml  Net 3603.75 ml    Exam: Gen:  NAD Cardiovascular:  RRR, No M/R/G Respiratory:  Lungs diminished Gastrointestinal:  Abdomen soft, NT/ND, + BS  Extremities:  No C/E/C   Data Reviewed:    Labs: Basic Metabolic Panel:  Recent Labs Lab 09/24/13 2049 09/24/13 2104 09/25/13 0625 09/26/13 0915  NA 130* 132* 132* 137  K 3.5* 3.3* 3.1* 3.4*  CL 94* 100 98 102  CO2 25  --  26 25  GLUCOSE 130* 130* 105* 93  BUN 13 11 10 6   CREATININE 0.86 0.90 0.78 0.69  CALCIUM 8.9  --  8.2* 8.4  MG  --   --  1.5  --    GFR Estimated Creatinine Clearance: 68.2 ml/min (by C-G formula based on Cr of  0.69). Liver Function Tests:  Recent Labs Lab 09/25/13 0625  AST 7  ALT <5  ALKPHOS 101  BILITOT 0.4  PROT 5.6*  ALBUMIN 2.3*   Coagulation profile  Recent Labs Lab 09/25/13 0625  INR 1.18    CBC:  Recent Labs Lab 09/24/13 2049 09/24/13 2104 09/25/13 0625  WBC 14.1*  --  7.5  NEUTROABS 12.0*  --  5.1  HGB 12.0* 13.6 10.5*  HCT 35.4* 40.0 32.0*  MCV 88.9  --  92.0  PLT 300  --  282   CBG:  Recent Labs Lab 09/24/13 2028 09/25/13 0146  GLUCAP 127* 120*   Sepsis Labs:  Recent Labs Lab 09/24/13 2049 09/24/13 2106 09/25/13 0625  WBC 14.1*  --  7.5  LATICACIDVEN  --  1.10  --    Microbiology Recent Results (from the past 240 hour(s))  CULTURE, BLOOD (ROUTINE X 2)     Status: None   Collection Time    09/25/13  1:20 AM      Result Value Ref Range Status   Specimen Description BLOOD RIGHT ANTECUBITAL   Final   Special Requests BOTTLES DRAWN AEROBIC ONLY 5CC   Final   Culture  Setup Time     Final   Value: 09/25/2013 09:00     Performed at [facility]   Culture     Final   Value:        BLOOD CULTURE RECEIVED NO GROWTH TO DATE CULTURE WILL BE HELD FOR 5 DAYS BEFORE ISSUING A FINAL NEGATIVE REPORT     Performed at [facility]   Report Status PENDING   Incomplete  CULTURE, BLOOD (ROUTINE X 2)     Status: None   Collection Time    09/25/13  1:23 AM      Result Value Ref Range Status   Specimen Description BLOOD BLOOD RIGHT FOREARM   Final   Special Requests BOTTLES DRAWN AEROBIC ONLY 5CC   Final   Culture  Setup Time     Final   Value: 09/25/2013 08:59     Performed at [facility]   Culture     Final   Value:        BLOOD CULTURE RECEIVED NO GROWTH TO DATE CULTURE WILL BE HELD FOR 5 DAYS BEFORE ISSUING A FINAL NEGATIVE REPORT     Performed at [facility]   Report Status PENDING   Incomplete     Medications:   . aspirin EC  81 mg Oral QHS  . atorvastatin  20 mg Oral q1800  . azithromycin  500 mg Intravenous  Q24H  . cefTRIAXone (ROCEPHIN)  IV  1 g Intravenous Q24H  . gabapentin  600 mg Oral Daily  . guaiFENesin  600 mg Oral BID  . lactose free nutrition  237 mL Oral TID WC  . megestrol  160 mg Oral Daily  . nortriptyline  75 mg Oral QHS  . tamsulosin  0.4 mg Oral Daily   Continuous Infusions: . sodium chloride 75 mL/hr at 09/26/13 0901    Time spent: 25 minutes.    LOS: 2 days   RAMA,CHRISTINA  [facility] Pager [phone]. If unable to reach me by pager, please call my cell phone at [phone].  *Please refer to [email or web address], password [email or web address] to get updated schedule on who will round on this patient, as hospitalists switch teams weekly. If 7PM-7AM, please contact night-coverage at [email or web address], password [email or web address] for any overnight needs.  09/26/2013, 1:14 PM

## 2013-09-26 NOTE — Progress Notes (Signed)
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   PRIOR THERAPY:  1) Systemic chemotherapy with carboplatin for an AUC of 5 given on day 1 and Abraxane at 100 mg per meter squared given on days 1, 8 and 15 every 3 weeks. Status post 4 cycles, as well as day 1 of cycle 5.  2) status post whole brain irradiation completed on 06/16/2013 under the care of Dr. Lisbeth Renshaw.  CURRENT THERAPY: Observation.   Subjective: The patient is seen and examined today. He was admitted from the emergency Department at [facility] with chief complaint of generalized weakness that has been going on for few days accompanied by shortness of breath and worsening of his chronic cough. He denied having any fever or chills. CT angiogram of the chest on admission showed no evidence for pulmonary embolus but there was increase in the size of left upper lobe nodule from 1.3x1.0 CM to 1.4x1.7 CM suspicious for metastasis. There was also additional cluster satellite nodularity in the left upper lobe. The patient also had multifocal patchy opacity in the right lower lobe and to a lesser extent in the right middle lobe and left lower lobe suspicious for multifocal pneumonia.the scan also showed progression of thoracic lymphadenopathy including 1.6 CM left axillary nodes and 0.9 CM subcarinal node in addition to in your 0.7 CM right hilar node. He is currently undergoing treatment for pneumonia with Zithromax and Rocephin. The patient is feeling fine today with no specific complaints except for mild shortness of breath and fatigue.  Objective: Vital signs in last 24 hours: Temp:  [97.4 F (36.3 C)-98.1 F (36.7 C)] 98.1 F (36.7 C) (09/11 0452) Pulse Rate:  [77-97] 97 (09/11 0452) Resp:  [15-18] 15 (09/11 0452) BP: (115-125)/(62-90) 125/62 mmHg (09/11 0452) SpO2:  [94 %-98 %] 98 % (09/11 0452)  Intake/Output from previous day: 09/10 0701 - 09/11 0700 In: 3363.8 [P.O.:930;  I.V.:2183.8; IV Piggyback:250] Out: -  Intake/Output this shift: Total I/O In: 240 [P.O.:240] Out: -   General appearance: alert, cooperative, fatigued and no distress Resp: rales bilaterally and wheezes bilaterally Cardio: regular rate and rhythm, S1, S2 normal, no murmur, click, rub or gallop GI: soft, non-tender; bowel sounds normal; no masses,  no organomegaly Extremities: extremities normal, atraumatic, no cyanosis or edema  Lab Results:   Recent Labs  09/24/13 2049 09/24/13 2104 09/25/13 0625  WBC 14.1*  --  7.5  HGB 12.0* 13.6 10.5*  HCT 35.4* 40.0 32.0*  PLT 300  --  282   BMET  Recent Labs  09/25/13 0625 09/26/13 0915  NA 132* 137  K 3.1* 3.4*  CL 98 102  CO2 26 25  GLUCOSE 105* 93  BUN 10 6  CREATININE 0.78 0.69  CALCIUM 8.2* 8.4    Studies/Results: Dg Chest 2 View  09/24/2013   CLINICAL DATA:  Shortness of breath, weakness, history of lung cancer  EXAM: CHEST  2 VIEW  COMPARISON:  CT chest dated 06/19/2013  FINDINGS: Chronic interstitial markings. Medial left upper lobe nodule, similar to prior CT chest, worrisome for metastatic disease. Mild patchy bilateral lower lobe opacities, atelectasis versus pneumonia. No pleural effusion or pneumothorax.  Heart is normal in size.  Degenerative changes of the visualized thoracolumbar spine.  IMPRESSION: Medial left upper lobe nodule, similar to prior CT chest, worrisome for metastatic disease.  Mild patchy bilateral lower lobe opacities, atelectasis versus pneumonia.   Electronically Signed   By: Henderson Newcomer.D.  On: 09/24/2013 21:33   Ct Angio Chest Pe W/cm &/or Wo Cm  09/24/2013   CLINICAL DATA:  Lung cancer with brain metastases, pneumonia, evaluate for PE  EXAM: CT ANGIOGRAPHY CHEST WITH CONTRAST  TECHNIQUE: Multidetector CT imaging of the chest was performed using the standard protocol during bolus administration of intravenous contrast. Multiplanar CT image reconstructions and MIPs were obtained to evaluate  the vascular anatomy.  CONTRAST:  144mL OMNIPAQUE IOHEXOL 350 MG/ML SOLN  COMPARISON:  CT chest dated 06/19/2013  FINDINGS: No evidence of pulmonary embolism.  1.4 x 1.7 cm medial left upper lobe nodule (series 12/image 34), previously 1.3 x 1.0 cm, suspicious for metastasis. Additional clustered satellite nodularity in the left upper lobe (series 12/image 28).  Multifocal patchy opacity predominantly in the right lower lobe (series 12/image 90), and to a lesser extent in the right middle lobe (series 12/ image 69) and left lower lobe (series 12/ image 86), suspicious for multifocal pneumonia.  Underlying mild centrilobular emphysematous changes. No pleural effusion or pneumothorax.  Visualized thyroid is unremarkable.  The heart is normal in size. No pericardial effusion. Coronary atherosclerosis. Atherosclerotic calcifications of the aortic arch.  Progression of thoracic lymphadenopathy, including:  --16 mm short axis left axillary node (series 5/ image 29), previously 11 mm  --9 mm short axis subcarinal node (series 5/ image 55), previously 8 mm  --7 mm short axis right hilar node (series 5/image 56), new  Visualized upper abdomen is notable for mild nodular thickening of the left adrenal gland (series 5/image 104), indeterminate, early metastasis not excluded.  Degenerative changes of the visualized thoracolumbar spine. Exaggerated thoracic kyphosis. Cervical spine fixation hardware.  Review of the MIP images confirms the above findings.  IMPRESSION: No evidence of pulmonary embolism.  1.7 cm medial left upper lobe nodule, previously 1.3 cm, suspicious for metastasis. Progression of associated thoracic lymphadenopathy. Possible early left adrenal metastasis, indeterminate.  Multifocal pneumonia, right lower lobe predominant.   Electronically Signed   By: Julian Hy M.D.   On: 09/24/2013 23:07    Medications: I have reviewed the patient's current medications.   Assessment/Plan: This is a very  pleasant 63 years old white male with   1) known metastatic non-small cell lung cancer diagnosed in March of 2015 status post 5 cycles of systemic chemotherapy with carboplatin and Abraxane and most recently treated with whole brain irradiation for metastatic brain lesions. He has some evidence for disease progression on his recent scan. I discussed the scan results with the patient today and in the setting of acute inflammatory process, I recommended for the patient to repeat another CT scan of the chest in 4-6 weeks for reevaluation of his disease after resolution of the inflammatory process to have a better assessment of his disease progression. I will arrange a followup appointment for him with me in around 6 weeks for a more detailed discussion of his disease and treatment options.  2) community acquired pneumonia: continue current treatment with Rocephin and azithromycin. Thank you so much for taking good care of Mr. Cleary. I will continue to follow the patient with you and assist in his management an as-needed basis.     LOS: 2 days    Tysen Roesler K. 09/26/2013

## 2013-09-27 MED ORDER — MEGESTROL ACETATE 40 MG PO TABS: 160.0000 mg | ORAL_TABLET | Freq: Every day | ORAL | Status: DC

## 2013-09-27 MED ORDER — LEVOFLOXACIN 750 MG PO TABS: 750.0000 mg | ORAL_TABLET | Freq: Every day | ORAL | Status: DC

## 2013-09-27 MED ORDER — GUAIFENESIN ER 600 MG PO TB12: 600.0000 mg | ORAL_TABLET | Freq: Two times a day (BID) | ORAL | Status: DC

## 2013-09-27 MED ORDER — BOOST PLUS PO LIQD: 237.0000 mL | Freq: Three times a day (TID) | ORAL | Status: DC

## 2013-09-27 NOTE — Discharge Instructions (Signed)

## 2013-09-27 NOTE — Discharge Summary (Signed)
Physician Discharge Summary  [name] PFX:[id number] DOB: February 27, 1950 DOA: 09/24/2013  PCP: Nyoka Cowden, MD  Admit date: 09/24/2013 Discharge date: 09/27/2013   Recommendations for Outpatient Follow-Up:   1. F/U with Dr. Julien Nordmann in 5 weeks for repeat CT scan of chest to assess disease progression. 2. F/U final blood cultures.   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  Diet recommendation: Low sodium, heart healthy.    History of Present Illness:   [name] is an 63 y.o. male with a PMH of COPD, CVA, hypertension, dyslipidemia and stage IV non-small cell lung cancer/brain metastasis status post systemic chemotherapy and brain irradiation, now under observation who was admitted 09/24/13 with a chief complaint of generalized weakness. Upon initial evaluating an 3, he was found to be hypoxic with a WBC of 14.1 and a CT of the chest showing multifocal pneumonia, right lower lobe predominant.  Hospital Course by Problem:   Principal Problem:  CAP (community acquired pneumonia)  Multifocal pneumonia confirmed on CT.  Treated with ceftriaxone/azithromycin, D/C home on 7 days of treatment with Levaquin.  Strep pneumonia negative, legionella antigen negative, blood cultures pending.  Active Problems:  Severe protein calorie malnutrition / Underweight  Pt meets criteria for severe MALNUTRITION in the context of chronic illness as evidenced by PO intake < 75% for > one month, 8% body weight loss in one month, severe muscle wasting and subcutaneous fat  loss. Continue Megace and Boost supplements.   Hypokalemia  Supplemented. Mg 1.5. Add K+ to IVF.  HYPERTENSION  No antihypertensives listed among home medications. Blood pressure not currently elevated.  Coronary artery disease / history of CVA / PVD  Continue aspirin and Lipitor.  Tobacco abuse  Tobacco cessation counseling provided per nursing staff.  Non-small cell carcinoma of left lung, stage 4  Currently under observation. Dr. Julien Nordmann consulting.  CT worrisome for disease progression which was discussed with the patient.  Seen by Dr. Julien Nordmann 09/26/13 with recommendations for repeat CT scan of the chest 4-6 weeks after resolution of his pneumonia to further evaluate for disease progression.   Medical Consultants:    Dr. Curt Bears, Oncology.   Discharge Exam:   Filed Vitals:   09/27/13 0448  BP: 127/81  Pulse: 87  Temp: 97.5 F (36.4 C)  Resp: 17   Filed Vitals:   09/26/13 0452 09/26/13 1454 09/26/13 2046 09/27/13 0448  BP: 125/62 124/60 120/86 127/81  Pulse: 97 81 88 87  Temp: 98.1 F (36.7 C) 98.1 F (36.7 C) 98.3 F (36.8 C) 97.5 F (36.4 C)  TempSrc: Oral Oral Oral Oral  Resp: 15 16 16 17   Height:      Weight:      SpO2: 98% 94% 93% 94%    Gen:  NAD Cardiovascular:  RRR, No M/R/G Respiratory: Lungs diminished Gastrointestinal: Abdomen soft, NT/ND with normal active bowel sounds. Extremities: No C/E/C   The results of significant diagnostics from this hospitalization (including imaging, microbiology, ancillary and laboratory) are listed below for reference.  Procedures and Diagnostic Studies:   Dg Chest 2 View 09/24/2013: Medial left upper lobe nodule, similar to prior CT chest, worrisome for metastatic disease. Mild patchy bilateral lower lobe opacities, atelectasis versus pneumonia.   Ct Angio Chest Pe W/cm &/or Wo Cm 09/24/2013: No evidence of pulmonary embolism. 1.7 cm medial left upper lobe nodule, previously 1.3 cm, suspicious for  metastasis. Progression of associated thoracic lymphadenopathy. Possible early left adrenal metastasis, indeterminate. Multifocal pneumonia, right lower lobe predominant.   Labs:   Basic Metabolic Panel:  Recent Labs Lab 09/24/13 2049 09/24/13 2104 09/25/13 0625 09/26/13 0915  NA 130* 132* 132* 137  K 3.5* 3.3* 3.1* 3.4*  CL 94* 100 98 102  CO2 25  --  26 25  GLUCOSE 130* 130* 105* 93  BUN 13 11 10 6   CREATININE 0.86 0.90 0.78 0.69  CALCIUM 8.9  --  8.2* 8.4  MG  --   --  1.5  --    GFR Estimated Creatinine Clearance: 68.2 ml/min (by C-G formula based on Cr of 0.69). Liver Function Tests:  Recent Labs Lab 09/25/13 0625  AST 7  ALT <5  ALKPHOS 101  BILITOT 0.4  PROT 5.6*  ALBUMIN 2.3*   Coagulation profile  Recent Labs Lab 09/25/13 0625  INR 1.18    CBC:  Recent Labs Lab 09/24/13 2049 09/24/13 2104 09/25/13 0625  WBC 14.1*  --  7.5  NEUTROABS 12.0*  --  5.1  HGB 12.0* 13.6 10.5*  HCT 35.4* 40.0 32.0*  MCV 88.9  --  92.0  PLT 300  --  282   CBG:  Recent Labs Lab 09/24/13 2028 09/25/13 0146  GLUCAP 127* 120*   Microbiology Recent Results (from the past 240 hour(s))  CULTURE, BLOOD (ROUTINE X 2)     Status: None   Collection Time    09/25/13  1:20 AM      Result Value Ref Range Status   Specimen Description BLOOD RIGHT ANTECUBITAL   Final   Special Requests BOTTLES DRAWN AEROBIC ONLY 5CC   Final   Culture  Setup Time     Final   Value: 09/25/2013 09:00     Performed at [facility]   Culture     Final   Value:        BLOOD CULTURE RECEIVED NO GROWTH TO DATE CULTURE WILL BE HELD FOR 5 DAYS BEFORE ISSUING A FINAL NEGATIVE REPORT     Performed at [facility]   Report Status PENDING   Incomplete  CULTURE, BLOOD (ROUTINE X 2)     Status: None   Collection Time    09/25/13  1:23 AM      Result Value Ref Range Status   Specimen Description BLOOD BLOOD RIGHT FOREARM   Final   Special Requests BOTTLES DRAWN AEROBIC ONLY 5CC    Final   Culture  Setup Time     Final   Value: 09/25/2013 08:59     Performed at [facility]   Culture     Final   Value:        BLOOD CULTURE RECEIVED NO GROWTH TO DATE CULTURE WILL BE HELD FOR 5 DAYS BEFORE ISSUING A FINAL NEGATIVE REPORT     Performed at [facility]   Report Status PENDING   Incomplete     Discharge Instructions:   Discharge Instructions   Call MD for:  difficulty breathing, headache or visual disturbances    Complete by:  As directed  Call MD for:  extreme fatigue    Complete by:  As directed      Call MD for:  severe uncontrolled pain    Complete by:  As directed      Call MD for:  temperature >100.4    Complete by:  As directed      Diet general    Complete by:  As directed      Discharge instructions    Complete by:  As directed   You were treated for pneumonia while you were in the hospital.  It is important that you see your PCP in follow up, and have him/her order a follow up chest x-ray in 4-6 weeks to ensure resolution of the pneumonia and to exclude any underlying pathology.  Take all of your antibiotics, as prescribed, even if you feel better.  Do not discontinue antibiotics prematurely.     Increase activity slowly    Complete by:  As directed             Medication List         aspirin 81 MG tablet  Take 81 mg by mouth at bedtime.     atorvastatin 20 MG tablet  Commonly known as:  LIPITOR  Take 1 tablet (20 mg total) by mouth daily.     gabapentin 600 MG tablet  Commonly known as:  NEURONTIN  Take 1 tablet (600 mg total) by mouth every morning.     guaiFENesin 600 MG 12 hr tablet  Commonly known as:  MUCINEX  Take 1 tablet (600 mg total) by mouth 2 (two) times daily.     lactose free nutrition Liqd  Take 237 mLs by mouth 3 (three) times daily with meals.     levofloxacin 750 MG tablet  Commonly known as:  LEVAQUIN  Take 1 tablet (750 mg total) by mouth daily.     megestrol 40 MG tablet  Commonly known  as:  MEGACE  Take 4 tablets (160 mg total) by mouth daily.     PAMELOR 75 MG capsule  Generic drug:  nortriptyline  Take 75 mg by mouth at bedtime.     tamsulosin 0.4 MG Caps capsule  Commonly known as:  FLOMAX  Take 1 capsule (0.4 mg total) by mouth daily.           Follow-up Information   Follow up with Nyoka Cowden, MD. Schedule an appointment as soon as possible for a visit in 2 weeks.   Specialty:  Internal Medicine   Contact information:   [place] [address] [phone]       Follow up with Eilleen Kempf., MD. (Will call with an appt.)    Specialty:  Oncology   Contact information:   [place] [address] [phone]        Time coordinating discharge: 35 minutes.  Signed:  Falesha Schommer  Pager [phone] [facility] 09/27/2013, 2:26 PM

## 2013-09-30 ENCOUNTER — Other Ambulatory Visit: Payer: Self-pay | Admitting: *Deleted

## 2013-09-30 DIAGNOSIS — C7931 Secondary malignant neoplasm of brain: Secondary | ICD-10-CM

## 2013-09-30 DIAGNOSIS — C7949 Secondary malignant neoplasm of other parts of nervous system: Principal | ICD-10-CM

## 2013-10-01 LAB — CULTURE, BLOOD (ROUTINE X 2)
Culture: NO GROWTH
Culture: NO GROWTH

## 2013-10-02 ENCOUNTER — Ambulatory Visit (HOSPITAL_COMMUNITY): Payer: Medicare Other

## 2013-10-02 ENCOUNTER — Other Ambulatory Visit: Payer: Medicare Other

## 2013-10-03 ENCOUNTER — Encounter (HOSPITAL_COMMUNITY): Payer: Self-pay | Admitting: Emergency Medicine

## 2013-10-03 ENCOUNTER — Encounter (HOSPITAL_BASED_OUTPATIENT_CLINIC_OR_DEPARTMENT_OTHER): Payer: Self-pay | Admitting: Emergency Medicine

## 2013-10-03 ENCOUNTER — Emergency Department (HOSPITAL_BASED_OUTPATIENT_CLINIC_OR_DEPARTMENT_OTHER)
Admission: EM | Admit: 2013-10-03 | Discharge: 2013-10-03 | Disposition: A | Discharge status: Home / Self Care | Payer: Medicare Other | Attending: Emergency Medicine | Admitting: Emergency Medicine

## 2013-10-03 ENCOUNTER — Telehealth: Payer: Self-pay | Admitting: Internal Medicine

## 2013-10-03 ENCOUNTER — Emergency Department (HOSPITAL_COMMUNITY)
Admission: EM | Admit: 2013-10-03 | Discharge: 2013-10-03 | Discharge status: Against Medical Advice | Payer: Medicare Other | Source: Home / Self Care

## 2013-10-03 DIAGNOSIS — Z792 Long term (current) use of antibiotics: Secondary | ICD-10-CM | POA: Insufficient documentation

## 2013-10-03 DIAGNOSIS — F17200 Nicotine dependence, unspecified, uncomplicated: Secondary | ICD-10-CM | POA: Diagnosis not present

## 2013-10-03 DIAGNOSIS — Z7982 Long term (current) use of aspirin: Secondary | ICD-10-CM | POA: Insufficient documentation

## 2013-10-03 DIAGNOSIS — R1031 Right lower quadrant pain: Secondary | ICD-10-CM

## 2013-10-03 DIAGNOSIS — Z9889 Other specified postprocedural states: Secondary | ICD-10-CM | POA: Diagnosis not present

## 2013-10-03 DIAGNOSIS — Z79899 Other long term (current) drug therapy: Secondary | ICD-10-CM | POA: Insufficient documentation

## 2013-10-03 DIAGNOSIS — Z923 Personal history of irradiation: Secondary | ICD-10-CM | POA: Insufficient documentation

## 2013-10-03 DIAGNOSIS — R1110 Vomiting, unspecified: Secondary | ICD-10-CM | POA: Insufficient documentation

## 2013-10-03 DIAGNOSIS — I10 Essential (primary) hypertension: Secondary | ICD-10-CM | POA: Insufficient documentation

## 2013-10-03 DIAGNOSIS — E785 Hyperlipidemia, unspecified: Secondary | ICD-10-CM | POA: Insufficient documentation

## 2013-10-03 DIAGNOSIS — R112 Nausea with vomiting, unspecified: Secondary | ICD-10-CM | POA: Diagnosis present

## 2013-10-03 DIAGNOSIS — J449 Chronic obstructive pulmonary disease, unspecified: Secondary | ICD-10-CM

## 2013-10-03 DIAGNOSIS — N400 Enlarged prostate without lower urinary tract symptoms: Secondary | ICD-10-CM | POA: Diagnosis not present

## 2013-10-03 DIAGNOSIS — Z87448 Personal history of other diseases of urinary system: Secondary | ICD-10-CM | POA: Insufficient documentation

## 2013-10-03 DIAGNOSIS — Z85841 Personal history of malignant neoplasm of brain: Secondary | ICD-10-CM | POA: Diagnosis not present

## 2013-10-03 DIAGNOSIS — Z8701 Personal history of pneumonia (recurrent): Secondary | ICD-10-CM | POA: Insufficient documentation

## 2013-10-03 DIAGNOSIS — J441 Chronic obstructive pulmonary disease with (acute) exacerbation: Secondary | ICD-10-CM | POA: Diagnosis not present

## 2013-10-03 DIAGNOSIS — Z862 Personal history of diseases of the blood and blood-forming organs and certain disorders involving the immune mechanism: Secondary | ICD-10-CM | POA: Diagnosis not present

## 2013-10-03 DIAGNOSIS — J4489 Other specified chronic obstructive pulmonary disease: Secondary | ICD-10-CM | POA: Insufficient documentation

## 2013-10-03 DIAGNOSIS — Z85118 Personal history of other malignant neoplasm of bronchus and lung: Secondary | ICD-10-CM | POA: Insufficient documentation

## 2013-10-03 DIAGNOSIS — Z8673 Personal history of transient ischemic attack (TIA), and cerebral infarction without residual deficits: Secondary | ICD-10-CM | POA: Insufficient documentation

## 2013-10-03 LAB — COMPREHENSIVE METABOLIC PANEL
ALK PHOS: 104 U/L (ref 39–117)
ALT: 7 U/L (ref 0–53)
AST: 14 U/L (ref 0–37)
Albumin: 3.4 g/dL — ABNORMAL LOW (ref 3.5–5.2)
Anion gap: 16 — ABNORMAL HIGH (ref 5–15)
BUN: 11 mg/dL (ref 6–23)
CALCIUM: 9.5 mg/dL (ref 8.4–10.5)
CHLORIDE: 95 meq/L — AB (ref 96–112)
CO2: 23 meq/L (ref 19–32)
Creatinine, Ser: 0.8 mg/dL (ref 0.50–1.35)
GLUCOSE: 97 mg/dL (ref 70–99)
POTASSIUM: 3.8 meq/L (ref 3.7–5.3)
Sodium: 134 mEq/L — ABNORMAL LOW (ref 137–147)
Total Bilirubin: 0.5 mg/dL (ref 0.3–1.2)
Total Protein: 7.6 g/dL (ref 6.0–8.3)

## 2013-10-03 LAB — CBC WITH DIFFERENTIAL/PLATELET
Basophils Absolute: 0 10*3/uL (ref 0.0–0.1)
Basophils Relative: 1 % (ref 0–1)
Eosinophils Absolute: 0.1 10*3/uL (ref 0.0–0.7)
Eosinophils Relative: 1 % (ref 0–5)
HCT: 38 % — ABNORMAL LOW (ref 39.0–52.0)
Hemoglobin: 13.1 g/dL (ref 13.0–17.0)
LYMPHS ABS: 1.1 10*3/uL (ref 0.7–4.0)
Lymphocytes Relative: 24 % (ref 12–46)
MCH: 30.3 pg (ref 26.0–34.0)
MCHC: 34.5 g/dL (ref 30.0–36.0)
MCV: 88 fL (ref 78.0–100.0)
Monocytes Absolute: 0.6 10*3/uL (ref 0.1–1.0)
Monocytes Relative: 13 % — ABNORMAL HIGH (ref 3–12)
NEUTROS ABS: 2.9 10*3/uL (ref 1.7–7.7)
NEUTROS PCT: 61 % (ref 43–77)
Platelets: 388 10*3/uL (ref 150–400)
RBC: 4.32 MIL/uL (ref 4.22–5.81)
RDW: 14.9 % (ref 11.5–15.5)
WBC: 4.7 10*3/uL (ref 4.0–10.5)

## 2013-10-03 MED ORDER — SODIUM CHLORIDE 0.9 % IV BOLUS (SEPSIS)
1000.0000 mL | INTRAVENOUS | Status: AC
  Administered 2013-10-03: 1000 mL via INTRAVENOUS

## 2013-10-03 MED ORDER — ONDANSETRON 4 MG PO TBDP: ORAL_TABLET | ORAL | Status: DC

## 2013-10-03 MED ORDER — ONDANSETRON HCL 4 MG/2ML IJ SOLN
4.0000 mg | Freq: Once | INTRAMUSCULAR | Status: AC
  Administered 2013-10-03: 4 mg via INTRAVENOUS
  Filled 2013-10-03: qty 2

## 2013-10-03 NOTE — Discharge Instructions (Signed)

## 2013-10-03 NOTE — Telephone Encounter (Signed)
Patient Information:  Caller Name: Caren Griffins  Phone: [phone]  Patient: [name], [name]  Gender: Male  DOB: 09-22-1950  Age: 63 Years  PCP: Bluford Kaufmann (Family Practice > 68yrs old)  Office Follow Up:  Does the office need to follow up with this patient?: No  Instructions For The Office: N/A  RN Note:  Called office to give heads up before sending to ED and s/w Estill Bamberg and Butch Penny.  Butch Penny, LPN advised to send to ED for evaluation and pt will go to [facility].  Symptoms  Reason For Call & Symptoms: Onset 10/03/2013 at 10:30 vomiting x 4.  Released from [facility] 09/27/2013 for pneumonia,  Reviewed Health History In EMR: Yes  Reviewed Medications In EMR: Yes  Reviewed Allergies In EMR: Yes  Reviewed Surgeries / Procedures: Yes  Date of Onset of Symptoms: 10/03/2013  Guideline(s) Used:  Vomiting  Disposition Per Guideline:   Go to ED Now  Reason For Disposition Reached:   Moderate vomiting (e.g., 3 - 5 times/day) and age > 19  Advice Given:  Call Back If:  Signs of dehydration occur  You become worse.  Patient Will Follow Care Advice:  YES

## 2013-10-03 NOTE — ED Notes (Signed)
Pt called by registration per them, pt stated he was leaving. Called pts name twice no answer, went to look in lobby no pt found.

## 2013-10-03 NOTE — ED Provider Notes (Signed)
CSN: [id number]     Arrival date & time 10/03/13  1547 History   First MD Initiated Contact with Patient 10/03/13 1650     Chief Complaint  Patient presents with  . Emesis     (Consider location/radiation/quality/duration/timing/severity/associated sxs/prior Treatment) Patient is a 63 y.o. male presenting with vomiting.  Emesis Severity:  Mild Duration:  1 hour Timing: 3 times. Quality:  Stomach contents Progression:  Resolved Chronicity:  New Recent urination:  Normal Relieved by:  Nothing Worsened by:  Nothing tried Ineffective treatments:  None tried Associated symptoms: cough   Associated symptoms: no abdominal pain, no diarrhea, no fever and no headaches     Past Medical History  Diagnosis Date  . BENIGN PROSTATIC HYPERTROPHY 12/24/2009  . CEREBROVASCULAR ACCIDENT, HX OF 12/24/2009  . HYPERLIPIDEMIA 12/24/2009  . HYPERTENSION 12/24/2009  . Testosterone deficiency   . ED (erectile dysfunction)   . Claudication in peripheral vascular disease, lifestyle limiting, with abnormal arterial dopplers of lower ext. 06/30/2011  . S/P angioplasty with stent to Lt. SFA 06/30/11 06/30/2011  . COPD 12/24/2009  . Shortness of breath     "from the COPD" (10/13/2011)  . Stroke 1996    "right side not as strong as left since; numbness R leg/foot" (10/13/2011)  . Pernicious anemia   . Complication of anesthesia     hard to wake up  . Pneumonia   . 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 Brain metastases 05/22/13    ct head   . History of radiation therapy 06/02/13-06/16/13    brain   Past Surgical History  Procedure Laterality Date  . Cervical fusion  1980's    "total of 3-4 OR's on my neck; pinched nerve" (10/13/2011)  . Aneurysm coiling  2007  . Peripheral arterial stent graft  06/30/2010; 10/13/10    left; left  . Video bronchoscopy Bilateral 04/03/2012    Procedure: VIDEO BRONCHOSCOPY WITHOUT FLUORO;  Surgeon: Tanda Rockers, MD;  Location: [facility];  Service:  Cardiopulmonary;  Laterality: Bilateral;  . Amputation Left 08/26/2012    Procedure: IRRIGATION AND DEBRIDMENT LEFT DORSAL ABSCESS/ 4TH AND 5TH LEFT RAY AMPUTATION ;  Surgeon: Wylene Simmer, MD;  Location: [facility];  Service: Orthopedics;  Laterality: Left;  . Femoral-popliteal bypass graft Left 09/03/2012    Procedure: BYPASS GRAFT FEMORAL-POPLITEAL ARTERY;  Surgeon: Serafina Mitchell, MD;  Location: [facility];  Service: Vascular;  Laterality: Left;  . Cardiac catheterization    . Colonoscopy      hx: of  . I&d extremity Left 09/26/2012    Procedure: IRRIGATION AND DEBRIDEMENT EXTREMITY- LEFT FOOT WITH WOUND VAC PLACEMENT;  Surgeon: Serafina Mitchell, MD;  Location: [facility];  Service: Vascular;  Laterality: Left;   Family History  Problem Relation Age of Onset  . Emphysema Father     smoked  . Emphysema Paternal Uncle     smoked  . Heart disease Mother   . Lung cancer Father     smoked  . Lung cancer Paternal Grandfather     smoked   History  Substance Use Topics  . Smoking status: Current Every Day Smoker -- 0.75 packs/day for 45 years    Types: Cigarettes  . Smokeless tobacco: Never Used     Comment: cutting back  . Alcohol Use: No    Review of Systems  Constitutional: Negative for fever.  HENT: Negative for drooling and rhinorrhea.   Eyes: Negative for pain.  Respiratory: Negative for  cough and shortness of breath.   Cardiovascular: Negative for chest pain and leg swelling.  Gastrointestinal: Positive for nausea and vomiting. Negative for abdominal pain and diarrhea.  Genitourinary: Negative for dysuria and hematuria.  Musculoskeletal: Negative for gait problem and neck pain.  Skin: Negative for color change.  Neurological: Negative for numbness and headaches.  Hematological: Negative for adenopathy.  Psychiatric/Behavioral: Negative for behavioral problems.  All other systems reviewed and are negative.     Allergies  Review of patient's allergies indicates no known  allergies.  Home Medications   Prior to Admission medications   Medication Sig Start Date End Date Taking? Authorizing Provider  aspirin 81 MG tablet Take 81 mg by mouth at bedtime.     Historical Provider, MD  atorvastatin (LIPITOR) 20 MG tablet Take 1 tablet (20 mg total) by mouth daily. 06/20/13   Marletta Lor, MD  gabapentin (NEURONTIN) 600 MG tablet Take 1 tablet (600 mg total) by mouth every morning. 06/20/13   Marletta Lor, MD  guaiFENesin (MUCINEX) 600 MG 12 hr tablet Take 1 tablet (600 mg total) by mouth 2 (two) times daily. 09/27/13   Venetia Maxon Rama, MD  lactose free nutrition (BOOST PLUS) LIQD Take 237 mLs by mouth 3 (three) times daily with meals. 09/27/13   Venetia Maxon Rama, MD  levofloxacin (LEVAQUIN) 750 MG tablet Take 1 tablet (750 mg total) by mouth daily. 09/27/13   Venetia Maxon Rama, MD  megestrol (MEGACE) 40 MG tablet Take 4 tablets (160 mg total) by mouth daily. 09/27/13   Venetia Maxon Rama, MD  nortriptyline (PAMELOR) 75 MG capsule Take 75 mg by mouth at bedtime.    Historical Provider, MD  tamsulosin (FLOMAX) 0.4 MG CAPS capsule Take 1 capsule (0.4 mg total) by mouth daily. 06/20/13   Marletta Lor, MD   BP 127/70  Pulse 107  Temp(Src) 98.4 F (36.9 C) (Oral)  Resp 18  Ht 5\' 7"  (1.702 m)  Wt 110 lb (49.896 kg)  BMI 17.22 kg/m2  SpO2 98% Physical Exam  Nursing note and vitals reviewed. Constitutional: He is oriented to person, place, and time. He appears well-developed and well-nourished.  HENT:  Head: Normocephalic and atraumatic.  Right Ear: External ear normal.  Left Ear: External ear normal.  Nose: Nose normal.  Mouth/Throat: Oropharynx is clear and moist. No oropharyngeal exudate.  Eyes: Conjunctivae and EOM are normal. Pupils are equal, round, and reactive to light.  Neck: Normal range of motion. Neck supple.  Cardiovascular: Normal rate, regular rhythm, normal heart sounds and intact distal pulses.  Exam reveals no gallop and no friction rub.    No murmur heard. Pulmonary/Chest: Effort normal and breath sounds normal. No respiratory distress. He has no wheezes.  Abdominal: Soft. Bowel sounds are normal. He exhibits no distension. There is no tenderness. There is no rebound and no guarding.  Musculoskeletal: Normal range of motion. He exhibits no edema and no tenderness.  Neurological: He is alert and oriented to person, place, and time.  Skin: Skin is warm and dry.  Psychiatric: He has a normal mood and affect. His behavior is normal.    ED Course  Procedures (including critical care time) Labs Review Labs Reviewed  CBC WITH DIFFERENTIAL - Abnormal; Notable for the following:    HCT 38.0 (*)    Monocytes Relative 13 (*)    All other components within normal limits  COMPREHENSIVE METABOLIC PANEL - Abnormal; Notable for the following:    Sodium 134 (*)  Chloride 95 (*)    Albumin 3.4 (*)    Anion gap 16 (*)    All other components within normal limits    Imaging Review No results found.   EKG Interpretation None      MDM   Final diagnoses:  Nausea and vomiting, vomiting of unspecified type    5:18 PM 63 y.o. male w hx of lung cancer (last chemo 1 mos ago per pt) w/ brain mets, copd, recent admission for CAP who presents with an episode of nausea and vomiting which occurred around noon today. He states that he was outside drinking a Coke when he felt nauseous and vomited 3 times. The emesis was clear with some phlegm in it. He denies any diarrhea, shortness of breath, chest pain, abdominal pain, dysuria. He denies any fevers and states that he is otherwise been doing well. He is near the end of his course of Levaquin which he was sent home on. He is afebrile and mildly tachycardic. Last BM yesterday. Abd soft and benign. Labwork was sent prior to my evaluation. He is currently asymptomatic. He states that his cough has been improving and only has some mild white phlegm. Will treat him with IV fluids and  Zofran.  6:28 PM: I interpreted/reviewed the labs and/or imaging which were non-contributory.  Pt continues to appear well. He is tolerating oral intake. He states that he has a family member that has had a virus and it is possible that he has this as well.  I have discussed the diagnosis/risks/treatment options with the patient and family and believe the pt to be eligible for discharge home to follow-up with his pcp as needed. We also discussed returning to the ED immediately if new or worsening sx occur. We discussed the sx which are most concerning (e.g., inability to tolerate po, fever, abd pain, cp) that necessitate immediate return. Medications administered to the patient during their visit and any new prescriptions provided to the patient are listed below.  Medications given during this visit Medications  sodium chloride 0.9 % bolus 1,000 mL (1,000 mLs Intravenous New Bag/Given 10/03/13 1725)  ondansetron (ZOFRAN) injection 4 mg (4 mg Intravenous Given 10/03/13 1725)    New Prescriptions   ONDANSETRON (ZOFRAN ODT) 4 MG DISINTEGRATING TABLET    4mg  ODT q4 hours prn nausea/vomit     Pamella Pert, MD 10/03/13 1829

## 2013-10-03 NOTE — ED Notes (Signed)
Wife sts she called the pts MD office, who advised to come to the ED for further evaluation. Pt has pneumonia and was recently admitted to the hospital.

## 2013-10-03 NOTE — ED Notes (Signed)
Pt was just released from hospital for pneumonia. Pt states he vomited 4 times today. Denies pain.

## 2013-10-03 NOTE — ED Notes (Signed)
Pt given PO fluids at this time

## 2013-10-03 NOTE — ED Notes (Signed)
Pt c/o vomiting all day. Left AMA from [facility] PTA

## 2013-10-07 ENCOUNTER — Telehealth: Payer: Self-pay | Admitting: *Deleted

## 2013-10-07 NOTE — Telephone Encounter (Signed)
Pt cancelled Ct CAP and f/u with [facility] this week.  Pt was in the ED and was dx with PNA.  He will r/s his appts when his PNA is better per note written by schedulers.  Copy of CT angio done in the ED on 9/18 given to Dr Vista Mink to review.

## 2013-10-09 ENCOUNTER — Encounter: Payer: Self-pay | Admitting: Internal Medicine

## 2013-10-09 ENCOUNTER — Ambulatory Visit: Payer: Medicare Other | Admitting: Internal Medicine

## 2013-10-09 ENCOUNTER — Ambulatory Visit (INDEPENDENT_AMBULATORY_CARE_PROVIDER_SITE_OTHER): Payer: Medicare Other | Admitting: Internal Medicine

## 2013-10-09 VITALS — BP 100/60 | HR 94 | Temp 97.4°F | Resp 20 | Ht 67.0 in | Wt 110.0 lb

## 2013-10-09 DIAGNOSIS — I739 Peripheral vascular disease, unspecified: Secondary | ICD-10-CM

## 2013-10-09 DIAGNOSIS — C3490 Malignant neoplasm of unspecified part of unspecified bronchus or lung: Secondary | ICD-10-CM

## 2013-10-09 DIAGNOSIS — I2510 Atherosclerotic heart disease of native coronary artery without angina pectoris: Secondary | ICD-10-CM

## 2013-10-09 DIAGNOSIS — Z23 Encounter for immunization: Secondary | ICD-10-CM

## 2013-10-09 DIAGNOSIS — I10 Essential (primary) hypertension: Secondary | ICD-10-CM

## 2013-10-09 DIAGNOSIS — C3492 Malignant neoplasm of unspecified part of left bronchus or lung: Secondary | ICD-10-CM

## 2013-10-09 DIAGNOSIS — C7949 Secondary malignant neoplasm of other parts of nervous system: Principal | ICD-10-CM

## 2013-10-09 DIAGNOSIS — Z720 Tobacco use: Secondary | ICD-10-CM

## 2013-10-09 DIAGNOSIS — F17200 Nicotine dependence, unspecified, uncomplicated: Secondary | ICD-10-CM

## 2013-10-09 DIAGNOSIS — L98499 Non-pressure chronic ulcer of skin of other sites with unspecified severity: Secondary | ICD-10-CM

## 2013-10-09 DIAGNOSIS — J189 Pneumonia, unspecified organism: Secondary | ICD-10-CM

## 2013-10-09 DIAGNOSIS — C7931 Secondary malignant neoplasm of brain: Secondary | ICD-10-CM

## 2013-10-09 NOTE — Progress Notes (Signed)
   Subjective:    Patient ID: [name], male    DOB: November 25, 1950, 63 y.o.   MRN: [id number]  HPI  Wt Readings from Last 3 Encounters:  10/09/13 110 lb (49.896 kg)  10/03/13 110 lb (49.896 kg)  09/25/13 113 lb 15.7 oz (51.7 kg)    Review of Systems     Objective:   Physical Exam        Assessment & Plan:

## 2013-10-09 NOTE — Progress Notes (Signed)
Pre visit review using our clinic review tool, if applicable. No additional management support is needed unless otherwise documented below in the visit note. 

## 2013-10-09 NOTE — Progress Notes (Signed)
Subjective:    Patient ID: [name], male    DOB: [birth year], 63 y.o.   MRN: [id number]  HPI  Admit date: 09/24/2013  Discharge date: 09/27/2013  Recommendations for Outpatient Follow-Up:   1. F/U with Dr. Julien Nordmann in 5 weeks for repeat CT scan of chest to assess disease progression. 2. F/U final blood cultures. 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  He has been discharged recently for community acquired pneumonia.  Post discharge.  She has done quite well although remains weak, anorexic, and malnourished.  Pulmonary status has stabilized. He has minimal nonproductive cough Hospital records reviewed He was treated with Rocephin and azithromycin as an inpatient and discharged to complete Levaquin therapy  Past Medical History  Diagnosis Date  . BENIGN PROSTATIC HYPERTROPHY 12/24/2009  . CEREBROVASCULAR ACCIDENT, HX OF 12/24/2009  . HYPERLIPIDEMIA 12/24/2009  . HYPERTENSION 12/24/2009  . Testosterone deficiency   . ED (erectile dysfunction)   . Claudication in peripheral vascular disease, lifestyle limiting, with abnormal arterial dopplers of lower ext. 06/30/2011  . S/P angioplasty with stent to Lt. SFA 06/30/11 06/30/2011  . COPD 12/24/2009  . Shortness of breath     "from the COPD" (10/13/2011)  . Stroke 1996    "right side not as strong as left since; numbness R leg/foot" (10/13/2011)  . Pernicious anemia   . Complication of anesthesia     hard to wake up  . Pneumonia   . 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 Brain metastases 05/22/13    ct head   . History of  radiation therapy 06/02/13-06/16/13    brain    History   Social History  . Marital Status: Married    Spouse Name: N/A    Number of Children: 3  . Years of Education: N/A   Occupational History  . Retired     Press photographer   Social History Main Topics  . Smoking status: Current Every Day Smoker -- 0.75 packs/day for 45 years    Types: Cigarettes  . Smokeless tobacco: Never Used     Comment: cutting back  . Alcohol Use: No  . Drug Use: No  . Sexual Activity: Not Currently   Other Topics Concern  . Not on file   Social History Narrative  . No narrative on file    Past Surgical History  Procedure Laterality Date  . Cervical fusion  1980's    "total of 3-4 OR's on my neck; pinched nerve" (10/13/2011)  . Aneurysm coiling  2007  . Peripheral arterial stent graft  06/30/2010; 10/13/10    left; left  . Video bronchoscopy Bilateral 04/03/2012    Procedure: VIDEO BRONCHOSCOPY WITHOUT FLUORO;  Surgeon: Tanda Rockers, MD;  Location: [facility];  Service: Cardiopulmonary;  Laterality: Bilateral;  . Amputation Left 08/26/2012    Procedure: IRRIGATION AND DEBRIDMENT LEFT DORSAL ABSCESS/ 4TH AND 5TH LEFT RAY AMPUTATION ;  Surgeon: Wylene Simmer, MD;  Location: [facility];  Service: Orthopedics;  Laterality: Left;  . Femoral-popliteal bypass graft Left 09/03/2012    Procedure: BYPASS GRAFT FEMORAL-POPLITEAL ARTERY;  Surgeon: Butch Penny  Trula Slade, MD;  Location: [facility];  Service: Vascular;  Laterality: Left;  . Cardiac catheterization    . Colonoscopy      hx: of  . I&d extremity Left 09/26/2012    Procedure: IRRIGATION AND DEBRIDEMENT EXTREMITY- LEFT FOOT WITH WOUND VAC PLACEMENT;  Surgeon: Serafina Mitchell, MD;  Location: [facility];  Service: Vascular;  Laterality: Left;    Family History  Problem Relation Age of Onset  . Emphysema Father     smoked  . Emphysema Paternal Uncle     smoked  . Heart disease Mother   . Lung cancer Father     smoked  . Lung cancer Paternal Grandfather     smoked    No  Known Allergies  Current Outpatient Prescriptions on File Prior to Visit  Medication Sig Dispense Refill  . aspirin 81 MG tablet Take 81 mg by mouth at bedtime.       Marland Kitchen atorvastatin (LIPITOR) 20 MG tablet Take 1 tablet (20 mg total) by mouth daily.  90 tablet  1  . gabapentin (NEURONTIN) 600 MG tablet Take 1 tablet (600 mg total) by mouth every morning.  90 tablet  1  . guaiFENesin (MUCINEX) 600 MG 12 hr tablet Take 1 tablet (600 mg total) by mouth 2 (two) times daily.  60 tablet  0  . lactose free nutrition (BOOST PLUS) LIQD Take 237 mLs by mouth 3 (three) times daily with meals.    0  . nortriptyline (PAMELOR) 75 MG capsule Take 75 mg by mouth at bedtime.      . ondansetron (ZOFRAN ODT) 4 MG disintegrating tablet 4mg  ODT q4 hours prn nausea/vomit  20 tablet  0  . tamsulosin (FLOMAX) 0.4 MG CAPS capsule Take 1 capsule (0.4 mg total) by mouth daily.  90 capsule  1   No current facility-administered medications on file prior to visit.    BP 100/60  Pulse 94  Temp(Src) 97.4 F (36.3 C) (Oral)  Resp 20  Ht 5\' 7"  (1.702 m)  Wt 110 lb (49.896 kg)  BMI 17.22 kg/m2  SpO2 96%     Review of Systems  Constitutional: Positive for activity change, appetite change and fatigue. Negative for fever and chills.  HENT: Negative for congestion, dental problem, ear pain, hearing loss, sore throat, tinnitus, trouble swallowing and voice change.   Eyes: Negative for pain, discharge and visual disturbance.  Respiratory: Positive for cough. Negative for chest tightness, wheezing and stridor.   Cardiovascular: Negative for chest pain, palpitations and leg swelling.  Gastrointestinal: Negative for nausea, vomiting, abdominal pain, diarrhea, constipation, blood in stool and abdominal distention.  Genitourinary: Negative for urgency, hematuria, flank pain, discharge, difficulty urinating and genital sores.  Musculoskeletal: Negative for arthralgias, back pain, gait problem, joint swelling, myalgias and neck  stiffness.  Skin: Negative for rash.  Neurological: Positive for weakness. Negative for dizziness, syncope, speech difficulty, numbness and headaches.  Hematological: Negative for adenopathy. Does not bruise/bleed easily.  Psychiatric/Behavioral: Negative for behavioral problems and dysphoric mood. The patient is not nervous/anxious.        Objective:   Physical Exam  Constitutional: He is oriented to person, place, and time. He appears well-developed.  Weak and chronically ill Blood pressure low normal Afebrile  HENT:  Head: Normocephalic.  Right Ear: External ear normal.  Left Ear: External ear normal.  Poor dental hygiene  Eyes: Conjunctivae and EOM are normal.  Neck: Normal range of motion.  Cardiovascular: Normal rate and normal heart sounds.  Pulmonary/Chest: Effort normal.  Few coarse rhonchi, left greater than right  Abdominal: Bowel sounds are normal.  Musculoskeletal: Normal range of motion. He exhibits no edema and no tenderness.  Neurological: He is alert and oriented to person, place, and time.  Skin:  Alopecia totalis  Psychiatric: He has a normal mood and affect. His behavior is normal.          Assessment & Plan:   Status post community acquired pneumonia Metastatic lung carcinoma.  Followup oncology as scheduled History of hypertension.  Presently normotensive off medication Malnutrition. COPD Peripheral vascular disease  Prevnar 13.  Given Patient has received flu vaccine Return in 6 months for followup

## 2013-10-09 NOTE — Patient Instructions (Signed)
Oncology followup as scheduled  Return in 6 months for follow-up

## 2013-10-13 ENCOUNTER — Encounter (HOSPITAL_BASED_OUTPATIENT_CLINIC_OR_DEPARTMENT_OTHER): Payer: Self-pay | Admitting: Emergency Medicine

## 2013-10-13 ENCOUNTER — Emergency Department (HOSPITAL_BASED_OUTPATIENT_CLINIC_OR_DEPARTMENT_OTHER)
Admission: EM | Admit: 2013-10-13 | Discharge: 2013-10-13 | Disposition: A | Discharge status: Home / Self Care | Payer: Medicare Other | Attending: Emergency Medicine | Admitting: Emergency Medicine

## 2013-10-13 ENCOUNTER — Emergency Department (HOSPITAL_BASED_OUTPATIENT_CLINIC_OR_DEPARTMENT_OTHER): Payer: Medicare Other

## 2013-10-13 DIAGNOSIS — Z85841 Personal history of malignant neoplasm of brain: Secondary | ICD-10-CM | POA: Insufficient documentation

## 2013-10-13 DIAGNOSIS — Z9861 Coronary angioplasty status: Secondary | ICD-10-CM | POA: Diagnosis not present

## 2013-10-13 DIAGNOSIS — E785 Hyperlipidemia, unspecified: Secondary | ICD-10-CM | POA: Insufficient documentation

## 2013-10-13 DIAGNOSIS — J441 Chronic obstructive pulmonary disease with (acute) exacerbation: Secondary | ICD-10-CM | POA: Insufficient documentation

## 2013-10-13 DIAGNOSIS — Z79899 Other long term (current) drug therapy: Secondary | ICD-10-CM | POA: Diagnosis not present

## 2013-10-13 DIAGNOSIS — Z9889 Other specified postprocedural states: Secondary | ICD-10-CM | POA: Diagnosis not present

## 2013-10-13 DIAGNOSIS — F17200 Nicotine dependence, unspecified, uncomplicated: Secondary | ICD-10-CM | POA: Diagnosis not present

## 2013-10-13 DIAGNOSIS — I10 Essential (primary) hypertension: Secondary | ICD-10-CM | POA: Diagnosis not present

## 2013-10-13 DIAGNOSIS — R531 Weakness: Secondary | ICD-10-CM

## 2013-10-13 DIAGNOSIS — R5383 Other fatigue: Principal | ICD-10-CM

## 2013-10-13 DIAGNOSIS — Z85118 Personal history of other malignant neoplasm of bronchus and lung: Secondary | ICD-10-CM | POA: Diagnosis not present

## 2013-10-13 DIAGNOSIS — Z87448 Personal history of other diseases of urinary system: Secondary | ICD-10-CM | POA: Insufficient documentation

## 2013-10-13 DIAGNOSIS — Z8673 Personal history of transient ischemic attack (TIA), and cerebral infarction without residual deficits: Secondary | ICD-10-CM | POA: Insufficient documentation

## 2013-10-13 DIAGNOSIS — Z7982 Long term (current) use of aspirin: Secondary | ICD-10-CM | POA: Insufficient documentation

## 2013-10-13 DIAGNOSIS — R5381 Other malaise: Secondary | ICD-10-CM | POA: Insufficient documentation

## 2013-10-13 DIAGNOSIS — Z923 Personal history of irradiation: Secondary | ICD-10-CM | POA: Diagnosis not present

## 2013-10-13 LAB — COMPREHENSIVE METABOLIC PANEL WITH GFR
ALT: 13 U/L (ref 0–53)
AST: 15 U/L (ref 0–37)
Albumin: 3.4 g/dL — ABNORMAL LOW (ref 3.5–5.2)
Alkaline Phosphatase: 109 U/L (ref 39–117)
Anion gap: 12 (ref 5–15)
BUN: 15 mg/dL (ref 6–23)
CO2: 27 meq/L (ref 19–32)
Calcium: 9.6 mg/dL (ref 8.4–10.5)
Chloride: 98 meq/L (ref 96–112)
Creatinine, Ser: 0.9 mg/dL (ref 0.50–1.35)
GFR calc Af Amer: >90 mL/min (ref >90)
GFR calc non Af Amer: 88 mL/min — ABNORMAL LOW (ref >90)
Glucose, Bld: 125 mg/dL — ABNORMAL HIGH (ref 70–99)
Potassium: 3.7 meq/L (ref 3.7–5.3)
Sodium: 137 meq/L (ref 137–147)
Total Bilirubin: 0.3 mg/dL (ref 0.3–1.2)
Total Protein: 7.3 g/dL (ref 6.0–8.3)

## 2013-10-13 LAB — CBC WITH DIFFERENTIAL/PLATELET
Basophils Absolute: 0 K/uL (ref 0.0–0.1)
Basophils Relative: 1 % (ref 0–1)
Eosinophils Absolute: 0 K/uL (ref 0.0–0.7)
Eosinophils Relative: 1 % (ref 0–5)
HCT: 37.8 % — ABNORMAL LOW (ref 39.0–52.0)
Hemoglobin: 12.8 g/dL — ABNORMAL LOW (ref 13.0–17.0)
Lymphocytes Relative: 15 % (ref 12–46)
Lymphs Abs: 0.8 K/uL (ref 0.7–4.0)
MCH: 30.3 pg (ref 26.0–34.0)
MCHC: 33.9 g/dL (ref 30.0–36.0)
MCV: 89.4 fL (ref 78.0–100.0)
Monocytes Absolute: 0.5 K/uL (ref 0.1–1.0)
Monocytes Relative: 9 % (ref 3–12)
Neutro Abs: 3.9 K/uL (ref 1.7–7.7)
Neutrophils Relative %: 74 % (ref 43–77)
Platelets: 342 K/uL (ref 150–400)
RBC: 4.23 MIL/uL (ref 4.22–5.81)
RDW: 15.3 % (ref 11.5–15.5)
WBC: 5.3 K/uL (ref 4.0–10.5)

## 2013-10-13 LAB — TROPONIN I: Troponin I: <0.3 ng/mL (ref <0.30)

## 2013-10-13 MED ORDER — SODIUM CHLORIDE 0.9 % IV BOLUS (SEPSIS)
1000.0000 mL | INTRAVENOUS | Status: AC
  Administered 2013-10-13: 1000 mL via INTRAVENOUS

## 2013-10-13 NOTE — ED Notes (Signed)
Weakness. Recent pneumonia. Lung cancer patient.

## 2013-10-13 NOTE — ED Provider Notes (Signed)
CSN: [id number]     Arrival date & time 10/13/13  1417 History  This chart was scribed for Pamella Pert, MD by Ladene Artist, ED Scribe. The patient was seen in room MH12/MH12. Patient's care was started at 4:18 PM.   Chief Complaint  Patient presents with  . Weakness   Patient is a 63 y.o. male presenting with weakness. The history is provided by the patient. No language interpreter was used.  Weakness This is a new problem. The current episode started more than 2 days ago. The problem has been gradually worsening. Associated symptoms include shortness of breath (unchanged). Pertinent negatives include no chest pain, no abdominal pain and no headaches. He has tried nothing for the symptoms.   HPI Comments: CHINEDUM VANHOUTEN is a 63 y.o. male who presents to the Emergency Department complaining of generalized weakness onset 2-3 days ago. Pt reports associated productive cough with clear sputum and fever onset today, ED temperature 100 F. He denies chills, vomiting, diarrhea, nausea, chest pain, sore throat, appetite change, abdominal pain, dysuria, change in SOB, any pain at this time. Pt was admitted 09/29/13 and discharged 09/27/13 for pneumonia; pt has finished antibiotics. Last chemo and radiology treatments were 1 year ago. Pt's granddaughter who lives with him currently has strep throat. No h/o cardiac related conditions.  Past Medical History  Diagnosis Date  . BENIGN PROSTATIC HYPERTROPHY 12/24/2009  . CEREBROVASCULAR ACCIDENT, HX OF 12/24/2009  . HYPERLIPIDEMIA 12/24/2009  . HYPERTENSION 12/24/2009  . Testosterone deficiency   . ED (erectile dysfunction)   . Claudication in peripheral vascular disease, lifestyle limiting, with abnormal arterial dopplers of lower ext. 06/30/2011  . S/P angioplasty with stent to Lt. SFA 06/30/11 06/30/2011  . COPD 12/24/2009  . Shortness of breath     "from the COPD" (10/13/2011)  . Stroke 1996    "right side not as strong as left since; numbness R  leg/foot" (10/13/2011)  . Pernicious anemia   . Complication of anesthesia     hard to wake up  . Pneumonia   . 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 Brain metastases 05/22/13    ct head   . History of radiation therapy 06/02/13-06/16/13    brain   Past Surgical History  Procedure Laterality Date  . Cervical fusion  1980's    "total of 3-4 OR's on my neck; pinched nerve" (10/13/2011)  . Aneurysm coiling  2007  . Peripheral arterial stent graft  06/30/2010; 10/13/10    left; left  . Video bronchoscopy Bilateral 04/03/2012    Procedure: VIDEO BRONCHOSCOPY WITHOUT FLUORO;  Surgeon: Tanda Rockers, MD;  Location: [facility];  Service: Cardiopulmonary;  Laterality: Bilateral;  . Amputation Left 08/26/2012    Procedure: IRRIGATION AND DEBRIDMENT LEFT DORSAL ABSCESS/ 4TH AND 5TH LEFT RAY AMPUTATION ;  Surgeon: Wylene Simmer, MD;  Location: [facility];  Service: Orthopedics;  Laterality: Left;  . Femoral-popliteal bypass graft Left 09/03/2012    Procedure: BYPASS GRAFT FEMORAL-POPLITEAL ARTERY;  Surgeon: Serafina Mitchell, MD;  Location: [facility];  Service: Vascular;  Laterality: Left;  . Cardiac catheterization    . Colonoscopy      hx: of  . I&d extremity Left 09/26/2012    Procedure: IRRIGATION AND DEBRIDEMENT EXTREMITY- LEFT FOOT WITH WOUND VAC PLACEMENT;  Surgeon: Serafina Mitchell, MD;  Location: [facility];  Service: Vascular;  Laterality: Left;   Family History  Problem Relation Age of Onset  . Emphysema Father  smoked  . Emphysema Paternal Uncle     smoked  . Heart disease Mother   . Lung cancer Father     smoked  . Lung cancer Paternal Grandfather     smoked   History  Substance Use Topics  . Smoking status: Current Every Day Smoker -- 0.75 packs/day for 45 years    Types: Cigarettes  . Smokeless tobacco: Never Used     Comment: cutting back  . Alcohol Use: No    Review of Systems  Constitutional: Positive for fever. Negative for chills, appetite change and fatigue.   HENT: Negative for congestion, ear discharge, sinus pressure and sore throat.   Eyes: Negative for discharge.  Respiratory: Positive for cough and shortness of breath (unchanged).   Cardiovascular: Negative for chest pain.  Gastrointestinal: Negative for nausea, vomiting, abdominal pain and diarrhea.  Genitourinary: Negative for dysuria, frequency and hematuria.  Musculoskeletal: Negative for back pain.  Skin: Negative for rash.  Neurological: Positive for weakness. Negative for seizures and headaches.  Psychiatric/Behavioral: Negative for hallucinations.  All other systems reviewed and are negative.  Allergies  Review of patient's allergies indicates no known allergies.  Home Medications   Prior to Admission medications   Medication Sig Start Date End Date Taking? Authorizing Provider  aspirin 81 MG tablet Take 81 mg by mouth at bedtime.     Historical Provider, MD  atorvastatin (LIPITOR) 20 MG tablet Take 1 tablet (20 mg total) by mouth daily. 06/20/13   Marletta Lor, MD  gabapentin (NEURONTIN) 600 MG tablet Take 1 tablet (600 mg total) by mouth every morning. 06/20/13   Marletta Lor, MD  guaiFENesin (MUCINEX) 600 MG 12 hr tablet Take 1 tablet (600 mg total) by mouth 2 (two) times daily. 09/27/13   Venetia Maxon Rama, MD  lactose free nutrition (BOOST PLUS) LIQD Take 237 mLs by mouth 3 (three) times daily with meals. 09/27/13   Venetia Maxon Rama, MD  nortriptyline (PAMELOR) 75 MG capsule Take 75 mg by mouth at bedtime.    Historical Provider, MD  ondansetron (ZOFRAN ODT) 4 MG disintegrating tablet 4mg  ODT q4 hours prn nausea/vomit 10/03/13   Pamella Pert, MD  tamsulosin (FLOMAX) 0.4 MG CAPS capsule Take 1 capsule (0.4 mg total) by mouth daily. 06/20/13   Marletta Lor, MD   Triage Vitals: BP 141/66  Pulse 101  Temp(Src) 100 F (37.8 C) (Oral)  Resp 20  Ht 5\' 7"  (1.702 m)  Wt 110 lb (49.896 kg)  BMI 17.22 kg/m2  SpO2 94% Physical Exam  Nursing note and vitals  reviewed. Constitutional: He is oriented to person, place, and time. He appears well-developed and well-nourished. No distress.  HENT:  Head: Normocephalic and atraumatic.  Right Ear: Hearing normal.  Left Ear: Hearing normal.  Nose: Nose normal.  Mouth/Throat: Oropharynx is clear and moist and mucous membranes are normal.  Eyes: Conjunctivae and EOM are normal. Pupils are equal, round, and reactive to light.  Neck: Normal range of motion. Neck supple.  Cardiovascular: Normal rate, regular rhythm, S1 normal and S2 normal.  Exam reveals no gallop and no friction rub.   No murmur heard. Pulmonary/Chest: Effort normal and breath sounds normal. No respiratory distress. He exhibits no tenderness.  Abdominal: Soft. Normal appearance and bowel sounds are normal. There is no hepatosplenomegaly. There is no tenderness. There is no rebound, no guarding, no tenderness at McBurney's point and negative Murphy's sign. No hernia.  Musculoskeletal: Normal range of motion.  Neurological: He is  alert and oriented to person, place, and time. He has normal strength. No sensory deficit. Coordination and gait normal. GCS eye subscore is 4. GCS verbal subscore is 5. GCS motor subscore is 6.  Normal strength and sensation  Skin: Skin is warm, dry and intact. No rash noted. No cyanosis.  Psychiatric: He has a normal mood and affect. His speech is normal and behavior is normal. Thought content normal.   ED Course  Procedures (including critical care time) DIAGNOSTIC STUDIES: Oxygen Saturation is 94% on RA, adequate by my interpretation.    COORDINATION OF CARE: 4:25 PM-Discussed treatment plan which includes CXR with pt at bedside and pt agreed to plan.   Labs Review Labs Reviewed  CBC WITH DIFFERENTIAL - Abnormal; Notable for the following:    Hemoglobin 12.8 (*)    HCT 37.8 (*)    All other components within normal limits  COMPREHENSIVE METABOLIC PANEL - Abnormal; Notable for the following:    Glucose,  Bld 125 (*)    Albumin 3.4 (*)    GFR calc non Af Amer 88 (*)    All other components within normal limits  TROPONIN I  URINALYSIS, ROUTINE W REFLEX MICROSCOPIC   Imaging Review Dg Chest 2 View  10/13/2013   CLINICAL DATA:  Weakness, currently being treated for pneumonia; on therapy for metastatic lung malignancy, history of COPD and previous tobacco use  EXAM: CHEST  2 VIEW  COMPARISON:  PA and lateral chest x-ray and CT scan of the chest dated September 24, 2013.  FINDINGS: The lungs are hyperinflated with hemidiaphragm flattening. There is no pleural effusion or pneumothorax. There is stable parenchymal density medially in the left upper lobe consistent with known malignancy. The heart and mediastinal structures are unremarkable. The bony thorax exhibits no acute abnormalities.  IMPRESSION: COPD without evidence of pneumonia. There is a stable parenchymal mass in the medial aspect of the left upper lobe.   Electronically Signed   By: David  Martinique   On: 10/13/2013 15:01    EKG Interpretation None      MDM   Final diagnoses:  Generalized weakness    5:38 PM 63 y.o. male hx of lung cancer w/ brain mets, copd, recent admission for CAP earlier in the month who presents with generalized weakness and low-grade fever. He has had an ongoing cough productive of white sputum but no worsening. He denies any chest pain, shortness of breath, vomiting, diarrhea, abdominal pain, headache. He is afebrile with a temperature of 100.0, heart rate of 101, and other vital signs unremarkable. Will get screening labs, chest x-ray, urinalysis. Possibly prodrome to a viral syndrome.  6:45 PM: I interpreted/reviewed the labs and/or imaging which were non-contributory.  Pt having some mild trouble producing urine, has hx of prostate problems and urinated pta. He would prefer not to wait on the urine. No hx of GU sx at home. Doubt the UA will be contributory.  I have discussed the diagnosis/risks/treatment options  with the patient and believe the pt to be eligible for discharge home to follow-up with his pcp tomorrow if his sx continue. We also discussed returning to the ED immediately if new or worsening sx occur. We discussed the sx which are most concerning (e.g., fever, vomiting, diarrhea, pain) that necessitate immediate return. Medications administered to the patient during their visit and any new prescriptions provided to the patient are listed below.  Medications given during this visit Medications  sodium chloride 0.9 % bolus 1,000 mL (0 mLs  Intravenous Stopped 10/13/13 1832)    New Prescriptions   No medications on file      I personally performed the services described in this documentation, which was scribed in my presence. The recorded information has been reviewed and is accurate.  `  Pamella Pert, MD 10/13/13 [id number]

## 2013-10-13 NOTE — ED Notes (Signed)
Pt taken to car by wheelchair- family present to drive

## 2013-10-13 NOTE — ED Notes (Signed)
Unable to void at present time. Family at bedside

## 2013-10-20 ENCOUNTER — Encounter (HOSPITAL_COMMUNITY): Payer: Self-pay | Admitting: Emergency Medicine

## 2013-10-20 ENCOUNTER — Inpatient Hospital Stay (HOSPITAL_COMMUNITY)
Admission: EM | Admit: 2013-10-20 | Discharge: 2013-10-27 | DRG: 100 | Disposition: A | Discharge status: Hospice | Payer: Medicare Other | Attending: Family Medicine | Admitting: Family Medicine

## 2013-10-20 ENCOUNTER — Emergency Department (HOSPITAL_COMMUNITY): Payer: Medicare Other

## 2013-10-20 ENCOUNTER — Telehealth: Payer: Self-pay | Admitting: Internal Medicine

## 2013-10-20 DIAGNOSIS — J449 Chronic obstructive pulmonary disease, unspecified: Secondary | ICD-10-CM | POA: Diagnosis present

## 2013-10-20 DIAGNOSIS — R627 Adult failure to thrive: Secondary | ICD-10-CM | POA: Diagnosis present

## 2013-10-20 DIAGNOSIS — Z923 Personal history of irradiation: Secondary | ICD-10-CM

## 2013-10-20 DIAGNOSIS — C7931 Secondary malignant neoplasm of brain: Secondary | ICD-10-CM | POA: Diagnosis present

## 2013-10-20 DIAGNOSIS — G4089 Other seizures: Secondary | ICD-10-CM | POA: Diagnosis not present

## 2013-10-20 DIAGNOSIS — Z681 Body mass index (BMI) 19 or less, adult: Secondary | ICD-10-CM

## 2013-10-20 DIAGNOSIS — B370 Candidal stomatitis: Secondary | ICD-10-CM | POA: Diagnosis present

## 2013-10-20 DIAGNOSIS — Z8673 Personal history of transient ischemic attack (TIA), and cerebral infarction without residual deficits: Secondary | ICD-10-CM

## 2013-10-20 DIAGNOSIS — E785 Hyperlipidemia, unspecified: Secondary | ICD-10-CM | POA: Diagnosis present

## 2013-10-20 DIAGNOSIS — Z515 Encounter for palliative care: Secondary | ICD-10-CM

## 2013-10-20 DIAGNOSIS — Z8249 Family history of ischemic heart disease and other diseases of the circulatory system: Secondary | ICD-10-CM

## 2013-10-20 DIAGNOSIS — D649 Anemia, unspecified: Secondary | ICD-10-CM | POA: Diagnosis present

## 2013-10-20 DIAGNOSIS — Z981 Arthrodesis status: Secondary | ICD-10-CM

## 2013-10-20 DIAGNOSIS — C3492 Malignant neoplasm of unspecified part of left bronchus or lung: Secondary | ICD-10-CM | POA: Diagnosis present

## 2013-10-20 DIAGNOSIS — R569 Unspecified convulsions: Secondary | ICD-10-CM

## 2013-10-20 DIAGNOSIS — R451 Restlessness and agitation: Secondary | ICD-10-CM | POA: Diagnosis present

## 2013-10-20 DIAGNOSIS — Z8679 Personal history of other diseases of the circulatory system: Secondary | ICD-10-CM

## 2013-10-20 DIAGNOSIS — R197 Diarrhea, unspecified: Secondary | ICD-10-CM

## 2013-10-20 DIAGNOSIS — I10 Essential (primary) hypertension: Secondary | ICD-10-CM | POA: Diagnosis present

## 2013-10-20 DIAGNOSIS — IMO0001 Reserved for inherently not codable concepts without codable children: Secondary | ICD-10-CM

## 2013-10-20 DIAGNOSIS — G9340 Encephalopathy, unspecified: Secondary | ICD-10-CM | POA: Diagnosis present

## 2013-10-20 DIAGNOSIS — I5031 Acute diastolic (congestive) heart failure: Secondary | ICD-10-CM | POA: Diagnosis present

## 2013-10-20 DIAGNOSIS — E876 Hypokalemia: Secondary | ICD-10-CM | POA: Diagnosis present

## 2013-10-20 DIAGNOSIS — E860 Dehydration: Secondary | ICD-10-CM | POA: Diagnosis present

## 2013-10-20 DIAGNOSIS — R079 Chest pain, unspecified: Secondary | ICD-10-CM

## 2013-10-20 DIAGNOSIS — E43 Unspecified severe protein-calorie malnutrition: Secondary | ICD-10-CM

## 2013-10-20 DIAGNOSIS — R64 Cachexia: Secondary | ICD-10-CM | POA: Diagnosis present

## 2013-10-20 DIAGNOSIS — Z8701 Personal history of pneumonia (recurrent): Secondary | ICD-10-CM

## 2013-10-20 DIAGNOSIS — C719 Malignant neoplasm of brain, unspecified: Secondary | ICD-10-CM

## 2013-10-20 DIAGNOSIS — R1310 Dysphagia, unspecified: Secondary | ICD-10-CM | POA: Diagnosis present

## 2013-10-20 DIAGNOSIS — E871 Hypo-osmolality and hyponatremia: Secondary | ICD-10-CM | POA: Diagnosis present

## 2013-10-20 DIAGNOSIS — F17210 Nicotine dependence, cigarettes, uncomplicated: Secondary | ICD-10-CM | POA: Diagnosis present

## 2013-10-20 DIAGNOSIS — Z7982 Long term (current) use of aspirin: Secondary | ICD-10-CM

## 2013-10-20 DIAGNOSIS — Z66 Do not resuscitate: Secondary | ICD-10-CM | POA: Diagnosis present

## 2013-10-20 DIAGNOSIS — Z825 Family history of asthma and other chronic lower respiratory diseases: Secondary | ICD-10-CM

## 2013-10-20 DIAGNOSIS — Z801 Family history of malignant neoplasm of trachea, bronchus and lung: Secondary | ICD-10-CM

## 2013-10-20 LAB — CBC
HEMATOCRIT: 37.1 % — AB (ref 39.0–52.0)
Hemoglobin: 12.4 g/dL — ABNORMAL LOW (ref 13.0–17.0)
MCH: 29.9 pg (ref 26.0–34.0)
MCHC: 33.4 g/dL (ref 30.0–36.0)
MCV: 89.4 fL (ref 78.0–100.0)
Platelets: 256 10*3/uL (ref 150–400)
RBC: 4.15 MIL/uL — ABNORMAL LOW (ref 4.22–5.81)
RDW: 15.1 % (ref 11.5–15.5)
WBC: 5.2 10*3/uL (ref 4.0–10.5)

## 2013-10-20 LAB — COMPREHENSIVE METABOLIC PANEL
ALT: 9 U/L (ref 0–53)
ANION GAP: 10 (ref 5–15)
AST: 12 U/L (ref 0–37)
Albumin: 3 g/dL — ABNORMAL LOW (ref 3.5–5.2)
Alkaline Phosphatase: 101 U/L (ref 39–117)
BILIRUBIN TOTAL: 0.6 mg/dL (ref 0.3–1.2)
BUN: 7 mg/dL (ref 6–23)
CO2: 28 mEq/L (ref 19–32)
CREATININE: 0.6 mg/dL (ref 0.50–1.35)
Calcium: 8.8 mg/dL (ref 8.4–10.5)
Chloride: 93 mEq/L — ABNORMAL LOW (ref 96–112)
GFR calc Af Amer: >90 mL/min (ref >90)
GFR calc non Af Amer: >90 mL/min (ref >90)
Glucose, Bld: 84 mg/dL (ref 70–99)
Potassium: 2.9 mEq/L — CL (ref 3.7–5.3)
Sodium: 131 mEq/L — ABNORMAL LOW (ref 137–147)
TOTAL PROTEIN: 6.4 g/dL (ref 6.0–8.3)

## 2013-10-20 LAB — MAGNESIUM: Magnesium: 1.5 mg/dL (ref 1.5–2.5)

## 2013-10-20 LAB — POC OCCULT BLOOD, ED: FECAL OCCULT BLD: NEGATIVE

## 2013-10-20 LAB — MRSA PCR SCREENING: MRSA by PCR: NEGATIVE

## 2013-10-20 MED ORDER — LORAZEPAM 2 MG/ML IJ SOLN
1.0000 mg | Freq: Once | INTRAMUSCULAR | Status: AC
  Administered 2013-10-20: 1 mg via INTRAVENOUS
  Filled 2013-10-20: qty 1

## 2013-10-20 MED ORDER — GABAPENTIN 300 MG PO CAPS: 600.0000 mg | ORAL_CAPSULE | Freq: Every day | ORAL | Status: DC

## 2013-10-20 MED ORDER — TAMSULOSIN HCL 0.4 MG PO CAPS: 0.4000 mg | ORAL_CAPSULE | Freq: Every day | ORAL | Status: DC

## 2013-10-20 MED ORDER — POTASSIUM CHLORIDE IN NACL 20-0.9 MEQ/L-% IV SOLN
INTRAVENOUS | Status: DC
  Administered 2013-10-21: 75 mL/h via INTRAVENOUS
  Administered 2013-10-21: 14:00:00 via INTRAVENOUS
  Filled 2013-10-20 (×2): qty 1000

## 2013-10-20 MED ORDER — LEVETIRACETAM IN NACL 500 MG/100ML IV SOLN
500.0000 mg | Freq: Two times a day (BID) | INTRAVENOUS | Status: DC
  Administered 2013-10-21 – 2013-10-27 (×14): 500 mg via INTRAVENOUS
  Filled 2013-10-20 (×16): qty 100

## 2013-10-20 MED ORDER — ACETAMINOPHEN 650 MG RE SUPP
650.0000 mg | Freq: Four times a day (QID) | RECTAL | Status: DC | PRN
  Administered 2013-10-23 – 2013-10-25 (×2): 650 mg via RECTAL
  Filled 2013-10-20 (×2): qty 1

## 2013-10-20 MED ORDER — ONDANSETRON HCL 4 MG/2ML IJ SOLN: 4.0000 mg | Freq: Four times a day (QID) | INTRAMUSCULAR | Status: DC | PRN

## 2013-10-20 MED ORDER — LORAZEPAM 2 MG/ML IJ SOLN
INTRAMUSCULAR | Status: AC
  Administered 2013-10-20: 1 mg
  Filled 2013-10-20: qty 1

## 2013-10-20 MED ORDER — ACETAMINOPHEN 325 MG PO TABS
650.0000 mg | ORAL_TABLET | Freq: Four times a day (QID) | ORAL | Status: DC | PRN
  Administered 2013-10-22: 650 mg via ORAL
  Filled 2013-10-20: qty 2

## 2013-10-20 MED ORDER — POTASSIUM CHLORIDE CRYS ER 20 MEQ PO TBCR
40.0000 meq | EXTENDED_RELEASE_TABLET | Freq: Once | ORAL | Status: AC
  Administered 2013-10-20: 40 meq via ORAL
  Filled 2013-10-20: qty 2

## 2013-10-20 MED ORDER — ENOXAPARIN SODIUM 40 MG/0.4ML ~~LOC~~ SOLN
40.0000 mg | Freq: Every day | SUBCUTANEOUS | Status: DC
  Administered 2013-10-21 – 2013-10-25 (×6): 40 mg via SUBCUTANEOUS
  Filled 2013-10-20 (×6): qty 0.4

## 2013-10-20 MED ORDER — ONDANSETRON 4 MG PO TBDP
4.0000 mg | ORAL_TABLET | ORAL | Status: DC | PRN
  Filled 2013-10-20: qty 1

## 2013-10-20 MED ORDER — LEVETIRACETAM IN NACL 1000 MG/100ML IV SOLN
1000.0000 mg | Freq: Once | INTRAVENOUS | Status: AC
  Administered 2013-10-20: 1000 mg via INTRAVENOUS
  Filled 2013-10-20: qty 100

## 2013-10-20 MED ORDER — ONDANSETRON HCL 4 MG PO TABS: 4.0000 mg | ORAL_TABLET | Freq: Four times a day (QID) | ORAL | Status: DC | PRN

## 2013-10-20 MED ORDER — POTASSIUM CHLORIDE 10 MEQ/100ML IV SOLN
10.0000 meq | INTRAVENOUS | Status: AC
  Administered 2013-10-21: 10 meq via INTRAVENOUS
  Filled 2013-10-20: qty 100

## 2013-10-20 MED ORDER — PNEUMOCOCCAL VAC POLYVALENT 25 MCG/0.5ML IJ INJ
0.5000 mL | INJECTION | Freq: Once | INTRAMUSCULAR | Status: AC
  Administered 2013-10-21: 0.5 mL via INTRAMUSCULAR
  Filled 2013-10-20: qty 0.5

## 2013-10-20 MED ORDER — ATORVASTATIN CALCIUM 10 MG PO TABS
20.0000 mg | ORAL_TABLET | Freq: Every day | ORAL | Status: DC
Start: 2013-10-21 — End: 2013-10-21

## 2013-10-20 MED ORDER — ASPIRIN EC 81 MG PO TBEC: 81.0000 mg | DELAYED_RELEASE_TABLET | Freq: Every day | ORAL | Status: DC

## 2013-10-20 MED ORDER — PANTOPRAZOLE SODIUM 40 MG IV SOLR
40.0000 mg | Freq: Once | INTRAVENOUS | Status: AC
  Administered 2013-10-20: 40 mg via INTRAVENOUS
  Filled 2013-10-20: qty 40

## 2013-10-20 MED ORDER — NORTRIPTYLINE HCL 25 MG PO CAPS
75.0000 mg | ORAL_CAPSULE | Freq: Every day | ORAL | Status: DC
  Filled 2013-10-20 (×2): qty 3

## 2013-10-20 MED ORDER — LORAZEPAM 2 MG/ML IJ SOLN
1.0000 mg | INTRAMUSCULAR | Status: DC | PRN
  Filled 2013-10-20: qty 1

## 2013-10-20 MED ORDER — ALBUTEROL SULFATE HFA 108 (90 BASE) MCG/ACT IN AERS: 2.0000 | INHALATION_SPRAY | RESPIRATORY_TRACT | Status: DC | PRN

## 2013-10-20 MED ORDER — ALBUTEROL SULFATE (2.5 MG/3ML) 0.083% IN NEBU
2.5000 mg | INHALATION_SOLUTION | RESPIRATORY_TRACT | Status: DC | PRN
  Administered 2013-10-21: 2.5 mg via RESPIRATORY_TRACT
  Filled 2013-10-20: qty 3

## 2013-10-20 MED ORDER — POTASSIUM CHLORIDE 10 MEQ/100ML IV SOLN
10.0000 meq | Freq: Once | INTRAVENOUS | Status: AC
  Administered 2013-10-20: 10 meq via INTRAVENOUS
  Filled 2013-10-20: qty 100

## 2013-10-20 NOTE — ED Provider Notes (Addendum)
Medical screening examination/treatment/procedure(s) were conducted as a shared visit with non-physician practitioner(s) and myself.  I personally evaluated the patient during the encounter.   EKG Interpretation   Date/Time:  Monday October 20 2013 16:35:46 EDT Ventricular Rate:  83 PR Interval:  158 QRS Duration: 82 QT Interval:  398 QTC Calculation: 467 R Axis:   81 Text Interpretation:  Sinus rhythm with marked sinus arrhythmia Septal  infarct , age undetermined Abnormal ECG Confirmed by WARD,  DO, KRISTEN  [phone]) on 10/20/2013 4:53:27 PM      Pt is a 63 y.o. M history of lung cancer with mets to the brain who presents emergency department with melena that started Saturday, 2 days ago. No fevers, chills, nausea or vomiting, hematochezia, abdominal pain. On exam, patient is well-appearing, abdominal exam benign. Pleasant, no distress. Patient has brown stool on exam today that is guaiac negative. His hemoglobin is 12.8 which is stable. He has been hemodynamically stable.  He is slightly hyponatremic, will replace.  EKG shows no interval changes. I feel he is safe to be discharged home with outpatient followup. Patient comfortable with plan.  [place], DO 10/20/13 [place], DO 10/20/13 1656   Patient had a 35 second generalized tonic clonic seizure. He is not postictal but hemodynamically stable, protecting his airway. Likely because he has metastatic disease to his brain. We'll give Keppra and discuss with neurology. Will obtain a head CT.    Head CT is unremarkable for metastatic disease.  Discussed with Dr. Nicole Kindred recommended Keppra 500 mg twice daily. Patient is having a prolonged post ictal period and not returning to baseline appropriately. Will admit for observation.  [place], DO 10/20/13 2125

## 2013-10-20 NOTE — ED Provider Notes (Signed)
CSN: [id number]     Arrival date & time 10/20/13  1410 History   First MD Initiated Contact with Patient 10/20/13 1505     Chief Complaint  Patient presents with  . Melena     (Consider location/radiation/quality/duration/timing/severity/associated sxs/prior Treatment) HPI Comments: Patient with a history of Lung Cancer with mets to the brain presents today with a chief complaint of melena.  He reports that he has had black tarry loose stool over the past 2 days.   He states that he has had several episodes of diarrhea daily.  He denies vomiting,hematochezia, hematemesis, or abdominal pain.  He denies use of any new medications.  He takes 81 mg ASA, but no other anticoagulants.  Denies chest pain, SOB, cough, fever, or chills.  Denies dizziness or lightheadedness. He denies any prior history of GI bleed.  No prior history of Diverticulosis.  He is currently followed by Dr. Inda Merlin with Oncology.  He reports that he completed chemotherapy in July of 2014.  He reports that he completed radiation in June of 2015.    The history is provided by the patient.    Past Medical History  Diagnosis Date  . BENIGN PROSTATIC HYPERTROPHY 12/24/2009  . CEREBROVASCULAR ACCIDENT, HX OF 12/24/2009  . HYPERLIPIDEMIA 12/24/2009  . HYPERTENSION 12/24/2009  . Testosterone deficiency   . ED (erectile dysfunction)   . Claudication in peripheral vascular disease, lifestyle limiting, with abnormal arterial dopplers of lower ext. 06/30/2011  . S/P angioplasty with stent to Lt. SFA 06/30/11 06/30/2011  . COPD 12/24/2009  . Shortness of breath     "from the COPD" (10/13/2011)  . Stroke 1996    "right side not as strong as left since; numbness R leg/foot" (10/13/2011)  . Pernicious anemia   . Complication of anesthesia     hard to wake up  . Pneumonia   . 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 Brain metastases 05/22/13    ct head   . History of radiation therapy 06/02/13-06/16/13    brain   Past  Surgical History  Procedure Laterality Date  . Cervical fusion  1980's    "total of 3-4 OR's on my neck; pinched nerve" (10/13/2011)  . Aneurysm coiling  2007  . Peripheral arterial stent graft  06/30/2010; 10/13/10    left; left  . Video bronchoscopy Bilateral 04/03/2012    Procedure: VIDEO BRONCHOSCOPY WITHOUT FLUORO;  Surgeon: Tanda Rockers, MD;  Location: [facility];  Service: Cardiopulmonary;  Laterality: Bilateral;  . Amputation Left 08/26/2012    Procedure: IRRIGATION AND DEBRIDMENT LEFT DORSAL ABSCESS/ 4TH AND 5TH LEFT RAY AMPUTATION ;  Surgeon: Wylene Simmer, MD;  Location: [facility];  Service: Orthopedics;  Laterality: Left;  . Femoral-popliteal bypass graft Left 09/03/2012    Procedure: BYPASS GRAFT FEMORAL-POPLITEAL ARTERY;  Surgeon: Serafina Mitchell, MD;  Location: [facility];  Service: Vascular;  Laterality: Left;  . Cardiac catheterization    . Colonoscopy      hx: of  . I&d extremity Left 09/26/2012    Procedure: IRRIGATION AND DEBRIDEMENT EXTREMITY- LEFT FOOT WITH WOUND VAC PLACEMENT;  Surgeon: Serafina Mitchell, MD;  Location: [facility];  Service: Vascular;  Laterality: Left;   Family History  Problem Relation Age of Onset  . Emphysema Father     smoked  . Emphysema Paternal Uncle     smoked  . Heart disease Mother   . Lung cancer Father     smoked  . Lung  cancer Paternal Grandfather     smoked   History  Substance Use Topics  . Smoking status: Current Every Day Smoker -- 0.75 packs/day for 45 years    Types: Cigarettes  . Smokeless tobacco: Never Used     Comment: cutting back  . Alcohol Use: No    Review of Systems  All other systems reviewed and are negative.     Allergies  Review of patient's allergies indicates no known allergies.  Home Medications   Prior to Admission medications   Medication Sig Start Date End Date Taking? Authorizing Provider  albuterol (PROVENTIL HFA;VENTOLIN HFA) 108 (90 BASE) MCG/ACT inhaler Inhale 2 puffs into the lungs every 4 (four)  hours as needed for wheezing or shortness of breath.   Yes Historical Provider, MD  aspirin 81 MG tablet Take 81 mg by mouth at bedtime.    Yes Historical Provider, MD  atorvastatin (LIPITOR) 20 MG tablet Take 1 tablet (20 mg total) by mouth daily. 06/20/13  Yes Marletta Lor, MD  gabapentin (NEURONTIN) 600 MG tablet Take 600 mg by mouth daily with breakfast.   Yes Historical Provider, MD  nortriptyline (PAMELOR) 75 MG capsule Take 75 mg by mouth at bedtime.   Yes Historical Provider, MD  ondansetron (ZOFRAN-ODT) 4 MG disintegrating tablet Take 4 mg by mouth every 4 (four) hours as needed for nausea or vomiting.   Yes Historical Provider, MD  pneumococcal 13-valent conjugate vaccine (PREVNAR 13) SUSP injection Inject 0.5 mLs into the muscle once.   Yes Historical Provider, MD  tamsulosin (FLOMAX) 0.4 MG CAPS capsule Take 1 capsule (0.4 mg total) by mouth daily. 06/20/13  Yes Marletta Lor, MD   BP 161/75  Pulse 83  Temp(Src) 98.5 F (36.9 C) (Oral)  Resp 18  SpO2 89% Physical Exam  Nursing note and vitals reviewed. Constitutional: He is oriented to person, place, and time. He appears well-developed and well-nourished. No distress.  HENT:  Head: Normocephalic and atraumatic.  Mouth/Throat: Oropharynx is clear and moist.  Eyes: EOM are normal.  Neck: Normal range of motion. Neck supple.  Cardiovascular: Normal rate, regular rhythm and normal heart sounds.   Pulmonary/Chest: Effort normal and breath sounds normal.  Abdominal: Soft. Bowel sounds are normal. He exhibits no distension and no mass. There is no tenderness. There is no rebound and no guarding.  Genitourinary: Rectal exam shows no external hemorrhoid. Guaiac negative stool.  Stool appears brown in color with rectal exam  Musculoskeletal: Normal range of motion.  Neurological: He is alert and oriented to person, place, and time.  Skin: Skin is warm and dry. He is not diaphoretic.  Psychiatric: He has a normal mood and  affect.    ED Course  Procedures (including critical care time) Labs Review Labs Reviewed  CBC - Abnormal; Notable for the following:    RBC 4.15 (*)    Hemoglobin 12.4 (*)    HCT 37.1 (*)    All other components within normal limits  COMPREHENSIVE METABOLIC PANEL  POC OCCULT BLOOD, ED    Imaging Review    EKG Interpretation None     7:19 PM Informed by RN that patient had a generalized tonic clonic seizure for approximately 35 seconds.  Patient now appears confused and anxious.  Keppra and Ativan ordered.  Will obtain CT head. 9:25 PM Reassessed patient.  Patient continues to be very confused, which he was not initially.  He is pulling off his oxygen and trying to get out of bed.  Will order another dose of Ativan IV and consult Neurology.  Will admit the patient to [facility]. 9:29 PM Discussed with Dr. Nicole Kindred with Neurology.  He recommends Keppra 500 mg BID and admit to Hospitalist for observation. MDM   Final diagnoses:  None   Patient with a history of Lung Cancer with mets to the brain presents today with a chief complaint of melena for the past couple of days.  In the ED his stool is brown in color and hemoccult negative.  He is hemodynamically stable.  No abdominal pain.  During ED course the patient had a seizure.  Seizure was witnessed by RN and RN reported a generalized tonic clonic seizure that lasted approximately 35 seconds.  Patient given Keppra and Ativan IV in the ED.  No acute findings on Head CT.  No prior history of Seizures.  Seizure was likely secondary to brain mets.  Patient continued to be post ictal for 2 hours following the seizure.  Neurology consulted and recommended continuing the [place].  Patient admitted to [facility] for further management and treatment.    Hyman Bible, PA-C 10/22/13 1430

## 2013-10-20 NOTE — H&P (Addendum)
[facility] History and Physical  [name] ZOX:[id number] DOB: 18-Jan-1950 DOA: 10/20/2013  Referring physician: ER physician. PCP: Nyoka Cowden, MD   Chief Complaint: Dark stools.  History obtained from ER physician and patient's family as patient is post ictal at this time.  HPI: [name] is a 63 y.o. male with history of metastatic non-small cell lung cancer who was on chemotherapy previously and currently under observation and has had received brain radiation and was recently admitted for pneumonia and was on antibiotics was brought to the ER by patient's family as patient has been having diarrhea for last 2 days with dark stools. Patient did not have any nausea vomiting or abdominal pain. In the ER hemoglobin was around 12 and stool for blood was negative. While waiting in the ER patient had brief episode of tonic-clonic seizures lasted for around 35 seconds. Was witnessed by the patient's nurse. Patient's seizure stopped spontaneously before any medication was given. CT head did not show anything acute. On-call neurologist was consulted by the ED physician and at this time patient was brought on [place] and admitted for further management. After the seizure patient was unconscious for a few minutes later on patient regained consciousness but was confused and agitated and was given Ativan. Patient presently is post ictal.   Review of Systems: As presented in the history of presenting illness, rest negative.  Past Medical History  Diagnosis Date  . BENIGN PROSTATIC HYPERTROPHY 12/24/2009  . CEREBROVASCULAR ACCIDENT, HX OF 12/24/2009  . HYPERLIPIDEMIA 12/24/2009  . HYPERTENSION 12/24/2009  . Testosterone deficiency   . ED (erectile dysfunction)   . Claudication in peripheral vascular disease, lifestyle limiting, with abnormal arterial dopplers of lower ext. 06/30/2011  . S/P angioplasty with stent to Lt. SFA 06/30/11 06/30/2011  . COPD 12/24/2009  . Shortness of  breath     "from the COPD" (10/13/2011)  . Stroke 1996    "right side not as strong as left since; numbness R leg/foot" (10/13/2011)  . Pernicious anemia   . Complication of anesthesia     hard to wake up  . Pneumonia   . 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 Brain metastases 05/22/13    ct head   . History of radiation therapy 06/02/13-06/16/13    brain   Past Surgical History  Procedure Laterality Date  . Cervical fusion  1980's    "total of 3-4 OR's on my neck; pinched nerve" (10/13/2011)  . Aneurysm coiling  2007  . Peripheral arterial stent graft  06/30/2010; 10/13/10    left; left  . Video bronchoscopy Bilateral 04/03/2012    Procedure: VIDEO BRONCHOSCOPY WITHOUT FLUORO;  Surgeon: Tanda Rockers, MD;  Location: [facility];  Service: Cardiopulmonary;  Laterality: Bilateral;  . Amputation Left 08/26/2012    Procedure: IRRIGATION AND DEBRIDMENT LEFT DORSAL ABSCESS/ 4TH AND 5TH LEFT RAY AMPUTATION ;  Surgeon: Wylene Simmer, MD;  Location: [facility];  Service: Orthopedics;  Laterality: Left;  . Femoral-popliteal bypass graft Left 09/03/2012    Procedure: BYPASS GRAFT FEMORAL-POPLITEAL ARTERY;  Surgeon: Serafina Mitchell, MD;  Location: [facility];  Service: Vascular;  Laterality: Left;  . Cardiac catheterization    . Colonoscopy      hx: of  . I&d extremity Left 09/26/2012    Procedure: IRRIGATION AND DEBRIDEMENT EXTREMITY- LEFT FOOT WITH WOUND VAC PLACEMENT;  Surgeon: Serafina Mitchell, MD;  Location: [facility];  Service: Vascular;  Laterality: Left;   Social History:  reports that he has been smoking Cigarettes.  He has a 33.75 pack-year smoking history. He has never used smokeless tobacco. He reports that he does not drink alcohol or use illicit drugs. Where does patient live home. Can patient participate in ADLs? Yes.  No Known Allergies  Family History:  Family History  Problem Relation Age of Onset  . Emphysema Father     smoked  . Emphysema Paternal Uncle     smoked  .  Heart disease Mother   . Lung cancer Father     smoked  . Lung cancer Paternal Grandfather     smoked      Prior to Admission medications   Medication Sig Start Date End Date Taking? Authorizing Provider  albuterol (PROVENTIL HFA;VENTOLIN HFA) 108 (90 BASE) MCG/ACT inhaler Inhale 2 puffs into the lungs every 4 (four) hours as needed for wheezing or shortness of breath.   Yes Historical Provider, MD  aspirin 81 MG tablet Take 81 mg by mouth at bedtime.    Yes Historical Provider, MD  atorvastatin (LIPITOR) 20 MG tablet Take 1 tablet (20 mg total) by mouth daily. 06/20/13  Yes Marletta Lor, MD  gabapentin (NEURONTIN) 600 MG tablet Take 600 mg by mouth daily with breakfast.   Yes Historical Provider, MD  nortriptyline (PAMELOR) 75 MG capsule Take 75 mg by mouth at bedtime.   Yes Historical Provider, MD  ondansetron (ZOFRAN-ODT) 4 MG disintegrating tablet Take 4 mg by mouth every 4 (four) hours as needed for nausea or vomiting.   Yes Historical Provider, MD  pneumococcal 13-valent conjugate vaccine (PREVNAR 13) SUSP injection Inject 0.5 mLs into the muscle once.   Yes Historical Provider, MD  tamsulosin (FLOMAX) 0.4 MG CAPS capsule Take 1 capsule (0.4 mg total) by mouth daily. 06/20/13  Yes Marletta Lor, MD    Physical Exam: Filed Vitals:   10/20/13 1536 10/20/13 1600 10/20/13 1817 10/20/13 2019  BP: 161/75 161/83 158/94 146/88  Pulse: 83 83 97 93  Temp: 98.5 F (36.9 C)   98.1 F (36.7 C)  TempSrc: Oral   Oral  Resp: 18   16  SpO2: 89% 92% 94% 95%     General:  Moderately built and nourished.  Eyes: Anicteric no pallor. PERRLA positive.  ENT: No discharge from the ears eyes nose mouth.  Neck: No mass felt. No neck rigidity.  Cardiovascular: S1-S2 heard.  Respiratory: No rhonchi or crepitations.  Abdomen: Soft nontender bowel sounds present.  Skin: No rash.  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  Psychiatric: Patient is  postictal.  Neurologic: Patient is postictal and difficult to have further neurological assessment.  Labs on Admission:  Basic Metabolic Panel:  Recent Labs Lab 10/20/13 1507  NA 131*  K 2.9*  CL 93*  CO2 28  GLUCOSE 84  BUN 7  CREATININE 0.60  CALCIUM 8.8   Liver Function Tests:  Recent Labs Lab 10/20/13 1507  AST 12  ALT 9  ALKPHOS 101  BILITOT 0.6  PROT 6.4  ALBUMIN 3.0*   No results found for this basename: LIPASE, AMYLASE,  in the last 168 hours No results found for this basename: AMMONIA,  in the last 168 hours CBC:  Recent Labs Lab 10/20/13 1507  WBC 5.2  HGB 12.4*  HCT 37.1*  MCV 89.4  PLT 256   Cardiac Enzymes: No results found for this basename: CKTOTAL, CKMB, CKMBINDEX, TROPONINI,  in the last 168 hours  BNP (last 3 results) No results  found for this basename: PROBNP,  in the last 8760 hours CBG: No results found for this basename: GLUCAP,  in the last 168 hours  Radiological Exams on Admission: Ct Head Wo Contrast  10/20/2013   CLINICAL DATA:  Altered mental status following a seizure. No history of seizures. Completed whole brain radiation therapy on 06/16/2013 for lung cancer with brain metastases.  EXAM: CT HEAD WITHOUT CONTRAST  TECHNIQUE: Contiguous axial images were obtained from the base of the skull through the vertex without intravenous contrast.  COMPARISON:  Brain MR dated 09/09/2013 and head CT dated 05/22/2013.  FINDINGS: The previously demonstrated 2.5 x 2.5 cm low density component of the left frontal lobe mass is smaller and less rounded without the previously associated surrounding soft tissue component. The residual low density at this location measures 2.0 x 1.7 cm on image number 27. The previously seen 2.7 x 2.3 cm right parietal occipital mass with central low density has resolved with wedge shaped low density currently seen at that location involving the gray and white matter. The previously demonstrated right temporal mass has  been replaced with an area of lower density. No new masses are seen and no intracranial hemorrhage or CT evidence of acute infarction. An old left thalamic lacunar infarct is unchanged. There is interval extensive confluent white matter low density in both cerebral hemispheres. Unremarkable bones. 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  IMPRESSION: 1. No acute abnormality. 2. Treated metastases, as described above. 3. Postradiation cerebral white matter changes.   Electronically Signed   By: Enrique Sack M.D.   On: 10/20/2013 20:58     Assessment/Plan Principal Problem:   Seizure Active Problems:   Non-small cell carcinoma of left lung, stage 4   Diarrhea   1. Seizures in a patient with known history of metastatic non-small lung cancer to the brain - I have discussed with on-call neurologist Dr. Nicole Kindred. Dr. Nicole Kindred has advised to continue with Keppra and get MRI brain with contrast in the morning to further assess patient's brain metastases. Dr. Nicole Kindred was advised that it is not necessary to get EEG at this time but if patient's mental status does not improve then to reconsult neurology. Closely observe in step down unit overnight. When necessary Ativan for any agitation or seizure like activity. 2. Diarrhea with dark stools - since patient has been recently on antibiotics check stool for C. difficile. Closely follow CBC for any worsening of hemoglobin. Stool for occult blood was negative. 3. Hypokalemia - probably from diarrhea. Replace and recheck. Check Magnesium. 4. History of non-small cell lung cancer stage IV with metastases - patient's recent CAT scan of the chest showed mild progression. Dr. Julien Nordmann is following up. Further recommendations per oncologist. 5. COPD - presently not wheezing. 6. History of intracranial aneurysm status post coiling. 7. Anemia - follow CBC.    Code Status: Full code.  Family  Communication: Patient's wife and son at the bedside.  Disposition Plan: Admit to inpatient.    Alainna Stawicki N. [facility] Pager [phone].  If 7PM-7AM, please contact night-coverage [email or web address] Password [email or web address] 10/20/2013, 10:06 PM

## 2013-10-20 NOTE — ED Notes (Signed)
Pt has been having black stool since saturday, denies abd pain. Pt alos c/o weakness. CA pt.

## 2013-10-20 NOTE — Telephone Encounter (Signed)
Patient Information:  Caller Name: Caren Griffins  Phone: [phone]  Patient: [name], [name]  Gender: Male  DOB: 02-08-1950  Age: 63 Years  PCP: Bluford Kaufmann (Family Practice > 52yrs old)  Office Follow Up:  Does the office need to follow up with this patient?: Yes  Instructions For The Office: Please advise this pt. if Dr. Raliegh Ip will see him in the office or if they should go to the ED for rectal bleeding.  RN Note:  Will send an Urgent message to the office to see if Dr. Raliegh Ip wants to see him or have him go to the hospital ED.  Symptoms  Reason For Call & Symptoms: S/P cancer pt. Diarrhea started 10/18/13 and was black. No Pepto. Abdominal pain is mild. No vomiting. Lung cancer. Takes ASA 81mg . daily. Has had a total of 6 stools since this started and all black with a bad smell.  Reviewed Health History In EMR: Yes  Reviewed Medications In EMR: Yes  Reviewed Allergies In EMR: Yes  Reviewed Surgeries / Procedures: Yes  Date of Onset of Symptoms: 10/18/2013  Guideline(s) Used:  Rectal Bleeding  Disposition Per Guideline:   Go to ED Now (or to Office with PCP Approval)  Reason For Disposition Reached:   Bloody, black, or tarry bowel movements  Advice Given:  Call Back If:  Bleeding increases in amount  Patient Will Follow Care Advice:  YES

## 2013-10-20 NOTE — ED Notes (Signed)
IV pump not available. ED Secretary called for IV pumps. Unable to hang Potassium at this time.

## 2013-10-20 NOTE — Telephone Encounter (Signed)
Spoke to pt's wife, told her discussed with Dr. Raliegh Ip , needs to go to the ED to be evaluated.

## 2013-10-20 NOTE — ED Notes (Signed)
Critical Potassium 2.9.

## 2013-10-21 ENCOUNTER — Inpatient Hospital Stay (HOSPITAL_COMMUNITY)
Admission: EM | Admit: 2013-10-21 | Discharge: 2013-10-21 | Disposition: A | Discharge status: Home / Self Care | Payer: Medicare Other | Source: Home / Self Care | Attending: Internal Medicine | Admitting: Internal Medicine

## 2013-10-21 ENCOUNTER — Encounter (HOSPITAL_COMMUNITY): Payer: Self-pay | Admitting: Radiology

## 2013-10-21 ENCOUNTER — Telehealth: Payer: Self-pay | Admitting: Internal Medicine

## 2013-10-21 ENCOUNTER — Inpatient Hospital Stay (HOSPITAL_COMMUNITY): Payer: Medicare Other

## 2013-10-21 DIAGNOSIS — E860 Dehydration: Secondary | ICD-10-CM | POA: Diagnosis present

## 2013-10-21 DIAGNOSIS — G4089 Other seizures: Principal | ICD-10-CM

## 2013-10-21 DIAGNOSIS — G9340 Encephalopathy, unspecified: Secondary | ICD-10-CM | POA: Diagnosis present

## 2013-10-21 DIAGNOSIS — J449 Chronic obstructive pulmonary disease, unspecified: Secondary | ICD-10-CM | POA: Diagnosis present

## 2013-10-21 DIAGNOSIS — Z825 Family history of asthma and other chronic lower respiratory diseases: Secondary | ICD-10-CM | POA: Diagnosis not present

## 2013-10-21 DIAGNOSIS — Z981 Arthrodesis status: Secondary | ICD-10-CM | POA: Diagnosis not present

## 2013-10-21 DIAGNOSIS — Z515 Encounter for palliative care: Secondary | ICD-10-CM | POA: Diagnosis not present

## 2013-10-21 DIAGNOSIS — C3492 Malignant neoplasm of unspecified part of left bronchus or lung: Secondary | ICD-10-CM | POA: Diagnosis present

## 2013-10-21 DIAGNOSIS — Z8673 Personal history of transient ischemic attack (TIA), and cerebral infarction without residual deficits: Secondary | ICD-10-CM | POA: Diagnosis not present

## 2013-10-21 DIAGNOSIS — Z8249 Family history of ischemic heart disease and other diseases of the circulatory system: Secondary | ICD-10-CM | POA: Diagnosis not present

## 2013-10-21 DIAGNOSIS — R4020 Unspecified coma: Secondary | ICD-10-CM

## 2013-10-21 DIAGNOSIS — R1310 Dysphagia, unspecified: Secondary | ICD-10-CM | POA: Diagnosis present

## 2013-10-21 DIAGNOSIS — E785 Hyperlipidemia, unspecified: Secondary | ICD-10-CM | POA: Diagnosis present

## 2013-10-21 DIAGNOSIS — C7931 Secondary malignant neoplasm of brain: Secondary | ICD-10-CM

## 2013-10-21 DIAGNOSIS — Z681 Body mass index (BMI) 19 or less, adult: Secondary | ICD-10-CM | POA: Diagnosis not present

## 2013-10-21 DIAGNOSIS — B370 Candidal stomatitis: Secondary | ICD-10-CM | POA: Diagnosis present

## 2013-10-21 DIAGNOSIS — F17210 Nicotine dependence, cigarettes, uncomplicated: Secondary | ICD-10-CM | POA: Diagnosis present

## 2013-10-21 DIAGNOSIS — D649 Anemia, unspecified: Secondary | ICD-10-CM | POA: Diagnosis present

## 2013-10-21 DIAGNOSIS — E871 Hypo-osmolality and hyponatremia: Secondary | ICD-10-CM | POA: Diagnosis present

## 2013-10-21 DIAGNOSIS — I5031 Acute diastolic (congestive) heart failure: Secondary | ICD-10-CM | POA: Diagnosis present

## 2013-10-21 DIAGNOSIS — R64 Cachexia: Secondary | ICD-10-CM | POA: Diagnosis present

## 2013-10-21 DIAGNOSIS — I10 Essential (primary) hypertension: Secondary | ICD-10-CM | POA: Diagnosis present

## 2013-10-21 DIAGNOSIS — Z7982 Long term (current) use of aspirin: Secondary | ICD-10-CM | POA: Diagnosis not present

## 2013-10-21 DIAGNOSIS — Z8701 Personal history of pneumonia (recurrent): Secondary | ICD-10-CM | POA: Diagnosis not present

## 2013-10-21 DIAGNOSIS — E876 Hypokalemia: Secondary | ICD-10-CM | POA: Diagnosis present

## 2013-10-21 DIAGNOSIS — Z923 Personal history of irradiation: Secondary | ICD-10-CM | POA: Diagnosis not present

## 2013-10-21 DIAGNOSIS — Z801 Family history of malignant neoplasm of trachea, bronchus and lung: Secondary | ICD-10-CM | POA: Diagnosis not present

## 2013-10-21 DIAGNOSIS — C3410 Malignant neoplasm of upper lobe, unspecified bronchus or lung: Secondary | ICD-10-CM

## 2013-10-21 DIAGNOSIS — R627 Adult failure to thrive: Secondary | ICD-10-CM | POA: Diagnosis present

## 2013-10-21 DIAGNOSIS — R451 Restlessness and agitation: Secondary | ICD-10-CM | POA: Diagnosis present

## 2013-10-21 DIAGNOSIS — Z66 Do not resuscitate: Secondary | ICD-10-CM | POA: Diagnosis present

## 2013-10-21 LAB — GLUCOSE, CAPILLARY
Glucose-Capillary: 67 mg/dL — ABNORMAL LOW (ref 70–99)
Glucose-Capillary: 134 mg/dL — ABNORMAL HIGH (ref 70–99)
Glucose-Capillary: 84 mg/dL (ref 70–99)
Glucose-Capillary: 86 mg/dL (ref 70–99)
Glucose-Capillary: 88 mg/dL (ref 70–99)

## 2013-10-21 LAB — COMPREHENSIVE METABOLIC PANEL
ALT: 9 U/L (ref 0–53)
AST: 15 U/L (ref 0–37)
Albumin: 2.9 g/dL — ABNORMAL LOW (ref 3.5–5.2)
Alkaline Phosphatase: 96 U/L (ref 39–117)
Anion gap: 10 (ref 5–15)
BILIRUBIN TOTAL: 0.5 mg/dL (ref 0.3–1.2)
BUN: 6 mg/dL (ref 6–23)
CALCIUM: 8.5 mg/dL (ref 8.4–10.5)
CHLORIDE: 98 meq/L (ref 96–112)
CO2: 25 meq/L (ref 19–32)
Creatinine, Ser: 0.56 mg/dL (ref 0.50–1.35)
Glucose, Bld: 84 mg/dL (ref 70–99)
Potassium: 5 mEq/L (ref 3.7–5.3)
SODIUM: 133 meq/L — AB (ref 137–147)
Total Protein: 6.3 g/dL (ref 6.0–8.3)

## 2013-10-21 LAB — VITAMIN B12: Vitamin B-12: 584 pg/mL (ref 211–911)

## 2013-10-21 LAB — BASIC METABOLIC PANEL
Anion gap: 11 (ref 5–15)
BUN: 5 mg/dL — ABNORMAL LOW (ref 6–23)
CO2: 23 meq/L (ref 19–32)
Calcium: 8.6 mg/dL (ref 8.4–10.5)
Chloride: 99 mEq/L (ref 96–112)
Creatinine, Ser: 0.56 mg/dL (ref 0.50–1.35)
GFR calc non Af Amer: >90 mL/min (ref >90)
Glucose, Bld: 78 mg/dL (ref 70–99)
POTASSIUM: 5.6 meq/L — AB (ref 3.7–5.3)
SODIUM: 133 meq/L — AB (ref 137–147)

## 2013-10-21 LAB — URINALYSIS, ROUTINE W REFLEX MICROSCOPIC
BILIRUBIN URINE: NEGATIVE
Glucose, UA: NEGATIVE mg/dL
HGB URINE DIPSTICK: NEGATIVE
Ketones, ur: NEGATIVE mg/dL
Leukocytes, UA: NEGATIVE
Nitrite: NEGATIVE
PH: 6 (ref 5.0–8.0)
Protein, ur: NEGATIVE mg/dL
SPECIFIC GRAVITY, URINE: 1.013 (ref 1.005–1.030)
Urobilinogen, UA: 0.2 mg/dL (ref 0.0–1.0)

## 2013-10-21 LAB — CBC
HCT: 40.4 % (ref 39.0–52.0)
HCT: 42.7 % (ref 39.0–52.0)
HEMOGLOBIN: 13.8 g/dL (ref 13.0–17.0)
HEMOGLOBIN: 13.9 g/dL (ref 13.0–17.0)
MCH: 30.4 pg (ref 26.0–34.0)
MCH: 29.3 pg (ref 26.0–34.0)
MCHC: 34.2 g/dL (ref 30.0–36.0)
MCHC: 32.6 g/dL (ref 30.0–36.0)
MCV: 89 fL (ref 78.0–100.0)
MCV: 90.1 fL (ref 78.0–100.0)
Platelets: 268 10*3/uL (ref 150–400)
Platelets: 244 10*3/uL (ref 150–400)
RBC: 4.54 MIL/uL (ref 4.22–5.81)
RBC: 4.74 MIL/uL (ref 4.22–5.81)
RDW: 15.2 % (ref 11.5–15.5)
RDW: 15 % (ref 11.5–15.5)
WBC: 7.4 10*3/uL (ref 4.0–10.5)
WBC: 6.1 10*3/uL (ref 4.0–10.5)

## 2013-10-21 LAB — RAPID URINE DRUG SCREEN, HOSP PERFORMED
AMPHETAMINES: NOT DETECTED
BENZODIAZEPINES: NOT DETECTED
Barbiturates: NOT DETECTED
COCAINE: NOT DETECTED
Opiates: NOT DETECTED
TETRAHYDROCANNABINOL: NOT DETECTED

## 2013-10-21 LAB — TSH: TSH: 0.566 u[IU]/mL (ref 0.350–4.500)

## 2013-10-21 LAB — AMMONIA: AMMONIA: 59 umol/L (ref 11–60)

## 2013-10-21 MED ORDER — IPRATROPIUM-ALBUTEROL 0.5-2.5 (3) MG/3ML IN SOLN
3.0000 mL | Freq: Four times a day (QID) | RESPIRATORY_TRACT | Status: DC
  Administered 2013-10-21 – 2013-10-23 (×8): 3 mL via RESPIRATORY_TRACT
  Filled 2013-10-21 (×8): qty 3

## 2013-10-21 MED ORDER — LORAZEPAM 2 MG/ML IJ SOLN
1.0000 mg | INTRAMUSCULAR | Status: DC | PRN
  Administered 2013-10-23 – 2013-10-24 (×5): 1 mg via INTRAVENOUS
  Filled 2013-10-21 (×7): qty 1

## 2013-10-21 MED ORDER — ENSURE PUDDING PO PUDG: 1.0000 | Freq: Three times a day (TID) | ORAL | Status: DC

## 2013-10-21 MED ORDER — LORAZEPAM 2 MG/ML IJ SOLN
1.0000 mg | Freq: Once | INTRAMUSCULAR | Status: AC
  Administered 2013-10-21: 1 mg via INTRAVENOUS

## 2013-10-21 MED ORDER — ENSURE COMPLETE PO LIQD: 237.0000 mL | Freq: Every day | ORAL | Status: DC

## 2013-10-21 MED ORDER — DEXTROSE-NACL 5-0.9 % IV SOLN
INTRAVENOUS | Status: DC
  Administered 2013-10-21 – 2013-10-25 (×7): via INTRAVENOUS
  Filled 2013-10-21 (×11): qty 1000

## 2013-10-21 MED ORDER — CHLORHEXIDINE GLUCONATE 0.12 % MT SOLN
15.0000 mL | Freq: Two times a day (BID) | OROMUCOSAL | Status: DC
  Administered 2013-10-21 – 2013-10-26 (×10): 15 mL via OROMUCOSAL
  Filled 2013-10-21 (×8): qty 15

## 2013-10-21 MED ORDER — DEXTROSE 50 % IV SOLN
INTRAVENOUS | Status: AC
  Filled 2013-10-21: qty 50

## 2013-10-21 MED ORDER — CETYLPYRIDINIUM CHLORIDE 0.05 % MT LIQD
7.0000 mL | Freq: Two times a day (BID) | OROMUCOSAL | Status: DC
  Administered 2013-10-21 – 2013-10-26 (×10): 7 mL via OROMUCOSAL

## 2013-10-21 MED ORDER — ENSURE PUDDING PO PUDG
1.0000 | ORAL | Status: DC | PRN
  Filled 2013-10-21: qty 1

## 2013-10-21 MED ORDER — ASPIRIN 300 MG RE SUPP
300.0000 mg | Freq: Once | RECTAL | Status: AC
  Administered 2013-10-21: 300 mg via RECTAL
  Filled 2013-10-21: qty 1

## 2013-10-21 NOTE — Progress Notes (Signed)
EEG completed; results pending.    

## 2013-10-21 NOTE — Progress Notes (Signed)
[name]   DOB:September 20, 1950   IE#:[id number]   ZYS#:[id number]  Patient Care Team: Marletta Lor, MD as PCP - General Linna Hoff, MD as Consulting Physician (Orthopedic Surgery) Curt Bears, MD as Consulting Physician (Oncology)  Subjective: Patient seen and examined. Mr Baskett is a 63 year old man with a history of metastatic non-small cell carcinoma as detailed below, who was admitted on 10/20/13 with 2 day history of diarrhea with dark stools following a recent hospitalization for pneumonia from 9/10 to 9/12. his status was complicated by an episode of tonic clonic seizures. He was placed on keppra by Neurology.   CT of the head without contrast was negative for acute findings other than known metastatic disease which appeared stable. MRI head was planned but patient  had episodes of unconsciousness and agitation, for which he admitted to the ICU.  Patient continues to be unresponsive to sound, but responsive to touch. Unable to obtain further history.  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   PRIOR THERAPY:  1) Systemic chemotherapy with carboplatin for an AUC of 5 given on day 1 and Abraxane at 100 mg per meter squared given on days 1, 8 and 15 every 3 weeks. Status post 4 cycles, as well as day 1 of cycle 5.  2) status post whole brain irradiation completed on 06/16/2013 under the care of Dr. Lisbeth Renshaw.   CURRENT THERAPY: Observation.   Scheduled Meds: . antiseptic oral rinse  7 mL Mouth Rinse q12n4p  . aspirin EC  81 mg Oral QHS  . atorvastatin  20 mg Oral q1800  . chlorhexidine  15 mL Mouth Rinse BID  . dextrose      . enoxaparin (LOVENOX) injection  40 mg Subcutaneous QHS  . gabapentin  600 mg Oral Q breakfast  . levETIRAcetam  500 mg Intravenous BID  . nortriptyline  75 mg Oral QHS  . tamsulosin  0.4 mg Oral Daily   Continuous Infusions: . 0.9 % NaCl with KCl 20 mEq / L 75 mL/hr  (10/21/13 0025)   PRN Meds:acetaminophen, acetaminophen, albuterol, LORazepam, ondansetron (ZOFRAN) IV, ondansetron, ondansetron   Objective:  Filed Vitals:   10/21/13 0800  BP:   Pulse:   Temp: 97.6 F (36.4 C)  Resp:       Intake/Output Summary (Last 24 hours) at 10/21/13 0945 Last data filed at 10/21/13 0800  Gross per 24 hour  Intake    900 ml  Output   1175 ml  Net   -275 ml    ECOG PERFORMANCE STATUS:  GENERAL: eyes closed, unconscious, has periods of agitation. ill appearing SKIN: skin color, texture, turgor are normal, no rashes or significant lesions OROPHARYNX:no exudate, no erythema and lips, buccal mucosa, and tongue norma. edentulous  NECK: supple, thyroid normal size, non-tender, without nodularity LYMPH:  no palpable lymphadenopathy in the cervical, axillary or inguinal LUNGS: clear to auscultation and percussion with normal breathing effort HEART: regular rate & rhythm and no murmurs and no lower extremity edema ABDOMEN:abdomen soft, non-tender and normal bowel sounds Musculoskeletal:no cyanosis of digits and no clubbing  PSYCH: alert & oriented x 3 with fluent speech NEURO: no focal motor/sensory deficits    CBG (last 3)   Recent Labs  10/21/13 0423 10/21/13 0523 10/21/13 0756  GLUCAP 67* 134* 84     Labs:   Recent Labs Lab 10/20/13 1507 10/21/13 0220 10/21/13 0645  WBC 5.2 7.4 6.1  HGB 12.4*  13.8 13.9  HCT 37.1* 40.4 42.7  PLT 256 268 244  MCV 89.4 89.0 90.1  MCH 29.9 30.4 29.3  MCHC 33.4 34.2 32.6  RDW 15.1 15.2 15.0     Chemistries:    Recent Labs Lab 10/20/13 1507 10/20/13 2330 10/21/13 0220  NA 131*  --  133*  K 2.9*  --  5.0  CL 93*  --  98  CO2 28  --  25  GLUCOSE 84  --  84  BUN 7  --  6  CREATININE 0.60  --  0.56  CALCIUM 8.8  --  8.5  MG  --  1.5  --   AST 12  --  15  ALT 9  --  9  ALKPHOS 101  --  96  BILITOT 0.6  --  0.5    GFR Estimated Creatinine Clearance: 62.9 ml/min (by C-G formula based on Cr  of 0.56).  Liver Function Tests:  Recent Labs Lab 10/20/13 1507 10/21/13 0220  AST 12 15  ALT 9 9  ALKPHOS 101 96  BILITOT 0.6 0.5  PROT 6.4 6.3  ALBUMIN 3.0* 2.9*   No results found for this basename: LIPASE, AMYLASE,  in the last 168 hours No results found for this basename: AMMONIA,  in the last 168 hours  Urine Studies     Component Value Date/Time   COLORURINE YELLOW 08/22/2012 0810   APPEARANCEUR CLEAR 08/22/2012 0810   LABSPEC 1.017 08/22/2012 0810   PHURINE 6.5 08/22/2012 0810   GLUCOSEU NEGATIVE 08/22/2012 0810   HGBUR NEGATIVE 08/22/2012 0810   BILIRUBINUR negative 04/01/2013 0908   BILIRUBINUR NEGATIVE 08/22/2012 0810   KETONESUR NEGATIVE 08/22/2012 0810   PROTEINUR negative 04/01/2013 0908   PROTEINUR NEGATIVE 08/22/2012 0810   UROBILINOGEN 0.2 04/01/2013 0908   UROBILINOGEN 0.2 08/22/2012 0810   NITRITE negative 04/01/2013 0908   NITRITE NEGATIVE 08/22/2012 0810   LEUKOCYTESUR Negative 04/01/2013 0908    Coagulation profile No results found for this basename: INR, PROTIME,  in the last 168 hours  Cardiac Enzymes: No results found for this basename: CKTOTAL, CKMB, CKMBINDEX, TROPONINI,  in the last 168 hours BNP: No components found with this basename: POCBNP,  CBG:  Recent Labs Lab 10/21/13 0423 10/21/13 0523 10/21/13 0756  GLUCAP 67* 134* 84   D-Dimer No results found for this basename: DDIMER,  in the last 72 hours Hgb A1c No results found for this basename: HGBA1C,  in the last 72 hours Lipid Profile No results found for this basename: CHOL, HDL, LDLCALC, TRIG, CHOLHDL, LDLDIRECT,  in the last 72 hours Thyroid function studies No results found for this basename: TSH, T4TOTAL, FREET3, T3FREE, THYROIDAB,  in the last 72 hours Microbiology Recent Results (from the past 240 hour(s))  MRSA PCR SCREENING     Status: None   Collection Time    10/20/13 10:38 PM      Result Value Ref Range Status   MRSA by PCR NEGATIVE  NEGATIVE Final   Comment:            The  GeneXpert MRSA Assay (FDA     approved for NASAL specimens     only), is one component of a     comprehensive MRSA colonization     surveillance program. It is not     intended to diagnose MRSA     infection nor to guide or     monitor treatment for     MRSA infections.  Imaging Studies:  Ct Head Wo Contrast  10/20/2013   CLINICAL DATA:  Altered mental status following a seizure. No history of seizures. Completed whole brain radiation therapy on 06/16/2013 for lung cancer with brain metastases.  EXAM: CT HEAD WITHOUT CONTRAST  TECHNIQUE: Contiguous axial images were obtained from the base of the skull through the vertex without intravenous contrast.  COMPARISON:  Brain MR dated 09/09/2013 and head CT dated 05/22/2013.  FINDINGS: The previously demonstrated 2.5 x 2.5 cm low density component of the left frontal lobe mass is smaller and less rounded without the previously associated surrounding soft tissue component. The residual low density at this location measures 2.0 x 1.7 cm on image number 27. The previously seen 2.7 x 2.3 cm right parietal occipital mass with central low density has resolved with wedge shaped low density currently seen at that location involving the gray and white matter. The previously demonstrated right temporal mass has been replaced with an area of lower density. No new masses are seen and no intracranial hemorrhage or CT evidence of acute infarction. An old left thalamic lacunar infarct is unchanged. There is interval extensive confluent white matter low density in both cerebral hemispheres. Unremarkable bones. 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  IMPRESSION: 1. No acute abnormality. 2. Treated metastases, as described above. 3. Postradiation cerebral white matter changes.   Electronically Signed   By: Enrique Sack M.D.   On: 10/20/2013 20:58    Assessment/Plan: 63 y.o.  1) known  metastatic non-small cell lung cancer diagnosed in March of 2014 status post 5 cycles of systemic chemotherapy with carboplatin and Abraxane and most recently treated with whole brain irradiation for metastatic brain lesions. He has some evidence for disease progression on his recent scan.  On observation only  May need to repeat staging CTs once status improves  2. Suspected GI bleed patient was admitted with 2 day  history of diarrhea with dark stools Unfortunately patient is unconscious, therefore furhter workup which may inclide GI consult is on hold. Transfuse if Hb is less than 8 or bleeding is present as per admitting team  3. Tonic lonic Seizure event Likely secondary to metastatic disease MRI is planned once patient regains consciousness Continue Keppra as directed.  4. Recent Pneumonia   5. Full Code  Other medical issues as per admitting team     **Disclaimer: This note was dictated with voice recognition software. Similar sounding words can inadvertently be transcribed and this note may contain transcription errors which may not have been corrected upon publication of note.Sharene Butters E, PA-C 10/21/2013  9:45 AM  ADDENDUM: Hematology/Oncology Attending: The patient is seen today. I agree with the above note. His wife is at the bedside with a lot of questions. He is a very pleasant 63 years old white male with metastatic non-small cell lung cancer diagnosed in March of 2014 treated with systemic chemotherapy for 5 cycles and has been observation since August of 2014. He had metastatic brain lesions in June of 2015 and he underwent whole brain irradiation under the care of Dr. Lisbeth Renshaw. The patient was admitted yesterday to [facility] was 2 days history of diarrhea and dark stool but the stool Hemoccult was negative. During his emergency department visit the patient had an episode of tonicclonic seizure. He was treated with Neoma Laming and Ativan. Repeat CT scan of the  head showed no acute abnormality and evidence for the treated metastases. The patient was so  sleepy today and was unable to communicate. He was unable to get an MRI performed because of agitation.  I had a lengthy discussion with the wife today about her husband's condition and treatment options. I explained to her that he has poor prognosis and the survival from the time of brain metastases is usually in the range of 4-6 months. I encouraged the wife to consider palliative care and hospice if there is no improvement in his condition. He has evidence for disease progression recently on CT scan of the chest performed in early September of 2015. He is not a candidate for any aggressive chemotherapy at this point unless he has significant improvement in his condition which is less likely. Continue current treatment for seizure. Thank you so much for taking good care of Mr. J[place]lin. I will continue to follow up the patient with you and assist in his management an as-needed basis.

## 2013-10-21 NOTE — Plan of Care (Signed)
Problem: Consults Goal: General Medical Patient Education See Patient Education Module for specific education. Outcome: Progressing Due to pt's current mental status, talked with family regarding general diagnosis and plan of care. Goal: Nutrition Consult-if indicated Outcome: Not Met (add Reason) Feel currently anything PO would be an aspiration risk, will need to be reassessed.  Problem: Phase I Progression Outcomes Goal: OOB as tolerated unless otherwise ordered Outcome: Not Met (add Reason) Due to altered LOC, pt is high fall risk, and bedrest is ordered

## 2013-10-21 NOTE — Progress Notes (Addendum)
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 The patient was recently discharged from the hospital on 09/27/2013 and finished antibiotics for CAP. C. difficile PCR has been ordered. While in the emergency department, the patient had a tonic-clonic seizure that was witnessed by RN which spontaneously resolved without any medication. However, the patient was confused and agitated in his post ictal state, and the patient was given Ativan for agitation.   the patient has not had any seizure activity since that time. However, he remains encephalopathic. The patient received 1mg  Ativan x2 night of 10/20/13. Assessment/Plan: Acute encephalopathy -Likely multifactorial including prolonged postictal state, metabolic arrangement, and prolonged effects of lorazepam -wife at bedside states pt normally A&Ox3 and ambulates with walker at baseline -consult neurology -convert essential medications to IV -d/c [place] -UA and urine culture -TSH -UDS -ammonia, B12, TSH -am cortisol Tonic Clonic seizure -due to brain mets -consult neurology--spoke with Dr. Leonel Ramsay -EEG--spoke with Dr. 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 Julien Nordmann following History of Stroke -rectal ASA for now until pt is lucid COPD -stable Hypokalemia -repleted -Mg--1.5  Family Communication:   Wife updated at beside Disposition Plan:   Home when medically  stable       Procedures/Studies: Dg Chest 2 View  10/13/2013   CLINICAL DATA:  Weakness, currently being treated for pneumonia; on therapy for metastatic lung malignancy, history of COPD and previous tobacco use  EXAM: CHEST  2 VIEW  COMPARISON:  PA and lateral chest x-ray and CT scan of the chest dated September 24, 2013.  FINDINGS: The lungs are hyperinflated with hemidiaphragm flattening. There is no pleural effusion or pneumothorax. There is stable parenchymal density medially in the left upper lobe consistent with known malignancy. The heart and mediastinal structures are unremarkable. The bony thorax exhibits no acute abnormalities.  IMPRESSION: COPD without evidence of pneumonia. There is a stable parenchymal mass in the medial aspect of the left upper lobe.   Electronically Signed   By: Filmore Molyneux  [place]   On: 10/13/2013 15:01   Dg Chest 2 View  09/24/2013   CLINICAL DATA:  Shortness of breath, weakness, history of lung cancer  EXAM: CHEST  2 VIEW  COMPARISON:  CT chest dated 06/19/2013  FINDINGS: Chronic interstitial markings. Medial left upper lobe nodule, similar to prior CT chest, worrisome for metastatic disease. Mild patchy bilateral lower lobe opacities, atelectasis versus pneumonia. No pleural effusion or pneumothorax.  Heart is normal in size.  Degenerative changes of the visualized thoracolumbar spine.  IMPRESSION: Medial left upper lobe nodule, similar to prior CT chest, worrisome for metastatic disease.  Mild patchy bilateral lower lobe opacities, atelectasis versus pneumonia.   Electronically Signed   By: Julian Hy M.D.   On: 09/24/2013 21:33   Ct Head Wo Contrast  10/20/2013   CLINICAL DATA:  Altered mental status following a seizure. No history of seizures. Completed whole brain radiation therapy on 06/16/2013 for lung cancer with brain metastases.  EXAM: CT HEAD  WITHOUT CONTRAST  TECHNIQUE: Contiguous axial images were obtained from the base of the skull through the vertex  without intravenous contrast.  COMPARISON:  Brain MR dated 09/09/2013 and head CT dated 05/22/2013.  FINDINGS: The previously demonstrated 2.5 x 2.5 cm low density component of the left frontal lobe mass is smaller and less rounded without the previously associated surrounding soft tissue component. The residual low density at this location measures 2.0 x 1.7 cm on image number 27. The previously seen 2.7 x 2.3 cm right parietal occipital mass with central low density has resolved with wedge shaped low density currently seen at that location involving the gray and white matter. The previously demonstrated right temporal mass has been replaced with an area of lower density. No new masses are seen and no intracranial hemorrhage or CT evidence of acute infarction. An old left thalamic lacunar infarct is unchanged. There is interval extensive confluent white matter low density in both cerebral hemispheres. Unremarkable bones. 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  IMPRESSION: 1. No acute abnormality. 2. Treated metastases, as described above. 3. Postradiation cerebral white matter changes.   Electronically Signed   By: Enrique Sack M.D.   On: 10/20/2013 20:58   Ct Angio Chest Pe W/cm &/or Wo Cm  09/24/2013   CLINICAL DATA:  Lung cancer with brain metastases, pneumonia, evaluate for PE  EXAM: CT ANGIOGRAPHY CHEST WITH CONTRAST  TECHNIQUE: Multidetector CT imaging of the chest was performed using the standard protocol during bolus administration of intravenous contrast. Multiplanar CT image reconstructions and MIPs were obtained to evaluate the vascular anatomy.  CONTRAST:  166mL OMNIPAQUE IOHEXOL 350 MG/ML SOLN  COMPARISON:  CT chest dated 06/19/2013  FINDINGS: No evidence of pulmonary embolism.  1.4 x 1.7 cm medial left upper lobe nodule (series 12/image 34), previously 1.3 x 1.0 cm, suspicious for metastasis. Additional clustered satellite  nodularity in the left upper lobe (series 12/image 28).  Multifocal patchy opacity predominantly in the right lower lobe (series 12/image 90), and to a lesser extent in the right middle lobe (series 12/ image 69) and left lower lobe (series 12/ image 86), suspicious for multifocal pneumonia.  Underlying mild centrilobular emphysematous changes. No pleural effusion or pneumothorax.  Visualized thyroid is unremarkable.  The heart is normal in size. No pericardial effusion. Coronary atherosclerosis. Atherosclerotic calcifications of the aortic arch.  Progression of thoracic lymphadenopathy, including:  --16 mm short axis left axillary node (series 5/ image 29), previously 11 mm  --9 mm short axis subcarinal node (series 5/ image 55), previously 8 mm  --7 mm short axis right hilar node (series 5/image 56), new  Visualized upper abdomen is notable for mild nodular thickening of the left adrenal gland (series 5/image 104), indeterminate, early metastasis not excluded.  Degenerative changes of the visualized thoracolumbar spine. Exaggerated thoracic kyphosis. Cervical spine fixation hardware.  Review of the MIP images confirms the above findings.  IMPRESSION: No evidence of pulmonary embolism.  1.7 cm medial left upper lobe nodule, previously 1.3 cm, suspicious for metastasis. Progression of associated thoracic lymphadenopathy. Possible early left adrenal metastasis, indeterminate.  Multifocal pneumonia, right lower lobe predominant.   Electronically Signed   By: Julian Hy M.D.   On: 09/24/2013 23:07         Subjective: Patient is encephalopathic. He intermittently opens eyes and moans with tactile stimuli, but unable to answer any questions. No reports of respiratory distress, vomiting, diarrhea, uncontrolled pain.  Objective: Filed Vitals:  10/21/13 0800 10/21/13 0900 10/21/13 1000 10/21/13 1100  BP:    151/94  Pulse: 100 92 97 109  Temp: 97.6 F (36.4 C)     TempSrc: Oral     Resp: 23 23 22  26   Height:      Weight:      SpO2: 98% 99% 98% 96%    Intake/Output Summary (Last 24 hours) at 10/21/13 1135 Last data filed at 10/21/13 0800  Gross per 24 hour  Intake    900 ml  Output   1175 ml  Net   -275 ml   Weight change:  Exam:   General:  Pt awakens to tactile stimuli, does not follow commands appropriately, not in acute distress  HEENT: No icterus, No thrush, No meningismus, [place]/AT  Cardiovascular: RRR, S1/S2, no rubs, no gallops  Respiratory: Bilateral rales without any wheezing.  Abdomen: Soft/+BS, non tender, non distended, no guarding  Extremities: No edema, No lymphangitis, No petechiae, No rashes, no synovitis  Data Reviewed: Basic Metabolic Panel:  Recent Labs Lab 10/20/13 1507 10/20/13 2330 10/21/13 0220  NA 131*  --  133*  K 2.9*  --  5.0  CL 93*  --  98  CO2 28  --  25  GLUCOSE 84  --  84  BUN 7  --  6  CREATININE 0.60  --  0.56  CALCIUM 8.8  --  8.5  MG  --  1.5  --    Liver Function Tests:  Recent Labs Lab 10/20/13 1507 10/21/13 0220  AST 12 15  ALT 9 9  ALKPHOS 101 96  BILITOT 0.6 0.5  PROT 6.4 6.3  ALBUMIN 3.0* 2.9*   No results found for this basename: LIPASE, AMYLASE,  in the last 168 hours No results found for this basename: AMMONIA,  in the last 168 hours CBC:  Recent Labs Lab 10/20/13 1507 10/21/13 0220 10/21/13 0645  WBC 5.2 7.4 6.1  HGB 12.4* 13.8 13.9  HCT 37.1* 40.4 42.7  MCV 89.4 89.0 90.1  PLT 256 268 244   Cardiac Enzymes: No results found for this basename: CKTOTAL, CKMB, CKMBINDEX, TROPONINI,  in the last 168 hours BNP: No components found with this basename: POCBNP,  CBG:  Recent Labs Lab 10/21/13 0423 10/21/13 0523 10/21/13 0756  GLUCAP 67* 134* 84    Recent Results (from the past 240 hour(s))  MRSA PCR SCREENING     Status: None   Collection Time    10/20/13 10:38 PM      Result Value Ref Range Status   MRSA by PCR NEGATIVE  NEGATIVE Final   Comment:            The GeneXpert MRSA  Assay (FDA     approved for NASAL specimens     only), is one component of a     comprehensive MRSA colonization     surveillance program. It is not     intended to diagnose MRSA     infection nor to guide or     monitor treatment for     MRSA infections.     Scheduled Meds: . antiseptic oral rinse  7 mL Mouth Rinse q12n4p  . aspirin EC  81 mg Oral QHS  . atorvastatin  20 mg Oral q1800  . chlorhexidine  15 mL Mouth Rinse BID  . dextrose      . enoxaparin (LOVENOX) injection  40 mg Subcutaneous QHS  . gabapentin  600 mg Oral Q breakfast  .  levETIRAcetam  500 mg Intravenous BID  . nortriptyline  75 mg Oral QHS  . tamsulosin  0.4 mg Oral Daily   Continuous Infusions: . 0.9 % NaCl with KCl 20 mEq / L 75 mL/hr (10/21/13 0025)     Amarionna Arca, DO  [facility] Pager [phone]  If 7PM-7AM, please contact night-coverage [email or web address] Password [email or web address] 10/21/2013, 11:35 AM   LOS: 1 day

## 2013-10-21 NOTE — Consult Note (Signed)
Reason for Consult:seizure and AMS Referring Physician: Dr Tat   CC: seizure and AMS  HPI: [name] is an 63 y.o. 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 Neurology consulted for episode of tonic-clonic seizure activity in the ED which self resolved without any medication. Per ED notes it lasted around 35seconds. Since this event he has had a prolonged post-ictal period without returning to his baseline mental status. He was confused and agitated during this time period and was given ativan x 2 doses. He was loaded with 1gram of Keppra and started on Keppra 500mg  BID after this event.   This morning, resting with sitter and wife present in the room. Wife reports no verbal output besides for occasional moans. Not following commands.   Last MRI of the brain done on 09/09/2013 showed marked improvement in metastatic deposits. Had head CT in the ED which was unremarkable for any acute process.   Past Medical History  Diagnosis Date  . BENIGN PROSTATIC HYPERTROPHY 12/24/2009  . CEREBROVASCULAR ACCIDENT, HX OF 12/24/2009  . HYPERLIPIDEMIA 12/24/2009  . HYPERTENSION 12/24/2009  . Testosterone deficiency   . ED (erectile dysfunction)   . Claudication in peripheral vascular disease, lifestyle limiting, with abnormal arterial dopplers of lower ext. 06/30/2011  . S/P angioplasty with stent to Lt. SFA 06/30/11 06/30/2011  . COPD 12/24/2009  . Shortness of breath     "from the COPD" (10/13/2011)  . Stroke 1996    "right side not as strong as left since; numbness R leg/foot" (10/13/2011)  . Pernicious anemia   . Complication of anesthesia     hard to wake up  . Pneumonia   . 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 Brain metastases 05/22/13    ct head   . History of radiation therapy 06/02/13-06/16/13    brain    Past Surgical History  Procedure Laterality Date  . Cervical fusion  1980's    "total of 3-4 OR's on my neck; pinched nerve"  (10/13/2011)  . Aneurysm coiling  2007  . Peripheral arterial stent graft  06/30/2010; 10/13/10    left; left  . Video bronchoscopy Bilateral 04/03/2012    Procedure: VIDEO BRONCHOSCOPY WITHOUT FLUORO;  Surgeon: Tanda Rockers, MD;  Location: [facility];  Service: Cardiopulmonary;  Laterality: Bilateral;  . Amputation Left 08/26/2012    Procedure: IRRIGATION AND DEBRIDMENT LEFT DORSAL ABSCESS/ 4TH AND 5TH LEFT RAY AMPUTATION ;  Surgeon: Wylene Simmer, MD;  Location: [facility];  Service: Orthopedics;  Laterality: Left;  . Femoral-popliteal bypass graft Left 09/03/2012    Procedure: BYPASS GRAFT FEMORAL-POPLITEAL ARTERY;  Surgeon: Serafina Mitchell, MD;  Location: [place];  Service: Vascular;  Laterality: Left;  . Cardiac catheterization    . Colonoscopy      hx: of  . I&d extremity Left 09/26/2012    Procedure: IRRIGATION AND DEBRIDEMENT EXTREMITY- LEFT FOOT WITH WOUND VAC PLACEMENT;  Surgeon: Serafina Mitchell, MD;  Location: [facility];  Service: Vascular;  Laterality: Left;    Family History  Problem Relation Age of Onset  . Emphysema Father     smoked  . Emphysema Paternal Uncle     smoked  . Heart disease Mother   . Lung cancer Father     smoked  . Lung cancer Paternal Grandfather     smoked    Social History:  reports that he has been smoking Cigarettes.  He has  a 33.75 pack-year smoking history. He has never used smokeless tobacco. He reports that he does not drink alcohol or use illicit drugs.  No Known Allergies  Medications:  Scheduled: . antiseptic oral rinse  7 mL Mouth Rinse q12n4p  . aspirin EC  81 mg Oral QHS  . atorvastatin  20 mg Oral q1800  . chlorhexidine  15 mL Mouth Rinse BID  . dextrose      . enoxaparin (LOVENOX) injection  40 mg Subcutaneous QHS  . gabapentin  600 mg Oral Q breakfast  . levETIRAcetam  500 mg Intravenous BID  . nortriptyline  75 mg Oral QHS  . tamsulosin  0.4 mg Oral Daily    .Patient non-verbal, unable to complete ROS  Neurologic  Examination Mental Status: Eyes closed, not following simple commands, moans to noxious stimuli Cranial Nerves: II: patient forcibly closes eyes to attempted opening. Eyes appear midline, non-roving III,IV, VI: eyes closed V,VII: no facial asymmetry noted VIII: hearing normal bilaterally IX,X:cough present  Motor: Unable to do formal testing due to mental status. He appears to move all extremities symmetrically and against resistance Sensory: responds to noxious stimulus symmetrically in all 4 extremities Deep Tendon Reflexes: 2+ and symmetric throughout Plantars: Right: downgoing   Left: upgoing Cerebellar: Unable to test due to mental status Gait: unable to test due to mental status  Laboratory Studies:   Basic Metabolic Panel:  Recent Labs Lab 10/20/13 1507 10/20/13 2330 10/21/13 0220 10/21/13 1240  NA 131*  --  133* 133*  K 2.9*  --  5.0 5.6*  CL 93*  --  98 99  CO2 28  --  25 23  GLUCOSE 84  --  84 78  BUN 7  --  6 5*  CREATININE 0.60  --  0.56 0.56  CALCIUM 8.8  --  8.5 8.6  MG  --  1.5  --   --     Liver Function Tests:  Recent Labs Lab 10/20/13 1507 10/21/13 0220  AST 12 15  ALT 9 9  ALKPHOS 101 96  BILITOT 0.6 0.5  PROT 6.4 6.3  ALBUMIN 3.0* 2.9*   No results found for this basename: LIPASE, AMYLASE,  in the last 168 hours No results found for this basename: AMMONIA,  in the last 168 hours  CBC:  Recent Labs Lab 10/20/13 1507 10/21/13 0220 10/21/13 0645  WBC 5.2 7.4 6.1  HGB 12.4* 13.8 13.9  HCT 37.1* 40.4 42.7  MCV 89.4 89.0 90.1  PLT 256 268 244    Cardiac Enzymes: No results found for this basename: CKTOTAL, CKMB, CKMBINDEX, TROPONINI,  in the last 168 hours  BNP: No components found with this basename: POCBNP,   CBG:  Recent Labs Lab 10/21/13 0423 10/21/13 0523 10/21/13 0756  GLUCAP 67* 134* 84    Microbiology: Results for orders placed during the hospital encounter of 10/20/13  MRSA PCR SCREENING     Status: None    Collection Time    10/20/13 10:38 PM      Result Value Ref Range Status   MRSA by PCR NEGATIVE  NEGATIVE Final   Comment:            The GeneXpert MRSA Assay (FDA     approved for NASAL specimens     only), is one component of a     comprehensive MRSA colonization     surveillance program. It is not     intended to diagnose MRSA     infection  nor to guide or     monitor treatment for     MRSA infections.    Coagulation Studies: No results found for this basename: LABPROT, INR,  in the last 72 hours  Urinalysis: No results found for this basename: COLORURINE, APPERANCEUR, LABSPEC, PHURINE, GLUCOSEU, HGBUR, BILIRUBINUR, KETONESUR, PROTEINUR, UROBILINOGEN, NITRITE, LEUKOCYTESUR,  in the last 168 hours  Lipid Panel:  No results found for this basename: chol, trig, hdl, cholhdl, vldl, ldlcalc    HgbA1C:  No results found for this basename: HGBA1C    Urine Drug Screen:   No results found for this basename: labopia, cocainscrnur, labbenz, amphetmu, thcu, labbarb    Alcohol Level: No results found for this basename: ETH,  in the last 168 hours  Other results:  Imaging: Ct Head Wo Contrast  10/20/2013   CLINICAL DATA:  Altered mental status following a seizure. No history of seizures. Completed whole brain radiation therapy on 06/16/2013 for lung cancer with brain metastases.  EXAM: CT HEAD WITHOUT CONTRAST  TECHNIQUE: Contiguous axial images were obtained from the base of the skull through the vertex without intravenous contrast.  COMPARISON:  Brain MR dated 09/09/2013 and head CT dated 05/22/2013.  FINDINGS: The previously demonstrated 2.5 x 2.5 cm low density component of the left frontal lobe mass is smaller and less rounded without the previously associated surrounding soft tissue component. The residual low density at this location measures 2.0 x 1.7 cm on image number 27. The previously seen 2.7 x 2.3 cm right parietal occipital mass with central low density has resolved with  wedge shaped low density currently seen at that location involving the gray and white matter. The previously demonstrated right temporal mass has been replaced with an area of lower density. No new masses are seen and no intracranial hemorrhage or CT evidence of acute infarction. An old left thalamic lacunar infarct is unchanged. There is interval extensive confluent white matter low density in both cerebral hemispheres. Unremarkable bones. 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  IMPRESSION: 1. No acute abnormality. 2. Treated metastases, as described above. 3. Postradiation cerebral white matter changes.   Electronically Signed   By: Enrique Sack M.D.   On: 10/20/2013 20:58     Assessment/Plan:  1)Seizure 2)Encephalopathy 3)Metatstatic SCC with brain mets  63y/o gentleman with known brain mets from Stage IV SCC with new onset seizure and continued AMS. Suspect encephalopathy is likely multifactorial. Will check EEG to rule out non-convulsive status. Continue keppra 500mg  BID pending EEG read. Would hold on repeat brain MRI pending EEG. Try to limit use of sedating medications such as ativan for agitation. Will follow up once EEG completed.   Jim Like, DO [facility] [phone]  If 7pm- 7am, please page neurology on call as listed in [place]. 10/21/2013, 1:10 PM

## 2013-10-21 NOTE — Telephone Encounter (Signed)
emmi emailed °

## 2013-10-21 NOTE — Progress Notes (Signed)
INITIAL NUTRITION ASSESSMENT  Pt meets criteria for severe MALNUTRITION in the context of chronic illness as evidenced by <50% estimated energy intake in the past month with 15% weight loss in the past 3 months.  DOCUMENTATION CODES Per approved criteria  -Severe malnutrition in the context of chronic illness -Underweight   INTERVENTION: - Ensure Complete HS - Recommend, once pt starting to eat well again, monitoring of potassium, phosphorus, and magnesium as pt at risk of refeeding syndrome r/t wife report of minimal intake x 1 month - RD to continue to monitor   NUTRITION DIAGNOSIS: Inadequate oral intake related to poor appetite/lethargy as evidenced by wife/sitter report.   Goal: Pt to consume >90% of meals/supplements  Monitor:  Weights, labs, intake  Reason for Assessment: Malnutrition screening tool   63 y.o. male  Admitting Dx: Seizure  ASSESSMENT: Pt with history of metastatic non-small cell lung cancer who was on chemotherapy previously and currently under observation and has had received brain radiation and was recently admitted for pneumonia and was on antibiotics was brought to the ER by patient's family as patient has been having diarrhea for last 2 days with dark stools. Patient did not have any nausea vomiting or abdominal pain. While waiting in the ER patient had brief episode of tonic-clonic seizures lasted for around 35 seconds. Was witnessed by the patient's nurse. Patient's seizure stopped spontaneously before any medication was given. CT head did not show anything acute per MD.   - Sitter in room, pt asleep, wife at bedside - She reports pt has been eating only bites of food for the past month with 19 pound unintended weight loss in the past 3 months - Pt not on any nutritional supplements at home - Wife said pt c/o some nausea however no vomiting - She said pt has not eaten anything today due to being asleep  Potassium elevated   Height: Ht Readings  from Last 1 Encounters:  10/20/13 5\' 7"  (1.702 m)    Weight: Wt Readings from Last 1 Encounters:  10/20/13 105 lb 2.6 oz (47.7 kg)    Ideal Body Weight: 148 lbs   % Ideal Body Weight: 71%  Wt Readings from Last 10 Encounters:  10/20/13 105 lb 2.6 oz (47.7 kg)  10/13/13 110 lb (49.896 kg)  10/09/13 110 lb (49.896 kg)  10/03/13 110 lb (49.896 kg)  09/25/13 113 lb 15.7 oz (51.7 kg)  09/10/13 115 lb 12.8 oz (52.527 kg)  07/22/13 124 lb 8 oz (56.473 kg)  07/10/13 124 lb 9.6 oz (56.518 kg)  07/03/13 130 lb 8 oz (59.194 kg)  06/30/13 129 lb (58.514 kg)    Usual Body Weight: 124 lbs 3 months ago  % Usual Body Weight: 85%  BMI:  Body mass index is 16.47 kg/(m^2). Underweight  Estimated Nutritional Needs: Kcal: 1700-1900 Protein: 70-90g Fluid: 1.7-1.9L/day   Skin: intact   Diet Order: Cardiac  EDUCATION NEEDS: -No education needs identified at this time   Intake/Output Summary (Last 24 hours) at 10/21/13 1644 Last data filed at 10/21/13 1200  Gross per 24 hour  Intake   1300 ml  Output   1175 ml  Net    125 ml    Last BM: 10/5  Labs:   Recent Labs Lab 10/20/13 1507 10/20/13 2330 10/21/13 0220 10/21/13 1240  NA 131*  --  133* 133*  K 2.9*  --  5.0 5.6*  CL 93*  --  98 99  CO2 28  --  25 23  BUN 7  --  6 5*  CREATININE 0.60  --  0.56 0.56  CALCIUM 8.8  --  8.5 8.6  MG  --  1.5  --   --   GLUCOSE 84  --  84 78    CBG (last 3)   Recent Labs  10/21/13 0423 10/21/13 0523 10/21/13 0756  GLUCAP 67* 134* 84    Scheduled Meds: . antiseptic oral rinse  7 mL Mouth Rinse q12n4p  . aspirin  300 mg Rectal Once  . chlorhexidine  15 mL Mouth Rinse BID  . dextrose      . enoxaparin (LOVENOX) injection  40 mg Subcutaneous QHS  . levETIRAcetam  500 mg Intravenous BID    Continuous Infusions: . dextrose 5 % and 0.9% NaCl 1,000 mL infusion      Past Medical History  Diagnosis Date  . BENIGN PROSTATIC HYPERTROPHY 12/24/2009  . CEREBROVASCULAR  ACCIDENT, HX OF 12/24/2009  . HYPERLIPIDEMIA 12/24/2009  . HYPERTENSION 12/24/2009  . Testosterone deficiency   . ED (erectile dysfunction)   . Claudication in peripheral vascular disease, lifestyle limiting, with abnormal arterial dopplers of lower ext. 06/30/2011  . S/P angioplasty with stent to Lt. SFA 06/30/11 06/30/2011  . COPD 12/24/2009  . Shortness of breath     "from the COPD" (10/13/2011)  . Stroke 1996    "right side not as strong as left since; numbness R leg/foot" (10/13/2011)  . Pernicious anemia   . Complication of anesthesia     hard to wake up  . Pneumonia   . 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 Brain metastases 05/22/13    ct head   . History of radiation therapy 06/02/13-06/16/13    brain    Past Surgical History  Procedure Laterality Date  . Cervical fusion  1980's    "total of 3-4 OR's on my neck; pinched nerve" (10/13/2011)  . Aneurysm coiling  2007  . Peripheral arterial stent graft  06/30/2010; 10/13/10    left; left  . Video bronchoscopy Bilateral 04/03/2012    Procedure: VIDEO BRONCHOSCOPY WITHOUT FLUORO;  Surgeon: Tanda Rockers, MD;  Location: [facility];  Service: Cardiopulmonary;  Laterality: Bilateral;  . Amputation Left 08/26/2012    Procedure: IRRIGATION AND DEBRIDMENT LEFT DORSAL ABSCESS/ 4TH AND 5TH LEFT RAY AMPUTATION ;  Surgeon: Wylene Simmer, MD;  Location: [facility];  Service: Orthopedics;  Laterality: Left;  . Femoral-popliteal bypass graft Left 09/03/2012    Procedure: BYPASS GRAFT FEMORAL-POPLITEAL ARTERY;  Surgeon: Serafina Mitchell, MD;  Location: [facility];  Service: Vascular;  Laterality: Left;  . Cardiac catheterization    . Colonoscopy      hx: of  . I&d extremity Left 09/26/2012    Procedure: IRRIGATION AND DEBRIDEMENT EXTREMITY- LEFT FOOT WITH WOUND VAC PLACEMENT;  Surgeon: Serafina Mitchell, MD;  Location: [facility];  Service: Vascular;  Laterality: Left;    Carlis Stable MS, [place], [place] Pager [phone] Weekend/After Hours  Pager

## 2013-10-21 NOTE — Procedures (Signed)
History: 63 year old no brain metastasis presenting with seizures and continued altered mental status  Sedation: Multiple doses of benzodiazepines  Technique: This is a 17 channel routine scalp EEG performed at the bedside with bipolar and monopolar montages arranged in accordance to the international 10/20 system of electrode placement. One channel was dedicated to EKG recording.    Background: The background consists of alpha and beta activities with intermixed irregular delta activity. There is a posterior dominant rhythm that is seen with a frequency of 8.5 Hz. Marland Kitchen  Photic stimulation: Physiologic driving is not performed  EEG Abnormalities: 1) Generalized irregular delta activity  Clinical Interpretation: This EEG is consistent with a generalized non-specific cerebral dysfunction(encephalopathy). There was no seizure or seizure predisposition recorded on this study.   Roland Rack, MD [facility] [phone]  If 7pm- 7am, please page neurology on call as listed in [place].

## 2013-10-21 NOTE — Care Management Note (Signed)
    Page 1 of 1   10/21/2013     1:44:21 PM CARE MANAGEMENT NOTE 10/21/2013  Patient:  [name], [name]   Account Number:  [id number]  Date Initiated:  10/21/2013  Documentation initiated by:  DAVIS,RHONDA  Subjective/Objective Assessment:   hx of ir to the brain recent hx of c.diff/now presenting with seizures and poss recurrent c.diff     Action/Plan:   home when stable   Anticipated DC Date:  10/24/2013   Anticipated DC Plan:  HOME/SELF CARE         Choice offered to / List presented to:             Status of service:  In process, will continue to follow Medicare Important Message given?   (If response is "NO", the following Medicare IM given date fields will be blank) Date Medicare IM given:   Medicare IM given by:   Date Additional Medicare IM given:   Additional Medicare IM given by:    Discharge Disposition:    Per UR Regulation:  Reviewed for med. necessity/level of care/duration of stay  If discussed at [place] of Stay Meetings, dates discussed:    Comments:  [id number]/Rhonda Rosana Hoes, RN, BSN, CCM Chart reviewed. Discharge needs and patient's stay to be reviewed and followed by case manager.

## 2013-10-22 ENCOUNTER — Inpatient Hospital Stay (HOSPITAL_COMMUNITY)
Admit: 2013-10-22 | Discharge: 2013-10-22 | Disposition: A | Discharge status: Home / Self Care | Payer: Medicare Other | Attending: Internal Medicine | Admitting: Internal Medicine

## 2013-10-22 ENCOUNTER — Inpatient Hospital Stay (HOSPITAL_COMMUNITY): Payer: Medicare Other

## 2013-10-22 DIAGNOSIS — J449 Chronic obstructive pulmonary disease, unspecified: Secondary | ICD-10-CM

## 2013-10-22 DIAGNOSIS — IMO0001 Reserved for inherently not codable concepts without codable children: Secondary | ICD-10-CM | POA: Diagnosis present

## 2013-10-22 LAB — BASIC METABOLIC PANEL
Anion gap: 12 (ref 5–15)
BUN: 7 mg/dL (ref 6–23)
CALCIUM: 8.3 mg/dL — AB (ref 8.4–10.5)
CO2: 23 mEq/L (ref 19–32)
CREATININE: 0.55 mg/dL (ref 0.50–1.35)
Chloride: 98 mEq/L (ref 96–112)
Glucose, Bld: 135 mg/dL — ABNORMAL HIGH (ref 70–99)
Potassium: 4 mEq/L (ref 3.7–5.3)
Sodium: 133 mEq/L — ABNORMAL LOW (ref 137–147)

## 2013-10-22 LAB — URINE CULTURE

## 2013-10-22 LAB — PRO B NATRIURETIC PEPTIDE: PRO B NATRI PEPTIDE: 9249 pg/mL — AB (ref 0–125)

## 2013-10-22 LAB — GLUCOSE, CAPILLARY: GLUCOSE-CAPILLARY: 106 mg/dL — AB (ref 70–99)

## 2013-10-22 MED ORDER — SODIUM CHLORIDE 0.9 % IV BOLUS (SEPSIS)
500.0000 mL | Freq: Once | INTRAVENOUS | Status: AC
  Administered 2013-10-22: 500 mL via INTRAVENOUS

## 2013-10-22 MED ORDER — FUROSEMIDE 10 MG/ML IJ SOLN
20.0000 mg | Freq: Two times a day (BID) | INTRAMUSCULAR | Status: AC
Start: 2013-10-22 — End: 2013-10-23
  Administered 2013-10-22 – 2013-10-23 (×2): 20 mg via INTRAVENOUS
  Filled 2013-10-22 (×2): qty 2

## 2013-10-22 NOTE — Progress Notes (Signed)
Pt more alert, ate whole cup of strawberry ice cream when fed. No dysphagia noted. Still becomes very agitated when care given such as repositioning, bathing, mouth care.  Pt will moan, push away but calms when left alone. Will monitor.

## 2013-10-22 NOTE — Progress Notes (Signed)
Pt escorted to MRI by this Probation officer on 6lnc. Pt slid to table for MRI but unable to lay on back and stay still. Attempting to sit up and bend legs, unable to perform per MRI techs. Pt returned to room and Dr Darrick Meigs paged to make aware.

## 2013-10-22 NOTE — Progress Notes (Signed)
A few hours after the 500cc bolus, patient still had only voided 75cc. NP notified and new orders given to insert a foley catheter.

## 2013-10-22 NOTE — Progress Notes (Signed)
Subjective: Resting comfortably. No overnight events. Patient more alert this morning  History: [name] is an 63 y.o. 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 Neurology consulted for episode of tonic-clonic seizure activity in the ED which self resolved without any medication. Per ED notes it lasted around 35seconds. Since this event he has had a prolonged post-ictal period without returning to his baseline mental status. He was confused and agitated during this time period and was given ativan x 2 doses. He was loaded with 1gram of Keppra and started on Keppra 500mg  BID after this event.  This morning, resting with sitter and wife present in the room. Wife reports no verbal output besides for occasional moans. Not following commands.  Last MRI of the brain done on 09/09/2013 showed marked improvement in metastatic deposits. Had head CT in the ED which was unremarkable for any acute process.   EEG 10/06: Clinical Interpretation: This EEG is consistent with a generalized non-specific cerebral dysfunction(encephalopathy). There was no seizure or seizure predisposition recorded on this study.   Objective: Current vital signs: BP 120/93  Pulse 100  Temp(Src) 97.6 F (36.4 C) (Oral)  Resp 17  Ht 5\' 7"  (1.702 m)  Wt 47.7 kg (105 lb 2.6 oz)  BMI 16.47 kg/m2  SpO2 94% Vital signs in last 24 hours: Temp:  [97.6 F (36.4 C)-98.1 F (36.7 C)] 97.6 F (36.4 C) (10/07 0400) Pulse Rate:  [98-115] 100 (10/07 0800) Resp:  [16-27] 17 (10/07 0800) BP: (118-155)/(74-97) 120/93 mmHg (10/07 0800) SpO2:  [94 %-100 %] 94 % (10/07 0947) Weight:  [47.7 kg (105 lb 2.6 oz)] 47.7 kg (105 lb 2.6 oz) (10/07 0058)  Intake/Output from previous day: 10/06 0701 - 10/07 0700 In: 1610 [I.V.:1410; IV Piggyback:200] Out: 2549 [Urine:1511] Intake/Output this shift:   Nutritional status: Cardiac  Neurologic Exam: Neurologic Examination  Mental Status:  Eyes open,  oriented to name and "hospital", not oriented to date. Follows simple 1 step commands.  Cranial Nerves:  II: perrl III,IV, VI: would not track but appears to move eyes symmetrically through horizontal fields V,VII: no facial asymmetry noted  VIII: hearing normal bilaterally  IX,X:cough present  Motor:  Unable to do formal testing due to mental status. He appears to move all extremities symmetrically and against resistance  Sensory: responds to noxious stimulus symmetrically in all 4 extremities  Deep Tendon Reflexes: 2+ and symmetric throughout  Plantars:  Right: downgoing Left: upgoing  Cerebellar:  Unable to test due to mental status  Gait: unable to test due to mental status   Lab Results: Basic Metabolic Panel:  Recent Labs Lab 10/20/13 1507 10/20/13 2330 10/21/13 0220 10/21/13 1240 10/22/13 0325  NA 131*  --  133* 133* 133*  K 2.9*  --  5.0 5.6* 4.0  CL 93*  --  98 99 98  CO2 28  --  25 23 23   GLUCOSE 84  --  84 78 135*  BUN 7  --  6 5* 7  CREATININE 0.60  --  0.56 0.56 0.55  CALCIUM 8.8  --  8.5 8.6 8.3*  MG  --  1.5  --   --   --     Liver Function Tests:  Recent Labs Lab 10/20/13 1507 10/21/13 0220  AST 12 15  ALT 9 9  ALKPHOS 101 96  BILITOT 0.6 0.5  PROT 6.4 6.3  ALBUMIN 3.0* 2.9*   No results found for this  basename: LIPASE, AMYLASE,  in the last 168 hours  Recent Labs Lab 10/21/13 1318  AMMONIA 59    CBC:  Recent Labs Lab 10/20/13 1507 10/21/13 0220 10/21/13 0645  WBC 5.2 7.4 6.1  HGB 12.4* 13.8 13.9  HCT 37.1* 40.4 42.7  MCV 89.4 89.0 90.1  PLT 256 268 244    Cardiac Enzymes: No results found for this basename: CKTOTAL, CKMB, CKMBINDEX, TROPONINI,  in the last 168 hours  Lipid Panel: No results found for this basename: CHOL, TRIG, HDL, CHOLHDL, VLDL, LDLCALC,  in the last 168 hours  CBG:  Recent Labs Lab 10/21/13 0523 10/21/13 0756 10/21/13 1241 10/21/13 1635 10/21/13 [place] 134* 84 86 88 106*     Microbiology: Results for orders placed during the hospital encounter of 10/20/13  MRSA PCR SCREENING     Status: None   Collection Time    10/20/13 10:38 PM      Result Value Ref Range Status   MRSA by PCR NEGATIVE  NEGATIVE Final   Comment:            The GeneXpert MRSA Assay (FDA     approved for NASAL specimens     only), is one component of a     comprehensive MRSA colonization     surveillance program. It is not     intended to diagnose MRSA     infection nor to guide or     monitor treatment for     MRSA infections.    Coagulation Studies: No results found for this basename: LABPROT, INR,  in the last 72 hours  Imaging: Ct Head Wo Contrast  10/20/2013   CLINICAL DATA:  Altered mental status following a seizure. No history of seizures. Completed whole brain radiation therapy on 06/16/2013 for lung cancer with brain metastases.  EXAM: CT HEAD WITHOUT CONTRAST  TECHNIQUE: Contiguous axial images were obtained from the base of the skull through the vertex without intravenous contrast.  COMPARISON:  Brain MR dated 09/09/2013 and head CT dated 05/22/2013.  FINDINGS: The previously demonstrated 2.5 x 2.5 cm low density component of the left frontal lobe mass is smaller and less rounded without the previously associated surrounding soft tissue component. The residual low density at this location measures 2.0 x 1.7 cm on image number 27. The previously seen 2.7 x 2.3 cm right parietal occipital mass with central low density has resolved with wedge shaped low density currently seen at that location involving the gray and white matter. The previously demonstrated right temporal mass has been replaced with an area of lower density. No new masses are seen and no intracranial hemorrhage or CT evidence of acute infarction. An old left thalamic lacunar infarct is unchanged. There is interval extensive confluent white matter low density in both cerebral hemispheres. Unremarkable bones.  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  IMPRESSION: 1. No acute abnormality. 2. Treated metastases, as described above. 3. Postradiation cerebral white matter changes.   Electronically Signed   By: Enrique Sack M.D.   On: 10/20/2013 20:58   Dg Chest Port 1 View  10/21/2013   CLINICAL DATA:  63 year old male with known history of lung cancer presenting with cough and confusion.  EXAM: PORTABLE CHEST - 1 VIEW  COMPARISON:  Chest x-ray 10/13/2013.  FINDINGS: Left upper lobe pulmonary nodule again noted, estimated to measure approximately 2.9 x 1.7 cm. No consolidative airspace disease. No pleural effusions. Mild diffuse interstitial prominence and  peribronchial cuffing. No evidence of pulmonary edema. Heart size is normal. Upper mediastinal contours are slightly distorted by patient's rotation to the left, but appear within normal limits. Atherosclerosis in the thoracic aorta.  IMPRESSION: 1. Mild diffuse peribronchial cuffing and interstitial prominence may suggest bronchitis. 2. Left upper lobe pulmonary nodule appears similar to recent prior examinations. 3. Atherosclerosis.   Electronically Signed   By: Vinnie Langton M.D.   On: 10/21/2013 19:03    Medications:  Scheduled: . antiseptic oral rinse  7 mL Mouth Rinse q12n4p  . chlorhexidine  15 mL Mouth Rinse BID  . enoxaparin (LOVENOX) injection  40 mg Subcutaneous QHS  . feeding supplement (ENSURE COMPLETE)  237 mL Oral QHS  . ipratropium-albuterol  3 mL Nebulization Q6H  . levETIRAcetam  500 mg Intravenous BID    Assessment/Plan: 1)Seizure  2)Encephalopathy  3)Metatstatic SCC with brain mets   63y/o gentleman with known brain mets from Stage IV SCC with new onset seizure and continued AMS. Suspect encephalopathy is likely multifactorial. EEG shows generalized slowing with no seizure activity noted. Mental status appears to be slowly improving at this time. Would  continue to monitor. Continue Keppra 500mg  BID. Can plan for MRI brain but would not sedate in order to get exam. Neurology will continue to follow.    LOS: 2 days   Jim Like, DO [facility] [phone]  If 7pm- 7am, please page neurology on call as listed in [place]. 10/22/2013  10:06 AM

## 2013-10-22 NOTE — Progress Notes (Signed)
This morning, patient started speaking to the staff. He stated his name, it still is hard to understand him, but he is trying to speak with [place].

## 2013-10-22 NOTE — Progress Notes (Signed)
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 The patient was recently discharged from the hospital on 09/27/2013 and finished antibiotics for CAP. C. difficile PCR has been ordered. While in the emergency department, the patient had a tonic-clonic seizure that was witnessed by RN which spontaneously resolved without any medication. However, the patient was confused and agitated in his post ictal state, and the patient was given Ativan for agitation.   the patient has not had any seizure activity since that time. However, he remains encephalopathic. The patient received 1mg  Ativan x2 night of 10/20/13.  Assessment/Plan:  Acute encephalopathy/delirium Patient continues to be agitated, likely multifactorial from the onset seizure, neurology has seen the patient and started on Keppra. You is negative urine culture is pending. Urine for Legionella antigen as well as strep pneumo urinary antigen are negative. TSH is 0.566, B12 584. Pamelor has been discontinued. Continue Ativan 1 mg IV every 4 hours when necessary. MRI brain pending. 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 EEG was done by neurology which showed generalized nonspecific cerebral dysfunction no seizure or syncopal  predisposition recorded in the study. Continued Keppra.  Diarrhea Cdiff PCR is pending. 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 Julien Nordmann following  History of Stroke Rectal ASA for now until  pt is lucid  COPD Continue DuoNeb nebulizers every 6 hours     Family Communication:   No family at bedside Disposition Plan:   Home when medically stable       Procedures/Studies: Dg Chest 2 View  10/13/2013   CLINICAL DATA:  Weakness, currently being treated for pneumonia; on therapy for metastatic lung malignancy, history of COPD and previous tobacco use  EXAM: CHEST  2 VIEW  COMPARISON:  PA and lateral chest x-ray and CT scan of the chest dated September 24, 2013.  FINDINGS: The lungs are hyperinflated with hemidiaphragm flattening. There is no pleural effusion or pneumothorax. There is stable parenchymal density medially in the left upper lobe consistent with known malignancy. The heart and mediastinal structures are unremarkable. The bony thorax exhibits no acute abnormalities.  IMPRESSION: COPD without evidence of pneumonia. There is a stable parenchymal mass in the medial aspect of the left upper lobe.   Electronically Signed   By: David  Martinique   On: 10/13/2013 15:01   Dg Chest 2 View  09/24/2013   CLINICAL DATA:  Shortness of breath, weakness, history of lung cancer  EXAM: CHEST  2 VIEW  COMPARISON:  CT chest dated 06/19/2013  FINDINGS: Chronic interstitial markings. Medial left upper lobe nodule, similar to prior CT chest, worrisome for metastatic disease. Mild patchy bilateral lower lobe opacities, atelectasis versus pneumonia. No pleural effusion or pneumothorax.  Heart is normal in size.  Degenerative changes of the visualized thoracolumbar spine.  IMPRESSION: Medial left upper lobe nodule, similar to prior CT chest, worrisome for metastatic disease.  Mild patchy bilateral lower lobe opacities, atelectasis versus pneumonia.   Electronically Signed   By: Henderson Newcomer.D.  On: 09/24/2013 21:33   Ct Head Wo Contrast  10/20/2013   CLINICAL DATA:  Altered mental status following a seizure. No history of seizures. Completed whole brain radiation therapy on 06/16/2013 for lung  cancer with brain metastases.  EXAM: CT HEAD WITHOUT CONTRAST  TECHNIQUE: Contiguous axial images were obtained from the base of the skull through the vertex without intravenous contrast.  COMPARISON:  Brain MR dated 09/09/2013 and head CT dated 05/22/2013.  FINDINGS: The previously demonstrated 2.5 x 2.5 cm low density component of the left frontal lobe mass is smaller and less rounded without the previously associated surrounding soft tissue component. The residual low density at this location measures 2.0 x 1.7 cm on image number 27. The previously seen 2.7 x 2.3 cm right parietal occipital mass with central low density has resolved with wedge shaped low density currently seen at that location involving the gray and white matter. The previously demonstrated right temporal mass has been replaced with an area of lower density. No new masses are seen and no intracranial hemorrhage or CT evidence of acute infarction. An old left thalamic lacunar infarct is unchanged. There is interval extensive confluent white matter low density in both cerebral hemispheres. Unremarkable bones. 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  IMPRESSION: 1. No acute abnormality. 2. Treated metastases, as described above. 3. Postradiation cerebral white matter changes.   Electronically Signed   By: Enrique Sack M.D.   On: 10/20/2013 20:58   Ct Angio Chest Pe W/cm &/or Wo Cm  09/24/2013   CLINICAL DATA:  Lung cancer with brain metastases, pneumonia, evaluate for PE  EXAM: CT ANGIOGRAPHY CHEST WITH CONTRAST  TECHNIQUE: Multidetector CT imaging of the chest was performed using the standard protocol during bolus administration of intravenous contrast. Multiplanar CT image reconstructions and MIPs were obtained to evaluate the vascular anatomy.  CONTRAST:  182mL OMNIPAQUE IOHEXOL 350 MG/ML SOLN  COMPARISON:  CT chest dated 06/19/2013  FINDINGS: No evidence of pulmonary  embolism.  1.4 x 1.7 cm medial left upper lobe nodule (series 12/image 34), previously 1.3 x 1.0 cm, suspicious for metastasis. Additional clustered satellite nodularity in the left upper lobe (series 12/image 28).  Multifocal patchy opacity predominantly in the right lower lobe (series 12/image 90), and to a lesser extent in the right middle lobe (series 12/ image 69) and left lower lobe (series 12/ image 86), suspicious for multifocal pneumonia.  Underlying mild centrilobular emphysematous changes. No pleural effusion or pneumothorax.  Visualized thyroid is unremarkable.  The heart is normal in size. No pericardial effusion. Coronary atherosclerosis. Atherosclerotic calcifications of the aortic arch.  Progression of thoracic lymphadenopathy, including:  --16 mm short axis left axillary node (series 5/ image 29), previously 11 mm  --9 mm short axis subcarinal node (series 5/ image 55), previously 8 mm  --7 mm short axis right hilar node (series 5/image 56), new  Visualized upper abdomen is notable for mild nodular thickening of the left adrenal gland (series 5/image 104), indeterminate, early metastasis not excluded.  Degenerative changes of the visualized thoracolumbar spine. Exaggerated thoracic kyphosis. Cervical spine fixation hardware.  Review of the MIP images confirms the above findings.  IMPRESSION: No evidence of pulmonary embolism.  1.7 cm medial left upper lobe nodule, previously 1.3 cm, suspicious for metastasis. Progression of associated thoracic lymphadenopathy. Possible early left adrenal metastasis, indeterminate.  Multifocal pneumonia, right lower lobe predominant.   Electronically Signed   By: Julian Hy M.D.   On:  09/24/2013 23:07         Subjective: Patient seen, very agitated, trying to get out of bed.  Objective: Filed Vitals:   10/22/13 0304 10/22/13 0400 10/22/13 0800 10/22/13 0947  BP:  126/79 120/93   Pulse:  98 100   Temp:  97.6 F (36.4 C) 98.1 F (36.7 C)     TempSrc:  Oral Oral   Resp:   17   Height:      Weight:      SpO2: 97% 100% 100% 94%    Intake/Output Summary (Last 24 hours) at 10/22/13 1159 Last data filed at 10/22/13 1055  Gross per 24 hour  Intake   1460 ml  Output   1111 ml  Net    349 ml   Weight change: 0 kg (0 lb) Exam:  Physical Exam: Head: Normocephalic, atraumatic.  Neck: supple,No deformities, masses, or tenderness noted. Lungs: Normal respiratory effort. Bilateral rhonchi Heart: Regular RR. S1 and S2 normal  Abdomen: BS normoactive. Soft, Nondistended, non-tender.  Extremities: No pretibial edema, no erythema   Data Reviewed: Basic Metabolic Panel:  Recent Labs Lab 10/20/13 1507 10/20/13 2330 10/21/13 0220 10/21/13 1240 10/22/13 0325  NA 131*  --  133* 133* 133*  K 2.9*  --  5.0 5.6* 4.0  CL 93*  --  98 99 98  CO2 28  --  25 23 23   GLUCOSE 84  --  84 78 135*  BUN 7  --  6 5* 7  CREATININE 0.60  --  0.56 0.56 0.55  CALCIUM 8.8  --  8.5 8.6 8.3*  MG  --  1.5  --   --   --    Liver Function Tests:  Recent Labs Lab 10/20/13 1507 10/21/13 0220  AST 12 15  ALT 9 9  ALKPHOS 101 96  BILITOT 0.6 0.5  PROT 6.4 6.3  ALBUMIN 3.0* 2.9*   No results found for this basename: LIPASE, AMYLASE,  in the last 168 hours  Recent Labs Lab 10/21/13 1318  AMMONIA 59   CBC:  Recent Labs Lab 10/20/13 1507 10/21/13 0220 10/21/13 0645  WBC 5.2 7.4 6.1  HGB 12.4* 13.8 13.9  HCT 37.1* 40.4 42.7  MCV 89.4 89.0 90.1  PLT 256 268 244   Cardiac Enzymes: No results found for this basename: CKTOTAL, CKMB, CKMBINDEX, TROPONINI,  in the last 168 hours BNP: No components found with this basename: POCBNP,  CBG:  Recent Labs Lab 10/21/13 0523 10/21/13 0756 10/21/13 1241 10/21/13 1635 10/21/13 1953  GLUCAP 134* 84 86 88 106*    Recent Results (from the past 240 hour(s))  MRSA PCR SCREENING     Status: None   Collection Time    10/20/13 10:38 PM      Result Value Ref Range Status   MRSA by PCR  NEGATIVE  NEGATIVE Final   Comment:            The GeneXpert MRSA Assay (FDA     approved for NASAL specimens     only), is one component of a     comprehensive MRSA colonization     surveillance program. It is not     intended to diagnose MRSA     infection nor to guide or     monitor treatment for     MRSA infections.     Scheduled Meds: . antiseptic oral rinse  7 mL Mouth Rinse q12n4p  . chlorhexidine  15 mL Mouth Rinse BID  .  enoxaparin (LOVENOX) injection  40 mg Subcutaneous QHS  . feeding supplement (ENSURE COMPLETE)  237 mL Oral QHS  . ipratropium-albuterol  3 mL Nebulization Q6H  . levETIRAcetam  500 mg Intravenous BID   Continuous Infusions: . dextrose 5 % and 0.9% NaCl 1,000 mL infusion 75 mL/hr at 10/22/13 1042     [facility] S, MD [facility] Pager [phone]  If 7PM-7AM, please contact night-coverage [email or web address] Password [email or web address] 10/22/2013, 11:59 AM   LOS: 2 days

## 2013-10-22 NOTE — Progress Notes (Signed)
Patient had only voided 36cc in 8 hrs. Bladder scan done and showed 200cc in the bladder. NP was notified and new orders were given for a 500cc bolus. Will continue to monitor patient.

## 2013-10-22 NOTE — Progress Notes (Signed)
Medication scanned and Pt's arm band scanned but Pt refused treatment when i tried to put the mask on his face.  I made repeatedly tried to put the mask on his face but he kept swatting the mask away and saying no.

## 2013-10-23 ENCOUNTER — Other Ambulatory Visit: Payer: Self-pay | Admitting: Neurology

## 2013-10-23 DIAGNOSIS — C719 Malignant neoplasm of brain, unspecified: Secondary | ICD-10-CM

## 2013-10-23 LAB — BASIC METABOLIC PANEL
Anion gap: 10 (ref 5–15)
BUN: 4 mg/dL — ABNORMAL LOW (ref 6–23)
CALCIUM: 8.2 mg/dL — AB (ref 8.4–10.5)
CO2: 27 mEq/L (ref 19–32)
Chloride: 97 mEq/L (ref 96–112)
Creatinine, Ser: 0.49 mg/dL — ABNORMAL LOW (ref 0.50–1.35)
Glucose, Bld: 123 mg/dL — ABNORMAL HIGH (ref 70–99)
Potassium: 3 mEq/L — ABNORMAL LOW (ref 3.7–5.3)
SODIUM: 134 meq/L — AB (ref 137–147)

## 2013-10-23 LAB — GLUCOSE, CAPILLARY
GLUCOSE-CAPILLARY: 108 mg/dL — AB (ref 70–99)
GLUCOSE-CAPILLARY: 113 mg/dL — AB (ref 70–99)
GLUCOSE-CAPILLARY: 125 mg/dL — AB (ref 70–99)
GLUCOSE-CAPILLARY: 127 mg/dL — AB (ref 70–99)
GLUCOSE-CAPILLARY: 137 mg/dL — AB (ref 70–99)
GLUCOSE-CAPILLARY: 97 mg/dL (ref 70–99)
Glucose-Capillary: 103 mg/dL — ABNORMAL HIGH (ref 70–99)
Glucose-Capillary: 118 mg/dL — ABNORMAL HIGH (ref 70–99)
Glucose-Capillary: 130 mg/dL — ABNORMAL HIGH (ref 70–99)
Glucose-Capillary: 91 mg/dL (ref 70–99)
Glucose-Capillary: 94 mg/dL (ref 70–99)

## 2013-10-23 MED ORDER — LORAZEPAM 2 MG/ML IJ SOLN
1.0000 mg | Freq: Once | INTRAMUSCULAR | Status: AC
  Administered 2013-10-24: 1 mg via INTRAVENOUS

## 2013-10-23 MED ORDER — FUROSEMIDE 10 MG/ML IJ SOLN
20.0000 mg | Freq: Two times a day (BID) | INTRAMUSCULAR | Status: DC
  Administered 2013-10-23 – 2013-10-24 (×3): 20 mg via INTRAVENOUS
  Filled 2013-10-23 (×3): qty 2

## 2013-10-23 MED ORDER — POTASSIUM CHLORIDE 10 MEQ/100ML IV SOLN
10.0000 meq | INTRAVENOUS | Status: AC
  Administered 2013-10-23 (×3): 10 meq via INTRAVENOUS
  Filled 2013-10-23 (×2): qty 100

## 2013-10-23 MED ORDER — IPRATROPIUM-ALBUTEROL 0.5-2.5 (3) MG/3ML IN SOLN
3.0000 mL | Freq: Three times a day (TID) | RESPIRATORY_TRACT | Status: DC
  Administered 2013-10-23 – 2013-10-25 (×4): 3 mL via RESPIRATORY_TRACT
  Filled 2013-10-23 (×4): qty 3

## 2013-10-23 NOTE — Progress Notes (Signed)
Subjective: Resting comfortably. Unable to tolerate MRI on 10/07 due to agitation so exam aborted. Resting comfortably, had just received IV ativan x 1 dose. Wife in the room. Sitter reports patient up frequently over night.   History: [name] is an 63 y.o. 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 Neurology consulted for episode of tonic-clonic seizure activity in the ED which self resolved without any medication. Per ED notes it lasted around 35seconds. Since this event he has had a prolonged post-ictal period without returning to his baseline mental status. He was confused and agitated during this time period and was given ativan x 2 doses. He was loaded with 1gram of Keppra and started on Keppra 500mg  BID after this event.  This morning, resting with sitter and wife present in the room. Wife reports no verbal output besides for occasional moans. Not following commands.  Last MRI of the brain done on 09/09/2013 showed marked improvement in metastatic deposits. Had head CT in the ED which was unremarkable for any acute process.   EEG 10/06: Clinical Interpretation: This EEG is consistent with a generalized non-specific cerebral dysfunction(encephalopathy). There was no seizure or seizure predisposition recorded on this study.   Objective: Current vital signs: BP 138/76  Pulse 104  Temp(Src) 97.2 F (36.2 C) (Axillary)  Resp 18  Ht 5\' 7"  (1.702 m)  Wt 47.3 kg (104 lb 4.4 oz)  BMI 16.33 kg/m2  SpO2 93% Vital signs in last 24 hours: Temp:  [97.2 F (36.2 C)-98.5 F (36.9 C)] 97.2 F (36.2 C) (10/08 0800) Pulse Rate:  [51-123] 104 (10/08 1000) Resp:  [13-22] 18 (10/08 1000) BP: (105-140)/(69-95) 138/76 mmHg (10/08 1000) SpO2:  [92 %-100 %] 93 % (10/08 1000) Weight:  [47.3 kg (104 lb 4.4 oz)] 47.3 kg (104 lb 4.4 oz) (10/08 0418)  Intake/Output from previous day: 10/07 0701 - 10/08 0700 In: 1951.3 [P.O.:120; I.V.:1631.3; IV Piggyback:200] Out:  4709 [Urine:3525] Intake/Output this shift: Total I/O In: 75 [I.V.:75] Out: 375 [Urine:375] Nutritional status: Cardiac  Neurologic Exam: Neurologic Examination  Mental Status:  Eyes closed, lethargic. Briefly opens eyes and then falls back asleep. Moans but no verbal output. Not following commands.  Cranial Nerves:  II: perrl III,IV, VI: forcibly closes eyes, would not track. Eyes appear midline.  V,VII: no facial asymmetry noted  VIII: hearing normal bilaterally  IX,X:cough present  Motor:  Unable to do formal testing due to mental status. He appears to move all extremities symmetrically and against resistance  Sensory: responds to noxious stimulus symmetrically in all 4 extremities  Deep Tendon Reflexes: 2+ and symmetric throughout  Plantars:  Right: downgoing Left: upgoing  Cerebellar:  Unable to test due to mental status  Gait: unable to test due to mental status   Lab Results: Basic Metabolic Panel:  Recent Labs Lab 10/20/13 1507 10/20/13 2330 10/21/13 0220 10/21/13 1240 10/22/13 0325 10/23/13 0332  NA 131*  --  133* 133* 133* 134*  K 2.9*  --  5.0 5.6* 4.0 3.0*  CL 93*  --  98 99 98 97  CO2 28  --  25 23 23 27   GLUCOSE 84  --  84 78 135* 123*  BUN 7  --  6 5* 7 4*  CREATININE 0.60  --  0.56 0.56 0.55 0.49*  CALCIUM 8.8  --  8.5 8.6 8.3* 8.2*  MG  --  1.5  --   --   --   --  Liver Function Tests:  Recent Labs Lab 10/20/13 1507 10/21/13 0220  AST 12 15  ALT 9 9  ALKPHOS 101 96  BILITOT 0.6 0.5  PROT 6.4 6.3  ALBUMIN 3.0* 2.9*   No results found for this basename: LIPASE, AMYLASE,  in the last 168 hours  Recent Labs Lab 10/21/13 1318  AMMONIA 59    CBC:  Recent Labs Lab 10/20/13 1507 10/21/13 0220 10/21/13 0645  WBC 5.2 7.4 6.1  HGB 12.4* 13.8 13.9  HCT 37.1* 40.4 42.7  MCV 89.4 89.0 90.1  PLT 256 268 244    Cardiac Enzymes: No results found for this basename: CKTOTAL, CKMB, CKMBINDEX, TROPONINI,  in the last 168  hours  Lipid Panel: No results found for this basename: CHOL, TRIG, HDL, CHOLHDL, VLDL, LDLCALC,  in the last 168 hours  CBG:  Recent Labs Lab 10/22/13 0756 10/22/13 1958 10/23/13 0029 10/23/13 0433 10/23/13 0825  GLUCAP 130* 113* 125* 103* 37    Microbiology: Results for orders placed during the hospital encounter of 10/20/13  MRSA PCR SCREENING     Status: None   Collection Time    10/20/13 10:38 PM      Result Value Ref Range Status   MRSA by PCR NEGATIVE  NEGATIVE Final   Comment:            The GeneXpert MRSA Assay (FDA     approved for NASAL specimens     only), is one component of a     comprehensive MRSA colonization     surveillance program. It is not     intended to diagnose MRSA     infection nor to guide or     monitor treatment for     MRSA infections.  URINE CULTURE     Status: None   Collection Time    10/21/13  4:30 PM      Result Value Ref Range Status   Specimen Description URINE, CLEAN CATCH   Final   Special Requests NONE   Final   Culture  Setup Time     Final   Value: 10/22/2013 02:07     Performed at [place]     Final   Value: 30,000 COLONIES/ML     Performed at [facility]   Culture     Final   Value: Multiple bacterial morphotypes present, none predominant. Suggest appropriate recollection if clinically indicated.     Performed at [facility]   Report Status 10/22/2013 FINAL   Final    Coagulation Studies: No results found for this basename: LABPROT, INR,  in the last 72 hours  Imaging: Dg Chest Port 1 View  10/21/2013   CLINICAL DATA:  63 year old male with known history of lung cancer presenting with cough and confusion.  EXAM: PORTABLE CHEST - 1 VIEW  COMPARISON:  Chest x-ray 10/13/2013.  FINDINGS: Left upper lobe pulmonary nodule again noted, estimated to measure approximately 2.9 x 1.7 cm. No consolidative airspace disease. No pleural effusions. Mild diffuse interstitial prominence and  peribronchial cuffing. No evidence of pulmonary edema. Heart size is normal. Upper mediastinal contours are slightly distorted by patient's rotation to the left, but appear within normal limits. Atherosclerosis in the thoracic aorta.  IMPRESSION: 1. Mild diffuse peribronchial cuffing and interstitial prominence may suggest bronchitis. 2. Left upper lobe pulmonary nodule appears similar to recent prior examinations. 3. Atherosclerosis.   Electronically Signed   By: Mauri Brooklyn.D.  On: 10/21/2013 19:03   Mr Attempted Daymon Larsen Report  10/22/2013   This examination belongs to an outside facility and is stored  here for comparison purposes only.  Contact the originating outside  institution for any associated report or interpretation.   Medications:  Scheduled: . antiseptic oral rinse  7 mL Mouth Rinse q12n4p  . chlorhexidine  15 mL Mouth Rinse BID  . enoxaparin (LOVENOX) injection  40 mg Subcutaneous QHS  . feeding supplement (ENSURE COMPLETE)  237 mL Oral QHS  . furosemide  20 mg Intravenous Q12H  . ipratropium-albuterol  3 mL Nebulization Q6H  . levETIRAcetam  500 mg Intravenous BID  . potassium chloride  10 mEq Intravenous Q1 Hr x 3    Assessment/Plan: 1)Seizure  2)Encephalopathy  3)Metatstatic SCC with brain mets   63y/o gentleman with known brain mets from Stage IV SCC with new onset seizure and continued AMS. Suspect encephalopathy is likely multifactorial. EEG shows generalized slowing with no seizure activity noted. Exam limited at this time due to recent dose of ativan. Continue keppra 500mg  BID. If ativan is going to be given would suggest giving a dose of ativan prior to MRI. If no improvement in mental status and MRI stable would consider prolonged EEG to check for seizure activity. Will hold off on this for now pending re-attempt to get MRI brain.       LOS: 3 days   Jim Like, DO [facility] [phone]  If 7pm- 7am, please page neurology on call as  listed in [place]. 10/23/2013  10:13 AM

## 2013-10-23 NOTE — Progress Notes (Signed)
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 The patient was recently discharged from the hospital on 09/27/2013 and finished antibiotics for CAP. C. difficile PCR has been ordered. While in the emergency department, the patient had a tonic-clonic seizure that was witnessed by RN which spontaneously resolved without any medication. However, the patient was confused and agitated in his post ictal state, and the patient was given Ativan for agitation.   the patient has not had any seizure activity since that time. However, he remains encephalopathic. The patient received 1mg  Ativan x2 night of 10/20/13.  Assessment/Plan:  Acute encephalopathy/delirium Patient continues to be agitated, likely multifactorial from the onset seizure, neurology has seen the patient and started on Keppra. You is negative urine culture is pending. Urine for Legionella antigen as well as strep pneumo urinary antigen are negative. TSH is 0.566, B12 584. Pamelor has been discontinued. Continue Ativan 1 mg IV every 4 hours when necessary. MRI brain pending. Neurology following   Acute diastolic CHF Improving with IV Lasix Patient has elevated BNP 9249.0, Started on lasix 20 mg IV q 12hr. patient had good diuretic response, we'll continue with Lasix 20 mg IV every 12 hours monitor) output.  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 EEG was done by neurology which showed generalized nonspecific cerebral dysfunction no seizure or syncopal  predisposition recorded in the study. Continued Keppra.  Diarrhea Cdiff PCR is pending. 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 Julien Nordmann following  History of Stroke Rectal ASA for now until pt is lucid  Hypokalemia Replace potassium Check BMP in a.m.  COPD Continue DuoNeb nebulizers every 6 hours   Family Communication:   No family at bedside Disposition Plan:   Home when medically stable    Procedures/Studies: Dg Chest 2 View  10/13/2013   CLINICAL DATA:  Weakness, currently being treated for pneumonia; on therapy for metastatic lung malignancy, history of COPD and previous tobacco use  EXAM: CHEST  2 VIEW  COMPARISON:  PA and lateral chest x-ray and CT scan of the chest dated September 24, 2013.  FINDINGS: The lungs are hyperinflated with hemidiaphragm flattening. There is no pleural effusion or pneumothorax. There is stable parenchymal density medially in the left upper lobe consistent with known malignancy. The heart and mediastinal structures are unremarkable. The bony thorax exhibits no acute abnormalities.  IMPRESSION: COPD without evidence of pneumonia. There is a stable parenchymal mass in the medial aspect of the left upper lobe.   Electronically Signed   By: David  Martinique   On: 10/13/2013 15:01   Dg Chest 2 View  09/24/2013   CLINICAL DATA:  Shortness of breath, weakness, history of lung cancer  EXAM: CHEST  2 VIEW  COMPARISON:  CT chest dated 06/19/2013  FINDINGS: Chronic interstitial markings. Medial left upper lobe nodule, similar to prior CT chest, worrisome for metastatic disease. Mild patchy bilateral lower lobe opacities, atelectasis versus pneumonia. No pleural effusion or pneumothorax.  Heart is normal in size.  Degenerative changes of the  visualized thoracolumbar spine.  IMPRESSION: Medial left upper lobe nodule, similar to prior CT chest, worrisome for metastatic disease.  Mild patchy bilateral lower lobe opacities, atelectasis versus pneumonia.    Electronically Signed   By: Julian Hy M.D.   On: 09/24/2013 21:33   Ct Head Wo Contrast  10/20/2013   CLINICAL DATA:  Altered mental status following a seizure. No history of seizures. Completed whole brain radiation therapy on 06/16/2013 for lung cancer with brain metastases.  EXAM: CT HEAD WITHOUT CONTRAST  TECHNIQUE: Contiguous axial images were obtained from the base of the skull through the vertex without intravenous contrast.  COMPARISON:  Brain MR dated 09/09/2013 and head CT dated 05/22/2013.  FINDINGS: The previously demonstrated 2.5 x 2.5 cm low density component of the left frontal lobe mass is smaller and less rounded without the previously associated surrounding soft tissue component. The residual low density at this location measures 2.0 x 1.7 cm on image number 27. The previously seen 2.7 x 2.3 cm right parietal occipital mass with central low density has resolved with wedge shaped low density currently seen at that location involving the gray and white matter. The previously demonstrated right temporal mass has been replaced with an area of lower density. No new masses are seen and no intracranial hemorrhage or CT evidence of acute infarction. An old left thalamic lacunar infarct is unchanged. There is interval extensive confluent white matter low density in both cerebral hemispheres. Unremarkable bones. 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  IMPRESSION: 1. No acute abnormality. 2. Treated metastases, as described above. 3. Postradiation cerebral white matter changes.   Electronically Signed   By: Enrique Sack M.D.   On: 10/20/2013 20:58   Ct Angio Chest Pe W/cm &/or Wo Cm  09/24/2013   CLINICAL DATA:  Lung cancer with brain metastases, pneumonia, evaluate for PE  EXAM: CT ANGIOGRAPHY CHEST WITH CONTRAST  TECHNIQUE: Multidetector CT imaging of the chest was performed using the standard protocol during bolus  administration of intravenous contrast. Multiplanar CT image reconstructions and MIPs were obtained to evaluate the vascular anatomy.  CONTRAST:  173mL OMNIPAQUE IOHEXOL 350 MG/ML SOLN  COMPARISON:  CT chest dated 06/19/2013  FINDINGS: No evidence of pulmonary embolism.  1.4 x 1.7 cm medial left upper lobe nodule (series 12/image 34), previously 1.3 x 1.0 cm, suspicious for metastasis. Additional clustered satellite nodularity in the left upper lobe (series 12/image 28).  Multifocal patchy opacity predominantly in the right lower lobe (series 12/image 90), and to a lesser extent in the right middle lobe (series 12/ image 69) and left lower lobe (series 12/ image 86), suspicious for multifocal pneumonia.  Underlying mild centrilobular emphysematous changes. No pleural effusion or pneumothorax.  Visualized thyroid is unremarkable.  The heart is normal in size. No pericardial effusion. Coronary atherosclerosis. Atherosclerotic calcifications of the aortic arch.  Progression of thoracic lymphadenopathy, including:  --16 mm short axis left axillary node (series 5/ image 29), previously 11 mm  --9 mm short axis subcarinal node (series 5/ image 55), previously 8 mm  --7 mm short axis right hilar node (series 5/image 56), new  Visualized upper abdomen is notable for mild nodular thickening of the left adrenal gland (series 5/image 104), indeterminate, early metastasis not excluded.  Degenerative changes of the visualized thoracolumbar spine. Exaggerated thoracic kyphosis. Cervical spine fixation hardware.  Review of the MIP images confirms the above findings.  IMPRESSION: No evidence of pulmonary embolism.  1.7 cm medial  left upper lobe nodule, previously 1.3 cm, suspicious for metastasis. Progression of associated thoracic lymphadenopathy. Possible early left adrenal metastasis, indeterminate.  Multifocal pneumonia, right lower lobe predominant.   Electronically Signed   By: Julian Hy M.D.   On: 09/24/2013 23:07           Subjective: Patient seen, very agitated, trying to get out of bed.  Objective: Filed Vitals:   10/23/13 1202 10/23/13 1356 10/23/13 1400 10/23/13 1600  BP:   136/87 108/83  Pulse:  99 110 103  Temp: 97.2 F (36.2 C)     TempSrc: Axillary     Resp:  18 17 22   Height:      Weight:      SpO2:  96% 100% 97%    Intake/Output Summary (Last 24 hours) at 10/23/13 1649 Last data filed at 10/23/13 1500  Gross per 24 hour  Intake 1921.25 ml  Output   4225 ml  Net -2303.75 ml   Weight change: -0.4 kg (-14.1 oz) Exam:  Physical Exam: Head: Normocephalic, atraumatic.  Neck: supple,No deformities, masses, or tenderness noted. Lungs: Normal respiratory effort. Bilateral rhonchi Heart: Regular RR. S1 and S2 normal  Abdomen: BS normoactive. Soft, Nondistended, non-tender.  Extremities: No pretibial edema, no erythema   Data Reviewed: Basic Metabolic Panel:  Recent Labs Lab 10/20/13 1507 10/20/13 2330 10/21/13 0220 10/21/13 1240 10/22/13 0325 10/23/13 0332  NA 131*  --  133* 133* 133* 134*  K 2.9*  --  5.0 5.6* 4.0 3.0*  CL 93*  --  98 99 98 97  CO2 28  --  25 23 23 27   GLUCOSE 84  --  84 78 135* 123*  BUN 7  --  6 5* 7 4*  CREATININE 0.60  --  0.56 0.56 0.55 0.49*  CALCIUM 8.8  --  8.5 8.6 8.3* 8.2*  MG  --  1.5  --   --   --   --    Liver Function Tests:  Recent Labs Lab 10/20/13 1507 10/21/13 0220  AST 12 15  ALT 9 9  ALKPHOS 101 96  BILITOT 0.6 0.5  PROT 6.4 6.3  ALBUMIN 3.0* 2.9*   No results found for this basename: LIPASE, AMYLASE,  in the last 168 hours  Recent Labs Lab 10/21/13 1318  AMMONIA 59   CBC:  Recent Labs Lab 10/20/13 1507 10/21/13 0220 10/21/13 0645  WBC 5.2 7.4 6.1  HGB 12.4* 13.8 13.9  HCT 37.1* 40.4 42.7  MCV 89.4 89.0 90.1  PLT 256 268 244   Cardiac Enzymes: No results found for this basename: CKTOTAL, CKMB, CKMBINDEX, TROPONINI,  in the last 168 hours BNP: No components found with this basename: POCBNP,   CBG:  Recent Labs Lab 10/23/13 0029 10/23/13 0433 10/23/13 0825 10/23/13 1235 10/23/13 1624  GLUCAP 125* 103* 94 97 91    Recent Results (from the past 240 hour(s))  MRSA PCR SCREENING     Status: None   Collection Time    10/20/13 10:38 PM      Result Value Ref Range Status   MRSA by PCR NEGATIVE  NEGATIVE Final   Comment:            The GeneXpert MRSA Assay (FDA     approved for NASAL specimens     only), is one component of a     comprehensive MRSA colonization     surveillance program. It is not     intended to diagnose MRSA  infection nor to guide or     monitor treatment for     MRSA infections.  URINE CULTURE     Status: None   Collection Time    10/21/13  4:30 PM      Result Value Ref Range Status   Specimen Description URINE, CLEAN CATCH   Final   Special Requests NONE   Final   Culture  Setup Time     Final   Value: 10/22/2013 02:07     Performed at [facility]     Final   Value: 30,000 COLONIES/ML     Performed at [facility]   Culture     Final   Value: Multiple bacterial morphotypes present, none predominant. Suggest appropriate recollection if clinically indicated.     Performed at [facility]   Report Status 10/22/2013 FINAL   Final     Scheduled Meds: . antiseptic oral rinse  7 mL Mouth Rinse q12n4p  . chlorhexidine  15 mL Mouth Rinse BID  . enoxaparin (LOVENOX) injection  40 mg Subcutaneous QHS  . feeding supplement (ENSURE COMPLETE)  237 mL Oral QHS  . furosemide  20 mg Intravenous Q12H  . ipratropium-albuterol  3 mL Nebulization TID  . levETIRAcetam  500 mg Intravenous BID   Continuous Infusions: . dextrose 5 % and 0.9% NaCl 1,000 mL infusion 75 mL/hr at 10/23/13 0803     [facility] S, MD [facility] Pager [phone]  If 7PM-7AM, please contact night-coverage [email or web address] Password [email or web address] 10/23/2013, 4:49 PM   LOS: 3 days

## 2013-10-23 NOTE — Progress Notes (Addendum)
Report from Erich Montane, RN.  Pt sleeping in bed w/ sitter bedside. Bed alarm on. VSS. Agree with AM assessment. Will monitor.

## 2013-10-23 NOTE — Progress Notes (Signed)
Patient tachycardiac in 130-140's and agitated. PRN ativan already given and cannot give another 4 hours. Text-paged floor coverage to make aware.

## 2013-10-23 NOTE — Progress Notes (Signed)
Patient's potassium 3.0. Text-paged NP Baltazar Najjar to make aware.

## 2013-10-24 ENCOUNTER — Inpatient Hospital Stay (HOSPITAL_COMMUNITY): Payer: Medicare Other

## 2013-10-24 DIAGNOSIS — I10 Essential (primary) hypertension: Secondary | ICD-10-CM

## 2013-10-24 LAB — BASIC METABOLIC PANEL
ANION GAP: 12 (ref 5–15)
BUN: 4 mg/dL — ABNORMAL LOW (ref 6–23)
CO2: 27 meq/L (ref 19–32)
Calcium: 8.5 mg/dL (ref 8.4–10.5)
Chloride: 96 mEq/L (ref 96–112)
Creatinine, Ser: 0.52 mg/dL (ref 0.50–1.35)
GFR calc Af Amer: >90 mL/min (ref >90)
Glucose, Bld: 122 mg/dL — ABNORMAL HIGH (ref 70–99)
Potassium: 3.1 mEq/L — ABNORMAL LOW (ref 3.7–5.3)
SODIUM: 135 meq/L — AB (ref 137–147)

## 2013-10-24 LAB — GLUCOSE, CAPILLARY
GLUCOSE-CAPILLARY: 130 mg/dL — AB (ref 70–99)
GLUCOSE-CAPILLARY: 105 mg/dL — AB (ref 70–99)
Glucose-Capillary: 106 mg/dL — ABNORMAL HIGH (ref 70–99)
Glucose-Capillary: 107 mg/dL — ABNORMAL HIGH (ref 70–99)

## 2013-10-24 MED ORDER — POTASSIUM CHLORIDE 10 MEQ/100ML IV SOLN
10.0000 meq | INTRAVENOUS | Status: AC
  Administered 2013-10-24 (×2): 10 meq via INTRAVENOUS
  Filled 2013-10-24 (×2): qty 100

## 2013-10-24 MED ORDER — POTASSIUM CHLORIDE 10 MEQ/100ML IV SOLN
INTRAVENOUS | Status: AC
  Administered 2013-10-24: 10 meq
  Filled 2013-10-24: qty 100

## 2013-10-24 MED ORDER — METOPROLOL TARTRATE 1 MG/ML IV SOLN
2.5000 mg | Freq: Four times a day (QID) | INTRAVENOUS | Status: DC
  Administered 2013-10-24 – 2013-10-26 (×9): 2.5 mg via INTRAVENOUS
  Filled 2013-10-24 (×9): qty 5

## 2013-10-24 NOTE — Evaluation (Signed)
Clinical/Bedside Swallow Evaluation Patient Details  Name: [name] MRN: [id number] Date of Birth: 02/27/1950  Today's Date: 10/24/2013 Time: 3295-1884 SLP Time Calculation (min): 69 min  Past Medical History:  Past Medical History  Diagnosis Date  . BENIGN PROSTATIC HYPERTROPHY 12/24/2009  . CEREBROVASCULAR ACCIDENT, HX OF 12/24/2009  . HYPERLIPIDEMIA 12/24/2009  . HYPERTENSION 12/24/2009  . Testosterone deficiency   . ED (erectile dysfunction)   . Claudication in peripheral vascular disease, lifestyle limiting, with abnormal arterial dopplers of lower ext. 06/30/2011  . S/P angioplasty with stent to Lt. SFA 06/30/11 06/30/2011  . COPD 12/24/2009  . Shortness of breath     "from the COPD" (10/13/2011)  . Stroke 1996    "right side not as strong as left since; numbness R leg/foot" (10/13/2011)  . Pernicious anemia   . Complication of anesthesia     hard to wake up  . Pneumonia   . 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 Brain metastases 05/22/13    ct head   . History of radiation therapy 06/02/13-06/16/13    brain   Past Surgical History:  Past Surgical History  Procedure Laterality Date  . Cervical fusion  1980's    "total of 3-4 OR's on my neck; pinched nerve" (10/13/2011)  . Aneurysm coiling  2007  . Peripheral arterial stent graft  06/30/2010; 10/13/10    left; left  . Video bronchoscopy Bilateral 04/03/2012    Procedure: VIDEO BRONCHOSCOPY WITHOUT FLUORO;  Surgeon: Tanda Rockers, MD;  Location: [facility];  Service: Cardiopulmonary;  Laterality: Bilateral;  . Amputation Left 08/26/2012    Procedure: IRRIGATION AND DEBRIDMENT LEFT DORSAL ABSCESS/ 4TH AND 5TH LEFT RAY AMPUTATION ;  Surgeon: Wylene Simmer, MD;  Location: [facility];  Service: Orthopedics;  Laterality: Left;  . Femoral-popliteal bypass graft Left 09/03/2012    Procedure: BYPASS GRAFT FEMORAL-POPLITEAL ARTERY;  Surgeon: Serafina Mitchell, MD;  Location: [facility];  Service: Vascular;  Laterality: Left;  .  Cardiac catheterization    . Colonoscopy      hx: of  . 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 The patient was recently discharged from the hospital on 09/27/2013 and finished antibiotics for CAP. C. difficile PCR has been ordered. While in the emergency department, the patient had a tonic-clonic seizure that was witnessed by RN which spontaneously resolved without any medication. However, the patient was confused and agitated in his post ictal state, and the patient was given Ativan for agitation.   the patient has not had any seizure activity since that time. However, he remains encephalopathic. The patient received 1mg  Ativan x2 night of 10/20/13.   Assessment / Plan / Recommendation Clinical Impression  Patient is currently lehtargic and unable to remain alert for more than a few seconds at a time.  When he is awake, he does not appear to be interested in po's, closing lips tightly when spoon is presented,  and turning head away.  Pt did take 1 piece of ice, chewed and swallowed without overt s/s of dysphagia, with timely swallow  initiation, good laryngeal elevation palpated, and no cough or throat clearing noted.  Pt is not currently phonating.  Any verbal attempt was whispered.  Pt took one small sip of water via spoon, and again swallowed without overt difficulty.  He took one small (1/4 tsp) bite of ice cream, with sucking pattern noted during the oral phase, and no cough after swallow.  Wife, at b/s reports pt ate only bites at home.  He has lost 15% of his body weight  in last 3 months, per RD.  Currently, pt is too lethargic to eat safely, and it is doubtful that he will meet his nutritional needs with po's alone, even if awake.  Currently he is a full code.  ? if family has considered Palliative Care.  If this is not a desirable option, recommend consideration of alternate means of nutrition.  Pt should remain NPO until alert and able to participate.  SLP will return Monday re: POC.    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 The patient was recently discharged from the hospital on 09/27/2013 and finished antibiotics for CAP. C. difficile PCR has been ordered. While in the emergency department, the patient had a tonic-clonic seizure that was witnessed by RN which spontaneously resolved without any medication. However, the patient was confused and agitated in his post ictal state, and the patient was given Ativan for agitation.   the patient has not had any seizure activity since that time. However, he remains encephalopathic. The patient received 1mg  Ativan x2 night of 10/20/13. Type of Study: Bedside swallow evaluation Previous Swallow Assessment: none Diet Prior to this Study: NPO Temperature Spikes Noted: No Respiratory Status: Nasal cannula History of Recent Intubation: No Behavior/Cognition: Lethargic;Decreased sustained attention;Doesn't follow directions;Impulsive Oral  Cavity - Dentition: Missing dentition;Poor condition Self-Feeding Abilities: Refused PO Patient Positioning: Upright in bed Baseline Vocal Quality: Low vocal intensity;Breathy Volitional Cough: Cognitively unable to elicit Volitional Swallow: Able to elicit    Oral/Motor/Sensory Function Overall Oral Motor/Sensory Function: Appears within functional limits for tasks assessed   Ice Chips Ice chips: Within functional limits Presentation: Spoon   Thin Liquid Thin Liquid: Within functional limits Presentation: Spoon    Nectar Thick Nectar Thick Liquid: Not tested   Honey Thick Honey Thick Liquid: Not tested   Puree Puree: Within functional limits Presentation: Spoon   Solid   GO    Solid: Not tested       Quinn Axe T 10/24/2013,4:24 PM

## 2013-10-24 NOTE — Progress Notes (Addendum)
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 The patient was recently discharged from the hospital on 09/27/2013 and finished antibiotics for CAP. C. difficile PCR has been ordered. While in the emergency department, the patient had a tonic-clonic seizure that was witnessed by RN which spontaneously resolved without any medication. However, the patient was confused and agitated in his post ictal state, and the patient was given Ativan for agitation.   the patient has not had any seizure activity since that time. However, he remains encephalopathic. The patient received 1mg  Ativan x2 night of 10/20/13.  Assessment/Plan:  Acute encephalopathy/delirium Patient continues to be agitated, likely multifactorial from the onset seizure, neurology has seen the patient and started on Keppra. You is negative urine culture is pending. Urine for Legionella antigen as well as strep pneumo urinary antigen are negative. TSH is 0.566, B12 584. Pamelor has been discontinued. Continue Ativan 1 mg IV every 4 hours when necessary. MRI brain pending. Neurology following   Acute diastolic CHF Improving with IV Lasix Patient has elevated BNP 9249.0, Started on lasix 20 mg IV q 12hr. patient had good diuretic response, we'll continue with Lasix 20 mg IV every 12 hours monitor) output. Recheck chest x-ray portable today.  Dysphagia Will obtain bedside swallow evaluation.  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 EEG was done by neurology which showed generalized nonspecific cerebral dysfunction no seizure or syncopal  predisposition  recorded in the study. Continued Keppra.  Diarrhea Cdiff PCR is pending. 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 Julien Nordmann following  History of Stroke Rectal ASA for now until pt is lucid  Hypokalemia Replace potassium Check BMP in a.m.  COPD Continue DuoNeb nebulizers every 6 hours  Hypertension Patient has elevated blood pressure with tachycardia. We'll start the patient on metoprolol 2.5 mg IV every 6 hours.   Family Communication:   No family at bedside Disposition Plan:   Home when medically stable    Procedures/Studies: Dg Chest 2 View  10/13/2013   CLINICAL DATA:  Weakness, currently being treated for pneumonia; on therapy for metastatic lung malignancy, history of COPD and previous tobacco use  EXAM: CHEST  2 VIEW  COMPARISON:  PA and lateral chest x-ray and CT scan of the chest dated September 24, 2013.  FINDINGS: The lungs are hyperinflated with hemidiaphragm flattening. There is no pleural effusion or pneumothorax. There is stable parenchymal density medially in the left upper lobe consistent with known malignancy. The heart and mediastinal structures are unremarkable. The bony thorax exhibits no acute abnormalities.  IMPRESSION: COPD without evidence of pneumonia. There is a stable parenchymal mass in the medial aspect of the left upper lobe.   Electronically Signed   By: David  Martinique   On: 10/13/2013 15:01   Dg Chest 2 View  09/24/2013   CLINICAL DATA:  Shortness of breath, weakness, history of lung cancer  EXAM: CHEST  2 VIEW  COMPARISON:  CT chest dated 06/19/2013  FINDINGS: Chronic interstitial markings. Medial left upper lobe nodule, similar  to prior CT chest, worrisome for metastatic disease. Mild patchy bilateral lower lobe opacities, atelectasis versus pneumonia. No pleural effusion or pneumothorax.  Heart is normal in size.  Degenerative  changes of the visualized thoracolumbar spine.  IMPRESSION: Medial left upper lobe nodule, similar to prior CT chest, worrisome for metastatic disease.  Mild patchy bilateral lower lobe opacities, atelectasis versus pneumonia.   Electronically Signed   By: Julian Hy M.D.   On: 09/24/2013 21:33   Ct Head Wo Contrast  10/20/2013   CLINICAL DATA:  Altered mental status following a seizure. No history of seizures. Completed whole brain radiation therapy on 06/16/2013 for lung cancer with brain metastases.  EXAM: CT HEAD WITHOUT CONTRAST  TECHNIQUE: Contiguous axial images were obtained from the base of the skull through the vertex without intravenous contrast.  COMPARISON:  Brain MR dated 09/09/2013 and head CT dated 05/22/2013.  FINDINGS: The previously demonstrated 2.5 x 2.5 cm low density component of the left frontal lobe mass is smaller and less rounded without the previously associated surrounding soft tissue component. The residual low density at this location measures 2.0 x 1.7 cm on image number 27. The previously seen 2.7 x 2.3 cm right parietal occipital mass with central low density has resolved with wedge shaped low density currently seen at that location involving the gray and white matter. The previously demonstrated right temporal mass has been replaced with an area of lower density. No new masses are seen and no intracranial hemorrhage or CT evidence of acute infarction. An old left thalamic lacunar infarct is unchanged. There is interval extensive confluent white matter low density in both cerebral hemispheres. Unremarkable bones. 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  IMPRESSION: 1. No acute abnormality. 2. Treated metastases, as described above. 3. Postradiation cerebral white matter changes.   Electronically Signed   By: Enrique Sack M.D.   On: 10/20/2013 20:58   Ct Angio Chest Pe W/cm &/or Wo Cm  09/24/2013    CLINICAL DATA:  Lung cancer with brain metastases, pneumonia, evaluate for PE  EXAM: CT ANGIOGRAPHY CHEST WITH CONTRAST  TECHNIQUE: Multidetector CT imaging of the chest was performed using the standard protocol during bolus administration of intravenous contrast. Multiplanar CT image reconstructions and MIPs were obtained to evaluate the vascular anatomy.  CONTRAST:  159mL OMNIPAQUE IOHEXOL 350 MG/ML SOLN  COMPARISON:  CT chest dated 06/19/2013  FINDINGS: No evidence of pulmonary embolism.  1.4 x 1.7 cm medial left upper lobe nodule (series 12/image 34), previously 1.3 x 1.0 cm, suspicious for metastasis. Additional clustered satellite nodularity in the left upper lobe (series 12/image 28).  Multifocal patchy opacity predominantly in the right lower lobe (series 12/image 90), and to a lesser extent in the right middle lobe (series 12/ image 69) and left lower lobe (series 12/ image 86), suspicious for multifocal pneumonia.  Underlying mild centrilobular emphysematous changes. No pleural effusion or pneumothorax.  Visualized thyroid is unremarkable.  The heart is normal in size. No pericardial effusion. Coronary atherosclerosis. Atherosclerotic calcifications of the aortic arch.  Progression of thoracic lymphadenopathy, including:  --16 mm short axis left axillary node (series 5/ image 29), previously 11 mm  --9 mm short axis subcarinal node (series 5/ image 55), previously 8 mm  --7 mm short axis right hilar node (series 5/image 56), new  Visualized upper abdomen is notable for mild nodular thickening of the left adrenal gland (series 5/image 104), indeterminate, early metastasis not excluded.  Degenerative changes  of the visualized thoracolumbar spine. Exaggerated thoracic kyphosis. Cervical spine fixation hardware.  Review of the MIP images confirms the above findings.  IMPRESSION: No evidence of pulmonary embolism.  1.7 cm medial left upper lobe nodule, previously 1.3 cm, suspicious for metastasis. Progression  of associated thoracic lymphadenopathy. Possible early left adrenal metastasis, indeterminate.  Multifocal pneumonia, right lower lobe predominant.   Electronically Signed   By: Julian Hy M.D.   On: 09/24/2013 23:07         Subjective: Patient seen, he is calmer today. MRI brain is still pending. Patient unable to swallow.  Objective: Filed Vitals:   10/24/13 0600 10/24/13 0800 10/24/13 0842 10/24/13 1000  BP: 134/78 150/70  130/75  Pulse: 96 100  109  Temp:  98.1 F (36.7 C)    TempSrc:  Axillary    Resp: 22 19  24   Height:      Weight:      SpO2: 95% 95% 94% 93%    Intake/Output Summary (Last 24 hours) at 10/24/13 1159 Last data filed at 10/24/13 1151  Gross per 24 hour  Intake 2246.25 ml  Output   2675 ml  Net -428.75 ml   Weight change:  Exam:  Physical Exam: Head: Normocephalic, atraumatic.  Neck: supple,No deformities, masses, or tenderness noted. Lungs: Normal respiratory effort. Decreased breath sounds at lung bases Heart: Regular RR. S1 and S2 normal  Abdomen: BS normoactive. Soft, Nondistended, non-tender.  Extremities: No pretibial edema, no erythema   Data Reviewed: Basic Metabolic Panel:  Recent Labs Lab 10/20/13 2330 10/21/13 0220 10/21/13 1240 10/22/13 0325 10/23/13 0332 10/24/13 0335  NA  --  133* 133* 133* 134* 135*  K  --  5.0 5.6* 4.0 3.0* 3.1*  CL  --  98 99 98 97 96  CO2  --  25 23 23 27 27   GLUCOSE  --  84 78 135* 123* 122*  BUN  --  6 5* 7 4* 4*  CREATININE  --  0.56 0.56 0.55 0.49* 0.52  CALCIUM  --  8.5 8.6 8.3* 8.2* 8.5  MG 1.5  --   --   --   --   --    Liver Function Tests:  Recent Labs Lab 10/20/13 1507 10/21/13 0220  AST 12 15  ALT 9 9  ALKPHOS 101 96  BILITOT 0.6 0.5  PROT 6.4 6.3  ALBUMIN 3.0* 2.9*   No results found for this basename: LIPASE, AMYLASE,  in the last 168 hours  Recent Labs Lab 10/21/13 1318  AMMONIA 59   CBC:  Recent Labs Lab 10/20/13 1507 10/21/13 0220 10/21/13 0645  WBC  5.2 7.4 6.1  HGB 12.4* 13.8 13.9  HCT 37.1* 40.4 42.7  MCV 89.4 89.0 90.1  PLT 256 268 244   Cardiac Enzymes: No results found for this basename: CKTOTAL, CKMB, CKMBINDEX, TROPONINI,  in the last 168 hours BNP: No components found with this basename: POCBNP,  CBG:  Recent Labs Lab 10/23/13 1235 10/23/13 1624 10/23/13 1934 10/23/13 2351 10/24/13 0825  GLUCAP 97 91 108* 118* 130*    Recent Results (from the past 240 hour(s))  MRSA PCR SCREENING     Status: None   Collection Time    10/20/13 10:38 PM      Result Value Ref Range Status   MRSA by PCR NEGATIVE  NEGATIVE Final   Comment:            The GeneXpert MRSA Assay (FDA     approved  for NASAL specimens     only), is one component of a     comprehensive MRSA colonization     surveillance program. It is not     intended to diagnose MRSA     infection nor to guide or     monitor treatment for     MRSA infections.  URINE CULTURE     Status: None   Collection Time    10/21/13  4:30 PM      Result Value Ref Range Status   Specimen Description URINE, CLEAN CATCH   Final   Special Requests NONE   Final   Culture  Setup Time     Final   Value: 10/22/2013 02:07     Performed at [place]     Final   Value: 30,000 COLONIES/ML     Performed at [facility]   Culture     Final   Value: Multiple bacterial morphotypes present, none predominant. Suggest appropriate recollection if clinically indicated.     Performed at [facility]   Report Status 10/22/2013 FINAL   Final     Scheduled Meds: . antiseptic oral rinse  7 mL Mouth Rinse q12n4p  . chlorhexidine  15 mL Mouth Rinse BID  . enoxaparin (LOVENOX) injection  40 mg Subcutaneous QHS  . feeding supplement (ENSURE COMPLETE)  237 mL Oral QHS  . furosemide  20 mg Intravenous Q12H  . ipratropium-albuterol  3 mL Nebulization TID  . levETIRAcetam  500 mg Intravenous BID  . LORazepam  1 mg Intravenous Once  . metoprolol  2.5 mg  Intravenous 4 times per day  . potassium chloride  10 mEq Intravenous Q1 Hr x 3   Continuous Infusions: . dextrose 5 % and 0.9% NaCl 1,000 mL infusion 75 mL/hr at 10/24/13 0100     [facility] S, MD [facility] Pager [phone]  Time spent 35 minutes  If 7PM-7AM, please contact night-coverage [email or web address] Password [email or web address] 10/24/2013, 11:59 AM   LOS: 4 days

## 2013-10-25 LAB — BASIC METABOLIC PANEL
Anion gap: 13 (ref 5–15)
BUN: 6 mg/dL (ref 6–23)
CO2: 26 meq/L (ref 19–32)
Calcium: 8.9 mg/dL (ref 8.4–10.5)
Chloride: 96 mEq/L (ref 96–112)
Creatinine, Ser: 0.6 mg/dL (ref 0.50–1.35)
GFR calc Af Amer: >90 mL/min (ref >90)
GFR calc non Af Amer: >90 mL/min (ref >90)
Glucose, Bld: 121 mg/dL — ABNORMAL HIGH (ref 70–99)
POTASSIUM: 3.5 meq/L — AB (ref 3.7–5.3)
SODIUM: 135 meq/L — AB (ref 137–147)

## 2013-10-25 LAB — CBC
HCT: 40.1 % (ref 39.0–52.0)
HEMOGLOBIN: 13.8 g/dL (ref 13.0–17.0)
MCH: 29.9 pg (ref 26.0–34.0)
MCHC: 34.4 g/dL (ref 30.0–36.0)
MCV: 86.8 fL (ref 78.0–100.0)
Platelets: 219 10*3/uL (ref 150–400)
RBC: 4.62 MIL/uL (ref 4.22–5.81)
RDW: 15 % (ref 11.5–15.5)
WBC: 9.1 10*3/uL (ref 4.0–10.5)

## 2013-10-25 LAB — GLUCOSE, CAPILLARY
Glucose-Capillary: 121 mg/dL — ABNORMAL HIGH (ref 70–99)
Glucose-Capillary: 118 mg/dL — ABNORMAL HIGH (ref 70–99)
Glucose-Capillary: 108 mg/dL — ABNORMAL HIGH (ref 70–99)

## 2013-10-25 MED ORDER — IPRATROPIUM-ALBUTEROL 0.5-2.5 (3) MG/3ML IN SOLN: 3.0000 mL | Freq: Four times a day (QID) | RESPIRATORY_TRACT | Status: DC | PRN

## 2013-10-25 MED ORDER — SODIUM CHLORIDE 0.9 % IV SOLN
INTRAVENOUS | Status: DC
  Administered 2013-10-25: 17:00:00 via INTRAVENOUS

## 2013-10-25 MED ORDER — LORAZEPAM 2 MG/ML IJ SOLN
1.0000 mg | INTRAMUSCULAR | Status: DC | PRN
  Administered 2013-10-25: 1 mg via INTRAVENOUS
  Filled 2013-10-25: qty 1

## 2013-10-25 NOTE — Progress Notes (Signed)
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 The patient was recently discharged from the hospital on 09/27/2013 and finished antibiotics for CAP. C. difficile PCR has been ordered. While in the emergency department, the patient had a tonic-clonic seizure that was witnessed by RN which spontaneously resolved without any medication. However, the patient was confused and agitated in his post ictal state, and the patient was given Ativan for agitation.   the patient has not had any seizure activity since that time. However, he remains encephalopathic. The patient received 1mg  Ativan x 2 night of 10/20/13.  Assessment/Plan:  Acute encephalopathy/delirium Patient continues to be agitated, likely multifactorial from the onset seizure, neurology has seen the patient and started on Keppra. UA is negative urine culture is negative. Urine for Legionella antigen as well as strep pneumo urinary antigen are negative. TSH is 0.566, B12 584. Pamelor has been discontinued. Will hold the ativan for now to see if it improved drowsiness, MRI attempted twice but unable to perform.    Acute diastolic CHF Improving with IV Lasix Patient has elevated BNP 9249.0, Started on lasix 20 mg IV q 12hr. patient had good diuretic response with IV Lasix, at this time I'm going to the skin 2 Lasix as patient seems to be back to baseline, lungs are clear to auscultation.   Dysphagia Bedside evaluation done patient is too somnolent to eat so they recommend n.p.o. status is for now.  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 EEG was done by  neurology which showed generalized nonspecific cerebral dysfunction no seizure or syncopal  predisposition recorded in the study. Continued Keppra.  Diarrhea Cdiff PCR is pending. 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 Julien Nordmann following  History of Stroke Rectal ASA for now until pt is lucid  Hypokalemia Replace potassium Check BMP in a.m.  COPD Continue DuoNeb nebulizers every 6 hours when necessary  Hypertension Patient has elevated blood pressure with tachycardia. We'll start the patient on metoprolol 2.5 mg IV every 6 hours.   Family Communication:   Discussed with patient's son and wife at bedside, discussed the CODE STATUS, and inability to perform MRI of the brain. Also discussed the feeding options at this time family does not want to consider NG tube feedings or PEG tube feedings. If patient does not improve from this encephalopathy, family is agreeable to getting palliative care consultation to discuss the goals of care  Disposition Plan:   Home when medically stable    Procedures/Studies: Dg Chest 2 View  10/13/2013   CLINICAL DATA:  Weakness, currently being treated for pneumonia; on therapy for metastatic lung malignancy, history of COPD and previous tobacco use  EXAM: CHEST  2 VIEW  COMPARISON:  PA and lateral chest x-ray and CT scan of the chest dated September 24, 2013.  FINDINGS: The lungs are hyperinflated with hemidiaphragm flattening. There is no pleural effusion or pneumothorax. There is stable parenchymal density medially in the left upper lobe consistent with known malignancy. The heart and mediastinal structures are  unremarkable. The bony thorax exhibits no acute abnormalities.  IMPRESSION: COPD without evidence of pneumonia. There is a stable parenchymal mass in the medial aspect of the left upper lobe.   Electronically Signed   By:  David  Martinique   On: 10/13/2013 15:01   Dg Chest 2 View  09/24/2013   CLINICAL DATA:  Shortness of breath, weakness, history of lung cancer  EXAM: CHEST  2 VIEW  COMPARISON:  CT chest dated 06/19/2013  FINDINGS: Chronic interstitial markings. Medial left upper lobe nodule, similar to prior CT chest, worrisome for metastatic disease. Mild patchy bilateral lower lobe opacities, atelectasis versus pneumonia. No pleural effusion or pneumothorax.  Heart is normal in size.  Degenerative changes of the visualized thoracolumbar spine.  IMPRESSION: Medial left upper lobe nodule, similar to prior CT chest, worrisome for metastatic disease.  Mild patchy bilateral lower lobe opacities, atelectasis versus pneumonia.   Electronically Signed   By: Julian Hy M.D.   On: 09/24/2013 21:33   Ct Head Wo Contrast  10/20/2013   CLINICAL DATA:  Altered mental status following a seizure. No history of seizures. Completed whole brain radiation therapy on 06/16/2013 for lung cancer with brain metastases.  EXAM: CT HEAD WITHOUT CONTRAST  TECHNIQUE: Contiguous axial images were obtained from the base of the skull through the vertex without intravenous contrast.  COMPARISON:  Brain MR dated 09/09/2013 and head CT dated 05/22/2013.  FINDINGS: The previously demonstrated 2.5 x 2.5 cm low density component of the left frontal lobe mass is smaller and less rounded without the previously associated surrounding soft tissue component. The residual low density at this location measures 2.0 x 1.7 cm on image number 27. The previously seen 2.7 x 2.3 cm right parietal occipital mass with central low density has resolved with wedge shaped low density currently seen at that location involving the gray and white matter. The previously demonstrated right temporal mass has been replaced with an area of lower density. No new masses are seen and no intracranial hemorrhage or CT evidence of acute infarction. An old left thalamic lacunar infarct is  unchanged. There is interval extensive confluent white matter low density in both cerebral hemispheres. Unremarkable bones. 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  IMPRESSION: 1. No acute abnormality. 2. Treated metastases, as described above. 3. Postradiation cerebral white matter changes.   Electronically Signed   By: Enrique Sack M.D.   On: 10/20/2013 20:58   Ct Angio Chest Pe W/cm &/or Wo Cm  09/24/2013   CLINICAL DATA:  Lung cancer with brain metastases, pneumonia, evaluate for PE  EXAM: CT ANGIOGRAPHY CHEST WITH CONTRAST  TECHNIQUE: Multidetector CT imaging of the chest was performed using the standard protocol during bolus administration of intravenous contrast. Multiplanar CT image reconstructions and MIPs were obtained to evaluate the vascular anatomy.  CONTRAST:  116mL OMNIPAQUE IOHEXOL 350 MG/ML SOLN  COMPARISON:  CT chest dated 06/19/2013  FINDINGS: No evidence of pulmonary embolism.  1.4 x 1.7 cm medial left upper lobe nodule (series 12/image 34), previously 1.3 x 1.0 cm, suspicious for metastasis. Additional clustered satellite nodularity in the left upper lobe (series 12/image 28).  Multifocal patchy opacity predominantly in the right lower lobe (series 12/image 90), and to a lesser extent in the right middle lobe (series 12/ image 69) and left lower lobe (series 12/ image 86), suspicious for multifocal pneumonia.  Underlying mild centrilobular emphysematous changes. No pleural effusion or pneumothorax.  Visualized thyroid is unremarkable.  The heart is normal in size. No pericardial effusion. Coronary atherosclerosis. Atherosclerotic calcifications of the aortic arch.  Progression of thoracic lymphadenopathy, including:  --16 mm short axis left axillary node (series 5/ image 29), previously 11 mm  --9 mm short axis subcarinal node (series 5/ image 55), previously 8 mm  --7 mm short axis right hilar node (series 5/image 56),  new  Visualized upper abdomen is notable for mild nodular thickening of the left adrenal gland (series 5/image 104), indeterminate, early metastasis not excluded.  Degenerative changes of the visualized thoracolumbar spine. Exaggerated thoracic kyphosis. Cervical spine fixation hardware.  Review of the MIP images confirms the above findings.  IMPRESSION: No evidence of pulmonary embolism.  1.7 cm medial left upper lobe nodule, previously 1.3 cm, suspicious for metastasis. Progression of associated thoracic lymphadenopathy. Possible early left adrenal metastasis, indeterminate.  Multifocal pneumonia, right lower lobe predominant.   Electronically Signed   By: Julian Hy M.D.   On: 09/24/2013 23:07         Subjective: Patient seen, he is calmer today. MRI brain could not be done due to agitation.  Objective: Filed Vitals:   10/25/13 0300 10/25/13 0400 10/25/13 0500 10/25/13 0800  BP:  129/64  107/55  Pulse: 103 103 98   Temp:    98.5 F (36.9 C)  TempSrc:    Oral  Resp: 21 18 18 20   Height:      Weight:      SpO2: 100% 100% 100% 100%    Intake/Output Summary (Last 24 hours) at 10/25/13 1011 Last data filed at 10/25/13 1000  Gross per 24 hour  Intake   2300 ml  Output   2425 ml  Net   -125 ml   Weight change:  Exam:  Physical Exam: Head: Normocephalic, atraumatic.  Neck: supple,No deformities, masses, or tenderness noted. Lungs: Normal respiratory effort. Clear bilaterally Heart: Regular RR. S1 and S2 normal  Abdomen: BS normoactive. Soft, Nondistended, non-tender.  Extremities: No pretibial edema, no erythema   Data Reviewed: Basic Metabolic Panel:  Recent Labs Lab 10/20/13 2330  10/21/13 1240 10/22/13 0325 10/23/13 0332 10/24/13 0335 10/25/13 0355  NA  --   < > 133* 133* 134* 135* 135*  K  --   < > 5.6* 4.0 3.0* 3.1* 3.5*  CL  --   < > 99 98 97 96 96  CO2  --   < > 23 23 27 27 26   GLUCOSE  --   < > 78 135* 123* 122* 121*  BUN  --   < > 5* 7 4* 4* 6    CREATININE  --   < > 0.56 0.55 0.49* 0.52 0.60  CALCIUM  --   < > 8.6 8.3* 8.2* 8.5 8.9  MG 1.5  --   --   --   --   --   --   < > = values in this interval not displayed. Liver Function Tests:  Recent Labs Lab 10/20/13 1507 10/21/13 0220  AST 12 15  ALT 9 9  ALKPHOS 101 96  BILITOT 0.6 0.5  PROT 6.4 6.3  ALBUMIN 3.0* 2.9*   No results found for this basename: LIPASE, AMYLASE,  in the last 168 hours  Recent Labs Lab 10/21/13 1318  AMMONIA 59   CBC:  Recent Labs Lab 10/20/13 1507 10/21/13 0220 10/21/13 0645 10/25/13 0355  WBC 5.2 7.4 6.1 9.1  HGB 12.4* 13.8 13.9 13.8  HCT 37.1* 40.4 42.7 40.1  MCV  89.4 89.0 90.1 86.8  PLT 256 268 244 219   Cardiac Enzymes: No results found for this basename: CKTOTAL, CKMB, CKMBINDEX, TROPONINI,  in the last 168 hours BNP: No components found with this basename: POCBNP,  CBG:  Recent Labs Lab 10/24/13 0825 10/24/13 1143 10/24/13 1736 10/24/13 2004 10/25/13 0825  GLUCAP 130* 106* 107* 105* 121*    Recent Results (from the past 240 hour(s))  MRSA PCR SCREENING     Status: None   Collection Time    10/20/13 10:38 PM      Result Value Ref Range Status   MRSA by PCR NEGATIVE  NEGATIVE Final   Comment:            The GeneXpert MRSA Assay (FDA     approved for NASAL specimens     only), is one component of a     comprehensive MRSA colonization     surveillance program. It is not     intended to diagnose MRSA     infection nor to guide or     monitor treatment for     MRSA infections.  URINE CULTURE     Status: None   Collection Time    10/21/13  4:30 PM      Result Value Ref Range Status   Specimen Description URINE, CLEAN CATCH   Final   Special Requests NONE   Final   Culture  Setup Time     Final   Value: 10/22/2013 02:07     Performed at [facility]     Final   Value: 30,000 COLONIES/ML     Performed at [facility]   Culture     Final   Value: Multiple bacterial  morphotypes present, none predominant. Suggest appropriate recollection if clinically indicated.     Performed at [facility]   Report Status 10/22/2013 FINAL   Final     Scheduled Meds: . antiseptic oral rinse  7 mL Mouth Rinse q12n4p  . chlorhexidine  15 mL Mouth Rinse BID  . enoxaparin (LOVENOX) injection  40 mg Subcutaneous QHS  . feeding supplement (ENSURE COMPLETE)  237 mL Oral QHS  . ipratropium-albuterol  3 mL Nebulization TID  . levETIRAcetam  500 mg Intravenous BID  . metoprolol  2.5 mg Intravenous 4 times per day   Continuous Infusions: . dextrose 5 % and 0.9% NaCl 1,000 mL infusion 75 mL/hr at 10/25/13 0518     Oswald Hillock, MD [facility] Pager [phone]  Time spent 35 minutes  If 7PM-7AM, please contact night-coverage [email or web address] Password [email or web address] 10/25/2013, 10:11 AM   LOS: 5 days

## 2013-10-26 DIAGNOSIS — Z66 Do not resuscitate: Secondary | ICD-10-CM | POA: Diagnosis not present

## 2013-10-26 DIAGNOSIS — Z515 Encounter for palliative care: Secondary | ICD-10-CM

## 2013-10-26 DIAGNOSIS — E43 Unspecified severe protein-calorie malnutrition: Secondary | ICD-10-CM

## 2013-10-26 LAB — GLUCOSE, CAPILLARY
GLUCOSE-CAPILLARY: 91 mg/dL (ref 70–99)
GLUCOSE-CAPILLARY: 85 mg/dL (ref 70–99)
GLUCOSE-CAPILLARY: 77 mg/dL (ref 70–99)
GLUCOSE-CAPILLARY: 163 mg/dL — AB (ref 70–99)
Glucose-Capillary: 98 mg/dL (ref 70–99)
Glucose-Capillary: 65 mg/dL — ABNORMAL LOW (ref 70–99)
Glucose-Capillary: 57 mg/dL — ABNORMAL LOW (ref 70–99)
Glucose-Capillary: 151 mg/dL — ABNORMAL HIGH (ref 70–99)

## 2013-10-26 LAB — CORTISOL-AM, BLOOD: Cortisol - AM: 19.5 ug/dL (ref 4.3–22.4)

## 2013-10-26 MED ORDER — ATROPINE SULFATE 1 % OP SOLN
2.0000 [drp] | OPHTHALMIC | Status: DC | PRN
  Administered 2013-10-26: 4 [drp] via SUBLINGUAL
  Filled 2013-10-26: qty 2

## 2013-10-26 MED ORDER — LORAZEPAM 2 MG/ML IJ SOLN
0.5000 mg | INTRAMUSCULAR | Status: DC
  Administered 2013-10-26 (×2): 0.5 mg via INTRAVENOUS
  Filled 2013-10-26 (×3): qty 1

## 2013-10-26 MED ORDER — LORAZEPAM 2 MG/ML IJ SOLN
1.0000 mg | INTRAMUSCULAR | Status: DC | PRN
  Administered 2013-10-26 – 2013-10-27 (×2): 1 mg via INTRAVENOUS
  Filled 2013-10-26 (×2): qty 1

## 2013-10-26 MED ORDER — DEXTROSE-NACL 5-0.45 % IV SOLN
INTRAVENOUS | Status: DC
  Administered 2013-10-26: 18:00:00 via INTRAVENOUS

## 2013-10-26 MED ORDER — SCOPOLAMINE 1 MG/3DAYS TD PT72
1.0000 | MEDICATED_PATCH | TRANSDERMAL | Status: DC
  Administered 2013-10-26: 1.5 mg via TRANSDERMAL
  Filled 2013-10-26: qty 1

## 2013-10-26 MED ORDER — DEXTROSE 50 % IV SOLN
INTRAVENOUS | Status: AC
  Filled 2013-10-26: qty 50

## 2013-10-26 MED ORDER — IPRATROPIUM-ALBUTEROL 0.5-2.5 (3) MG/3ML IN SOLN
3.0000 mL | RESPIRATORY_TRACT | Status: DC
  Administered 2013-10-26 – 2013-10-27 (×4): 3 mL via RESPIRATORY_TRACT
  Filled 2013-10-26 (×2): qty 3
  Filled 2013-10-26: qty 6
  Filled 2013-10-26: qty 3

## 2013-10-26 MED ORDER — HYDROMORPHONE HCL 1 MG/ML IJ SOLN
0.5000 mg | INTRAMUSCULAR | Status: DC | PRN
  Administered 2013-10-26 – 2013-10-27 (×2): 0.5 mg via INTRAVENOUS
  Filled 2013-10-26 (×2): qty 1

## 2013-10-26 MED ORDER — MAGIC MOUTHWASH W/LIDOCAINE
10.0000 mL | Freq: Three times a day (TID) | ORAL | Status: DC
  Administered 2013-10-26 (×2): 10 mL via ORAL
  Filled 2013-10-26 (×5): qty 10

## 2013-10-26 MED ORDER — FLUCONAZOLE IN SODIUM CHLORIDE 200-0.9 MG/100ML-% IV SOLN
200.0000 mg | Freq: Once | INTRAVENOUS | Status: AC
  Administered 2013-10-26: 200 mg via INTRAVENOUS
  Filled 2013-10-26: qty 100

## 2013-10-26 MED ORDER — DEXTROSE 50 % IV SOLN
50.0000 mL | Freq: Once | INTRAVENOUS | Status: AC
  Administered 2013-10-26: 50 mL via INTRAVENOUS

## 2013-10-26 MED ORDER — SODIUM CHLORIDE 0.9 % IJ SOLN
10.0000 mL | INTRAMUSCULAR | Status: DC | PRN
Start: 2013-10-26 — End: 2013-10-27

## 2013-10-26 MED ORDER — DEXTROSE 50 % IV SOLN
INTRAVENOUS | Status: AC
  Administered 2013-10-26: 50 mL
  Filled 2013-10-26: qty 50

## 2013-10-26 NOTE — Progress Notes (Signed)
Clinical Social Work Department BRIEF PSYCHOSOCIAL ASSESSMENT 10/26/2013  Patient:  [name], [name]     Account Number:  [id number]     Admit date:  10/20/2013  Clinical Social Worker:  Ulyess Blossom  Date/Time:  10/26/2013 01:30 PM  Referred by:  Physician  Date Referred:  10/26/2013 Referred for  Residential hospice placement   Other Referral:   Interview type:  Family Other interview type:    PSYCHOSOCIAL DATA Living Status:  WIFE Admitted from facility:   Level of care:   Primary support name:  [name] Primary support relationship to patient:  SPOUSE Degree of support available:   strong    CURRENT CONCERNS Current Concerns  Post-Acute Placement   Other Concerns:    SOCIAL WORK ASSESSMENT / PLAN CSW received phone call from Palliative Medicine MD, Dr. Hilma [name] stating that PMT [place] was held and pt and pt family agreeable to residential hospice. Per Dr. Hilma [name], pt and pt family have a strong preference for [facility] and are very anxious about referral being initiated.    CSW contacted [facility] as pt family had made it clear to Dr. Hilma [name] their wishes for [facility] and faxed referral to [facility].    CSW received return phone call from [facility] stating that pt has bed available tomorrow, Monday 10/12. Per [facility], facility asking if pt will have central access as it appears that pt needs central access. CSW spoke with Dr. Hilma [name] who reported that pt is to have PICC line placed today. CSW updated [facility] regarding PICC line.    CSW met with pt family at bedside. Pt family had already been notified by Dr. Hilma [name] of [facility] bed available tomorrow. CSW provided psychoeducation surrounding process of transition to [facility]. Pt wife confirmed that she would be available at 9 am to complete paperwork with [facility].    CSW notified MD.    CSW to facilitate pt discharge needs to [facility] tomorrow  10/27/13.   Assessment/plan status:  Psychosocial Support/Ongoing Assessment of Needs Other assessment/ plan:   discharge planning   Information/referral to community resources:   Referral to [facility]    PATIENT'S/FAMILY'S RESPONSE TO PLAN OF CARE: Pt resting comfortably at this time. Pt has strong family support as evidenced by multiple family member surrounding pt at bedside. Pt family less anxious during time of visit now that they are aware of [facility] bed available tomorrow 10/12. CSW provided support and pt family appreciative of CSW assistance.    [name], MSW, LCSW Clinical Social Work Weekend coverage [phone]

## 2013-10-26 NOTE — Consult Note (Signed)
Palliative Medicine Team at [facility]  Date: 10/26/2013   Patient Name: [name]  DOB: Feb 12, 1950  MRN: [id number]  Age / Sex: 63 y.o., male   PCP: Marletta Lor, MD Referring Physician: Oswald Hillock, MD  Active Problems: Principal Problem:   Seizure Active Problems:   History of cardiovascular disorder   Non-small cell carcinoma of left lung, stage 4   Hypokalemia   Diarrhea   Acute encephalopathy   COPD bronchitis   HPI/Reason for Consultation: 63 year old man with a history of stage IV SCC of the lung diagnosed 04/2012 with mets to the brain stroke, hypertension, hyperlipidemia, and COPD followed by Dr. Earlie Server presented to the emergency department because of diarrhea, poor PO intake, dehydration and altered mental status and he was just discharged on 09/27/2013. He had a tonic-clonic seizure in the ER which spontaneously resolved without any medication. He has remained agitated since that time. He has severe dyspnea and congestion. PMT consulted for goals of care-primary team discussed CODE STATUS with family. PMT paged by RN that family requested PMT meeting prior to transfer to med-surg unit and needed goals of care discussion.  Participants in Discussion: Patient's wife and SIL are primary support.   Advance Directive: none   Code Status Orders        Start     Ordered   10/25/13 0906  Do not attempt resuscitation (DNR)   Continuous    Question Answer Comment  In the event of cardiac or respiratory ARREST Do not call a "code blue"   In the event of cardiac or respiratory ARREST Do not perform Intubation, CPR, defibrillation or ACLS   In the event of cardiac or respiratory ARREST Use medication by any route, position, wound care, and other measures to relive pain and suffering. May use oxygen, suction and manual treatment of airway obstruction as needed for comfort.      10/25/13 0905       I have reviewed the medical record, interviewed the patient  and family, and examined the patient. The following aspects are pertinent.  Past Medical History  Diagnosis Date  . BENIGN PROSTATIC HYPERTROPHY 12/24/2009  . CEREBROVASCULAR ACCIDENT, HX OF 12/24/2009  . HYPERLIPIDEMIA 12/24/2009  . HYPERTENSION 12/24/2009  . Testosterone deficiency   . ED (erectile dysfunction)   . Claudication in peripheral vascular disease, lifestyle limiting, with abnormal arterial dopplers of lower ext. 06/30/2011  . S/P angioplasty with stent to Lt. SFA 06/30/11 06/30/2011  . COPD 12/24/2009  . Shortness of breath     "from the COPD" (10/13/2011)  . Stroke 1996    "right side not as strong as left since; numbness R leg/foot" (10/13/2011)  . Pernicious anemia   . Complication of anesthesia     hard to wake up  . Pneumonia   . 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 Brain metastases 05/22/13    ct head   . History of radiation therapy 06/02/13-06/16/13    brain   History   Social History  . Marital Status: Married    Spouse Name: N/A    Number of Children: 3  . Years of Education: N/A   Occupational History  . Retired     Press photographer   Social History Main Topics  . Smoking status: Current Every Day Smoker -- 0.75 packs/day for 45 years    Types: Cigarettes  . Smokeless tobacco: Never Used     Comment: cutting back  .  Alcohol Use: No  . Drug Use: No  . Sexual Activity: Not Currently   Other Topics Concern  . None   Social History Narrative  . None   Family History  Problem Relation Age of Onset  . Emphysema Father     smoked  . Emphysema Paternal Uncle     smoked  . Heart disease Mother   . Lung cancer Father     smoked  . Lung cancer Paternal Grandfather     smoked   Scheduled Meds: . antiseptic oral rinse  7 mL Mouth Rinse q12n4p  . chlorhexidine  15 mL Mouth Rinse BID  . ipratropium-albuterol  3 mL Nebulization Q4H  . levETIRAcetam  500 mg Intravenous BID  . LORazepam  0.5 mg Intravenous Q4H  . scopolamine  1 patch  Transdermal Q72H   Continuous Infusions: . sodium chloride 50 mL/hr at 10/25/13 1711   PRN Meds:.acetaminophen, acetaminophen, atropine, feeding supplement (ENSURE), HYDROmorphone (DILAUDID) injection, ondansetron (ZOFRAN) IV Allergies  Allergen Reactions  . Morphine And Related Other (See Comments)    Patient's wife does not want morphine given due her belief that morphine causes death- she agrees to all other opiates for symptom control- education provided.   CBC:    Component Value Date/Time   WBC 9.1 10/25/2013 0355   WBC 8.6 06/19/2013 1247   HGB 13.8 10/25/2013 0355   HGB 13.0 06/19/2013 1247   HCT 40.1 10/25/2013 0355   HCT 38.8 06/19/2013 1247   PLT 219 10/25/2013 0355   PLT 217 06/19/2013 1247   MCV 86.8 10/25/2013 0355   MCV 92.8 06/19/2013 1247   NEUTROABS 3.9 10/13/2013 1640   NEUTROABS 6.7* 06/19/2013 1247   LYMPHSABS 0.8 10/13/2013 1640   LYMPHSABS 1.4 06/19/2013 1247   MONOABS 0.5 10/13/2013 1640   MONOABS 0.5 06/19/2013 1247   EOSABS 0.0 10/13/2013 1640   EOSABS 0.1 06/19/2013 1247   BASOSABS 0.0 10/13/2013 1640   BASOSABS 0.0 06/19/2013 1247   Comprehensive Metabolic Panel:    Component Value Date/Time   NA 135* 10/25/2013 0355   NA 131* 06/19/2013 1248   K 3.5* 10/25/2013 0355   K 3.3* 06/19/2013 1248   CL 96 10/25/2013 0355   CL 97* 06/13/2012 1300   CO2 26 10/25/2013 0355   CO2 25 06/19/2013 1248   BUN 6 10/25/2013 0355   BUN 7.2 08/29/2013 0817   CREATININE 0.60 10/25/2013 0355   CREATININE 0.8 08/29/2013 0817   CREATININE 0.65 03/03/2013 0908   GLUCOSE 121* 10/25/2013 0355   GLUCOSE 68* 06/19/2013 1248   GLUCOSE 123* 06/13/2012 1300   CALCIUM 8.9 10/25/2013 0355   CALCIUM 8.8 06/19/2013 1248   AST 15 10/21/2013 0220   AST 22 06/19/2013 1248   ALT 9 10/21/2013 0220   ALT 35 06/19/2013 1248   ALKPHOS 96 10/21/2013 0220   ALKPHOS 80 06/19/2013 1248   BILITOT 0.5 10/21/2013 0220   BILITOT 0.63 06/19/2013 1248   PROT 6.3 10/21/2013 0220   PROT 6.1* 06/19/2013 1248   ALBUMIN 2.9* 10/21/2013  0220   ALBUMIN 2.8* 06/19/2013 1248    Vital Signs: BP 116/58  Pulse 81  Temp(Src) 98.1 F (36.7 C) (Oral)  Resp 19  Ht 5\' 7"  (1.702 m)  Wt 47.3 kg (104 lb 4.4 oz)  BMI 16.33 kg/m2  SpO2 100% Filed Weights   10/20/13 2235 10/22/13 0058 10/23/13 0418  Weight: 47.7 kg (105 lb 2.6 oz) 47.7 kg (105 lb 2.6 oz) 47.3 kg (104 lb 4.4  oz)   10/10 0701 - 10/11 0700 In: 5638 [I.V.:1425; IV Piggyback:200] Out: 225 [Urine:225]  Physical Exam:  Significant distress in ICU, cachectic, trying to cough, tachypnic and agitated. He could not speak but made eye contact. Poor air movement. Extremity weakness. ecchymoses.   Summary of Established Goals of Care and Medical Treatment Preferences  Primary Diagnoses  Metastatic SCC Lung Cancer  Active Symptoms: 1. Severe dyspnea- his tumor is obstructing the main bronchus **family very concerned about his congestion they are requesting that he be suctioned. I had to provide extensive guidance and education on comfort care - his wife feelss that morphine will cause his death-I explained this and for her comfort she has agreed to let me use Dilaudid and that even if it just semantics it is important to her for him not to get this. Provide O2. Ativan for air hunger. 2. Severe Agitation/Post-ictal Encephaolpathy-multifactorial 1. He will need anti-seizure medication- we may need to place a PICC so that Rhodell can be given  2. IV Ativan 0.5mg  scheduled q4 hours and 1mg  q4 prn 3. Failure to thrive 1. He refuses anything by mouth-there may be mouth pain issues- but he would not allow for examination-will write for magic mouth wash with lidocaine PRN  Psycho-social/Spiritual:  Wife realistic but trying to understand the concept of shifting from the medical intervention to comfort- I provided education on why the "monitors in ICU" were not needed that we would focus on how he looks than the numbers. I asked them to consider what environment he would want to die in  and what might be important to him- sadly they never got to talk about this so his wife really doesn't know.  Prognosis: days, <week   Palliative Performance Scale: 10%  Recommendations:  1. Code Status: DNR 2. Scope of Treatment:    Full comfort care  Aggressive treatment of his chest congestion  Seizure prophylaxis  Symptom Management:  Complex needs as above  Suctioning  Scopolamine, Atropine, TCB as tolerated  Dilaudid prn  Ativan scheduled and PRN  Keppra IV BID/Seizure precautions  3. Palliative Prophylaxis: order set 4. Disposition:   I discussed Hospice with them- they are very interested in [facility] for his EOL care-they have asked "how soon can he get there"? I agree that he would greatly benefit from this level of care. They need continued education and support as he faces EOL.    Time In: 11:45 Time Out: 12:55  Time Total: 70 minutes Greater than 50%  of this time was spent counseling and coordinating care related to the above assessment and plan.  Signed by: Roma Schanz, DO  10/26/2013, 12:31 PM  Please contact Palliative Medicine Team phone at [phone] for questions and concerns.

## 2013-10-26 NOTE — Progress Notes (Signed)
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 The patient was recently discharged from the hospital on 09/27/2013 and finished antibiotics for CAP. C. difficile PCR has been ordered. While in the emergency department, the patient had a tonic-clonic seizure that was witnessed by RN which spontaneously resolved without any medication. However, the patient was confused and agitated in his post ictal state, and the patient was given Ativan for agitation.   the patient has not had any seizure activity since that time. However, he remains encephalopathic. The patient received 1mg  Ativan x 2 night of 10/20/13.  Assessment/Plan:  Acute encephalopathy/delirium Patient continues to be agitated, likely multifactorial from the onset seizure, neurology has seen the patient and started on Keppra. UA is negative urine culture is negative. Urine for Legionella antigen as well as strep pneumo urinary antigen are negative. TSH is 0.566, B12 584. Pamelor has been discontinued. Will hold the ativan for now to see if it improved drowsiness, MRI attempted twice but unable to perform.    Acute diastolic CHF Improving with IV Lasix Patient has elevated BNP 9249.0, Started on lasix 20 mg IV q 12hr. patient had good diuretic response with IV Lasix, at this time I'm going to the discontinue Lasix as patient seems to be back to baseline, lungs are clear to auscultation.   Dysphagia Bedside evaluation done patient is too somnolent to eat so they recommend n.p.o. status is for now.  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 EEG was done by  neurology which showed generalized nonspecific cerebral dysfunction no seizure or syncopal  predisposition recorded in the study. Continued Keppra.  Diarrhea Cdiff PCR is pending. 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 Julien Nordmann following  History of Stroke Rectal ASA for now until pt is lucid  Hypokalemia Replace potassium Check BMP in a.m.  COPD Continue DuoNeb nebulizers every 6 hours when necessary  Hypertension Patient has elevated blood pressure with tachycardia. We'll start the patient on metoprolol 2.5 mg IV every 6 hours.   Family Communication:   Discussed with patient's son and wife at bedside, discussed the CODE STATUS, and inability to perform MRI of the brain. Also discussed the feeding options at this time family does not want to consider NG tube feedings or PEG tube feedings. If patient does not improve from this encephalopathy, family is agreeable to getting palliative care consultation to discuss the goals of care  Disposition Plan:   Home when medically stable    Procedures/Studies: Dg Chest 2 View  10/13/2013   CLINICAL DATA:  Weakness, currently being treated for pneumonia; on therapy for metastatic lung malignancy, history of COPD and previous tobacco use  EXAM: CHEST  2 VIEW  COMPARISON:  PA and lateral chest x-ray and CT scan of the chest dated September 24, 2013.  FINDINGS: The lungs are hyperinflated with hemidiaphragm flattening. There is no pleural effusion or pneumothorax. There is stable parenchymal density medially in the left upper lobe consistent with known malignancy. The heart and mediastinal structures are unremarkable.  The bony thorax exhibits no acute abnormalities.  IMPRESSION: COPD without evidence of pneumonia. There is a stable parenchymal mass in the medial aspect of the left upper lobe.   Electronically Signed   By:  David  Martinique   On: 10/13/2013 15:01   Dg Chest 2 View  09/24/2013   CLINICAL DATA:  Shortness of breath, weakness, history of lung cancer  EXAM: CHEST  2 VIEW  COMPARISON:  CT chest dated 06/19/2013  FINDINGS: Chronic interstitial markings. Medial left upper lobe nodule, similar to prior CT chest, worrisome for metastatic disease. Mild patchy bilateral lower lobe opacities, atelectasis versus pneumonia. No pleural effusion or pneumothorax.  Heart is normal in size.  Degenerative changes of the visualized thoracolumbar spine.  IMPRESSION: Medial left upper lobe nodule, similar to prior CT chest, worrisome for metastatic disease.  Mild patchy bilateral lower lobe opacities, atelectasis versus pneumonia.   Electronically Signed   By: Julian Hy M.D.   On: 09/24/2013 21:33   Ct Head Wo Contrast  10/20/2013   CLINICAL DATA:  Altered mental status following a seizure. No history of seizures. Completed whole brain radiation therapy on 06/16/2013 for lung cancer with brain metastases.  EXAM: CT HEAD WITHOUT CONTRAST  TECHNIQUE: Contiguous axial images were obtained from the base of the skull through the vertex without intravenous contrast.  COMPARISON:  Brain MR dated 09/09/2013 and head CT dated 05/22/2013.  FINDINGS: The previously demonstrated 2.5 x 2.5 cm low density component of the left frontal lobe mass is smaller and less rounded without the previously associated surrounding soft tissue component. The residual low density at this location measures 2.0 x 1.7 cm on image number 27. The previously seen 2.7 x 2.3 cm right parietal occipital mass with central low density has resolved with wedge shaped low density currently seen at that location involving the gray and white matter. The previously demonstrated right temporal mass has been replaced with an area of lower density. No new masses are seen and no intracranial hemorrhage or CT evidence of acute infarction. An old left thalamic lacunar infarct is  unchanged. There is interval extensive confluent white matter low density in both cerebral hemispheres. Unremarkable bones. 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  IMPRESSION: 1. No acute abnormality. 2. Treated metastases, as described above. 3. Postradiation cerebral white matter changes.   Electronically Signed   By: Enrique Sack M.D.   On: 10/20/2013 20:58   Ct Angio Chest Pe W/cm &/or Wo Cm  09/24/2013   CLINICAL DATA:  Lung cancer with brain metastases, pneumonia, evaluate for PE  EXAM: CT ANGIOGRAPHY CHEST WITH CONTRAST  TECHNIQUE: Multidetector CT imaging of the chest was performed using the standard protocol during bolus administration of intravenous contrast. Multiplanar CT image reconstructions and MIPs were obtained to evaluate the vascular anatomy.  CONTRAST:  128mL OMNIPAQUE IOHEXOL 350 MG/ML SOLN  COMPARISON:  CT chest dated 06/19/2013  FINDINGS: No evidence of pulmonary embolism.  1.4 x 1.7 cm medial left upper lobe nodule (series 12/image 34), previously 1.3 x 1.0 cm, suspicious for metastasis. Additional clustered satellite nodularity in the left upper lobe (series 12/image 28).  Multifocal patchy opacity predominantly in the right lower lobe (series 12/image 90), and to a lesser extent in the right middle lobe (series 12/ image 69) and left lower lobe (series 12/ image 86), suspicious for multifocal pneumonia.  Underlying mild centrilobular emphysematous changes. No pleural effusion or pneumothorax.  Visualized thyroid is unremarkable.  The  heart is normal in size. No pericardial effusion. Coronary atherosclerosis. Atherosclerotic calcifications of the aortic arch.  Progression of thoracic lymphadenopathy, including:  --16 mm short axis left axillary node (series 5/ image 29), previously 11 mm  --9 mm short axis subcarinal node (series 5/ image 55), previously 8 mm  --7 mm short axis right hilar node (series 5/image 56),  new  Visualized upper abdomen is notable for mild nodular thickening of the left adrenal gland (series 5/image 104), indeterminate, early metastasis not excluded.  Degenerative changes of the visualized thoracolumbar spine. Exaggerated thoracic kyphosis. Cervical spine fixation hardware.  Review of the MIP images confirms the above findings.  IMPRESSION: No evidence of pulmonary embolism.  1.7 cm medial left upper lobe nodule, previously 1.3 cm, suspicious for metastasis. Progression of associated thoracic lymphadenopathy. Possible early left adrenal metastasis, indeterminate.  Multifocal pneumonia, right lower lobe predominant.   Electronically Signed   By: Julian Hy M.D.   On: 09/24/2013 23:07         Subjective: Patient seen, he is calmer today. MRI brain could not be done due to agitation. He required one dose of ativan last night.  Objective: Filed Vitals:   10/26/13 0200 10/26/13 0400 10/26/13 0800 10/26/13 1000  BP: 140/68 116/47 116/58   Pulse: 88 81    Temp:      TempSrc:      Resp: 19 19 19    Height:      Weight:      SpO2: 100% 99%  100%    Intake/Output Summary (Last 24 hours) at 10/26/13 1110 Last data filed at 10/26/13 1000  Gross per 24 hour  Intake   1525 ml  Output    225 ml  Net   1300 ml   Weight change:  Exam:  Physical Exam: Head: Normocephalic, atraumatic.  Neck: supple,No deformities, masses, or tenderness noted. Lungs: Normal respiratory effort. Clear bilaterally Heart: Regular RR. S1 and S2 normal  Abdomen: BS normoactive. Soft, Nondistended, non-tender.  Extremities: No pretibial edema, no erythema Neuro : alert, not oriented x 3,    Data Reviewed: Basic Metabolic Panel:  Recent Labs Lab 10/20/13 2330  10/21/13 1240 10/22/13 0325 10/23/13 0332 10/24/13 0335 10/25/13 0355  NA  --   < > 133* 133* 134* 135* 135*  K  --   < > 5.6* 4.0 3.0* 3.1* 3.5*  CL  --   < > 99 98 97 96 96  CO2  --   < > 23 23 27 27 26   GLUCOSE  --   < > 78  135* 123* 122* 121*  BUN  --   < > 5* 7 4* 4* 6  CREATININE  --   < > 0.56 0.55 0.49* 0.52 0.60  CALCIUM  --   < > 8.6 8.3* 8.2* 8.5 8.9  MG 1.5  --   --   --   --   --   --   < > = values in this interval not displayed. Liver Function Tests:  Recent Labs Lab 10/20/13 1507 10/21/13 0220  AST 12 15  ALT 9 9  ALKPHOS 101 96  BILITOT 0.6 0.5  PROT 6.4 6.3  ALBUMIN 3.0* 2.9*   No results found for this basename: LIPASE, AMYLASE,  in the last 168 hours  Recent Labs Lab 10/21/13 1318  AMMONIA 59   CBC:  Recent Labs Lab 10/20/13 1507 10/21/13 0220 10/21/13 0645 10/25/13 0355  WBC 5.2 7.4 6.1 9.1  HGB  12.4* 13.8 13.9 13.8  HCT 37.1* 40.4 42.7 40.1  MCV 89.4 89.0 90.1 86.8  PLT 256 268 244 219   Cardiac Enzymes: No results found for this basename: CKTOTAL, CKMB, CKMBINDEX, TROPONINI,  in the last 168 hours BNP: No components found with this basename: POCBNP,  CBG:  Recent Labs Lab 10/25/13 1205 10/25/13 1700 10/26/13 0034 10/26/13 0441 10/26/13 0824  GLUCAP 118* 108* 98 85 77    Recent Results (from the past 240 hour(s))  MRSA PCR SCREENING     Status: None   Collection Time    10/20/13 10:38 PM      Result Value Ref Range Status   MRSA by PCR NEGATIVE  NEGATIVE Final   Comment:            The GeneXpert MRSA Assay (FDA     approved for NASAL specimens     only), is one component of a     comprehensive MRSA colonization     surveillance program. It is not     intended to diagnose MRSA     infection nor to guide or     monitor treatment for     MRSA infections.  URINE CULTURE     Status: None   Collection Time    10/21/13  4:30 PM      Result Value Ref Range Status   Specimen Description URINE, CLEAN CATCH   Final   Special Requests NONE   Final   Culture  Setup Time     Final   Value: 10/22/2013 02:07     Performed at [facility]     Final   Value: 30,000 COLONIES/ML     Performed at [facility]   Culture      Final   Value: Multiple bacterial morphotypes present, none predominant. Suggest appropriate recollection if clinically indicated.     Performed at [facility]   Report Status 10/22/2013 FINAL   Final     Scheduled Meds: . antiseptic oral rinse  7 mL Mouth Rinse q12n4p  . chlorhexidine  15 mL Mouth Rinse BID  . enoxaparin (LOVENOX) injection  40 mg Subcutaneous QHS  . feeding supplement (ENSURE COMPLETE)  237 mL Oral QHS  . levETIRAcetam  500 mg Intravenous BID  . metoprolol  2.5 mg Intravenous 4 times per day   Continuous Infusions: . sodium chloride 50 mL/hr at 10/25/13 1711     [facility] S, MD [facility] Pager [phone]  Time spent 25 minutes  If 7PM-7AM, please contact night-coverage [email or web address] Password [email or web address] 10/26/2013, 11:10 AM   LOS: 6 days

## 2013-10-26 NOTE — Progress Notes (Signed)
Peripherally Inserted Central Catheter/Midline Placement  The IV Nurse has discussed with the patient and/or persons authorized to consent for the patient, the purpose of this procedure and the potential benefits and risks involved with this procedure.  The benefits include less needle sticks, lab draws from the catheter and patient may be discharged home with the catheter.  Risks include, but not limited to, infection, bleeding, blood clot (thrombus formation), and puncture of an artery; nerve damage and irregular heat beat.  Alternatives to this procedure were also discussed.  Consent obtained from wife at bedside.  PICC/Midline Placement Documentation  PICC / Midline Single Lumen 18/33/58 PICC Right Basilic 36 cm 0 cm (Active)  Indication for Insertion or Continuance of Line Home intravenous therapies (PICC only) 10/26/2013  5:21 PM  Exposed Catheter (cm) 0 cm 10/26/2013  5:21 PM  Site Assessment Clean;Dry;Intact 10/26/2013  5:21 PM  Line Status Flushed;Saline locked;Blood return noted 10/26/2013  5:21 PM  Dressing Type Transparent 10/26/2013  5:21 PM  Dressing Status Clean;Dry;Intact;Antimicrobial disc in place 10/26/2013  5:21 PM  Line Care Connections checked and tightened 10/26/2013  5:21 PM  Line Adjustment (NICU/IV Team Only) No 10/26/2013  5:21 PM  Dressing Intervention New dressing 10/26/2013  5:21 PM  Dressing Change Due 11/02/13 10/26/2013  5:21 PM       Rolena Infante 10/26/2013, 5:22 PM

## 2013-10-26 NOTE — Progress Notes (Signed)
Patient extremely congested -using a small catheter suction without (nasrotracheal suctioning) I performed deep laryngeal suction and pulled up a large amount of thick white material. Patient has significant relief and began to move air more freely. I suspect he has significant thrush in his mouth. Will give a dose of IV dilfucan X1 and use MMW. He is pobably going to need a PICC line- current IV access is fragile- he may also need IV Keppra and comfort meds after transport to hospice facility.  Frank Hacker, DO Palliative Medicine [phone]

## 2013-10-27 DIAGNOSIS — Z66 Do not resuscitate: Secondary | ICD-10-CM

## 2013-10-27 MED ORDER — ATROPINE SULFATE 1 % OP SOLN
2.0000 [drp] | OPHTHALMIC | Status: AC | PRN
Start: 2013-10-27 — End: ?

## 2013-10-27 MED ORDER — LORAZEPAM 2 MG/ML IJ SOLN: 0.5000 mg | INTRAMUSCULAR | Status: AC

## 2013-10-27 MED ORDER — HYDROMORPHONE HCL 1 MG/ML IJ SOLN: 0.5000 mg | INTRAMUSCULAR | Status: AC | PRN

## 2013-10-27 MED ORDER — LEVETIRACETAM IN NACL 500 MG/100ML IV SOLN: 500.0000 mg | Freq: Two times a day (BID) | INTRAVENOUS | Status: AC

## 2013-10-27 MED ORDER — IPRATROPIUM-ALBUTEROL 0.5-2.5 (3) MG/3ML IN SOLN: 3.0000 mL | RESPIRATORY_TRACT | Status: AC

## 2013-10-27 MED ORDER — SCOPOLAMINE 1 MG/3DAYS TD PT72: 1.0000 | MEDICATED_PATCH | TRANSDERMAL | Status: AC

## 2013-10-27 NOTE — Progress Notes (Signed)
10/27/13 [place] to give report. Report given to [facility]. [phone]

## 2013-10-27 NOTE — Progress Notes (Signed)
Pt for discharge to [facility].  CSW facilitated pt discharge needs including discussing with [facility] liaison, Erling Conte, discussing with pt family at bedside, providing RN phone number to call report, and arranging ambulance transport via [place] for pt to [facility].  Pt family coping as well as can be anticipated with anticipation of transition to [facility]. Pt wife, pt son, and pt sister-in-law present at bedside to be a support to pt during transition to [facility].  No further social work needs identified at this time.  CSW signing off.   Alison Murray, MSW, [place] [facility] [phone]

## 2013-10-27 NOTE — Discharge Summary (Signed)
Physician Discharge Summary  [name] LGX:[id number] DOB: July 10, 1950 DOA: 10/20/2013  PCP: Nyoka Cowden, MD  Admit date: 10/20/2013 Discharge date: 10/27/2013  Time spent: *50 minutes  Recommendations for Outpatient Follow-up:  1. *Follow up physician at [facility]  Discharge Diagnoses:  Principal Problem:   Seizure Active Problems:   History of cardiovascular disorder   Non-small cell carcinoma of left lung, stage 4   Hypokalemia   Diarrhea   Acute encephalopathy   COPD bronchitis   DNR (do not resuscitate)   Palliative care status   Discharge Condition: Stable  Diet recommendation: *Comfort feeds  Filed Weights   10/20/13 2235 10/22/13 0058 10/23/13 0418  Weight: 47.7 kg (105 lb 2.6 oz) 47.7 kg (105 lb 2.6 oz) 47.3 kg (104 lb 4.4 oz)    History of present illness:  63 y.o. male with history of metastatic non-small cell lung cancer who was on chemotherapy previously and currently under observation and has had received brain radiation and was recently admitted for pneumonia and was on antibiotics was brought to the ER by patient's family as patient has been having diarrhea for last 2 days with dark stools. Patient did not have any nausea vomiting or abdominal pain. In the ER hemoglobin was around 12 and stool for blood was negative. While waiting in the ER patient had brief episode of tonic-clonic seizures lasted for around 35 seconds. Was witnessed by the patient's nurse. Patient's seizure stopped spontaneously before any medication was given. CT head did not show anything acute. On-call neurologist was consulted by the ED physician and at this time patient was brought on [place] and admitted for further management. After the seizure patient was unconscious for a few minutes later on patient regained consciousness but was confused and agitated and was given Ativan. Patient presently is post ictal.    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 The patient was recently discharged from the hospital on 09/27/2013 and finished antibiotics for CAP. C. difficile PCR has been ordered. While in the emergency department, the patient had a tonic-clonic seizure that was witnessed by RN which spontaneously resolved without any medication. However, the patient was confused and agitated in his post ictal state, and the patient was given Ativan for agitation. the patient has not had any seizure activity since that time. However, he remains encephalopathic.   Acute encephalopathy/delirium  Patient continues to be agitated, likely multifactorial from the onset seizure, neurology has seen the patient and started on Keppra. UA is negative urine culture is negative. Urine for Legionella antigen as well as strep pneumo urinary antigen are negative. TSH is 0.566, B12 584. Pamelor has been discontinued. Will hold the ativan for now to see if it improved drowsiness, MRI attempted twice but unable to perform.  Continue IV ativan a as scheduled. He has a picc line in place. Also started on prn dilaudid.  Acute diastolic CHF  Improving with IV Lasix  Patient has elevated BNP 9249.0, Started on lasix 20 mg IV q 12hr. patient had good diuretic response with IV Lasix, at this time I'm going to the discontinue Lasix as patient seems to be back to baseline, lungs are clear to auscultation.   Dysphagia  Bedside evaluation done patient is too somnolent to eat so they recommend n.p.o.  status is for now.    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 EEG was done by neurology which showed generalized nonspecific cerebral dysfunction no seizure or syncopal predisposition recorded in the study. Continued Keppra.   Diarrhea   Cdiff PCR is pending. Patient completed the antibiotics for community-acquired pneumonia 1 month ago. Stool sample for c diff could not be obtained.  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   COPD  Continue DuoNeb nebulizers every 6 hours when necessary    Family met with palliative care, and discussed the goals of care option, and decided to send the patient to inpatient hospice for end of life care.   Procedures:  EEG  Consultations:  Neurology*  Discharge Exam: Filed Vitals:   10/27/13 0715  BP: 117/41  Pulse: 69  Temp:   Resp: 13    General: Appear in no acute distress Cardiovascular: S1s2 RRR Respiratory: *Clear bilaterally  Discharge Instructions You were cared for by a hospitalist during your hospital stay. If you have any questions about your discharge medications or the care you received while you were in the hospital after you are discharged, you can call the unit and asked to speak with the hospitalist on call if the hospitalist that took care of you is not available. Once you are discharged, your primary care physician will handle any further medical issues. Please note that NO REFILLS for any discharge medications will be authorized once you are discharged, as it is imperative that you return to your primary care physician (or establish a relationship with a primary care physician if you do not have one) for your aftercare needs so that they can reassess your need for medications and monitor your lab values.  Discharge Instructions   Diet - low sodium heart healthy    Complete by:  As directed      Increase activity slowly    Complete by:  As directed           Current Discharge Medication List    START taking these medications   Details  atropine 1 % ophthalmic solution Place 2-4 drops under the tongue every 2 (two) hours as needed (upper airway secretions). Qty: 2 mL, Refills: 12     HYDROmorphone (DILAUDID) 1 MG/ML injection Inject 0.5 mLs (0.5 mg total) into the vein every 2 (two) hours as needed for moderate pain or severe pain (Dyspnea, Respirations >25, Distress). Qty: 1 mL, Refills: 0    ipratropium-albuterol (DUONEB) 0.5-2.5 (3) MG/3ML SOLN Take 3 mLs by nebulization every 4 (four) hours. Qty: 360 mL, Refills: 2    levETIRAcetam (KEPRRA) 500 MG/100ML SOLN Inject 100 mLs (500 mg total) into the vein 2 (two) times daily. Qty: 4000 mL    LORazepam (ATIVAN) 2 MG/ML injection Inject 0.25 mLs (0.5 mg total) into the vein every 4 (four) hours. Qty: 1 mL, Refills: 0    scopolamine (TRANSDERM-SCOP) 1 MG/3DAYS Place 1 patch (1.5 mg total) onto the skin every 3 (three) days. Qty: 10 patch, Refills: 12      CONTINUE these medications which have NOT CHANGED   Details  albuterol (PROVENTIL HFA;VENTOLIN HFA) 108 (90 BASE) MCG/ACT inhaler Inhale 2 puffs into the lungs every 4 (four) hours as needed for wheezing or shortness of breath.    gabapentin (NEURONTIN) 600 MG tablet Take 600 mg by mouth daily with breakfast.    nortriptyline (PAMELOR) 75  MG capsule Take 75 mg by mouth at bedtime.    ondansetron (ZOFRAN-ODT) 4 MG disintegrating tablet Take 4 mg by mouth every 4 (four) hours as needed for nausea or vomiting.      STOP taking these medications     aspirin 81 MG tablet      atorvastatin (LIPITOR) 20 MG tablet      pneumococcal 13-valent conjugate vaccine (PREVNAR 13) SUSP injection      tamsulosin (FLOMAX) 0.4 MG CAPS capsule        Allergies  Allergen Reactions  . Morphine And Related Other (See Comments)    Patient's wife does not want morphine given due her belief that morphine causes death- she agrees to all other opiates for symptom control- education provided.      The results of significant diagnostics from this hospitalization (including imaging, microbiology, ancillary and laboratory) are listed below for reference.    Significant Diagnostic  Studies: Dg Chest 2 View  10/13/2013   CLINICAL DATA:  Weakness, currently being treated for pneumonia; on therapy for metastatic lung malignancy, history of COPD and previous tobacco use  EXAM: CHEST  2 VIEW  COMPARISON:  PA and lateral chest x-ray and CT scan of the chest dated September 24, 2013.  FINDINGS: The lungs are hyperinflated with hemidiaphragm flattening. There is no pleural effusion or pneumothorax. There is stable parenchymal density medially in the left upper lobe consistent with known malignancy. The heart and mediastinal structures are unremarkable. The bony thorax exhibits no acute abnormalities.  IMPRESSION: COPD without evidence of pneumonia. There is a stable parenchymal mass in the medial aspect of the left upper lobe.   Electronically Signed   By: David  Martinique   On: 10/13/2013 15:01   Ct Head Wo Contrast  10/20/2013   CLINICAL DATA:  Altered mental status following a seizure. No history of seizures. Completed whole brain radiation therapy on 06/16/2013 for lung cancer with brain metastases.  EXAM: CT HEAD WITHOUT CONTRAST  TECHNIQUE: Contiguous axial images were obtained from the base of the skull through the vertex without intravenous contrast.  COMPARISON:  Brain MR dated 09/09/2013 and head CT dated 05/22/2013.  FINDINGS: The previously demonstrated 2.5 x 2.5 cm low density component of the left frontal lobe mass is smaller and less rounded without the previously associated surrounding soft tissue component. The residual low density at this location measures 2.0 x 1.7 cm on image number 27. The previously seen 2.7 x 2.3 cm right parietal occipital mass with central low density has resolved with wedge shaped low density currently seen at that location involving the gray and white matter. The previously demonstrated right temporal mass has been replaced with an area of lower density. No new masses are seen and no intracranial hemorrhage or CT evidence of acute infarction. An old left  thalamic lacunar infarct is unchanged. There is interval extensive confluent white matter low density in both cerebral hemispheres. Unremarkable bones. 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  IMPRESSION: 1. No acute abnormality. 2. Treated metastases, as described above. 3. Postradiation cerebral white matter changes.   Electronically Signed   By: Enrique Sack M.D.   On: 10/20/2013 20:58   Dg Chest Port 1 View  10/21/2013   CLINICAL DATA:  63 year old male with known history of lung cancer presenting with cough and confusion.  EXAM: PORTABLE CHEST - 1 VIEW  COMPARISON:  Chest x-ray 10/13/2013.  FINDINGS: Left upper lobe pulmonary nodule again noted,  estimated to measure approximately 2.9 x 1.7 cm. No consolidative airspace disease. No pleural effusions. Mild diffuse interstitial prominence and peribronchial cuffing. No evidence of pulmonary edema. Heart size is normal. Upper mediastinal contours are slightly distorted by patient's rotation to the left, but appear within normal limits. Atherosclerosis in the thoracic aorta.  IMPRESSION: 1. Mild diffuse peribronchial cuffing and interstitial prominence may suggest bronchitis. 2. Left upper lobe pulmonary nodule appears similar to recent prior examinations. 3. Atherosclerosis.   Electronically Signed   By: Vinnie Langton M.D.   On: 10/21/2013 19:03   Dg Chest Port 1v Same Day  10/24/2013   CLINICAL DATA:  Dyspnea.  EXAM: PORTABLE CHEST - 1 VIEW SAME DAY  COMPARISON:  10/21/2013 and chest CT 09/24/2013  FINDINGS: The lungs are somewhat hyperexpanded without focal consolidation or effusion. The stable left suprahilar nodular opacity as seen on recent CT. Cardiomediastinal silhouette and remainder of the exam is unchanged to include calcified plaque over the thoracic aorta and degenerative changes of the spine.  IMPRESSION: No active disease.  Stable left suprahilar nodular opacity.    Electronically Signed   By: Marin Olp M.D.   On: 10/24/2013 10:20   Mr Attempted Daymon Larsen Report  10/22/2013   This examination belongs to an outside facility and is stored  here for comparison purposes only.  Contact the originating outside  institution for any associated report or interpretation.   Microbiology: Recent Results (from the past 240 hour(s))  MRSA PCR SCREENING     Status: None   Collection Time    10/20/13 10:38 PM      Result Value Ref Range Status   MRSA by PCR NEGATIVE  NEGATIVE Final   Comment:            The GeneXpert MRSA Assay (FDA     approved for NASAL specimens     only), is one component of a     comprehensive MRSA colonization     surveillance program. It is not     intended to diagnose MRSA     infection nor to guide or     monitor treatment for     MRSA infections.  URINE CULTURE     Status: None   Collection Time    10/21/13  4:30 PM      Result Value Ref Range Status   Specimen Description URINE, CLEAN CATCH   Final   Special Requests NONE   Final   Culture  Setup Time     Final   Value: 10/22/2013 02:07     Performed at [facility]     Final   Value: 30,000 COLONIES/ML     Performed at [facility]   Culture     Final   Value: Multiple bacterial morphotypes present, none predominant. Suggest appropriate recollection if clinically indicated.     Performed at [facility]   Report Status 10/22/2013 FINAL   Final     Labs: Basic Metabolic Panel:  Recent Labs Lab 10/20/13 2330  10/21/13 1240 10/22/13 0325 10/23/13 0332 10/24/13 0335 10/25/13 0355  NA  --   < > 133* 133* 134* 135* 135*  K  --   < > 5.6* 4.0 3.0* 3.1* 3.5*  CL  --   < > 99 98 97 96 96  CO2  --   < > 23 23 27 27 26   GLUCOSE  --   < > 78 135* 123* 122*  121*  BUN  --   < > 5* 7 4* 4* 6  CREATININE  --   < > 0.56 0.55 0.49* 0.52 0.60  CALCIUM  --   < > 8.6 8.3* 8.2* 8.5 8.9  MG 1.5  --   --   --   --   --   --   < > =  values in this interval not displayed. Liver Function Tests:  Recent Labs Lab 10/20/13 1507 10/21/13 0220  AST 12 15  ALT 9 9  ALKPHOS 101 96  BILITOT 0.6 0.5  PROT 6.4 6.3  ALBUMIN 3.0* 2.9*   No results found for this basename: LIPASE, AMYLASE,  in the last 168 hours  Recent Labs Lab 10/21/13 1318  AMMONIA 59   CBC:  Recent Labs Lab 10/20/13 1507 10/21/13 0220 10/21/13 0645 10/25/13 0355  WBC 5.2 7.4 6.1 9.1  HGB 12.4* 13.8 13.9 13.8  HCT 37.1* 40.4 42.7 40.1  MCV 89.4 89.0 90.1 86.8  PLT 256 268 244 219   Cardiac Enzymes: No results found for this basename: CKTOTAL, CKMB, CKMBINDEX, TROPONINI,  in the last 168 hours BNP: BNP (last 3 results)  Recent Labs  10/22/13 0325  PROBNP 9249.0*   CBG:  Recent Labs Lab 10/26/13 0824 10/26/13 1223 10/26/13 1316 10/26/13 1653 10/26/13 1818  GLUCAP 77 65* 163* 57* 151*       Signed:  Marquet Faircloth S  [facility] 10/27/2013, 7:45 AM

## 2013-10-31 ENCOUNTER — Encounter: Payer: Self-pay | Admitting: Family

## 2013-10-31 ENCOUNTER — Inpatient Hospital Stay (HOSPITAL_COMMUNITY): Payer: Medicare Other

## 2013-11-03 ENCOUNTER — Other Ambulatory Visit (HOSPITAL_COMMUNITY): Payer: Medicare Other

## 2013-11-03 ENCOUNTER — Encounter (HOSPITAL_COMMUNITY): Payer: Medicare Other

## 2013-11-03 ENCOUNTER — Ambulatory Visit: Payer: Medicare Other | Admitting: Family

## 2013-11-16 DEATH — deceased

## 2013-12-18 ENCOUNTER — Ambulatory Visit (HOSPITAL_COMMUNITY): Admit: 2013-12-18 | Payer: Medicare Other

## 2013-12-18 ENCOUNTER — Telehealth: Payer: Self-pay | Admitting: *Deleted

## 2013-12-18 ENCOUNTER — Other Ambulatory Visit: Payer: Medicare Other

## 2013-12-18 NOTE — Telephone Encounter (Signed)
Patient was scheduled to see Dr. Lisbeth Renshaw this 12/22/13, but checked last note of patient, he was transferred to [facility] 10/27/13, called and spoke with Nira Conn, she stated "he did pass away on 11/02/13, and apologized that we were not notified, but no notice of him being at the cancer center, thanked [place] for the notification, will inform MD [facility] and Dr. Julien Nordmann, cancelled follow up appt 10:34 AM

## 2013-12-22 ENCOUNTER — Ambulatory Visit: Admission: RE | Admit: 2013-12-22 | Payer: Medicare Other | Source: Ambulatory Visit | Admitting: Radiation Oncology

## 2013-12-23 ENCOUNTER — Inpatient Hospital Stay: Admission: RE | Admit: 2013-12-23 | Payer: Medicare Other | Source: Ambulatory Visit

## 2013-12-25 ENCOUNTER — Encounter (HOSPITAL_COMMUNITY): Payer: Self-pay | Admitting: Cardiovascular Disease

## 2015-04-02 ENCOUNTER — Other Ambulatory Visit: Payer: Self-pay | Admitting: Nurse Practitioner

## 2015-04-02 IMAGING — CR DG CHEST 2V
2 series · 2 of 2 positions shown · non-contrast
Comparison: PA and lateral chest x-ray and CT scan of the chest
dated September 24, 2013.

CLINICAL DATA: Weakness, currently being treated for pneumonia; on
therapy for metastatic lung malignancy, history of COPD and previous
tobacco use

EXAM:
CHEST  2 VIEW

[w chest ap]
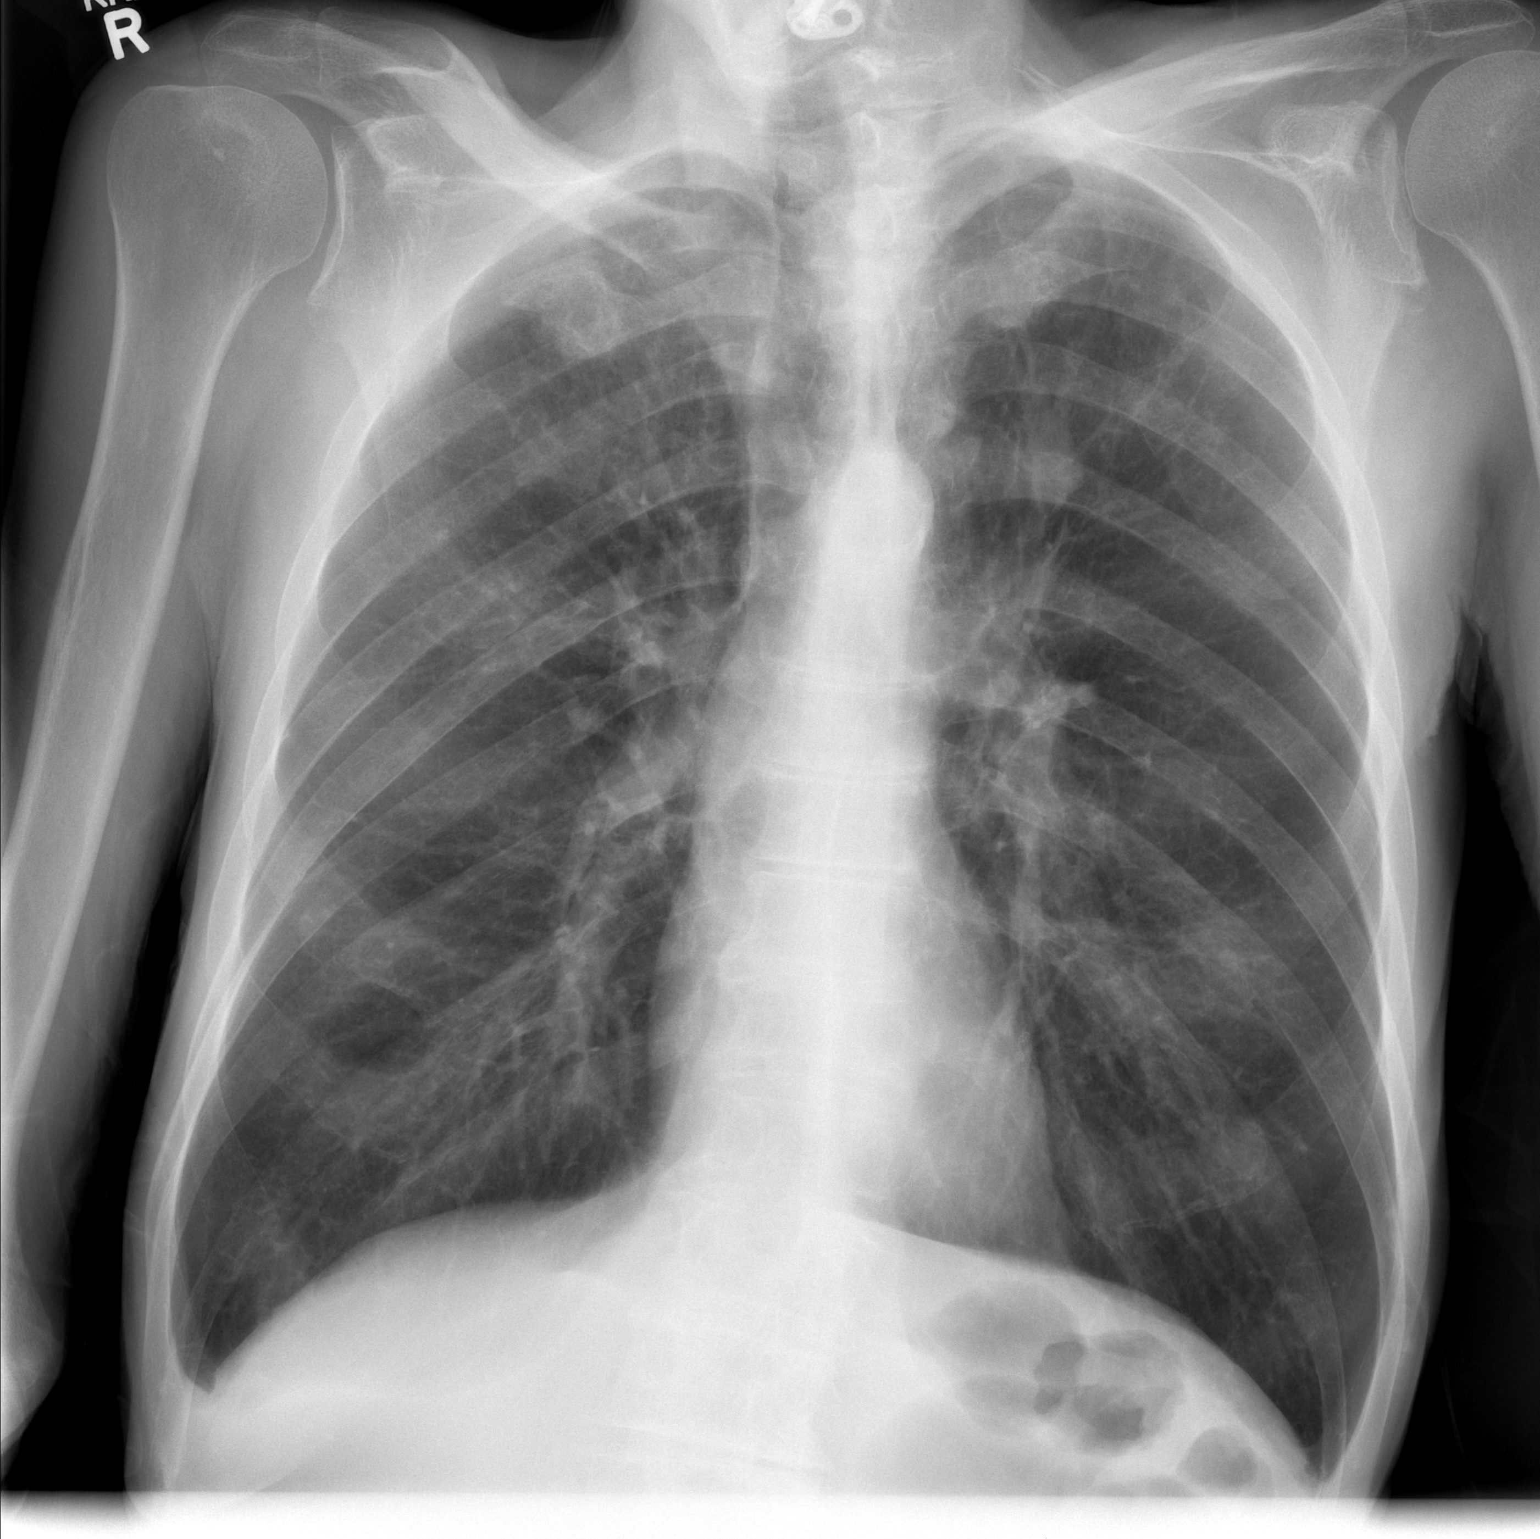

[w chest lat]
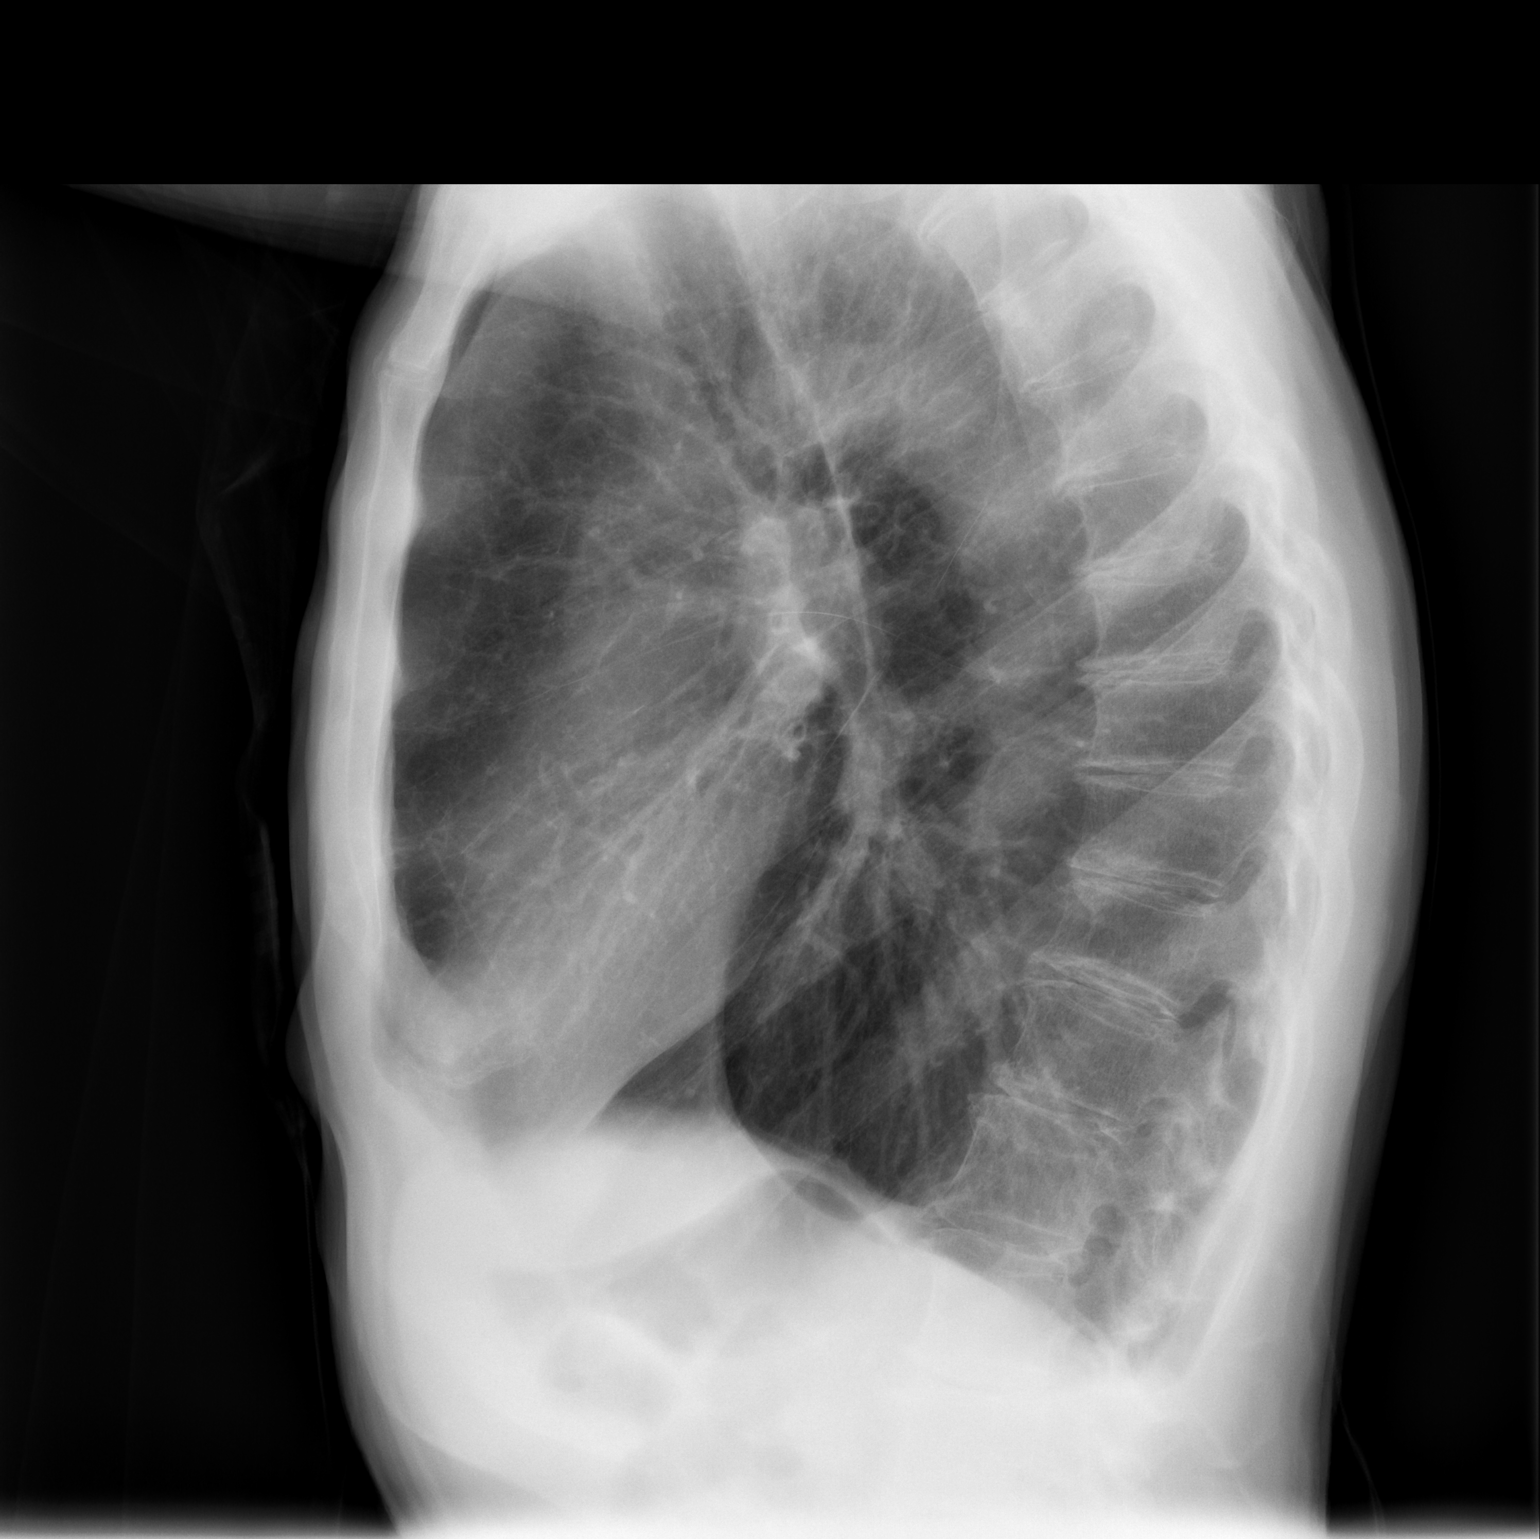

[2 of 2 positions shown; findings below may reference images not displayed]

FINDINGS: The lungs are hyperinflated with hemidiaphragm flattening. There is
no pleural effusion or pneumothorax. There is stable parenchymal
density medially in the left upper lobe consistent with known
malignancy. The heart and mediastinal structures are unremarkable.
The bony thorax exhibits no acute abnormalities.
IMPRESSION: COPD without evidence of pneumonia. There is a stable parenchymal
mass in the medial aspect of the left upper lobe.

## 2015-04-09 IMAGING — CT CT HEAD W/O CM
2 series · 16 of 30 positions shown, 19 images · non-contrast
Comparison: Brain MR dated 09/09/2013 and head CT dated 05/22/2013.

CLINICAL DATA: Altered mental status following a seizure. No
history of seizures. Completed whole brain radiation therapy on
06/16/2013 for lung cancer with brain metastases.

EXAM:
CT HEAD WITHOUT CONTRAST
TECHNIQUE: Contiguous axial images were obtained from the base of the skull
through the vertex without intravenous contrast.

[Series 2: head w/o · axial · non-contrast · 0.45mm/px · z∈[-114,+6]mm · 9 of 31 slices shown, 12 images]
[im 4/31  brain]
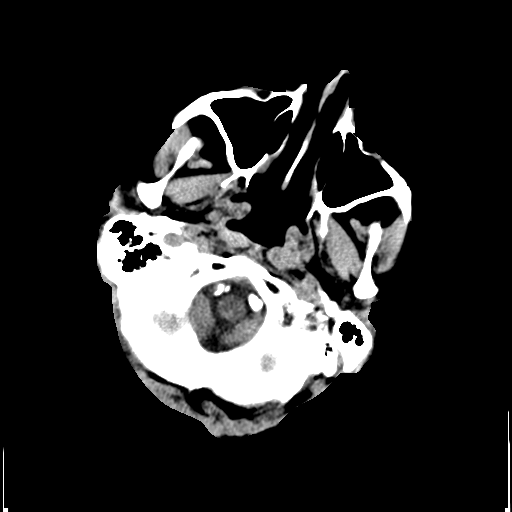
[im 4/31  bone]
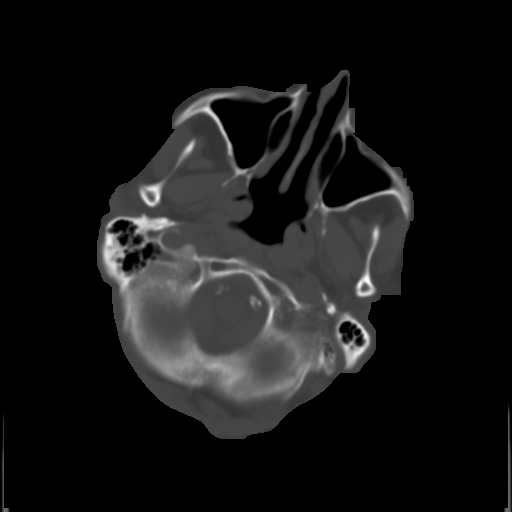
[im 7/31  brain]
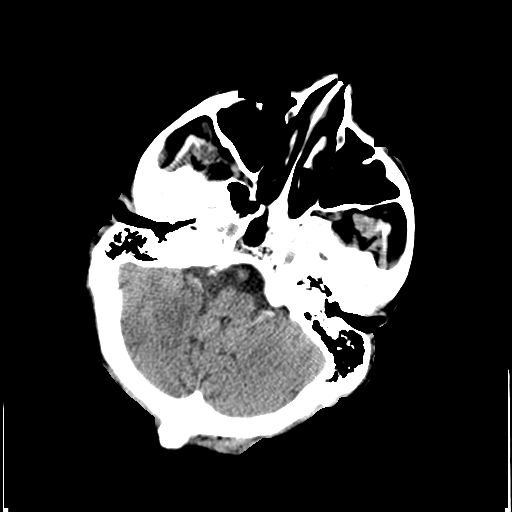
[im 10/31  brain]
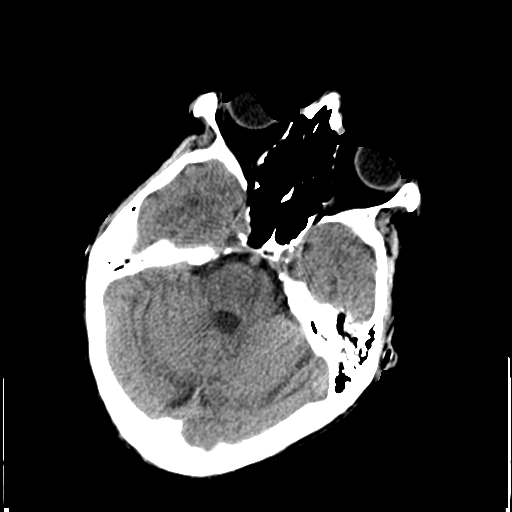
[im 13/31  brain]
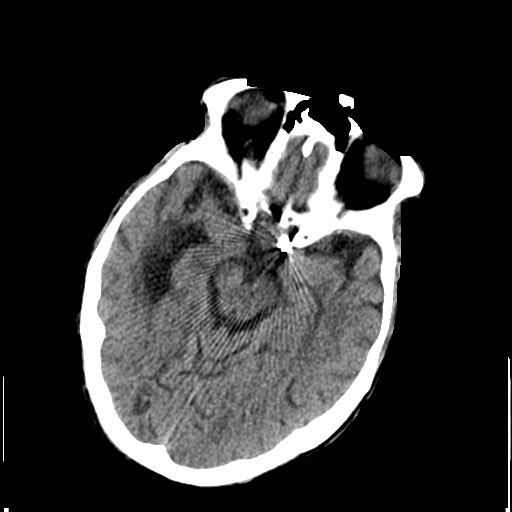
[im 16/31  brain]
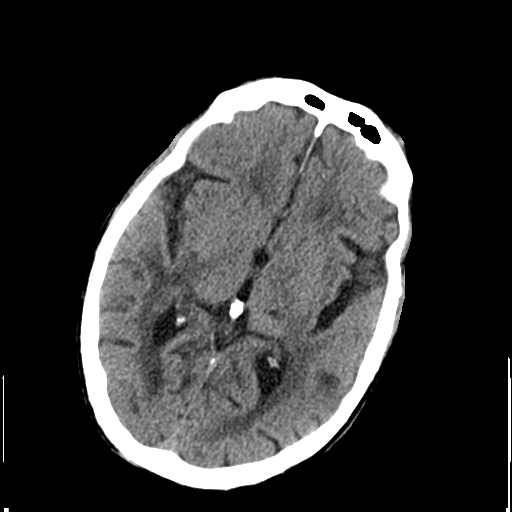
[im 16/31  bone]
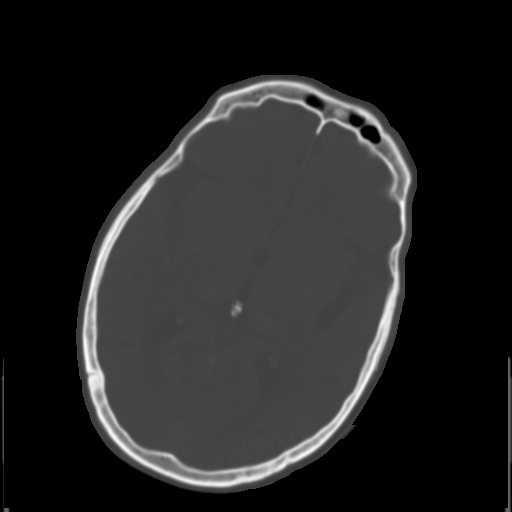
[im 19/31  brain]
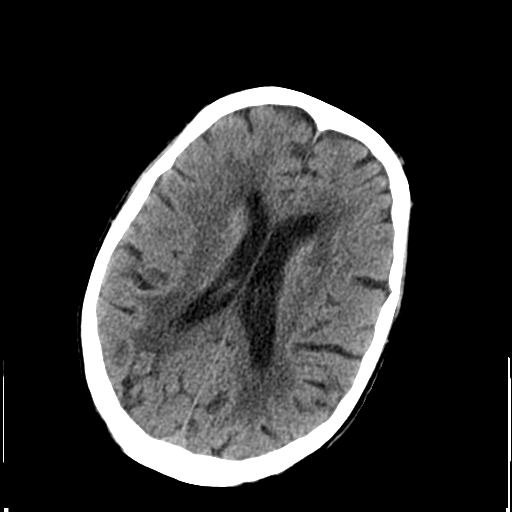
[im 22/31  brain]
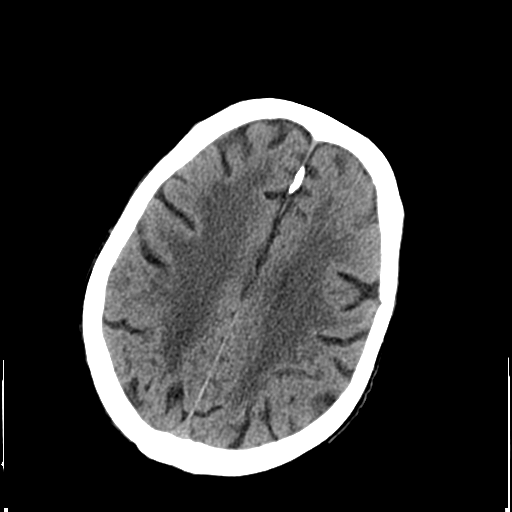
[im 25/31  brain]
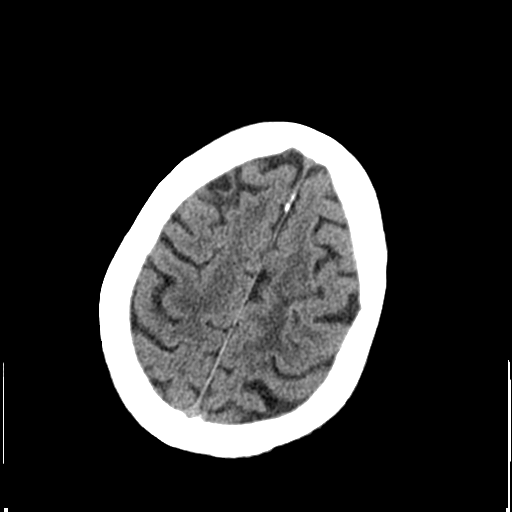
[im 28/31  brain]
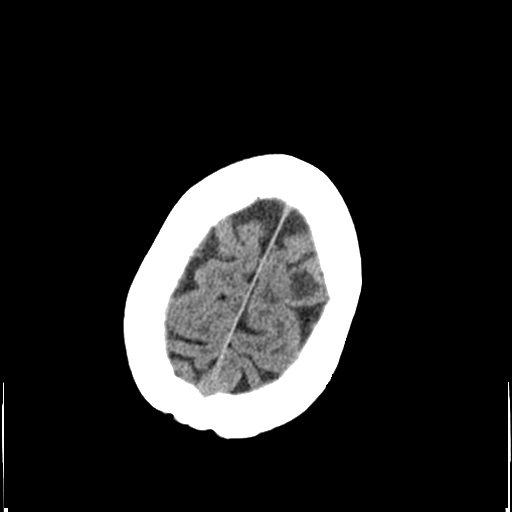
[im 28/31  bone]
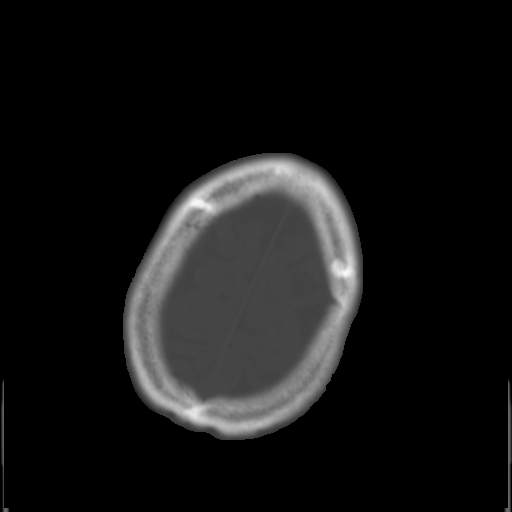

[Series 3: bone windows · axial · 0.45mm/px · z∈[-114,-15]mm · 7 of 51 slices shown]
[im 6/51  bone]
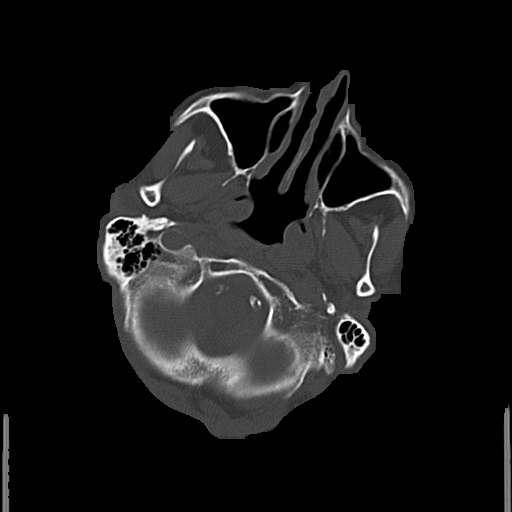
[im 12/51  bone]
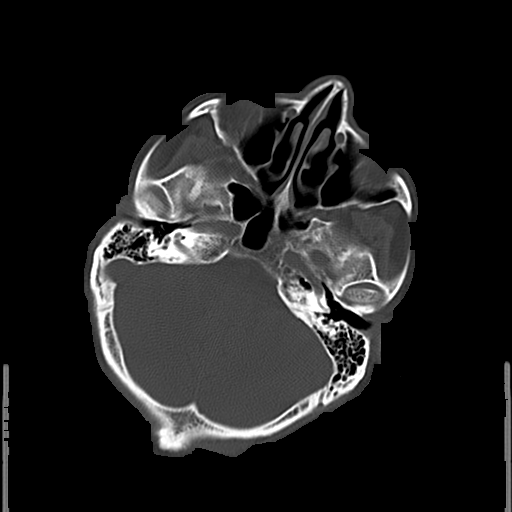
[im 17/51  bone]
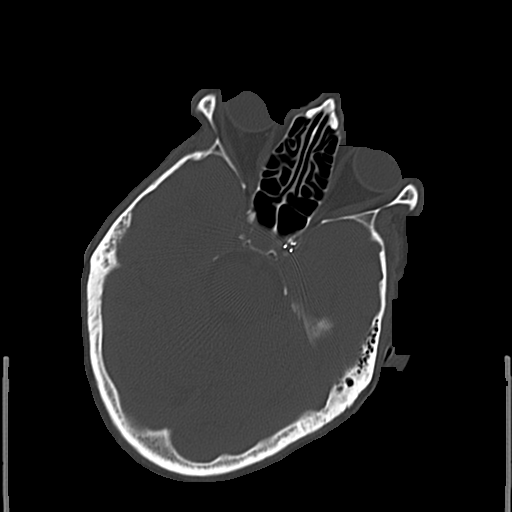
[im 23/51  bone]
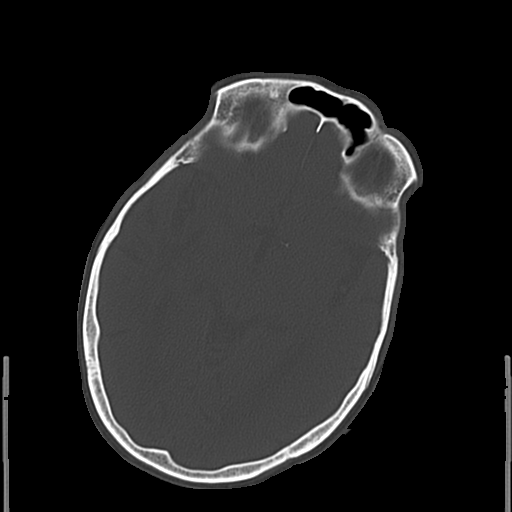
[im 28/51  bone]
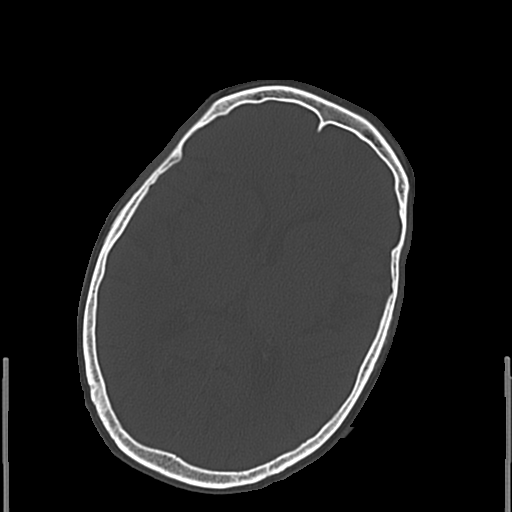
[im 34/51  bone]
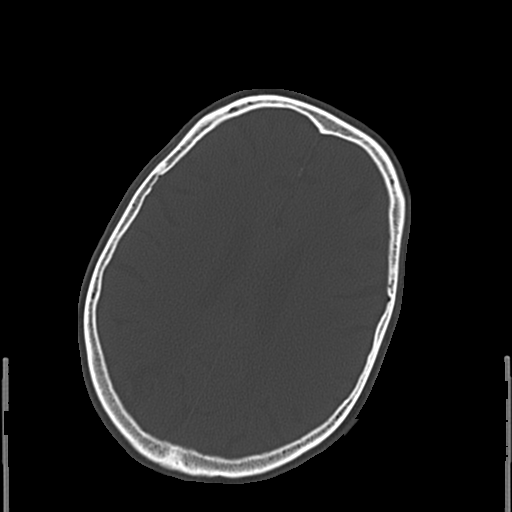
[im 39/51  bone]
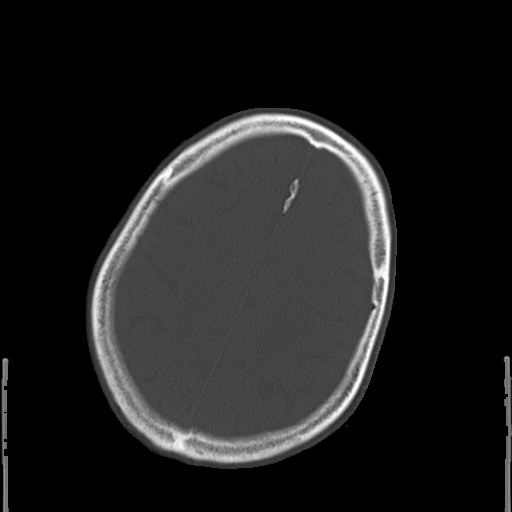

[16 of 30 positions shown; findings below may reference images not displayed]

FINDINGS: The previously demonstrated 2.5 x 2.5 cm low density component of
the left frontal lobe mass is smaller and less rounded without the
previously associated surrounding soft tissue component. The
residual low density at this location measures 2.0 x 1.7 cm on image
number 27. The previously seen 2.7 x 2.3 cm right parietal occipital
mass with central low density has resolved with wedge shaped low
density currently seen at that location involving the gray and white
matter. The previously demonstrated right temporal mass has been
replaced with an area of lower density. No new masses are seen and
no intracranial hemorrhage or CT evidence of acute infarction. An
old left thalamic lacunar infarct is unchanged. There is interval
extensive confluent white matter low density in both cerebral
hemispheres. Unremarkable bones. Supraclinoid metallic densities are
again demonstrated compatible with aneurysms previously treated
endovascularly.
IMPRESSION: 1. No acute abnormality.
2. Treated metastases, as described above.
3. Postradiation cerebral white matter changes.
